# Patient Record
Sex: Male | Born: 1955
Health system: Southern US, Community
[De-identification: ages and names within clinical notes are randomized; demographics above are authoritative.]

## PROBLEM LIST (undated history)

## (undated) DIAGNOSIS — M51369 Other intervertebral disc degeneration, lumbar region without mention of lumbar back pain or lower extremity pain: Secondary | ICD-10-CM

## (undated) DIAGNOSIS — M5136 Other intervertebral disc degeneration, lumbar region: Secondary | ICD-10-CM

## (undated) DIAGNOSIS — G4733 Obstructive sleep apnea (adult) (pediatric): Secondary | ICD-10-CM

## (undated) DIAGNOSIS — I499 Cardiac arrhythmia, unspecified: Secondary | ICD-10-CM

## (undated) DIAGNOSIS — I8290 Acute embolism and thrombosis of unspecified vein: Secondary | ICD-10-CM

## (undated) DIAGNOSIS — I4891 Unspecified atrial fibrillation: Secondary | ICD-10-CM

## (undated) DIAGNOSIS — T8859XA Other complications of anesthesia, initial encounter: Secondary | ICD-10-CM

## (undated) DIAGNOSIS — G709 Myoneural disorder, unspecified: Secondary | ICD-10-CM

## (undated) DIAGNOSIS — G4730 Sleep apnea, unspecified: Secondary | ICD-10-CM

## (undated) DIAGNOSIS — E0500 Thyrotoxicosis with diffuse goiter without thyrotoxic crisis or storm: Secondary | ICD-10-CM

## (undated) DIAGNOSIS — Z86010 Personal history of colonic polyps: Secondary | ICD-10-CM

## (undated) DIAGNOSIS — M5126 Other intervertebral disc displacement, lumbar region: Secondary | ICD-10-CM

## (undated) DIAGNOSIS — K219 Gastro-esophageal reflux disease without esophagitis: Secondary | ICD-10-CM

## (undated) DIAGNOSIS — L0390 Cellulitis, unspecified: Secondary | ICD-10-CM

## (undated) DIAGNOSIS — E119 Type 2 diabetes mellitus without complications: Secondary | ICD-10-CM

## (undated) DIAGNOSIS — I8393 Asymptomatic varicose veins of bilateral lower extremities: Secondary | ICD-10-CM

## (undated) DIAGNOSIS — E0590 Thyrotoxicosis, unspecified without thyrotoxic crisis or storm: Secondary | ICD-10-CM

## (undated) DIAGNOSIS — E785 Hyperlipidemia, unspecified: Secondary | ICD-10-CM

## (undated) DIAGNOSIS — M1990 Unspecified osteoarthritis, unspecified site: Secondary | ICD-10-CM

## (undated) DIAGNOSIS — I10 Essential (primary) hypertension: Secondary | ICD-10-CM

## (undated) DIAGNOSIS — R7303 Prediabetes: Secondary | ICD-10-CM

## (undated) DIAGNOSIS — H269 Unspecified cataract: Secondary | ICD-10-CM

## (undated) HISTORY — DX: Personal history of colonic polyps: Z86.010

## (undated) HISTORY — PX: LUMBAR EPIDURAL INJECTION: SHX1980

## (undated) HISTORY — DX: Type 2 diabetes mellitus without complications: E11.9

## (undated) HISTORY — DX: Other intervertebral disc degeneration, lumbar region without mention of lumbar back pain or lower extremity pain: M51.369

## (undated) HISTORY — DX: Unspecified atrial fibrillation: I48.91

## (undated) HISTORY — DX: Thyrotoxicosis, unspecified without thyrotoxic crisis or storm: E05.90

## (undated) HISTORY — DX: Unspecified cataract: H26.9

## (undated) HISTORY — DX: Essential (primary) hypertension: I10

## (undated) HISTORY — DX: Acute embolism and thrombosis of unspecified vein: I82.90

## (undated) HISTORY — DX: Thyrotoxicosis with diffuse goiter without thyrotoxic crisis or storm: E05.00

## (undated) HISTORY — DX: Hyperlipidemia, unspecified: E78.5

## (undated) HISTORY — DX: Obstructive sleep apnea (adult) (pediatric): G47.33

## (undated) HISTORY — DX: Cellulitis, unspecified: L03.90

## (undated) HISTORY — PX: SPINE SURGERY: SHX786

## (undated) HISTORY — DX: Unspecified osteoarthritis, unspecified site: M19.90

## (undated) HISTORY — DX: Other intervertebral disc degeneration, lumbar region: M51.36

## (undated) HISTORY — DX: Sleep apnea, unspecified: G47.30

## (undated) HISTORY — DX: Other intervertebral disc displacement, lumbar region: M51.26

---

## 1993-11-08 DIAGNOSIS — I8290 Acute embolism and thrombosis of unspecified vein: Secondary | ICD-10-CM

## 1993-11-08 HISTORY — DX: Acute embolism and thrombosis of unspecified vein: I82.90

## 1998-08-21 ENCOUNTER — Encounter: Payer: Self-pay | Admitting: Neurosurgery

## 1998-08-21 ENCOUNTER — Ambulatory Visit (HOSPITAL_COMMUNITY): Admission: RE | Admit: 1998-08-21 | Discharge: 1998-08-21 | Payer: Self-pay | Admitting: Neurosurgery

## 2001-01-29 ENCOUNTER — Ambulatory Visit (HOSPITAL_COMMUNITY): Admission: RE | Admit: 2001-01-29 | Discharge: 2001-01-29 | Payer: Self-pay | Admitting: Neurosurgery

## 2001-01-29 ENCOUNTER — Encounter: Payer: Self-pay | Admitting: Neurosurgery

## 2002-01-15 ENCOUNTER — Encounter: Admission: RE | Admit: 2002-01-15 | Discharge: 2002-01-15 | Payer: Self-pay | Admitting: Orthopaedic Surgery

## 2002-01-15 ENCOUNTER — Encounter: Payer: Self-pay | Admitting: Orthopaedic Surgery

## 2002-02-15 ENCOUNTER — Inpatient Hospital Stay (HOSPITAL_COMMUNITY): Admission: RE | Admit: 2002-02-15 | Discharge: 2002-02-22 | Payer: Self-pay | Admitting: Orthopaedic Surgery

## 2002-02-22 ENCOUNTER — Encounter: Payer: Self-pay | Admitting: *Deleted

## 2002-06-08 ENCOUNTER — Ambulatory Visit (HOSPITAL_COMMUNITY): Admission: RE | Admit: 2002-06-08 | Discharge: 2002-06-08 | Payer: Self-pay | Admitting: *Deleted

## 2002-06-08 ENCOUNTER — Emergency Department (HOSPITAL_COMMUNITY): Admission: EM | Admit: 2002-06-08 | Discharge: 2002-06-08 | Payer: Self-pay | Admitting: *Deleted

## 2002-06-08 ENCOUNTER — Encounter: Payer: Self-pay | Admitting: *Deleted

## 2002-09-28 ENCOUNTER — Encounter: Admission: RE | Admit: 2002-09-28 | Discharge: 2002-09-28 | Payer: Self-pay | Admitting: Family Medicine

## 2002-09-28 ENCOUNTER — Encounter: Payer: Self-pay | Admitting: Family Medicine

## 2003-12-26 ENCOUNTER — Encounter: Admission: RE | Admit: 2003-12-26 | Discharge: 2003-12-26 | Payer: Self-pay | Admitting: Family Medicine

## 2010-11-08 DIAGNOSIS — L0390 Cellulitis, unspecified: Secondary | ICD-10-CM

## 2010-11-08 HISTORY — DX: Cellulitis, unspecified: L03.90

## 2013-08-17 ENCOUNTER — Other Ambulatory Visit (HOSPITAL_BASED_OUTPATIENT_CLINIC_OR_DEPARTMENT_OTHER): Payer: Self-pay | Admitting: Internal Medicine

## 2013-08-17 ENCOUNTER — Ambulatory Visit (HOSPITAL_BASED_OUTPATIENT_CLINIC_OR_DEPARTMENT_OTHER)
Admission: RE | Admit: 2013-08-17 | Discharge: 2013-08-17 | Disposition: A | Discharge status: Home / Self Care | Payer: 59 | Source: Ambulatory Visit | Attending: Internal Medicine | Admitting: Internal Medicine

## 2013-08-17 DIAGNOSIS — Z87891 Personal history of nicotine dependence: Secondary | ICD-10-CM | POA: Insufficient documentation

## 2013-08-17 DIAGNOSIS — R0602 Shortness of breath: Secondary | ICD-10-CM | POA: Insufficient documentation

## 2016-10-04 ENCOUNTER — Encounter: Payer: Self-pay | Admitting: Internal Medicine

## 2016-10-04 ENCOUNTER — Ambulatory Visit (INDEPENDENT_AMBULATORY_CARE_PROVIDER_SITE_OTHER): Payer: No Typology Code available for payment source | Admitting: Internal Medicine

## 2016-10-04 DIAGNOSIS — E0500 Thyrotoxicosis with diffuse goiter without thyrotoxic crisis or storm: Secondary | ICD-10-CM | POA: Insufficient documentation

## 2016-10-04 DIAGNOSIS — E0590 Thyrotoxicosis, unspecified without thyrotoxic crisis or storm: Secondary | ICD-10-CM | POA: Diagnosis not present

## 2016-10-04 LAB — T3, FREE: T3 FREE: 5.9 pg/mL — AB (ref 2.3–4.2)

## 2016-10-04 LAB — T4, FREE: FREE T4: 1.89 ng/dL — AB (ref 0.60–1.60)

## 2016-10-04 LAB — TSH: TSH: 0.01 u[IU]/mL — AB (ref 0.35–4.50)

## 2016-10-04 MED ORDER — METHIMAZOLE 10 MG PO TABS: 10.0000 mg | ORAL_TABLET | Freq: Two times a day (BID) | ORAL | 2 refills | Status: DC

## 2016-10-04 NOTE — Patient Instructions (Addendum)
Please stop at the lab.  Please continue Methimazole 10 mg 2x a day.  Please stop the Methimazole (Tapazole) and call us or your primary care doctor if you develop: - sore throat - fever - yellow skin - dark urine - light colored stools As we will then need to check your blood counts and liver tests.  Please try to join MyChart for easier communication.  Please return in 3 months with your sugar log.    Hyperthyroidism Hyperthyroidism is when the thyroid is too active (overactive). Your thyroid is a large gland that is located in your neck. The thyroid helps to control how your body uses food (metabolism). When your thyroid is overactive, it produces too much of a hormone called thyroxine. What are the causes? Causes of hyperthyroidism may include:  Graves disease. This is when your immune system attacks the thyroid gland. This is the most common cause.  Inflammation of the thyroid gland.  Tumor in the thyroid gland or somewhere else.  Excessive use of thyroid medicines, including:  Prescription thyroid supplement.  Herbal supplements that mimic thyroid hormones.  Solid or fluid-filled lumps within your thyroid gland (thyroid nodules).  Excessive ingestion of iodine. What increases the risk?  Being male.  Having a family history of thyroid conditions. What are the signs or symptoms? Signs and symptoms of hyperthyroidism may include:  Nervousness.  Inability to tolerate heat.  Unexplained weight loss.  Diarrhea.  Change in the texture of hair or skin.  Heart skipping beats or making extra beats.  Rapid heart rate.  Loss of menstruation.  Shaky hands.  Fatigue.  Restlessness.  Increased appetite.  Sleep problems.  Enlarged thyroid gland or nodules. How is this diagnosed? Diagnosis of hyperthyroidism may include:  Medical history and physical exam.  Blood tests.  Ultrasound tests. How is this treated? Treatment may include:  Medicines  to control your thyroid.  Surgery to remove your thyroid.  Radiation therapy. Follow these instructions at home:  Take medicines only as directed by your health care provider.  Do not use any tobacco products, including cigarettes, chewing tobacco, or electronic cigarettes. If you need help quitting, ask your health care provider.  Do not exercise or do physical activity until your health care provider approves.  Keep all follow-up appointments as directed by your health care provider. This is important. Contact a health care provider if:  Your symptoms do not get better with treatment.  You have fever.  You are taking thyroid replacement medicine and you:  Have depression.  Feel mentally and physically slow.  Have weight gain. Get help right away if:  You have decreased alertness or a change in your awareness.  You have abdominal pain.  You feel dizzy.  You have a rapid heartbeat.  You have an irregular heartbeat. This information is not intended to replace advice given to you by your health care provider. Make sure you discuss any questions you have with your health care provider. Document Released: 10/25/2005 Document Revised: 03/25/2016 Document Reviewed: 03/12/2014 Elsevier Interactive Patient Education  2017 [employer].

## 2016-10-04 NOTE — Progress Notes (Signed)
Patient ID: Jeffrey Price, male   DOB: [birth year], 60 y.o.   MRN: [id number]    HPI  Jeffrey Price is a 60 y.o.-year-old male, referred by his PCP, Dr. Antonietta Jewel ([place], [place]), for evaluation for thyrotoxicosis.  Pt describes he started to lose weight ~3 mo ago. He had insomnia, increased sweating, fatigue, joint pain. He went for an APE and a TSH was undetectable. He returned after a week and a TSH was again undetectable while the fT4 was high.   I reviewed pt's thyroid tests: 09/23/2016: TSH <0.01, fT4 3.4 (0.8-1.8) 09/17/2016: TSH <0.01   He was started on MMI 10 mg 2x a day on 09/24/2016. He was on Atenolol before for HTN, now increased to 100 mg daily. He is now starting to feel better.   Pt denies feeling nodules in neck, hoarseness, dysphagia/odynophagia, SOB with lying down; he c/o: - + fatigue - + excessive sweating/heat intolerance - + aches and pains - + leg swelling - + tremors - + anxiety - + palpitations - no hyperdefecation - + weight loss - 20-25 lbs in 3 mo  Pt does have a FH of thyroid ds.: mother - Graves ds (had RAI tx). No FH of thyroid cancer. No h/o radiation tx to head or neck.  No seaweed or kelp, no recent contrast studies. No steroid use. No herbal supplements. No Biotin use.  Pt. also has a history of HTN.  ROS: Constitutional: + see HPI, + poor sleep Eyes: no blurry vision, no xerophthalmia ENT: no sore throat, no nodules palpated in throat, no dysphagia/odynophagia, + hoarseness Cardiovascular: no CP/SOB/+ palpitations/+ leg swelling Respiratory: no cough/SOB Gastrointestinal: no N/V/D/C Musculoskeletal: + muscle/+ joint aches Skin: no rashes Neurological: + tremors/no numbness/tingling/dizziness Psychiatric: no depression/+anxiety + diff with erections, + low libido  Past Medical History:  Diagnosis Date  . Thyroid disease    HTN  No past surgical history on file.   Social History   Social History  . Marital status: widowed     Spouse name: N/A  . Number of children: 1   Occupational History  . Truck driver   Social History Main Topics  . Smoking status: Former smoker, quit 2013  . Smokeless tobacco: No  . Alcohol use Bourbon, socially, 2-3 drinks  . Drug use: No     Name Dose, Frequency               meloxicam (MOBIC) 7.5 MG tablet 7.5 mg, Daily       Summary: Take 7.5 mg by mouth daily.          Note (10/04/2016): Received from: [place]: Take 7.5 mg by mouth daily.         furosemide (LASIX) 40 MG tablet 40 mg, Daily       Summary: Take 40 mg by mouth daily.          Note (10/04/2016): Received from: [place]: Take 40 mg by mouth daily.         cyclobenzaprine (FLEXERIL) 10 MG tablet 10 mg, Daily       Summary: Take 10 mg by mouth daily.          Note (10/04/2016): Received from: [place]: Take 10 mg by mouth daily.         atenolol (TENORMIN) 25 MG tablet 25 mg, Daily       Summary: Take 25 mg by mouth daily.  Note (10/04/2016): Received from: [place]: Take 25 mg by mouth daily.         aspirin 81 MG chewable tablet 81 mg, Daily       Summary: Chew 81 mg by mouth daily.          Note (10/04/2016): Received from: [place]: Chew 81 mg by mouth daily.         ALPRAZolam (XANAX) 0.5 MG tablet 0.5 mg, Daily       Summary: Take 0.5 mg by mouth daily. Takes it at night for sleep          Note (10/04/2016): Received from: [place]: Take 0.5 mg by mouth at bedtime as needed for Sleep.         +  Methimazole 5 mg bid + Fish Oil + Zinc  No Known Allergies   Family History  Problem Relation Age of Onset  . Parkinson's disease Father   . Thyroid disease Mother    Diabetes - daughter  PE: BP (!) 142/80   Pulse 71   Wt 244 lb (110.7 kg)   SpO2 98%  Wt Readings from Last 3 Encounters:  10/04/16 244 lb (110.7 kg)   Constitutional: overweight, in  NAD Eyes: PERRLA, EOMI, no exophthalmos, no lid lag, no stare ENT: moist mucous membranes, no thyromegaly, no thyroid bruits, no cervical lymphadenopathy Cardiovascular: RRR, No MRG Respiratory: CTA B Gastrointestinal: abdomen soft, NT, ND, BS+ Musculoskeletal: no deformities, strength intact in all 4 Skin: moist, warm, no rashes Neurological: no tremor with outstretched hands, DTR normal in all 4  ASSESSMENT: 1. Thyrotoxicosis  PLAN:  1. Patient with a recently found low TSH + high fT4, with multiple thyrotoxic sxs: weight loss, heat intolerance, fatigue, tremors palpitations, anxiety. Now all have improved after starting methimazole and increasing the atenolol. In fact, on today's exam, he appears completely euthyroid. - he does not appear to have exogenous causes for the low TSH.  - We discussed that possible causes of thyrotoxicosis are:  Graves ds   Thyroiditis toxic multinodular goiter/ toxic adenoma (I cannot feel nodules at palpation of his thyroid). - I suggested that we check the TSH, fT3 and fT4 and also add thyroid stimulating antibodies to screen for Graves' disease.  - If the tests remain abnormal, we may need an uptake and scan to differentiate between the 3 above possible etiologies, although my suspicion is quite high for Graves' disease, especially since he has family history of this condition in his mother. - we discussed about possible modalities of treatment for the above conditions, to include methimazole use, radioactive iodine ablation or (last resort) surgery. - we might need to do thyroid ultrasound depending on the results of the uptake and scan (if a cold nodule is present) - Will continue his beta blocker at the current dose for now - I advised him to join my chart to communicate easier - RTC in 3 months, but likely sooner for repeat labs  Component     Latest Ref Rng & Units 10/04/2016  T4,Free(Direct)     0.60 - 1.60 ng/dL 1.89 (H)   Triiodothyronine,Free,Serum     2.3 - 4.2 pg/mL 5.9 (H)  TSH     0.35 - 4.50 uIU/mL 0.01 (L)  TSI     <140 % baseline 553 (H)   TSI's are high, indicating Graves' disease.  TFTs are still abnormal, therefore, will continue his current dose of methimazole, 10 mg twice  a day until we repeat the labs in 4 weeks. At that point, I suspect that we may be able to reduce the dose to 5 mg twice a day.  Philemon Kingdom, MD PhD [facility] Endocrinology

## 2016-10-06 LAB — THYROID STIMULATING IMMUNOGLOBULIN: TSI: 553 % baseline — ABNORMAL HIGH (ref <140)

## 2016-10-07 ENCOUNTER — Encounter: Payer: Self-pay | Admitting: Internal Medicine

## 2016-10-21 ENCOUNTER — Other Ambulatory Visit: Payer: Self-pay | Admitting: Internal Medicine

## 2016-10-21 ENCOUNTER — Encounter: Payer: Self-pay | Admitting: Internal Medicine

## 2016-10-21 ENCOUNTER — Other Ambulatory Visit (INDEPENDENT_AMBULATORY_CARE_PROVIDER_SITE_OTHER): Payer: No Typology Code available for payment source

## 2016-10-21 DIAGNOSIS — E0590 Thyrotoxicosis, unspecified without thyrotoxic crisis or storm: Secondary | ICD-10-CM

## 2016-10-21 LAB — T4, FREE: Free T4: 1.03 ng/dL (ref 0.60–1.60)

## 2016-10-21 LAB — TSH: TSH: 0.3 u[IU]/mL — ABNORMAL LOW (ref 0.35–4.50)

## 2016-10-21 LAB — T3, FREE: T3, Free: 3.9 pg/mL (ref 2.3–4.2)

## 2016-10-22 MED ORDER — METHIMAZOLE 5 MG PO TABS: 5.0000 mg | ORAL_TABLET | Freq: Two times a day (BID) | ORAL | 2 refills | Status: DC

## 2016-12-17 ENCOUNTER — Encounter: Payer: Self-pay | Admitting: Internal Medicine

## 2016-12-17 NOTE — Progress Notes (Unsigned)
Received labs from PCP from 12/09/2016: TSH 0.02, free T4 1.1 (0.8-1.8), free T3 4.2 (2.3-4.2) The free hormones are much improved, being now in the normal range. The TSH is still low, however, this lags behind the rest of the tests. I would suggest to continue the current dose of methimazole and I will repeat his tests in approximately 1 month.

## 2017-01-03 ENCOUNTER — Encounter: Payer: Self-pay | Admitting: Internal Medicine

## 2017-01-03 ENCOUNTER — Ambulatory Visit (INDEPENDENT_AMBULATORY_CARE_PROVIDER_SITE_OTHER): Payer: No Typology Code available for payment source | Admitting: Internal Medicine

## 2017-01-03 VITALS — BP 134/80 | HR 68 | Wt 257.0 lb

## 2017-01-03 DIAGNOSIS — E0500 Thyrotoxicosis with diffuse goiter without thyrotoxic crisis or storm: Secondary | ICD-10-CM | POA: Diagnosis not present

## 2017-01-03 DIAGNOSIS — E0590 Thyrotoxicosis, unspecified without thyrotoxic crisis or storm: Secondary | ICD-10-CM

## 2017-01-03 LAB — TSH: TSH: 0.18 u[IU]/mL — AB (ref 0.35–4.50)

## 2017-01-03 LAB — T3, FREE: T3 FREE: 3.3 pg/mL (ref 2.3–4.2)

## 2017-01-03 LAB — T4, FREE: FREE T4: 0.75 ng/dL (ref 0.60–1.60)

## 2017-01-03 NOTE — Patient Instructions (Signed)
Please stop at the lab.  Please continue Methimazole 5 mg 2x a day.  Please return in 4 months.  

## 2017-01-03 NOTE — Progress Notes (Signed)
Patient ID: Jeffrey Price, male   DOB: 1956-10-09, 61 y.o.   MRN: [id number]    HPI  Jeffrey Price is a 61 y.o.-year-old male, initially referred by his PCP, Dr. Antonietta Jewel ([place], [place]), now returning for f/u for thyrotoxicosis (most likely Graves ds). Last visit 3 mo ago.  Reviewed and addended hx: Pt describes he started to lose weight ~3 mo ago. He had insomnia, increased sweating, fatigue, joint pain. He went for an APE and a TSH was undetectable. He returned after a week and a TSH was again undetectable while the fT4 was high:  09/23/2016: TSH <0.01, fT4 3.4 (0.8-1.8) 09/17/2016: TSH <0.01   Before our visit in 09/2016, he was started on MMI 10 mg 2x a day on 09/24/2016. He was started on Atenolol before for HTN, but this was increased to 100 mg daily.  He started HCTZ 11/2016.  At last visit: Component     Latest Ref Rng & Units 10/04/2016  T4,Free(Direct)     0.60 - 1.60 ng/dL 1.89 (H)  Triiodothyronine,Free,Serum     2.3 - 4.2 pg/mL 5.9 (H)  TSH     0.35 - 4.50 uIU/mL 0.01 (L)  TSI     <140 % baseline 553 (H)  TSI's are high, indicating Graves' disease. We continued the same dose of MMI, 10 mg bid.  In 10/2016, tests were improving: Component     Latest Ref Rng & Units 10/21/2016  TSH     0.35 - 4.50 uIU/mL 0.30 (L)  T4,Free(Direct)     0.60 - 1.60 ng/dL 1.03  Triiodothyronine,Free,Serum     2.3 - 4.2 pg/mL 3.9  We decreased MMI to 5 mg bid.  Since then: Received labs from PCP from 12/09/2016: TSH 0.02, free T4 1.1 (0.8-1.8), free T3 4.2 (2.3-4.2) The free hormones = much improved, being now in the normal range. The TSH = still low, however, this lags behind the rest of the tests. We continued MMI 5 mg bid.  Pt denies feeling nodules in neck, hoarseness, dysphagia/odynophagia, SOB with lying down; he c/o: - + fatigue - resolved excessive sweating/heat intolerance - + leg swelling - no tremors - no anxiety - resolved palpitations - no hyperdefecation - +  weight gain - initially lost 25-30 lbs in 3 mo >> then gained 13 lbs  He started Amitriptyline for neuropathy.  Pt does have a FH of thyroid ds.: mother - Graves ds (had RAI tx). No FH of thyroid cancer. No h/o radiation tx to head or neck.  No seaweed or kelp, no recent contrast studies. No steroid use. No herbal supplements. No Biotin use.  Pt. also has a history of HTN.  ROS: Constitutional: + see HPI, no poor sleep Eyes: no blurry vision, no xerophthalmia ENT: no sore throat, no nodules palpated in throat, no dysphagia/odynophagia, + hoarseness Cardiovascular: no CP/SOB/palpitations/+ leg swelling Respiratory: no cough/SOB Gastrointestinal: no N/V/D/C Musculoskeletal: no muscle/joint aches Skin: no rashes Neurological: no tremors/+ numbness - feet /no tingling/dizziness + diff with erections, + low libido  I reviewed pt's medications, allergies, PMH, social hx, family hx, and changes were documented in the history of present illness. Otherwise, unchanged from my initial visit note.  Past Medical History:  Diagnosis Date  . Thyroid disease    HTN  No past surgical history on file.   Social History   Social History  . Marital status: widowed    Spouse name: N/A  . Number of children: 1   Occupational History  .  Truck driver   Social History Main Topics  . Smoking status: Former smoker, quit 2013  . Smokeless tobacco: No  . Alcohol use [place]bon, socially, 2-3 drinks  . Drug use: No     Name Dose, Frequency               meloxicam (MOBIC) 7.5 MG tablet 7.5 mg, Daily       Summary: Take 7.5 mg by mouth daily.          Note (10/04/2016): Received from: [place]: Take 7.5 mg by mouth daily.         furosemide (LASIX) 40 MG tablet 40 mg, Daily       Summary: Take 40 mg by mouth daily.          Note (10/04/2016): Received from: [place]: Take 40 mg by mouth daily.         cyclobenzaprine (FLEXERIL) 10 MG tablet 10 mg,  Daily       Summary: Take 10 mg by mouth daily.          Note (10/04/2016): Received from: [place]: Take 10 mg by mouth daily.         atenolol (TENORMIN) 25 MG tablet 25 mg, Daily       Summary: Take 25 mg by mouth daily.          Note (10/04/2016): Received from: [place]: Take 25 mg by mouth daily.         aspirin 81 MG chewable tablet 81 mg, Daily       Summary: Chew 81 mg by mouth daily.          Note (10/04/2016): Received from: [place]: Chew 81 mg by mouth daily.         ALPRAZolam (XANAX) 0.5 MG tablet 0.5 mg, Daily       Summary: Take 0.5 mg by mouth daily. Takes it at night for sleep          Note (10/04/2016): Received from: [place]: Take 0.5 mg by mouth at bedtime as needed for Sleep.         +  Methimazole 5 mg bid + Fish Oil + Zinc  No Known Allergies   Family History  Problem Relation Age of Onset  . Parkinson's disease Father   . Thyroid disease Mother    Diabetes - daughter  PE: BP 134/80 (BP Location: Left Arm, Patient Position: Sitting)   Pulse 68   Wt 257 lb (116.6 kg)   SpO2 95%  Wt Readings from Last 3 Encounters:  01/03/17 257 lb (116.6 kg)  10/04/16 244 lb (110.7 kg)   Constitutional: overweight, in NAD Eyes: PERRLA, EOMI, no exophthalmos, no lid lag, no stare ENT: moist mucous membranes, no thyromegaly, no cervical lymphadenopathy Cardiovascular: RRR, No MRG Respiratory: CTA B Gastrointestinal: abdomen soft, NT, ND, BS+ Musculoskeletal: no deformities, strength intact in all 4 Skin: moist, warm, no rashes Neurological: no tremor with outstretched hands, DTR normal in all 4  ASSESSMENT: 1. Graves ds  PLAN:  1. Patient with a <1 year h/o TSH + high fT4, initially multiple thyrotoxic sxs: weight loss, heat intolerance, fatigue, tremors palpitations, anxiety, now all have improved after starting methimazole and increasing the atenolol. He still has  fatigue, but improved. He also gained weight since last visit, which points towards resolution of his thyrotoxicosis. - at last visit, we checked his TSI ab's >>  elevated >> confirming Graves ds. - we skipped an uptake and scan as the suspicion for Graves' disease was high, especially since he has family history of this condition in his mother and his TSIs were elevated. He also responded well to [place]. He was initially started on 10 mg bid, which we then decreased to 5 mg bid. He continues on this dose. No hyperthyroid sxs. - we discussed about possible modalities of treatment for Graves, to include continuing methimazole use, radioactive iodine ablation or (last resort) surgery. As he is responding well to [place] >> will continue - Will continue his beta blocker at the current dose for now, but his pulse is low >> may decrease the dose if only needed for BP management - check TFTs today (3.5 weeks from prior labs) as he is going on a cruise later in the week - RTC in 4 months, but likely sooner for repeat labs  Office Visit on 01/03/2017  Component Date Value Ref Range Status  . Free T4 01/03/2017 0.75  0.60 - 1.60 ng/dL Final   Comment: Specimens from patients who are undergoing biotin therapy and /or ingesting biotin supplements may contain high levels of biotin.  The higher biotin concentration in these specimens interferes with this Free T4 assay.  Specimens that contain high levels  of biotin may cause false high results for this Free T4 assay.  Please interpret results in light of the total clinical presentation of the patient.    . T3, Free 01/03/2017 3.3  2.3 - 4.2 pg/mL Final  . TSH 01/03/2017 0.18* 0.35 - 4.50 uIU/mL Final   Free hormones are great. TSH still slightly low >> will decrease the dose to 5 mg MMI daily >> recheck labs in 5 weeks.  Philemon Kingdom, MD PhD [facility] Endocrinology

## 2017-01-04 MED ORDER — METHIMAZOLE 5 MG PO TABS: 5.0000 mg | ORAL_TABLET | Freq: Every day | ORAL | 2 refills | Status: DC

## 2017-02-18 ENCOUNTER — Other Ambulatory Visit (INDEPENDENT_AMBULATORY_CARE_PROVIDER_SITE_OTHER): Payer: No Typology Code available for payment source

## 2017-02-18 DIAGNOSIS — E0500 Thyrotoxicosis with diffuse goiter without thyrotoxic crisis or storm: Secondary | ICD-10-CM | POA: Diagnosis not present

## 2017-02-18 LAB — T4, FREE: FREE T4: 1.51 ng/dL (ref 0.60–1.60)

## 2017-02-18 LAB — TSH: TSH: <0.01 u[IU]/mL — ABNORMAL LOW (ref 0.35–4.50)

## 2017-02-18 LAB — T3, FREE: T3, Free: 5.8 pg/mL — ABNORMAL HIGH (ref 2.3–4.2)

## 2017-02-21 MED ORDER — METHIMAZOLE 5 MG PO TABS: 5.0000 mg | ORAL_TABLET | Freq: Two times a day (BID) | ORAL | 2 refills | Status: DC

## 2017-04-14 ENCOUNTER — Other Ambulatory Visit (INDEPENDENT_AMBULATORY_CARE_PROVIDER_SITE_OTHER): Payer: No Typology Code available for payment source

## 2017-04-14 DIAGNOSIS — E0500 Thyrotoxicosis with diffuse goiter without thyrotoxic crisis or storm: Secondary | ICD-10-CM | POA: Diagnosis not present

## 2017-04-14 LAB — T3, FREE: T3, Free: 2.8 pg/mL (ref 2.3–4.2)

## 2017-04-14 LAB — T4, FREE: Free T4: 0.7 ng/dL (ref 0.60–1.60)

## 2017-04-14 LAB — TSH: TSH: 1.23 u[IU]/mL (ref 0.35–4.50)

## 2017-05-20 ENCOUNTER — Encounter: Payer: Self-pay | Admitting: Internal Medicine

## 2017-05-20 ENCOUNTER — Ambulatory Visit (INDEPENDENT_AMBULATORY_CARE_PROVIDER_SITE_OTHER): Payer: No Typology Code available for payment source | Admitting: Internal Medicine

## 2017-05-20 VITALS — BP 128/72 | HR 67 | Wt 268.0 lb

## 2017-05-20 DIAGNOSIS — E0500 Thyrotoxicosis with diffuse goiter without thyrotoxic crisis or storm: Secondary | ICD-10-CM | POA: Diagnosis not present

## 2017-05-20 LAB — T4, FREE: FREE T4: 0.61 ng/dL (ref 0.60–1.60)

## 2017-05-20 LAB — T3, FREE: T3, Free: 3.4 pg/mL (ref 2.3–4.2)

## 2017-05-20 LAB — TSH: TSH: 7.81 u[IU]/mL — AB (ref 0.35–4.50)

## 2017-05-20 MED ORDER — METHIMAZOLE 5 MG PO TABS: 5.0000 mg | ORAL_TABLET | Freq: Two times a day (BID) | ORAL | 2 refills | Status: DC

## 2017-05-20 MED ORDER — METHIMAZOLE 5 MG PO TABS: 5.0000 mg | ORAL_TABLET | Freq: Every day | ORAL | 2 refills | Status: DC

## 2017-05-20 NOTE — Patient Instructions (Signed)
Please stop at the lab.  Please continue Methimazole 5 mg 2x a day.  Please return in 4 months.  

## 2017-05-20 NOTE — Progress Notes (Signed)
Patient ID: Jeffrey Price, male   DOB: 1956-08-02, 61 y.o.   MRN: [id number]    HPI  Jeffrey Price is a 61 y.o.-year-old male, initially referred by his PCP, Dr. Antonietta Jewel ([place], ), returning for f/u for thyrotoxicosis (most likely Graves ds). Last visit 5 mo ago.  Reviewed and addended hx: Pt describes he started to lose weight ~3 mo ago. He had insomnia, increased sweating, fatigue, joint pain. He went for an APE and a TSH was undetectable. He returned after a week and a TSH was again undetectable while the fT4 was high:  09/23/2016: TSH <0.01, fT4 3.4 (0.8-1.8) 09/17/2016: TSH <0.01   Before our visit in 09/2016, he was started on MMI 10 mg 2x a day on 09/24/2016. He was started on Atenolol before for HTN, but this was increased to 100 mg daily.  We changed his MMI dose >> 5 mg daily in 12/2016. Subsequent TFTs were abnormal >> increased back to 5 mg 2x a day. Subsequent labs were normal.  Reviewed labs: Lab Results  Component Value Date   TSH 1.23 04/14/2017   TSH <0.01 (L) 02/18/2017   TSH 0.18 (L) 01/03/2017   TSH 0.30 (L) 10/21/2016   TSH 0.01 (L) 10/04/2016   FREET4 0.70 04/14/2017   FREET4 1.51 02/18/2017   FREET4 0.75 01/03/2017   FREET4 1.03 10/21/2016   FREET4 1.89 (H) 10/04/2016   T3FREE 2.8 04/14/2017   T3FREE 5.8 (H) 02/18/2017   T3FREE 3.3 01/03/2017   T3FREE 3.9 10/21/2016   T3FREE 5.9 (H) 10/04/2016   Lab Results  Component Value Date   TSI 553 (H) 10/04/2016   Also: 12/09/2016: TSH 0.02, free T4 1.1 (0.8-1.8), free T3 4.2 (2.3-4.2)  Pt c/o - + weight gain - initially lost 25-30 lbs in 3 mo with Graves ds. - + fatigue (improving) - heat intolerance - tremors - palpitations - anxiety - hyperdefecation - hair loss  Pt denies: - feeling nodules in neck - hoarseness - dysphagia - choking - SOB with lying down  Pt does have a FH of thyroid ds.: mother - Graves ds (had RAI tx). No FH of thyroid cancer. No h/o radiation tx to head or  neck.  No seaweed or kelp. No recent contrast studies. No herbal supplements. No Biotin use. No recent steroids use.   Pt. also has a history of HTN.  ROS: Constitutional: + see HPI Eyes: no blurry vision, no xerophthalmia ENT: no sore throat, + see HPI Cardiovascular: no CP/no SOB/no palpitations/+ leg swelling Respiratory: no cough/no SOB/no wheezing Gastrointestinal: no N/no V/no D/no C/no acid reflux Musculoskeletal: + muscle aches/+ joint aches Skin: no rashes, no hair loss Neurological: no tremors/no numbness/no tingling/no dizziness + low libido, pbs with erections  I reviewed pt's medications, allergies, PMH, social hx, family hx, and changes were documented in the history of present illness. Otherwise, unchanged from my initial visit note. Started Hydralazine and Doxycycline.   Past Medical History:  Diagnosis Date  . Thyroid disease    HTN  No past surgical history on file.   Social History   Social History  . Marital status: widowed    Spouse name: N/A  . Number of children: 1   Occupational History  . Truck driver   Social History Main Topics  . Smoking status: Former smoker, quit 2013  . Smokeless tobacco: No  . Alcohol use [place], socially, 2-3 drinks  . Drug use: No    Current Outpatient Prescriptions  Medication Sig  Dispense Refill  . ALPRAZolam (XANAX) 0.5 MG tablet Take 0.5 mg by mouth daily. Takes it at night for sleep    . aspirin 81 MG chewable tablet Chew 81 mg by mouth daily.    Marland Kitchen atenolol (TENORMIN) 25 MG tablet Take 100 mg by mouth daily.    . cyclobenzaprine (FLEXERIL) 10 MG tablet Take 10 mg by mouth daily.    Marland Kitchen doxycycline (ADOXA) 100 MG tablet Take 100 mg by mouth 2 (two) times daily.    . furosemide (LASIX) 40 MG tablet Take 40 mg by mouth daily.    . hydrALAZINE (APRESOLINE) 50 MG tablet Take 50 mg by mouth 3 (three) times daily.    . meloxicam (MOBIC) 7.5 MG tablet Take 7.5 mg by mouth daily.    . methimazole (TAPAZOLE) 5 MG  tablet Take 1 tablet (5 mg total) by mouth 2 (two) times daily. 60 tablet 2   No current facility-administered medications for this visit.    No Known Allergies   Family History  Problem Relation Age of Onset  . Parkinson's disease Father   . Thyroid disease Mother    Diabetes - daughter  PE: BP 128/72 (BP Location: Left Arm, Patient Position: Sitting)   Pulse 67   Wt 268 lb (121.6 kg)   SpO2 95%  Wt Readings from Last 3 Encounters:  05/20/17 268 lb (121.6 kg)  01/03/17 257 lb (116.6 kg)  10/04/16 244 lb (110.7 kg)   Constitutional: overweight, in NAD Eyes: PERRLA, EOMI, no exophthalmos ENT: moist mucous membranes, no thyromegaly, no cervical lymphadenopathy Cardiovascular: RRR, No MRG Respiratory: CTA B Gastrointestinal: abdomen soft, NT, ND, BS+ Musculoskeletal: no deformities, strength intact in all 4 Skin: moist, warm, no rashes Neurological: no tremor with outstretched hands, DTR normal in all 4  ASSESSMENT: 1. Graves ds  PLAN:  1. Patient with a h/o TSH + high fT4 + high TSIs, initially with weight loss, tremors, palpitations, anxiety, all resolved after starting MMI. We tried to decrease the dose to once a day in 12/2016, but his TSH decreased >> increased back to 5 mg 2x a day, which he continues today. Last TFT set normal 04/2017.  - we skipped an uptake and scan as the suspicion for Graves' disease was high, especially since he has family history of this condition in his mother and his TSIs were elevated.  - will check TFTs today and see if we can decrease the MMI, but we can stay on this dose a little - will continue his beta blocker at the current dose for now, but may need to decrease soon - RTC in 4 mo but possibly sooner for labs  Component     Latest Ref Rng & Units 05/20/2017  T4,Free(Direct)     0.60 - 1.60 ng/dL 0.61  Triiodothyronine,Free,Serum     2.3 - 4.2 pg/mL 3.4  TSH     0.35 - 4.50 uIU/mL 7.81 (H)   TSH has increased. Will decrease the  methimazole to 5 mg daily and recheck his labs in 1 month.  Philemon Kingdom, MD PhD [facility] Endocrinology

## 2017-06-22 ENCOUNTER — Other Ambulatory Visit (INDEPENDENT_AMBULATORY_CARE_PROVIDER_SITE_OTHER): Payer: No Typology Code available for payment source

## 2017-06-22 DIAGNOSIS — E0500 Thyrotoxicosis with diffuse goiter without thyrotoxic crisis or storm: Secondary | ICD-10-CM | POA: Diagnosis not present

## 2017-06-22 LAB — T4, FREE: FREE T4: 1.06 ng/dL (ref 0.60–1.60)

## 2017-06-22 LAB — TSH: TSH: 0.17 u[IU]/mL — ABNORMAL LOW (ref 0.35–4.50)

## 2017-06-22 LAB — T3, FREE: T3 FREE: 3.8 pg/mL (ref 2.3–4.2)

## 2017-06-23 MED ORDER — METHIMAZOLE 5 MG PO TABS: ORAL_TABLET | ORAL | 2 refills | Status: DC

## 2017-09-08 ENCOUNTER — Encounter: Payer: Self-pay | Admitting: Internal Medicine

## 2017-09-19 ENCOUNTER — Encounter: Payer: Self-pay | Admitting: Internal Medicine

## 2017-09-19 ENCOUNTER — Ambulatory Visit (INDEPENDENT_AMBULATORY_CARE_PROVIDER_SITE_OTHER): Payer: No Typology Code available for payment source | Admitting: Internal Medicine

## 2017-09-19 VITALS — BP 122/84 | HR 81 | Temp 98.3°F | Resp 16 | Ht 74.0 in | Wt 273.2 lb

## 2017-09-19 DIAGNOSIS — E0500 Thyrotoxicosis with diffuse goiter without thyrotoxic crisis or storm: Secondary | ICD-10-CM

## 2017-09-19 DIAGNOSIS — R635 Abnormal weight gain: Secondary | ICD-10-CM | POA: Diagnosis not present

## 2017-09-19 LAB — TSH: TSH: 3.9 u[IU]/mL (ref 0.35–4.50)

## 2017-09-19 LAB — T4, FREE: Free T4: 0.76 ng/dL (ref 0.60–1.60)

## 2017-09-19 LAB — T3, FREE: T3 FREE: 3.1 pg/mL (ref 2.3–4.2)

## 2017-09-19 MED ORDER — ATENOLOL 100 MG PO TABS: 100.0000 mg | ORAL_TABLET | Freq: Every day | ORAL | 3 refills | Status: DC

## 2017-09-19 NOTE — Patient Instructions (Signed)
Please stop at the lab.  Continue Methimazole 5 mg in am and 2.5 mg in pm.  Please come back for a follow-up appointment in 6 months.

## 2017-09-19 NOTE — Progress Notes (Signed)
Patient ID: Jeffrey Price, male   DOB: 1956-10-26, 61 y.o.   MRN: [id number]    HPI  Jeffrey Price is a 61 y.o.-year-old male, initially referred by his PCP, Dr. Antonietta Price ([place], [place]), returning for f/u for thyrotoxicosis (most likely Graves ds). Last visit 4 mo ago.  Jeffrey Price has a LUQ AP x 6 mo >> has appt with GI in 10/2017.  Reviewed hx: Pt describes he started to lose weight ~3 mo ago. He had insomnia, increased sweating, fatigue, joint pain. He went for an APE and a TSH was undetectable. He returned after a week and a TSH was again undetectable while the fT4 was high:  09/23/2016: TSH <0.01, fT4 3.4 (0.8-1.8) 09/17/2016: TSH <0.01   Before our visit in 09/2016, he was started on MMI 10 mg 2x a day on 09/24/2016. He was started on Atenolol before for HTN, but this was increased to 100 mg daily.  We changed his MMI dose >> 5 mg daily in 12/2016 >> ... >> last change in dose: increased dose to 5 mg in am and 2.5 mg in pm (06/22/2017).  Reviewed labs: Lab Results  Component Value Date   TSH 0.17 (L) 06/22/2017   TSH 7.81 (H) 05/20/2017   TSH 1.23 04/14/2017   TSH <0.01 (L) 02/18/2017   TSH 0.18 (L) 01/03/2017   TSH 0.30 (L) 10/21/2016   TSH 0.01 (L) 10/04/2016   FREET4 1.06 06/22/2017   FREET4 0.61 05/20/2017   FREET4 0.70 04/14/2017   FREET4 1.51 02/18/2017   FREET4 0.75 01/03/2017   FREET4 1.03 10/21/2016   FREET4 1.89 (H) 10/04/2016   T3FREE 3.8 06/22/2017   T3FREE 3.4 05/20/2017   T3FREE 2.8 04/14/2017   T3FREE 5.8 (H) 02/18/2017   T3FREE 3.3 01/03/2017   T3FREE 3.9 10/21/2016   T3FREE 5.9 (H) 10/04/2016   Lab Results  Component Value Date   TSI 553 (H) 10/04/2016   Also: 12/09/2016: TSH 0.02, free T4 1.1 (0.8-1.8), free T3 4.2 (2.3-4.2)  Pt c/o - + wt gain - initially lost 25-30 lbs in 3 mo with Graves ds., but started to gain him back on methimazole  Pt denies: - feeling nodules in neck - hoarseness - dysphagia  - choking - SOB with lying down  Pt  does have a FH of thyroid ds.: mother - Graves ds (had RAI tx). No FH of thyroid cancer. No h/o radiation tx to head or neck.  No seaweed or kelp. No recent contrast studies. No herbal supplements. No Biotin use. No recent steroids use.   Pt. also has a history of HTN.  ROS: Constitutional: + weight gain/no weight loss, + fatigue, no subjective hyperthermia, no subjective hypothermia Eyes: no blurry vision, no xerophthalmia ENT: no sore throat, + see HPI Cardiovascular: no CP/no SOB/no palpitations/+ leg swelling Respiratory: no cough/no SOB/no wheezing Gastrointestinal: no N/no V/no D/no C/no acid reflux, + AP Musculoskeletal: + muscle aches/no joint aches Skin: no rashes, no hair loss Neurological: no tremors/no numbness/no tingling/no dizziness  I reviewed pt's medications, allergies, PMH, social hx, family hx, and changes were documented in the history of present illness. Otherwise, unchanged from my initial visit note.   Past Medical History:  Diagnosis Date  . Thyroid disease    HTN  No past surgical history on file.   Social History   Social History  . Marital status: widowed    Spouse name: N/A  . Number of children: 1   Occupational History  . Truck  driver   Social History Main Topics  . Smoking status: Former smoker, quit 2013  . Smokeless tobacco: No  . Alcohol use Bourbon, socially, 2-3 drinks  . Drug use: No    Current Outpatient Medications  Medication Sig Dispense Refill  . ALPRAZolam (XANAX) 0.5 MG tablet Take 0.5 mg by mouth daily. Takes it at night for sleep    . aspirin 81 MG chewable tablet Chew 81 mg by mouth daily.    Marland Kitchen atenolol (TENORMIN) 25 MG tablet Take 100 mg by mouth daily.    . cyclobenzaprine (FLEXERIL) 10 MG tablet Take 10 mg by mouth daily.    Marland Kitchen doxycycline (ADOXA) 100 MG tablet Take 100 mg by mouth 2 (two) times daily.    . furosemide (LASIX) 40 MG tablet Take 40 mg by mouth daily.    . hydrALAZINE (APRESOLINE) 50 MG tablet Take 50  mg by mouth 3 (three) times daily.    . meloxicam (MOBIC) 7.5 MG tablet Take 7.5 mg by mouth daily.    . methimazole (TAPAZOLE) 5 MG tablet Take 5 mg in a.m. and 2.5 mg in p.m. 90 tablet 2   No current facility-administered medications for this visit.    No Known Allergies   Family History  Problem Relation Age of Onset  . Parkinson's disease Father   . Thyroid disease Mother    Diabetes - daughter  PE: BP 122/84 (BP Location: Left Arm, Patient Position: Sitting, Cuff Size: Normal)   Pulse 81   Temp 98.3 F (36.8 C) (Oral)   Resp 16   Ht 6\' 2"  (1.88 m)   Wt 273 lb 4 oz (123.9 kg)   SpO2 92%   BMI 35.08 kg/m  Wt Readings from Last 3 Encounters:  09/19/17 273 lb 4 oz (123.9 kg)  05/20/17 268 lb (121.6 kg)  01/03/17 257 lb (116.6 kg)   Constitutional: overweight, in NAD Eyes: PERRLA, EOMI, no exophthalmos ENT: moist mucous membranes, no thyromegaly, no cervical lymphadenopathy Cardiovascular: RRR, No MRG Respiratory: CTA B Gastrointestinal: abdomen soft, NT, ND, BS+ Musculoskeletal: no deformities, strength intact in all 4 Skin: moist, warm, no rashes Neurological: no tremor with outstretched hands, DTR normal in all 4  ASSESSMENT: 1. Graves ds  2.  Weight gain  PLAN:  1. Patient with a h/o thyrotoxicosis, on MMI.  Since last visit, we tried to decrease the dose to once a day but we had to increase it to 5 mg in a.m. and 2.5 mg in p.m. as TSH became suppressed on only 5 mg a day.  He is asymptomatic and only complains about abdominal pain (but this has been present for the last 6 months), and weight gain (~5 pounds since last visit). - His methimazole dose was increased to 06/2017 and he is due for labs today.  We will check a TSH, free T4, free T3 and will also add TSI antibodies to check the activity of his Graves' disease. - we skipped an uptake and scan as the suspicion for Graves' disease was high, especially since he has family history of this condition in his  mother and his PSIs were elevated. - will continue his beta blocker at the current dose for now: 100 mg daily - RTC in 6 months, but likely sooner for labs  2. Weight gain - Worsening - He lost a significant amount of weight Graves' disease and now started to gain weight back (gained approximately 15 pounds in the last 8 months) - We discussed that  this is a good sign that his Graves' disease is improving - reassured the patient   - I am wondering whether the metoprolol is not contributing to his weight gain >> we may need to try to decrease this at next visit  Office Visit on 09/19/2017  Component Date Value Ref Range Status  . TSH 09/19/2017 3.90  0.35 - 4.50 uIU/mL Final  . Free T4 09/19/2017 0.76  0.60 - 1.60 ng/dL Final   Comment: Specimens from patients who are undergoing biotin therapy and /or ingesting biotin supplements may contain high levels of biotin.  The higher biotin concentration in these specimens interferes with this Free T4 assay.  Specimens that contain high levels  of biotin may cause false high results for this Free T4 assay.  Please interpret results in light of the total clinical presentation of the patient.    . T3, Free 09/19/2017 3.1  2.3 - 4.2 pg/mL Final  . TSI 09/19/2017 311* <140 % baseline Final   Comment: . Thyroid stimulating immunoglobulins (TSI) can engage the TSH receptors resulting in hyperthyroidism in Graves' disease patients. TSI levels can be useful in monitoring the clinical outcome of Graves' disease as well as assessing the potential for hyperthyroidism from maternal-fetal transfer. TSI results greater than or equal to (>=) 140% of the Reference Control are considered positive. Marland Kitchen NOTE: A serum TSH level greater than 350 micro-International Units/mL can interfere with the TSI bioassay and potentially give false positive results. . Patients who are pregnant and are suspected of having hyperthyroidism should have both TSI and  human Chorionic Gonadotropin(hCG) tests measured. A serum hCG level greater than 40,625 mIU/mL can interfere with the TSI bioassay and may give false negative results. In these patients it is recommended that a second TSI be obtained when the hCG concentration falls below 40,625 mIU/mL (usually after approximately 20-weeks gestation)                          . . The analytical performance characteristics of this assay have been determined by [facility], [place], [place]. The modifications have not been cleared or approved by the FDA. This assay has been validated pursuant to the CLIA regulations and is used for clinical purposes. Marland Kitchen    TFTs are normal in TSI's have improved. We will decrease the methimazole dose to 5 mg daily and repeat his testing 1.5 months.  Philemon Kingdom, MD PhD [facility]

## 2017-09-21 ENCOUNTER — Encounter: Payer: Self-pay | Admitting: Internal Medicine

## 2017-09-22 LAB — THYROID STIMULATING IMMUNOGLOBULIN: TSI: 311 % baseline — ABNORMAL HIGH (ref <140)

## 2017-09-22 MED ORDER — METHIMAZOLE 5 MG PO TABS: ORAL_TABLET | ORAL | 5 refills | Status: DC

## 2017-11-02 ENCOUNTER — Encounter: Payer: Self-pay | Admitting: Internal Medicine

## 2017-11-02 ENCOUNTER — Ambulatory Visit: Payer: No Typology Code available for payment source | Admitting: Internal Medicine

## 2017-11-02 ENCOUNTER — Ambulatory Visit (INDEPENDENT_AMBULATORY_CARE_PROVIDER_SITE_OTHER)
Admission: RE | Admit: 2017-11-02 | Discharge: 2017-11-02 | Disposition: A | Discharge status: Home / Self Care | Payer: No Typology Code available for payment source | Source: Ambulatory Visit | Attending: Internal Medicine | Admitting: Internal Medicine

## 2017-11-02 ENCOUNTER — Other Ambulatory Visit (INDEPENDENT_AMBULATORY_CARE_PROVIDER_SITE_OTHER): Payer: No Typology Code available for payment source

## 2017-11-02 VITALS — BP 138/72 | HR 76 | Ht 74.0 in | Wt 279.8 lb

## 2017-11-02 DIAGNOSIS — R1012 Left upper quadrant pain: Secondary | ICD-10-CM

## 2017-11-02 DIAGNOSIS — R140 Abdominal distension (gaseous): Secondary | ICD-10-CM | POA: Diagnosis not present

## 2017-11-02 DIAGNOSIS — R198 Other specified symptoms and signs involving the digestive system and abdomen: Secondary | ICD-10-CM

## 2017-11-02 LAB — CBC WITH DIFFERENTIAL/PLATELET
BASOS ABS: 0 10*3/uL (ref 0.0–0.1)
Basophils Relative: 0.6 % (ref 0.0–3.0)
EOS ABS: 0.2 10*3/uL (ref 0.0–0.7)
Eosinophils Relative: 2.7 % (ref 0.0–5.0)
HCT: 40.8 % (ref 39.0–52.0)
Hemoglobin: 13.4 g/dL (ref 13.0–17.0)
LYMPHS ABS: 2.2 10*3/uL (ref 0.7–4.0)
Lymphocytes Relative: 36.5 % (ref 12.0–46.0)
MCHC: 32.9 g/dL (ref 30.0–36.0)
MCV: 86.4 fl (ref 78.0–100.0)
MONO ABS: 0.5 10*3/uL (ref 0.1–1.0)
Monocytes Relative: 8.8 % (ref 3.0–12.0)
NEUTROS ABS: 3.1 10*3/uL (ref 1.4–7.7)
NEUTROS PCT: 51.4 % (ref 43.0–77.0)
PLATELETS: 234 10*3/uL (ref 150.0–400.0)
RBC: 4.73 Mil/uL (ref 4.22–5.81)
RDW: 13.7 % (ref 11.5–15.5)
WBC: 6 10*3/uL (ref 4.0–10.5)

## 2017-11-02 LAB — COMPREHENSIVE METABOLIC PANEL
ALK PHOS: 128 U/L — AB (ref 39–117)
ALT: 25 U/L (ref 0–53)
AST: 19 U/L (ref 0–37)
Albumin: 4 g/dL (ref 3.5–5.2)
BILIRUBIN TOTAL: 0.4 mg/dL (ref 0.2–1.2)
BUN: 12 mg/dL (ref 6–23)
CALCIUM: 8.9 mg/dL (ref 8.4–10.5)
CO2: 27 meq/L (ref 19–32)
CREATININE: 0.82 mg/dL (ref 0.40–1.50)
Chloride: 100 mEq/L (ref 96–112)
GFR: 101.54 mL/min (ref >60.00)
GLUCOSE: 108 mg/dL — AB (ref 70–99)
Potassium: 4 mEq/L (ref 3.5–5.1)
Sodium: 134 mEq/L — ABNORMAL LOW (ref 135–145)
TOTAL PROTEIN: 7.2 g/dL (ref 6.0–8.3)

## 2017-11-02 LAB — AMYLASE: AMYLASE: 28 U/L (ref 27–131)

## 2017-11-02 LAB — LIPASE: LIPASE: 19 U/L (ref 11.0–59.0)

## 2017-11-02 MED ORDER — IOPAMIDOL (ISOVUE-300) INJECTION 61%
100.0000 mL | Freq: Once | INTRAVENOUS | Status: AC | PRN
  Administered 2017-11-02: 100 mL via INTRAVENOUS

## 2017-11-02 NOTE — Progress Notes (Signed)
Jeffrey Price 61 y.o. 1955-12-01 [id number] Referred by: Antonietta Jewel, MD  Assessment & Plan:   Encounter Diagnoses  Name Primary?  . LUQ pain Yes  . Increased abdominal girth    Because of him symptoms and signs are not entirely clear, he has gained weight back after being treated for thyroid toxicosis.  I cannot tell if it is increasing abdominal obesity or some other type of process causing his increased abdominal girth like ascites etc.  It is probably the former but he is significantly bothered by this and the pain so we will proceed with a CT of the abdomen and pelvis.  Lab testing today to include chemistries and a CBC and an amylase and a lipase as well.  Further plans pending these results.  Depending upon what is shown, an EGD could be indicated but it does not sound like a gastric pain or process by the history, it sounds like he is due for at least a screening colonoscopy.  I appreciate the opportunity to care for this patient. CC: Antonietta Jewel, MD     Subjective:   Chief Complaint: Left upper quadrant pain  HPI The patient is a very nice widowed 61 year old white man who complains of left upper quadrant pain for the past 6 months or so.  In late 2017 he was diagnosed with thyrotoxicosis and he has lost weight.  Then he started treatment and his weight came up and he says he has never been this heavy before.  Along the way with the increasing abdominal girth he has developed intermittent left upper quadrant pain, not particularly related to eating, it can come and go with intervals up to a few weeks.  Sometimes it is intense and severe and very knifelike.  He will have a spasm after that.  Where he will have pulsating or throbbing pain.  He denies any nausea or vomiting.  He has increasing abdominal girth.  Weight was 244 pounds in November 2017 and he is 279 pounds today he thinks he is having a bit more lower extremity swelling from time to time.  There is occasional or  rare constipation.  Records review says he had a colonoscopy in 2008, through care everywhere I see that in a no violent family medicine note. he was evaluated with labs in the spring as part of routine physical through his primary care provider and his transaminases and bilirubin and kidney function were normal.  He did have triglycerides of 313.  Otherwise normal cholesterol panel.  Alk phos was just slightly elevated at 134.  He had a slight anemia with hemoglobin 12.8 with a normal MCV otherwise normal hemogram. No Known Allergies Current Meds  Medication Sig  . ALPRAZolam (XANAX) 0.5 MG tablet Take 0.5 mg by mouth daily. Takes it at night for sleep  . aspirin 81 MG chewable tablet Chew 81 mg by mouth daily.  Marland Kitchen atenolol (TENORMIN) 100 MG tablet Take 1 tablet (100 mg total) daily by mouth.  . cyclobenzaprine (FLEXERIL) 10 MG tablet Take 10 mg by mouth daily.  Marland Kitchen doxycycline (ADOXA) 100 MG tablet Take 100 mg by mouth 2 (two) times daily.  . furosemide (LASIX) 40 MG tablet Take 40 mg by mouth daily.  . hydrALAZINE (APRESOLINE) 50 MG tablet Take 50 mg by mouth 3 (three) times daily.  . meloxicam (MOBIC) 7.5 MG tablet Take 7.5 mg by mouth daily.  . methimazole (TAPAZOLE) 5 MG tablet Take 5 mg in a.m.   Past Medical  History:  Diagnosis Date  . Blood clot in vein 1995  . Graves disease   . Hyperlipidemia   . Hypertension   . Thyrotoxicosis without thyroid storm    History reviewed. No pertinent surgical history. Social History   Social History Narrative   Patient is widowed.  He has 1 daughter.  He is a Programmer, systems.  He is vapes.  2-3 drinks of bourbon daily when not driving.  4-5 caffeinated beverages daily.  No drug use.      11/02/2017   family history includes Parkinson's disease in his father; Thyroid disease in his brother.   Review of Systems He has some pins and needles type pain of his feet.  He has had the pedal edema.  Chronic low back pain fatigue some  dyspnea and allergy and sinus problems.  All other review of systems negative.  Objective:   Physical Exam _0  138/72   Pulse 76   Ht 6' 2" (1.88 m)   Wt 279 lb 12.8 oz (126.9 kg)   BMI 35.92 kg/m @  General:  Well-developed, well-nourished and in no acute distress Eyes:  anicteric. ENT:   Mouth and posterior pharynx free of lesions.  Neck:   supple w/o thyromegaly or mass.  Lungs: Clear to auscultation bilaterally. Heart:  S1S2, no rubs, murmurs, gallops. Abdomen:  soft, non-tender, no hepatosplenomegaly, hernia, or mass and BS+.  Rectal: Lymph:  no cervical or supraclavicular adenopathy. Extremities:   no edema, cyanosis or clubbing Skin   no rash. Neuro:  A&O x 3.  Psych:  appropriate mood and  Affect.   Data Reviewed: Labs and primary care note of April, endocrinology notes from 2017 and 2018 and labs in the computer

## 2017-11-02 NOTE — Progress Notes (Signed)
Minor lab abnormalities, my chart message to patient

## 2017-11-02 NOTE — Patient Instructions (Signed)
  Your physician has requested that you go to the basement for the following lab work before leaving today: CBC/diff, CMET, Amylase, Lipase   You have been scheduled for a CT scan of the abdomen and pelvis at [facility] ([address]---this is in the same building as Press [facility]).   You are scheduled on 11/04/17 at 10:00AM. You should arrive 15 minutes prior to your appointment time for registration. Please follow the written instructions below on the day of your exam:  WARNING: IF YOU ARE ALLERGIC TO IODINE/X-RAY DYE, PLEASE NOTIFY RADIOLOGY IMMEDIATELY AT [phone]! YOU WILL BE GIVEN A 13 HOUR PREMEDICATION PREP.  1) Do not eat or drink anything after 6:00AM (4 hours prior to your test) 2) You have been given 2 bottles of oral contrast to drink. The solution may taste               better if refrigerated, but do NOT add ice or any other liquid to this solution. Shake             well before drinking.    Drink 1 bottle of contrast @ 8:00AM (2 hours prior to your exam)  Drink 1 bottle of contrast @ 9:00AM (1 hour prior to your exam)  You may take any medications as prescribed with a small amount of water except for the following: Metformin, Glucophage, Glucovance, Avandamet, Riomet, Fortamet, Actoplus Met, Janumet, Glumetza or Metaglip. The above medications must be held the day of the exam AND 48 hours after the exam.  The purpose of you drinking the oral contrast is to aid in the visualization of your intestinal tract. The contrast solution may cause some diarrhea. Before your exam is started, you will be given a small amount of fluid to drink. Depending on your individual set of symptoms, you may also receive an intravenous injection of x-ray contrast/dye. Plan on being at [facility] for 30 minutes or longer, depending on the type of exam you are having performed.  This test typically takes 30-45 minutes to complete.  If you have any questions regarding  your exam or if you need to reschedule, you may call the CT department at [phone] between the hours of 8:00 am and 5:00 pm, Monday-Friday.  ________________________________________________________________________  I appreciate the opportunity to care for you. Silvano Rusk, MD, [facility]

## 2017-11-03 NOTE — Progress Notes (Signed)
Call patient and explain that the CT scan shows some possible mild thickening of part of the small intestine. It is most likely just that the intestine was contracting. Bottom line is that I do not think there is a clear cause of his symptoms seen here or on the labs.  Given his symptoms and these findings I recommend we check with an EGD and a colonoscopy  dxs 1) LUQ pain for EGD 2) screening colonoscopy

## 2017-11-04 ENCOUNTER — Inpatient Hospital Stay: Admission: RE | Admit: 2017-11-04 | Payer: No Typology Code available for payment source | Source: Ambulatory Visit

## 2017-11-04 ENCOUNTER — Other Ambulatory Visit (INDEPENDENT_AMBULATORY_CARE_PROVIDER_SITE_OTHER): Payer: No Typology Code available for payment source

## 2017-11-04 ENCOUNTER — Other Ambulatory Visit: Payer: Self-pay | Admitting: Internal Medicine

## 2017-11-04 DIAGNOSIS — E0500 Thyrotoxicosis with diffuse goiter without thyrotoxic crisis or storm: Secondary | ICD-10-CM

## 2017-11-04 LAB — T3, FREE: T3 FREE: 3.3 pg/mL (ref 2.3–4.2)

## 2017-11-04 LAB — T4, FREE: FREE T4: 0.88 ng/dL (ref 0.60–1.60)

## 2017-11-04 LAB — TSH: TSH: 1.04 u[IU]/mL (ref 0.35–4.50)

## 2017-11-18 ENCOUNTER — Other Ambulatory Visit: Payer: No Typology Code available for payment source

## 2017-12-02 ENCOUNTER — Ambulatory Visit (AMBULATORY_SURGERY_CENTER): Payer: Self-pay

## 2017-12-02 ENCOUNTER — Encounter: Payer: Self-pay | Admitting: Internal Medicine

## 2017-12-02 VITALS — Ht 74.0 in | Wt 278.6 lb

## 2017-12-02 DIAGNOSIS — Z1211 Encounter for screening for malignant neoplasm of colon: Secondary | ICD-10-CM

## 2017-12-02 DIAGNOSIS — R1012 Left upper quadrant pain: Secondary | ICD-10-CM

## 2017-12-02 NOTE — Progress Notes (Signed)
Per pt, no allergies to soy or egg products.Pt not taking any weight loss meds or using  O2 at home.  Pt refused emmi video. 

## 2017-12-09 ENCOUNTER — Encounter: Payer: Self-pay | Admitting: Internal Medicine

## 2017-12-09 ENCOUNTER — Ambulatory Visit (INDEPENDENT_AMBULATORY_CARE_PROVIDER_SITE_OTHER): Payer: No Typology Code available for payment source | Admitting: Internal Medicine

## 2017-12-09 ENCOUNTER — Other Ambulatory Visit: Payer: Self-pay

## 2017-12-09 ENCOUNTER — Encounter: Payer: No Typology Code available for payment source | Admitting: Internal Medicine

## 2017-12-09 VITALS — BP 110/66 | HR 60 | Temp 98.9°F | Resp 15 | Ht 74.0 in | Wt 279.0 lb

## 2017-12-09 DIAGNOSIS — K2970 Gastritis, unspecified, without bleeding: Secondary | ICD-10-CM

## 2017-12-09 DIAGNOSIS — K635 Polyp of colon: Secondary | ICD-10-CM

## 2017-12-09 DIAGNOSIS — K2990 Gastroduodenitis, unspecified, without bleeding: Secondary | ICD-10-CM

## 2017-12-09 DIAGNOSIS — K2100 Gastro-esophageal reflux disease with esophagitis, without bleeding: Secondary | ICD-10-CM

## 2017-12-09 DIAGNOSIS — Z1212 Encounter for screening for malignant neoplasm of rectum: Secondary | ICD-10-CM

## 2017-12-09 DIAGNOSIS — D128 Benign neoplasm of rectum: Secondary | ICD-10-CM

## 2017-12-09 DIAGNOSIS — D125 Benign neoplasm of sigmoid colon: Secondary | ICD-10-CM

## 2017-12-09 DIAGNOSIS — Z1211 Encounter for screening for malignant neoplasm of colon: Secondary | ICD-10-CM | POA: Diagnosis not present

## 2017-12-09 DIAGNOSIS — D129 Benign neoplasm of anus and anal canal: Secondary | ICD-10-CM

## 2017-12-09 DIAGNOSIS — R1012 Left upper quadrant pain: Secondary | ICD-10-CM | POA: Diagnosis present

## 2017-12-09 MED ORDER — PANTOPRAZOLE SODIUM 40 MG PO TBEC
40.0000 mg | DELAYED_RELEASE_TABLET | Freq: Every day | ORAL | 3 refills | Status: DC
Start: 2017-12-09 — End: 2019-03-08

## 2017-12-09 MED ORDER — SODIUM CHLORIDE 0.9 % IV SOLN: 500.0000 mL | Freq: Once | INTRAVENOUS | Status: DC

## 2017-12-09 NOTE — Op Note (Signed)
[facility] Patient Name: Jeffrey Price Procedure Date: 12/09/2017 2:55 PM MRN: [id number] Endoscopist: Gatha Mayer , MD Age: 62 Referring MD:  Date of Birth: 1956-06-05 Gender: Male Account #: [id number] Procedure:                Colonoscopy Indications:              Screening for colorectal malignant neoplasm, This                            is the patient's first colonoscopy Medicines:                Propofol per Anesthesia, Monitored Anesthesia Care Procedure:                Pre-Anesthesia Assessment:                           - Prior to the procedure, a History and Physical                            was performed, and patient medications and                            allergies were reviewed. The patient's tolerance of                            previous anesthesia was also reviewed. The risks                            and benefits of the procedure and the sedation                            options and risks were discussed with the patient.                            All questions were answered, and informed consent                            was obtained. Prior Anticoagulants: The patient has                            taken no previous anticoagulant or antiplatelet                            agents. ASA Grade Assessment: II - A patient with                            mild systemic disease. After reviewing the risks                            and benefits, the patient was deemed in                            satisfactory condition to undergo the procedure.  After obtaining informed consent, the colonoscope                            was passed under direct vision. Throughout the                            procedure, the patient's blood pressure, pulse, and                            oxygen saturations were monitored continuously. The                            Colonoscope was introduced through the anus and   advanced to the the cecum, identified by                            appendiceal orifice and ileocecal valve. The                            colonoscopy was performed without difficulty. The                            patient tolerated the procedure well. The quality                            of the bowel preparation was good. The ileocecal                            valve, appendiceal orifice, and rectum were                            photographed. The bowel preparation used was                            Miralax. Scope In: 3:03:52 PM Scope Out: 3:19:41 PM Scope Withdrawal Time: 0 hours 14 minutes 4 seconds  Total Procedure Duration: 0 hours 15 minutes 49 seconds  Findings:                 The perianal and digital rectal examinations were                            normal. Pertinent negatives include normal prostate                            (size, shape, and consistency).                           A 10 mm polyp was found in the sigmoid colon. The                            polyp was semi-pedunculated. The polyp was removed                            with a hot snare. Resection and retrieval  were                            complete. Verification of patient identification                            for the specimen was done. Estimated blood loss:                            none.                           A diminutive polyp was found in the rectum. The                            polyp was sessile. The polyp was removed with a                            cold snare. Resection and retrieval were complete.                            Verification of patient identification for the                            specimen was done. Estimated blood loss was minimal.                           The exam was otherwise without abnormality on                            direct and retroflexion views. Complications:            No immediate complications. Estimated Blood Loss:     Estimated blood loss was  minimal. Impression:               - One 10 mm polyp in the sigmoid colon, removed                            with a hot snare. Resected and retrieved.                           - One diminutive polyp in the rectum, removed with                            a cold snare. Resected and retrieved.                           - The examination was otherwise normal on direct                            and retroflexion views. Recommendation:           - Patient has a contact number available for                            emergencies. The  signs and symptoms of potential                            delayed complications were discussed with the                            patient. Return to normal activities tomorrow.                            Written discharge instructions were provided to the                            patient.                           - Resume previous diet.                           - Continue present medications.                           - No aspirin, ibuprofen, naproxen, or other                            non-steroidal anti-inflammatory (MELOXICAM) drugs                            for 2 weeks after polyp removal.                           - Repeat colonoscopy is recommended. The                            colonoscopy date will be determined after pathology                            results from today's exam become available for                            review. Gatha Mayer, MD 12/09/2017 3:37:16 PM This report has been signed electronically.

## 2017-12-09 NOTE — Progress Notes (Signed)
Pt's states no medical or surgical changes since previsit or office visit. 

## 2017-12-09 NOTE — Patient Instructions (Addendum)
I found inflammation in the esophagus (suspect acid reflux) and in the stomach and duodenum. I took a biopsy to check for infection related to this and also think could be related to using meloxicam. Might be cause(s) of your symptoms.  There were 2 colon polyps that I saw and removed. Look benign.  I will let you know pathology results and next steps and when to have another routine colonoscopy.  I have sent a prescription to your pharmacy - medication is pantoprazole. Take that for your stomach inflammation and pain.  I appreciate the opportunity to care for you. Gatha Mayer, MD, FACG   YOU HAD AN ENDOSCOPIC PROCEDURE TODAY AT [facility]:   Refer to the procedure report that was given to you for any specific questions about what was found during the examination.  If the procedure report does not answer your questions, please call your gastroenterologist to clarify.  If you requested that your care partner not be given the details of your procedure findings, then the procedure report has been included in a sealed envelope for you to review at your convenience later.  YOU SHOULD EXPECT: Some feelings of bloating in the abdomen. Passage of more gas than usual.  Walking can help get rid of the air that was put into your GI tract during the procedure and reduce the bloating. If you had a lower endoscopy (such as a colonoscopy or flexible sigmoidoscopy) you may notice spotting of blood in your stool or on the toilet paper. If you underwent a bowel prep for your procedure, you may not have a normal bowel movement for a few days.  Please Note:  You might notice some irritation and congestion in your nose or some drainage.  This is from the oxygen used during your procedure.  There is no need for concern and it should clear up in a day or so.  SYMPTOMS TO REPORT IMMEDIATELY:   Following lower endoscopy (colonoscopy or flexible sigmoidoscopy):  Excessive amounts of  blood in the stool  Significant tenderness or worsening of abdominal pains  Swelling of the abdomen that is new, acute  Fever of 100F or higher   Following upper endoscopy (EGD)  Vomiting of blood or coffee ground material  New chest pain or pain under the shoulder blades  Painful or persistently difficult swallowing  New shortness of breath  Fever of 100F or higher  Black, tarry-looking stools  For urgent or emergent issues, a gastroenterologist can be reached at any hour by calling [phone].   DIET:  We do recommend a small meal at first, but then you may proceed to your regular diet.  Drink plenty of fluids but you should avoid alcoholic beverages for 24 hours.  ACTIVITY:  You should plan to take it easy for the rest of today and you should NOT DRIVE or use heavy machinery until tomorrow (because of the sedation medicines used during the test).    FOLLOW UP: Our staff will call the number listed on your records the next business day following your procedure to check on you and address any questions or concerns that you may have regarding the information given to you following your procedure. If we do not reach you, we will leave a message.  However, if you are feeling well and you are not experiencing any problems, there is no need to return our call.  We will assume that you have returned to your regular daily activities without  incident.  If any biopsies were taken you will be contacted by phone or by letter within the next 1-3 weeks.  Please call us at [phone] if you have not heard about the biopsies in 3 weeks.    SIGNATURES/CONFIDENTIALITY: You and/or your care partner have signed paperwork which will be entered into your electronic medical record.  These signatures attest to the fact that that the information above on your After Visit Summary has been reviewed and is understood.  Full responsibility of the confidentiality of this discharge information lies with you  and/or your care-partner.

## 2017-12-09 NOTE — Progress Notes (Signed)
To recovery, report to RN, VSS. 

## 2017-12-09 NOTE — Op Note (Signed)
[place] Patient Name: Jeffrey Price Procedure Date: 12/09/2017 2:55 PM MRN: [id number] Endoscopist: Gatha Mayer , MD Age: 62 Referring MD:  Date of Birth: [birth year] Gender: Male Account #: [id number] Procedure:                Upper GI endoscopy Indications:              Abdominal pain in the left upper quadrant Medicines:                Propofol per Anesthesia, Monitored Anesthesia Care Procedure:                Pre-Anesthesia Assessment:                           - Prior to the procedure, a History and Physical                            was performed, and patient medications and                            allergies were reviewed. The patient's tolerance of                            previous anesthesia was also reviewed. The risks                            and benefits of the procedure and the sedation                            options and risks were discussed with the patient.                            All questions were answered, and informed consent                            was obtained. Prior Anticoagulants: The patient has                            taken no previous anticoagulant or antiplatelet                            agents. ASA Grade Assessment: II - A patient with                            mild systemic disease. After reviewing the risks                            and benefits, the patient was deemed in                            satisfactory condition to undergo the procedure.                           After obtaining informed consent, the endoscope was  passed under direct vision. Throughout the                            procedure, the patient's blood pressure, pulse, and                            oxygen saturations were monitored continuously. The                            Model GIF-HQ190 [id number]) scope was introduced                            through the mouth, and advanced to the second part           of duodenum. The upper GI endoscopy was                            accomplished without difficulty. The patient                            tolerated the procedure well. Scope In: Scope Out: Findings:                 LA Grade A (one or more mucosal breaks less than 5                            mm, not extending between tops of 2 mucosal folds)                            esophagitis with no bleeding was found at the                            gastroesophageal junction.                           Patchy moderate inflammation characterized by                            erosions, erythema, friability and granularity was                            found in the prepyloric region of the stomach.                            Biopsies were taken with a cold forceps for                            Helicobacter pylori testing using CLOtest.                            Verification of patient identification for the                            specimen was done. Estimated blood loss was minimal.  A few diffuse erosions without bleeding were found                            in the duodenal bulb.                           The exam was otherwise without abnormality.                           The cardia and gastric fundus were normal on                            retroflexion. Complications:            No immediate complications. Estimated Blood Loss:     Estimated blood loss was minimal. Impression:               - LA Grade A reflux esophagitis.                           - Gastritis. Biopsied.                           - Duodenal erosions without bleeding.                           - The examination was otherwise normal. Recommendation:           - Patient has a contact number available for                            emergencies. The signs and symptoms of potential                            delayed complications were discussed with the                            patient.  Return to normal activities tomorrow.                            Written discharge instructions were provided to the                            patient.                           - Resume previous diet.                           - Continue present medications.                           - Use Protonix (pantoprazole) 40 mg PO daily.                           - Await pathology results. Gatha Mayer, MD 12/09/2017 3:33:48 PM This report has been signed electronically.

## 2017-12-09 NOTE — Progress Notes (Signed)
Called to room to assist during endoscopic procedure.  Patient ID and intended procedure confirmed with present staff. Received instructions for my participation in the procedure from the performing physician.  

## 2017-12-12 ENCOUNTER — Other Ambulatory Visit (INDEPENDENT_AMBULATORY_CARE_PROVIDER_SITE_OTHER): Payer: No Typology Code available for payment source

## 2017-12-12 DIAGNOSIS — R1012 Left upper quadrant pain: Secondary | ICD-10-CM

## 2017-12-12 LAB — HELICOBACTER PYLORI SCREEN-BIOPSY: UREASE: NEGATIVE

## 2017-12-14 ENCOUNTER — Encounter: Payer: Self-pay | Admitting: Internal Medicine

## 2017-12-14 LAB — HELICOBACTER PYLORI SCREEN-BIOPSY: UREASE: NEGATIVE

## 2017-12-14 NOTE — Progress Notes (Signed)
My Chart letter to patient No recall Neg H pylori

## 2017-12-15 ENCOUNTER — Encounter: Payer: Self-pay | Admitting: Internal Medicine

## 2017-12-15 DIAGNOSIS — Z86010 Personal history of colonic polyps: Secondary | ICD-10-CM

## 2017-12-15 DIAGNOSIS — Z860101 Personal history of adenomatous and serrated colon polyps: Secondary | ICD-10-CM

## 2017-12-15 HISTORY — DX: Personal history of colonic polyps: Z86.010

## 2017-12-15 HISTORY — DX: Personal history of adenomatous and serrated colon polyps: Z86.0101

## 2017-12-15 NOTE — Progress Notes (Signed)
2 adenomas max 10 mm recall 2022 My Chart letter

## 2018-03-10 ENCOUNTER — Ambulatory Visit: Payer: No Typology Code available for payment source | Admitting: Physician Assistant

## 2018-03-10 ENCOUNTER — Ambulatory Visit: Payer: Self-pay | Admitting: Emergency Medicine

## 2018-03-10 ENCOUNTER — Encounter: Payer: Self-pay | Admitting: Physician Assistant

## 2018-03-10 VITALS — BP 130/70 | HR 70 | Temp 97.5°F | Ht 74.0 in | Wt 279.6 lb

## 2018-03-10 DIAGNOSIS — E0500 Thyrotoxicosis with diffuse goiter without thyrotoxic crisis or storm: Secondary | ICD-10-CM | POA: Diagnosis not present

## 2018-03-10 DIAGNOSIS — G6289 Other specified polyneuropathies: Secondary | ICD-10-CM | POA: Diagnosis not present

## 2018-03-10 DIAGNOSIS — I10 Essential (primary) hypertension: Secondary | ICD-10-CM | POA: Diagnosis not present

## 2018-03-10 DIAGNOSIS — K2100 Gastro-esophageal reflux disease with esophagitis, without bleeding: Secondary | ICD-10-CM

## 2018-03-10 LAB — POCT GLYCOSYLATED HEMOGLOBIN (HGB A1C): HEMOGLOBIN A1C: 6.4

## 2018-03-10 MED ORDER — CHLORTHALIDONE 25 MG PO TABS: 12.5000 mg | ORAL_TABLET | Freq: Every day | ORAL | 0 refills | Status: DC

## 2018-03-10 MED ORDER — PANTOPRAZOLE SODIUM 40 MG PO TBEC: 40.0000 mg | DELAYED_RELEASE_TABLET | Freq: Every day | ORAL | 3 refills | Status: DC

## 2018-03-10 MED ORDER — GABAPENTIN 100 MG PO CAPS: ORAL_CAPSULE | ORAL | 0 refills | Status: DC

## 2018-03-10 MED ORDER — CYCLOBENZAPRINE HCL 10 MG PO TABS: 10.0000 mg | ORAL_TABLET | Freq: Every day | ORAL | 3 refills | Status: DC

## 2018-03-10 MED FILL — PANTOPRAZOLE SOD DR 40 MG T: 40 | 90 days supply | Qty: 90 | Fill #0

## 2018-03-10 MED FILL — GABAPENTIN 100 MG CAPSULE: 100 | 17 days supply | Qty: 90 | Fill #0

## 2018-03-10 MED FILL — CHLORTHALIDONE 25 MG TAB: 25 | 90 days supply | Qty: 45 | Fill #0

## 2018-03-10 MED FILL — CYCLOBENZAPRINE 10 MG TAB: 10 | 90 days supply | Qty: 90 | Fill #0

## 2018-03-10 NOTE — Progress Notes (Signed)
03/10/2018 5:20 PM   DOB: 09/22/1956 / MRN: [id number]  SUBJECTIVE:  Jeffrey Price is a 62 y.o. male presenting to establish care.  He has multiple complaints today.  Tells me that his feet and lower legs have been getting worse with respect to neuropathy.  His provider has been giving him Xanax, TCA, Vicodin.  Patient no longer wants to be on Vicodin or Xanax for his complaint.  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  He is taking Lasix 40 mg daily.  He sees Dr. Cruzita Lederer in endocrinology for history of Graves' disease.  For that he is taking atenolol and Tapazole.  He is taking Protonix which was for chronic GERD.  He does drink heavily though not every night.  He likes to vape and does not smoke cigarettes.  Is taking doxycycline for cystic acne on his face.  Is taking hydralazine by daily for blood pressure control.   Current Outpatient Medications:  .  aspirin 81 MG chewable tablet, Chew 81 mg by mouth daily., Disp: , Rfl:  .  atenolol (TENORMIN) 100 MG tablet, Take 1 tablet (100 mg total) daily by mouth., Disp: 90 tablet, Rfl: 3 .  cyclobenzaprine (FLEXERIL) 10 MG tablet, Take 1 tablet (10 mg total) by mouth at bedtime. For nerve pain., Disp: 90 tablet, Rfl: 3 .  doxycycline (ADOXA) 100 MG tablet, Take 100 mg by mouth daily. , Disp: , Rfl:  .  hydrALAZINE (APRESOLINE) 50 MG tablet, Take 50 mg by mouth 2 (two) times daily. , Disp: , Rfl:  .  methimazole (TAPAZOLE) 5 MG tablet, Take 5 mg in a.m., Disp: 30 tablet, Rfl: 5 .  pantoprazole (PROTONIX) 40 MG tablet, Take 1 tablet (40 mg total) by mouth daily before breakfast., Disp: 90 tablet, Rfl: 3 .  chlorthalidone (HYGROTON) 25 MG tablet, Take 0.5 tablets (12.5 mg total) by mouth daily. For swelling, Disp: 90 tablet, Rfl: 0 .  gabapentin (NEURONTIN) 100 MG capsule, Take one tab three times a day for one week then increase to two tabs three time a day for a week, then increase to 3 tabs  three times a day., Disp: 90 capsule, Rfl: 0 .  pantoprazole (PROTONIX) 40 MG tablet, Take 1 tablet (40 mg total) by mouth daily., Disp: 90 tablet, Rfl: 3   He has No Known Allergies.   He  has a past medical history of Arthritis, Blood clot in vein (1995), Bulging lumbar disc, Cataract, Cellulitis (2012), DDD (degenerative disc disease), lumbar, Graves disease, adenomatous colonic polyps (12/15/2017), Hyperlipidemia, Hypertension, and Thyrotoxicosis without thyroid storm.    He  reports that he quit smoking about 6 years ago. He has never used smokeless tobacco. He reports that he drinks about 4.8 - 6.0 oz of alcohol per week. He reports that he does not use drugs. He  has no sexual activity history on file. The patient  has a past surgical history that includes Lumbar epidural injection.  His family history includes Parkinson's disease in his father; Thyroid disease in his mother.  Review of Systems  Constitutional: Negative for chills, diaphoresis and fever.  Respiratory: Negative for cough, hemoptysis, sputum production, shortness of breath and wheezing.   Cardiovascular: Negative for chest pain, orthopnea and leg swelling.  Gastrointestinal: Negative for nausea.  Musculoskeletal: Positive for back pain and myalgias.  Skin: Negative for rash.  Neurological: Positive for tingling. Negative for dizziness, tremors, sensory change, speech  change, focal weakness, seizures, loss of consciousness and weakness.    The problem list and medications were reviewed and updated by myself where necessary and exist elsewhere in the encounter.   OBJECTIVE:  BP 130/70 (BP Location: Left Arm, Patient Position: Sitting, Cuff Size: Large)   Pulse 70   Temp (!) 97.5 F (36.4 C) (Oral)   Ht 6\' 2"  (1.88 m)   Wt 279 lb 9.6 oz (126.8 kg)   SpO2 96%   BMI 35.90 kg/m   Physical Exam  Constitutional: He appears well-nourished. No distress.  Cardiovascular: Normal rate, regular rhythm, normal heart sounds  and intact distal pulses.  No murmur heard. Pulmonary/Chest: Effort normal and breath sounds normal.  Musculoskeletal: He exhibits edema. He exhibits no tenderness or deformity.  Skin: He is not diaphoretic.    Results for orders placed or performed in visit on 03/10/18 (from the past 72 hour(s))  POCT glycosylated hemoglobin (Hb A1C)     Status: None   Collection Time: 03/10/18  4:40 PM  Result Value Ref Range   Hemoglobin A1C 6.4     No results found.  ASSESSMENT AND PLAN:  Diagnoses and all orders for this visit:  Other polyneuropathy Comments: I am stopping his Xanax and Vicodin. Starting gabapentin and tapering this up over 3 weeks I will continue his Flexeril at night. Ultimately he needs neurosurg Orders: -     Vitamin B12 -     gabapentin (NEURONTIN) 100 MG capsule; Take one tab three times a day for one week then increase to two tabs three time a day for a week, then increase to 3 tabs three times a day. -     Renal Function Panel -     cyclobenzaprine (FLEXERIL) 10 MG tablet; Take 1 tablet (10 mg total) by mouth at bedtime. For nerve pain. -     POCT glycosylated hemoglobin (Hb A1C) -     Lipid Panel  Graves disease Comments: Managed by endocrinology. Orders: -     Thyroid Panel With TSH  GERD with esophagitis -     pantoprazole (PROTONIX) 40 MG tablet; Take 1 tablet (40 mg total) by mouth daily.  Essential hypertension: We will likely stop his hydralazine either in the next appointment in favor of an ACE or an ARB.  Most recent chest x-ray was negative for cardiomegaly.  He needs an EKG.  We will focus on this problem more the next time he comes in.  I am updating his lipid panel and his A1c today. Comments: On hydralazine and Lasix.  I am moving him to chlorthalidone 12.5 and will likely increase this to 25 pending his weight and blood pressure in 3 weeks. Orders: -     chlorthalidone (HYGROTON) 25 MG tablet; Take 0.5 tablets (12.5 mg total) by mouth daily. For  swelling    The patient is advised to call or return to clinic if he does not see an improvement in symptoms, or to seek the care of the closest emergency department if he worsens with the above plan.   Philis Fendt, MHS, PA-C [facility] at [facility] 03/10/2018 5:20 PM

## 2018-03-10 NOTE — Patient Instructions (Addendum)
  Make an appointment to increase the gabapentin in about three weeks. WE can focus in on your leg swelling at that time as well.    IF you received an x-ray today, you will receive an invoice from [facility]. Please contact [facility] at [phone] with questions or concerns regarding your invoice.   IF you received labwork today, you will receive an invoice from [facility]. Please contact [facility] at [phone] with questions or concerns regarding your invoice.   Our billing staff will not be able to assist you with questions regarding bills from these companies.  You will be contacted with the lab results as soon as they are available. The fastest way to get your results is to activate your My Chart account. Instructions are located on the last page of this paperwork. If you have not heard from [place] regarding the results in 2 weeks, please contact this office.

## 2018-03-10 NOTE — Progress Notes (Signed)
Lab Results  Component Value Date   WBC 6.0 11/02/2017   HGB 13.4 11/02/2017   HCT 40.8 11/02/2017   MCV 86.4 11/02/2017   PLT 234.0 11/02/2017    Lab Results  Component Value Date   CREATININE 0.82 11/02/2017   BUN 12 11/02/2017   NA 134 (L) 11/02/2017   K 4.0 11/02/2017   CL 100 11/02/2017   CO2 27 11/02/2017    Lab Results  Component Value Date   ALT 25 11/02/2017   AST 19 11/02/2017   ALKPHOS 128 (H) 11/02/2017   BILITOT 0.4 11/02/2017    Lab Results  Component Value Date   TSH 1.04 11/04/2017    No results found for: HGBA1C  No results found for: CHOL, HDL, LDLCALC, LDLDIRECT, TRIG, CHOLHDL

## 2018-03-11 LAB — VITAMIN B12: VITAMIN B 12: 393 pg/mL (ref 232–1245)

## 2018-03-11 LAB — RENAL FUNCTION PANEL
Albumin: 4.3 g/dL (ref 3.6–4.8)
BUN / CREAT RATIO: 11 (ref 10–24)
BUN: 10 mg/dL (ref 8–27)
CALCIUM: 9.1 mg/dL (ref 8.6–10.2)
CHLORIDE: 102 mmol/L (ref 96–106)
CO2: 20 mmol/L (ref 20–29)
Creatinine, Ser: 0.92 mg/dL (ref 0.76–1.27)
GFR calc non Af Amer: 89 mL/min/{1.73_m2} (ref >59)
GFR, EST AFRICAN AMERICAN: 103 mL/min/{1.73_m2} (ref >59)
GLUCOSE: 85 mg/dL (ref 65–99)
Phosphorus: 4.9 mg/dL — ABNORMAL HIGH (ref 2.5–4.5)
Potassium: 4.2 mmol/L (ref 3.5–5.2)
Sodium: 140 mmol/L (ref 134–144)

## 2018-03-11 LAB — THYROID PANEL WITH TSH
FREE THYROXINE INDEX: 1.7 (ref 1.2–4.9)
T3 Uptake Ratio: 24 % (ref 24–39)
T4 TOTAL: 6.9 ug/dL (ref 4.5–12.0)
TSH: 1.33 u[IU]/mL (ref 0.450–4.500)

## 2018-03-11 LAB — LIPID PANEL
Chol/HDL Ratio: 5.3 ratio — ABNORMAL HIGH (ref 0.0–5.0)
Cholesterol, Total: 171 mg/dL (ref 100–199)
HDL: 32 mg/dL — AB (ref >39)
LDL Calculated: 81 mg/dL (ref 0–99)
Triglycerides: 289 mg/dL — ABNORMAL HIGH (ref 0–149)
VLDL CHOLESTEROL CAL: 58 mg/dL — AB (ref 5–40)

## 2018-03-13 ENCOUNTER — Other Ambulatory Visit: Payer: Self-pay | Admitting: Physician Assistant

## 2018-03-13 MED ORDER — ATORVASTATIN CALCIUM 20 MG PO TABS: 20.0000 mg | ORAL_TABLET | Freq: Every day | ORAL | 3 refills | Status: DC

## 2018-03-13 MED FILL — TRIAMCINOLONE 0.1% CREAM: 0.1 | 30 days supply | Qty: 120 | Fill #0

## 2018-03-13 MED FILL — ATORVASTATIN 20 MG TABLET: 20 | 90 days supply | Qty: 90 | Fill #0

## 2018-03-16 ENCOUNTER — Encounter: Payer: Self-pay | Admitting: Physician Assistant

## 2018-03-20 ENCOUNTER — Ambulatory Visit: Payer: No Typology Code available for payment source | Admitting: Internal Medicine

## 2018-03-22 ENCOUNTER — Encounter: Payer: Self-pay | Admitting: Physician Assistant

## 2018-03-23 ENCOUNTER — Other Ambulatory Visit: Payer: Self-pay | Admitting: Physician Assistant

## 2018-03-23 ENCOUNTER — Other Ambulatory Visit: Payer: Self-pay

## 2018-03-23 DIAGNOSIS — G6289 Other specified polyneuropathies: Secondary | ICD-10-CM

## 2018-03-23 MED ORDER — GABAPENTIN 100 MG PO CAPS: ORAL_CAPSULE | ORAL | 0 refills | Status: DC

## 2018-03-23 MED FILL — GABAPENTIN 100 MG CAPSULE: 100 | 9 days supply | Qty: 81 | Fill #0

## 2018-03-23 NOTE — Progress Notes (Signed)
Thank you Rose

## 2018-03-23 NOTE — Progress Notes (Unsigned)
Pt has appt 5/24 to reevaluate gabapentin Sent in 81 tabs to cover pt till OV.  [facility] RN previously denied request.

## 2018-03-31 ENCOUNTER — Ambulatory Visit (INDEPENDENT_AMBULATORY_CARE_PROVIDER_SITE_OTHER): Payer: No Typology Code available for payment source | Admitting: Physician Assistant

## 2018-03-31 ENCOUNTER — Encounter: Payer: Self-pay | Admitting: Physician Assistant

## 2018-03-31 VITALS — BP 100/60 | HR 70 | Temp 98.2°F | Ht 74.0 in | Wt 276.2 lb

## 2018-03-31 DIAGNOSIS — G6289 Other specified polyneuropathies: Secondary | ICD-10-CM | POA: Diagnosis not present

## 2018-03-31 DIAGNOSIS — Z114 Encounter for screening for human immunodeficiency virus [HIV]: Secondary | ICD-10-CM | POA: Diagnosis not present

## 2018-03-31 DIAGNOSIS — R7989 Other specified abnormal findings of blood chemistry: Secondary | ICD-10-CM

## 2018-03-31 DIAGNOSIS — G629 Polyneuropathy, unspecified: Secondary | ICD-10-CM | POA: Insufficient documentation

## 2018-03-31 DIAGNOSIS — Z1159 Encounter for screening for other viral diseases: Secondary | ICD-10-CM | POA: Diagnosis not present

## 2018-03-31 DIAGNOSIS — Z23 Encounter for immunization: Secondary | ICD-10-CM

## 2018-03-31 DIAGNOSIS — I10 Essential (primary) hypertension: Secondary | ICD-10-CM | POA: Diagnosis not present

## 2018-03-31 MED ORDER — CHLORTHALIDONE 25 MG PO TABS: 25.0000 mg | ORAL_TABLET | Freq: Every day | ORAL | 0 refills | Status: DC

## 2018-03-31 MED ORDER — HYDRALAZINE HCL 50 MG PO TABS: 25.0000 mg | ORAL_TABLET | Freq: Two times a day (BID) | ORAL | 0 refills | Status: DC

## 2018-03-31 MED ORDER — GABAPENTIN 400 MG PO CAPS: 400.0000 mg | ORAL_CAPSULE | Freq: Three times a day (TID) | ORAL | 2 refills | Status: DC

## 2018-03-31 MED FILL — GABAPENTIN 400 MG CAPS: 400 | 90 days supply | Qty: 270 | Fill #0

## 2018-03-31 NOTE — Patient Instructions (Addendum)
Take an extra strength tylenol (650 mg) with each dose of gabapentin.   Increase the chlorthalidone to 25 mg and take 1/2 hydralazine daily.  We will follow up on this in 30 days.   I'm checking your testosterone.  We could try a androgel safely if you insurance will pay.   IF you received an x-ray today, you will receive an invoice from [facility]. Please contact [facility] at [phone] with questions or concerns regarding your invoice.   IF you received labwork today, you will receive an invoice from [facility]. Please contact [facility] at [phone] with questions or concerns regarding your invoice.   Our billing staff will not be able to assist you with questions regarding bills from these companies.  You will be contacted with the lab results as soon as they are available. The fastest way to get your results is to activate your My Chart account. Instructions are located on the last page of this paperwork. If you have not heard from [place] regarding the results in 2 weeks, please contact this office.

## 2018-03-31 NOTE — Progress Notes (Signed)
03/31/2018 1:36 PM   DOB: [birth year] / MRN: [id number]  SUBJECTIVE:  Jeffrey Price is a 62 y.o. male presenting for recheck of chronic neuropathic pain, hypertension.  Since starting the gabapentin has not really noticed any significant improvement.  Does think that the Flexeril is helping him sleep.  Since stopping the narcotic and the Xanax he has noticed that he is having some radicular pain about the upper extremities.  Denies chest pain shortness of breath.  He would like to try increasing the gabapentin and denies any side effects with starting this medication.  With regard to blood pressure he is feeling well and denies any presyncope.  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  He tells me that he is not  urinating quite as often and his weights show that he is down a few pounds.  Continues to take atenolol and should remain on this until otherwise advised by endocrinology given history of Graves' disease.  Complains of a history of low testosterone.  Was placed on injections by his former provider however he stopped this after reading about the cardiovascular implications of testosterone injections.  He has No Known Allergies.   He  has a past medical history of Arthritis, Blood clot in vein (1995), Bulging lumbar disc, Cataract, Cellulitis (2012), DDD (degenerative disc disease), lumbar, Graves disease, adenomatous colonic polyps (12/15/2017), Hyperlipidemia, Hypertension, and Thyrotoxicosis without thyroid storm.    He  reports that he quit smoking about 6 years ago. He has never used smokeless tobacco. He reports that he drinks about 4.8 - 6.0 oz of alcohol per week. He reports that he does not use drugs. He  has no sexual activity history on file. The patient  has a past surgical history that includes Lumbar epidural injection.  His family history includes Parkinson's disease in his  father; Thyroid disease in his mother.  Review of Systems  Constitutional: Negative for chills, diaphoresis and fever.  Eyes: Negative.   Respiratory: Negative for cough, hemoptysis, sputum production, shortness of breath and wheezing.   Cardiovascular: Negative for chest pain, orthopnea and leg swelling.  Gastrointestinal: Negative for abdominal pain, blood in stool, constipation, diarrhea, heartburn, melena, nausea and vomiting.  Genitourinary: Negative for dysuria, flank pain, frequency, hematuria and urgency.  Skin: Negative for rash.  Neurological: Negative for dizziness, sensory change, speech change, focal weakness and headaches.    The problem list and medications were reviewed and updated by myself where necessary and exist elsewhere in the encounter.   OBJECTIVE:  BP 100/60 (BP Location: Left Arm, Patient Position: Sitting, Cuff Size: Large)   Pulse 70   Temp 98.2 F (36.8 C) (Oral)   Ht 6\' 2"  (1.88 m)   Wt 276 lb 3.2 oz (125.3 kg)   SpO2 95%   BMI 35.46 kg/m   BP Readings from Last 3 Encounters:  03/31/18 100/60  03/10/18 130/70  12/09/17 110/66   Wt Readings from Last 3 Encounters:  03/31/18 276 lb 3.2 oz (125.3 kg)  03/10/18 279 lb 9.6 oz (126.8 kg)  12/09/17 279 lb (126.6 kg)   Lab Results  Component Value Date   K 4.2 03/10/2018   Lab Results  Component Value Date   WBC 6.0 11/02/2017   HGB 13.4 11/02/2017   HCT 40.8 11/02/2017   MCV 86.4 11/02/2017   PLT 234.0 11/02/2017    Lab Results  Component Value Date   CREATININE 0.92 03/10/2018   BUN 10 03/10/2018   NA 140 03/10/2018   K 4.2 03/10/2018   CL 102 03/10/2018   CO2 20 03/10/2018    Lab Results  Component Value Date   ALT 25 11/02/2017   AST 19 11/02/2017   ALKPHOS 128 (H) 11/02/2017   BILITOT 0.4 11/02/2017    Lab Results  Component Value Date   TSH 1.330 03/10/2018    Lab Results  Component Value Date   HGBA1C 6.4 03/10/2018    Lab Results  Component Value Date    CHOL 171 03/10/2018   HDL 32 (L) 03/10/2018   LDLCALC 81 03/10/2018   TRIG 289 (H) 03/10/2018   CHOLHDL 5.3 (H) 03/10/2018    Physical Exam  Constitutional: He is oriented to person, place, and time. He appears well-developed. He does not appear ill.  Eyes: Pupils are equal, round, and reactive to light. Conjunctivae and EOM are normal.  Cardiovascular: Normal rate, regular rhythm, S1 normal, S2 normal, normal heart sounds, intact distal pulses and normal pulses. Exam reveals no gallop and no friction rub.  No murmur heard. Pulmonary/Chest: Effort normal. No stridor. No respiratory distress. He has no wheezes. He has no rales.  Abdominal: He exhibits no distension.  Musculoskeletal: Normal range of motion. He exhibits no edema, tenderness or deformity.  Neurological: He is alert and oriented to person, place, and time. No cranial nerve deficit. Coordination normal.  Skin: Skin is warm and dry. He is not diaphoretic.  Psychiatric: He has a normal mood and affect.  Nursing note and vitals reviewed.   No results found for this or any previous visit (from the past 72 hour(s)).  No results found.  ASSESSMENT AND PLAN:  Jeffrey Price was seen today for follow-up and medication management.  Diagnoses and all orders for this visit:  Essential hypertension Comments: Doing well on chlorthalidone 12.5. Increasing to 25 mg and dropping hydralazine to 25 mg BID with the hopes of stopping.  Orders: -     chlorthalidone (HYGROTON) 25 MG tablet; Take 1 tablet (25 mg total) by mouth daily. For swelling -     hydrALAZINE (APRESOLINE) 50 MG tablet; Take 0.5 tablets (25 mg total) by mouth 2 (two) times daily. -     Renal Function Panel  Other polyneuropathy Comments: No better or worse since stopping narcotic and xanax. Increasing gabapentin 400 tid.  Orders: -     gabapentin (NEURONTIN) 400 MG capsule; Take 1 capsule (400 mg total) by mouth 3 (three) times daily. Take three tabs three times a  day  Low testosterone in male -     Testosterone  Need for diphtheria-tetanus-pertussis (Tdap) vaccine -     Tdap vaccine greater than or equal to 7yo IM  Need for hepatitis C screening test -     Hepatitis C antibody  Screening for HIV (human immunodeficiency virus) -     HIV antibody    The patient is advised to call or return to clinic if he does not see an improvement in symptoms, or to seek the care of the closest emergency department if he worsens with the above plan.   Philis Fendt, MHS, PA-C [facility] 03/31/2018 1:36 PM

## 2018-04-01 LAB — RENAL FUNCTION PANEL
ALBUMIN: 4.3 g/dL (ref 3.6–4.8)
BUN / CREAT RATIO: 15 (ref 10–24)
BUN: 13 mg/dL (ref 8–27)
CHLORIDE: 100 mmol/L (ref 96–106)
CO2: 20 mmol/L (ref 20–29)
Calcium: 9.5 mg/dL (ref 8.6–10.2)
Creatinine, Ser: 0.85 mg/dL (ref 0.76–1.27)
GFR, EST AFRICAN AMERICAN: 109 mL/min/{1.73_m2} (ref >59)
GFR, EST NON AFRICAN AMERICAN: 94 mL/min/{1.73_m2} (ref >59)
Glucose: 112 mg/dL — ABNORMAL HIGH (ref 65–99)
Phosphorus: 2.9 mg/dL (ref 2.5–4.5)
Potassium: 4.4 mmol/L (ref 3.5–5.2)
SODIUM: 137 mmol/L (ref 134–144)

## 2018-04-01 LAB — HIV ANTIBODY (ROUTINE TESTING W REFLEX): HIV Screen 4th Generation wRfx: NONREACTIVE

## 2018-04-01 LAB — TESTOSTERONE: Testosterone: 231 ng/dL — ABNORMAL LOW (ref 264–916)

## 2018-04-01 LAB — HEPATITIS C ANTIBODY: Hep C Virus Ab: <0.1 s/co ratio (ref 0.0–0.9)

## 2018-04-17 ENCOUNTER — Other Ambulatory Visit: Payer: Self-pay | Admitting: Physician Assistant

## 2018-04-17 DIAGNOSIS — I10 Essential (primary) hypertension: Secondary | ICD-10-CM

## 2018-04-18 MED FILL — ATENOLOL 100 MG TABLET: 100 | 90 days supply | Qty: 90 | Fill #0

## 2018-04-18 MED FILL — CHLORTHALIDONE 25 MG TAB: 25 | 90 days supply | Qty: 90 | Fill #0

## 2018-04-18 MED FILL — DOXYCYCLINE HYCLATE 100 MG: 100 | 60 days supply | Qty: 60 | Fill #0

## 2018-05-05 ENCOUNTER — Encounter: Payer: Self-pay | Admitting: Physician Assistant

## 2018-05-05 ENCOUNTER — Telehealth: Payer: Self-pay | Admitting: Physician Assistant

## 2018-05-05 ENCOUNTER — Ambulatory Visit (INDEPENDENT_AMBULATORY_CARE_PROVIDER_SITE_OTHER): Payer: No Typology Code available for payment source | Admitting: Physician Assistant

## 2018-05-05 VITALS — BP 114/60 | HR 63 | Temp 97.8°F | Ht 74.0 in | Wt 274.0 lb

## 2018-05-05 DIAGNOSIS — I10 Essential (primary) hypertension: Secondary | ICD-10-CM | POA: Diagnosis not present

## 2018-05-05 DIAGNOSIS — G6289 Other specified polyneuropathies: Secondary | ICD-10-CM | POA: Diagnosis not present

## 2018-05-05 MED ORDER — GABAPENTIN 400 MG PO CAPS: 800.0000 mg | ORAL_CAPSULE | Freq: Two times a day (BID) | ORAL | 2 refills | Status: DC

## 2018-05-05 MED ORDER — AMLODIPINE BESYLATE 5 MG PO TABS: 5.0000 mg | ORAL_TABLET | Freq: Every day | ORAL | 3 refills | Status: DC

## 2018-05-05 NOTE — Progress Notes (Signed)
05/05/2018 1:37 PM   DOB: 1956/10/01 / MRN: [id number]  SUBJECTIVE:  Jeffrey Price is a 62 y.o. male presenting for recheck of hypertension. Symptoms present for years.  The problem is improving with chlorthalidone and atenolol.  We have been titrating up his chlorthalidone and titrating down his hydralazine in favor of the longer half-life of chlorthalidone.  Patient is done well on this and is now down to 12.5 hydralazine daily without adverse effects.  He is willing to stop this today.  He denies any improvement with the gabapentin with regard to his chronic peripheral neuropathy.  This is thought to be secondary to his back.  B12 and folate levels  within normal limits recently.  Patient wonders if we can push the dose up today.  He has No Known Allergies.   He  has a past medical history of Arthritis, Blood clot in vein (1995), Bulging lumbar disc, Cataract, Cellulitis (2012), DDD (degenerative disc disease), lumbar, Graves disease, adenomatous colonic polyps (12/15/2017), Hyperlipidemia, Hypertension, and Thyrotoxicosis without thyroid storm.    He  reports that he quit smoking about 6 years ago. He has never used smokeless tobacco. He reports that he drinks about 4.8 - 6.0 oz of alcohol per week. He reports that he does not use drugs. He  has no sexual activity history on file. The patient  has a past surgical history that includes Lumbar epidural injection.  His family history includes Parkinson's disease in his father; Thyroid disease in his mother.  Review of Systems  Constitutional: Negative for diaphoresis.  Eyes: Negative.   Respiratory: Negative for cough, hemoptysis, sputum production, shortness of breath and wheezing.   Cardiovascular: Negative for chest pain, orthopnea and leg swelling.  Gastrointestinal: Negative for abdominal pain, blood in stool, constipation, diarrhea, heartburn, melena, nausea and vomiting.  Genitourinary: Negative for dysuria, flank pain, frequency,  hematuria and urgency.  Neurological: Negative for dizziness, sensory change, speech change, focal weakness and headaches.    The problem list and medications were reviewed and updated by myself where necessary and exist elsewhere in the encounter.   OBJECTIVE:  BP 114/60 (BP Location: Right Arm, Patient Position: Sitting, Cuff Size: Large)   Pulse 63   Temp 97.8 F (36.6 C) (Oral)   Ht 6\' 2"  (1.88 m)   Wt 274 lb (124.3 kg)   SpO2 94%   BMI 35.18 kg/m   Wt Readings from Last 3 Encounters:  05/05/18 274 lb (124.3 kg)  03/31/18 276 lb 3.2 oz (125.3 kg)  03/10/18 279 lb 9.6 oz (126.8 kg)   Temp Readings from Last 3 Encounters:  05/05/18 97.8 F (36.6 C) (Oral)  03/31/18 98.2 F (36.8 C) (Oral)  03/10/18 (!) 97.5 F (36.4 C) (Oral)   BP Readings from Last 3 Encounters:  05/05/18 114/60  03/31/18 100/60  03/10/18 130/70   Pulse Readings from Last 3 Encounters:  05/05/18 63  03/31/18 70  03/10/18 70    Physical Exam  Constitutional: He is oriented to person, place, and time. He appears well-developed. He is active.  Non-toxic appearance. He does not appear ill.  Eyes: Pupils are equal, round, and reactive to light. Conjunctivae and EOM are normal.  Cardiovascular: Normal rate.  Pulmonary/Chest: Effort normal.  Abdominal: He exhibits no distension.  Musculoskeletal: Normal range of motion.  Neurological: He is alert and oriented to person, place, and time. No cranial nerve deficit. Coordination normal.  Skin: Skin is warm and dry. He is not diaphoretic. No pallor.  Psychiatric: He has a normal mood and affect.  Nursing note and vitals reviewed.   Lab Results  Component Value Date   HGBA1C 6.4 03/10/2018    Lab Results  Component Value Date   WBC 6.0 11/02/2017   HGB 13.4 11/02/2017   HCT 40.8 11/02/2017   MCV 86.4 11/02/2017   PLT 234.0 11/02/2017    Lab Results  Component Value Date   CREATININE 0.85 03/31/2018   BUN 13 03/31/2018   NA 137 03/31/2018    K 4.4 03/31/2018   CL 100 03/31/2018   CO2 20 03/31/2018    Lab Results  Component Value Date   ALT 25 11/02/2017   AST 19 11/02/2017   ALKPHOS 128 (H) 11/02/2017   BILITOT 0.4 11/02/2017    Lab Results  Component Value Date   TSH 1.330 03/10/2018    Lab Results  Component Value Date   CHOL 171 03/10/2018   HDL 32 (L) 03/10/2018   LDLCALC 81 03/10/2018   TRIG 289 (H) 03/10/2018   CHOLHDL 5.3 (H) 03/10/2018     ASSESSMENT AND PLAN:  Jeffrey Price was seen today for chonic condition and medication check.  Diagnoses and all orders for this visit:  Well-controlled hypertension: Doing well.  Stopping hydralazine.  He will my chart me if he begins having pressures greater than 140/90 with hydralazine cessation.  Given some Norvasc in the event that his blood pressure does increase however this does not seem likely given the low dose.  Hopefully he will do well on the chlorthalidone alone. -     amLODipine (NORVASC) 5 MG tablet; Take 1 tablet (5 mg total) by mouth daily. Begin this only if blood pressure greater 140/90 on a consistent basis.  Other polyneuropathy Comments: No better or worse since stopping narcotic and xanax. Increasing gabapentin 800 twice daily Orders: -     Discontinue: gabapentin (NEURONTIN) 400 MG capsule; Take 2 capsules (800 mg total) by mouth 2 (two) times daily. Take three tabs three times a day    The patient is advised to call or return to clinic if he does not see an improvement in symptoms, or to seek the care of the closest emergency department if he worsens with the above plan.   Philis Fendt, MHS, PA-C [facility] 05/05/2018 1:37 PM

## 2018-05-05 NOTE — Telephone Encounter (Signed)
Copied from [place] [phone]. Topic: General - Other >> May 05, 2018 12:17 PM Judyann Munson wrote: Reason for CRM: Aaron Edelman from [facility]  calling in regards to the medication gabapentin (NEURONTIN) 400 MG capsule  stated the pharmacy need clarification on medication instructions . Please advise  Med resent with correct instructions.

## 2018-05-05 NOTE — Telephone Encounter (Signed)
Copied from [place] [phone]. Topic: General - Other >> May 05, 2018 12:17 PM Judyann Munson wrote: Reason for CRM: Aaron Edelman from [facility]  calling in regards to the medication gabapentin (NEURONTIN) 400 MG capsule  stated the pharmacy need clarification on medication instructions . Please advise

## 2018-05-05 NOTE — Patient Instructions (Addendum)
  Stop the hydralazine.  Monitor your pressure closely and send me any numbers greater than 140/90.    IF you received an x-ray today, you will receive an invoice from [facility]. Please contact [facility] at [phone] with questions or concerns regarding your invoice.   IF you received labwork today, you will receive an invoice from [facility]. Please contact [facility] at [phone] with questions or concerns regarding your invoice.   Our billing staff will not be able to assist you with questions regarding bills from these companies.  You will be contacted with the lab results as soon as they are available. The fastest way to get your results is to activate your My Chart account. Instructions are located on the last page of this paperwork. If you have not heard from [place] regarding the results in 2 weeks, please contact this office.

## 2018-05-18 ENCOUNTER — Encounter: Payer: Self-pay | Admitting: Physician Assistant

## 2018-05-19 ENCOUNTER — Telehealth: Payer: Self-pay

## 2018-05-19 ENCOUNTER — Other Ambulatory Visit: Payer: Self-pay | Admitting: Physician Assistant

## 2018-05-19 ENCOUNTER — Other Ambulatory Visit: Payer: Self-pay

## 2018-05-19 DIAGNOSIS — I10 Essential (primary) hypertension: Secondary | ICD-10-CM

## 2018-05-19 MED ORDER — PREGABALIN 100 MG PO CAPS: 100.0000 mg | ORAL_CAPSULE | Freq: Two times a day (BID) | ORAL | 3 refills | Status: DC

## 2018-05-19 MED ORDER — AMLODIPINE BESYLATE 5 MG PO TABS: 5.0000 mg | ORAL_TABLET | Freq: Every day | ORAL | 3 refills | Status: DC

## 2018-05-19 MED FILL — AMLODIPINE BESYLATE 5 MG TA: 5 | 30 days supply | Qty: 30 | Fill #0

## 2018-05-19 MED FILL — LYRICA 100 MG CAPSULE: 100 | 30 days supply | Qty: 60 | Fill #0

## 2018-05-19 NOTE — Telephone Encounter (Signed)
[facility] calling and states they did not receive the electronic order for amlodipine.

## 2018-05-19 NOTE — Telephone Encounter (Signed)
Resent - first one printed!!!

## 2018-05-19 NOTE — Telephone Encounter (Signed)
[facility] phone calls.  Pt wanted Amlodipine called in.  Per chart, pt got printed rx and was to fill only if BP consistently over 140...  Pt called [facility] and states he did not get printed rx and his BP is over 140.  Electronically sent in Amlodipine.  New message added - pt wants to try Lyrica - Gabapentin is not working See phone message

## 2018-05-20 NOTE — Telephone Encounter (Signed)
Great. Lyrica sent in as well.

## 2018-06-06 MED FILL — PANTOPRAZOLE SOD DR 40 MG T: 40 | 90 days supply | Qty: 90 | Fill #1

## 2018-06-06 MED FILL — ATORVASTATIN CALCIUM 20 MG: 20 | 90 days supply | Qty: 90 | Fill #1

## 2018-06-09 MED FILL — CYCLOBENZAPRINE HCL 10 MG T: 10 | 90 days supply | Qty: 90 | Fill #1

## 2018-06-16 MED FILL — LYRICA 100 MG CAPSULE: 100 | 30 days supply | Qty: 60 | Fill #1

## 2018-06-21 MED FILL — DOXYCYCLINE HYCLATE 100 MG: 100 | 60 days supply | Qty: 60 | Fill #1

## 2018-06-27 ENCOUNTER — Encounter: Payer: Self-pay | Admitting: Internal Medicine

## 2018-06-27 ENCOUNTER — Encounter: Payer: Self-pay | Admitting: Physician Assistant

## 2018-07-21 MED FILL — ATENOLOL 100 MG TABLET: 100 | 90 days supply | Qty: 90 | Fill #1

## 2018-07-21 MED FILL — AMLODIPINE BESYLATE 5 MG TA: 5 | 30 days supply | Qty: 30 | Fill #1

## 2018-07-21 MED FILL — PREGABALIN 100 MG CAPS: 100 | 30 days supply | Qty: 60 | Fill #2

## 2018-07-21 MED FILL — CHLORTHALIDONE 25 MG TABS: 25 | 90 days supply | Qty: 45 | Fill #1

## 2018-08-21 MED FILL — PREGABALIN 100 MG CAPS: 100 | 30 days supply | Qty: 60 | Fill #3

## 2018-08-21 MED FILL — DOXYCYCLINE HYCLATE 100 MG: 100 | 60 days supply | Qty: 60 | Fill #2

## 2018-08-21 MED FILL — AMLODIPINE BESYLATE 5 MG TA: 5 | 30 days supply | Qty: 30 | Fill #2

## 2018-09-08 ENCOUNTER — Other Ambulatory Visit: Payer: Self-pay | Admitting: Physician Assistant

## 2018-09-08 ENCOUNTER — Telehealth: Payer: Self-pay | Admitting: General Practice

## 2018-09-08 DIAGNOSIS — I10 Essential (primary) hypertension: Secondary | ICD-10-CM

## 2018-09-08 MED FILL — PANTOPRAZOLE SOD DR 40 MG T: 40 | 90 days supply | Qty: 90 | Fill #2

## 2018-09-08 MED FILL — ATORVASTATIN CALCIUM 20 MG: 20 | 90 days supply | Qty: 90 | Fill #2

## 2018-09-08 NOTE — Telephone Encounter (Signed)
Copied from [place] [phone]. Topic: Quick Communication - See Telephone Encounter >> Sep 08, 2018  2:44 PM Antonieta Iba C wrote: CRM for notification. See Telephone encounter for: 09/08/18.  Previous pt of Carlis Abbott says before provider left he suggested Dr. Carlota Raspberry to be pt's new provider. Showing on One note that provider isn't accepting pts. Pt would like to know since he was suggested by previous provider if Dr. Carlota Raspberry would take him on?     If so, pt says that he also need a refill on medication chlorthalidone (HYGROTON) 25 MG tablet. Please advise/assist pt further.   Pharmacy: [place], [address] [place]   [phone]

## 2018-09-11 ENCOUNTER — Telehealth: Payer: Self-pay | Admitting: Family Medicine

## 2018-09-11 MED ORDER — CHLORTHALIDONE 25 MG PO TABS: 25.0000 mg | ORAL_TABLET | Freq: Every day | ORAL | 1 refills | Status: DC

## 2018-09-11 NOTE — Telephone Encounter (Signed)
Copied from [place] [id number]. Topic: Quick Communication - Rx Refill/Question >> Sep 11, 2018  2:15 PM Ler[place], [place] D wrote: Medication: chlorthalidone (HYGROTON) 25 MG tablet / pharmacy stated that 2 different prescriptions were sent in this morning for this medication, each with different instructions. One prescription reads "Take 1 daily (30 count)" The second prescription reads "Take 1/2 daily (90 count)" Please advise pharmacy which prescription is correct.   Has the patient contacted their pharmacy? Yes.   (Agent: If no, request that the patient contact the pharmacy for the refill.) (Agent: If yes, when and what did the pharmacy advise?)  Preferred Pharmacy (with phone number or street name): [place], [place] - [place] [phone] (Phone) [phone] (Fax)   Agent: Please be advised that RX refills may take up to 3 business days. We ask that you follow-up with your pharmacy.

## 2018-09-12 NOTE — Telephone Encounter (Signed)
Spoke with Ute at [facility] Rx for Chlorthalidone 25 mg is the correct dosage per M.Clark[facility], PA note from 03/31/18.

## 2018-09-13 ENCOUNTER — Encounter: Payer: Self-pay | Admitting: Internal Medicine

## 2018-09-13 MED FILL — CHLORTHALIDONE 25 MG TABS: 25 | 30 days supply | Qty: 30 | Fill #0

## 2018-09-15 ENCOUNTER — Other Ambulatory Visit: Payer: Self-pay

## 2018-09-15 ENCOUNTER — Encounter: Payer: Self-pay | Admitting: Internal Medicine

## 2018-09-15 DIAGNOSIS — E0500 Thyrotoxicosis with diffuse goiter without thyrotoxic crisis or storm: Secondary | ICD-10-CM

## 2018-09-15 MED ORDER — METHIMAZOLE 5 MG PO TABS: ORAL_TABLET | ORAL | 5 refills | Status: DC

## 2018-09-18 ENCOUNTER — Other Ambulatory Visit: Payer: Self-pay

## 2018-09-18 DIAGNOSIS — E0500 Thyrotoxicosis with diffuse goiter without thyrotoxic crisis or storm: Secondary | ICD-10-CM

## 2018-09-18 MED ORDER — METHIMAZOLE 5 MG PO TABS: ORAL_TABLET | ORAL | 5 refills | Status: DC

## 2018-09-21 ENCOUNTER — Telehealth: Payer: Self-pay | Admitting: Internal Medicine

## 2018-09-21 DIAGNOSIS — E0500 Thyrotoxicosis with diffuse goiter without thyrotoxic crisis or storm: Secondary | ICD-10-CM

## 2018-09-21 MED ORDER — METHIMAZOLE 5 MG PO TABS: ORAL_TABLET | ORAL | 5 refills | Status: DC

## 2018-09-21 MED FILL — AMLODIPINE BESYLATE 5 MG TA: 5 | 30 days supply | Qty: 30 | Fill #3

## 2018-09-21 MED FILL — methIMAzole 5 MG TABS: 5 | 90 days supply | Qty: 90 | Fill #0

## 2018-09-21 NOTE — Telephone Encounter (Signed)
Please note patient was responded to via MyChart, patient did READ the message this medication was filled. Not sure why pharmacy is telling him it was not done.  Will send over again.

## 2018-09-21 NOTE — Telephone Encounter (Signed)
Patient is out of Tapazole. Please send RX for Tapazole 5 mg sent to [facility] (pharmacy told patient he needed to call our office for new RX). All RX's should be sent [facility] (Patient's preferred pharmacy). Please send patient message on MyChart or call patient when this has been done ph# [phone]. Patient sent MyChart messages with no response.

## 2018-09-25 ENCOUNTER — Other Ambulatory Visit: Payer: Self-pay | Admitting: Physician Assistant

## 2018-09-26 ENCOUNTER — Telehealth: Payer: Self-pay | Admitting: General Practice

## 2018-09-26 NOTE — Telephone Encounter (Signed)
Unfortunately Dr Carlota Raspberry is not taking any new patients right now, Mr Desroches will have to est with someone different.

## 2018-09-26 NOTE — Telephone Encounter (Signed)
Copied from [place] [phone]. Topic: Quick Communication - See Telephone Encounter >> Sep 26, 2018  2:18 PM Antonieta Iba C wrote: CRM for notification. See Telephone encounter for: 09/26/18.  Previous pt of Carlis Abbott is calling in to schedule an ov to establish care /medication follow up. Explained to pt's friend Pleas Koch who called on his behalf that Carlota Raspberry is not taking NP. She says that Carlis Abbott suggested Carlota Raspberry as provider. They would like to know if Dr. Carlota Raspberry would take pt on as NP?   Please advise

## 2018-09-29 ENCOUNTER — Ambulatory Visit (INDEPENDENT_AMBULATORY_CARE_PROVIDER_SITE_OTHER): Payer: No Typology Code available for payment source | Admitting: Family Medicine

## 2018-09-29 ENCOUNTER — Encounter: Payer: Self-pay | Admitting: Family Medicine

## 2018-09-29 ENCOUNTER — Other Ambulatory Visit: Payer: Self-pay

## 2018-09-29 VITALS — BP 116/72 | HR 56 | Temp 98.1°F | Resp 16 | Ht 74.0 in | Wt 280.8 lb

## 2018-09-29 DIAGNOSIS — E0590 Thyrotoxicosis, unspecified without thyrotoxic crisis or storm: Secondary | ICD-10-CM | POA: Diagnosis not present

## 2018-09-29 DIAGNOSIS — Z23 Encounter for immunization: Secondary | ICD-10-CM | POA: Diagnosis not present

## 2018-09-29 DIAGNOSIS — G629 Polyneuropathy, unspecified: Secondary | ICD-10-CM | POA: Diagnosis not present

## 2018-09-29 DIAGNOSIS — I10 Essential (primary) hypertension: Secondary | ICD-10-CM | POA: Diagnosis not present

## 2018-09-29 DIAGNOSIS — L309 Dermatitis, unspecified: Secondary | ICD-10-CM

## 2018-09-29 MED ORDER — TRIAMCINOLONE ACETONIDE 0.1 % EX CREA: 1.0000 "application " | TOPICAL_CREAM | Freq: Two times a day (BID) | CUTANEOUS | 2 refills | Status: DC

## 2018-09-29 MED ORDER — PREGABALIN 200 MG PO CAPS: 100.0000 mg | ORAL_CAPSULE | Freq: Two times a day (BID) | ORAL | 1 refills | Status: DC

## 2018-09-29 MED FILL — TRIAMCINOLONE 0.1% CREAM: 0.1 | 20 days supply | Qty: 60 | Fill #0

## 2018-09-29 MED FILL — PREGABALIN 200 MG CAPS: 200 | 90 days supply | Qty: 180 | Fill #0

## 2018-09-29 NOTE — Telephone Encounter (Signed)
Pt's friend called back to make a same day appt with Dr Nyoka Cowden to establish.  She was told to call this morning. I advised her of the message and she was very upset. She states he has no more medicine and pt needs to be seen today.  Scheduled pt with Dr Linna Darner. Advised her for pt to decide on another dr after today.

## 2018-09-29 NOTE — Progress Notes (Signed)
Patient ID: Jeffrey Price, male    DOB: 1956-01-24  Age: 62 y.o. MRN: [id number]  Chief Complaint  Patient presents with  . Medication Refill    Lyrica 100mg , "pt would like medication dosage increase, it doesn't help that well with foot pain."  Triamcinolone cream 0.1 %  . Thyroid    bloodwork, pt would like a (copy sent to Dr. Philemon Kingdom, Internal Medicine at [facility].)    Subjective:  62 year old man who comes in today with a peripheral neuropathy problem.  He has been on the Lyrica and it helps some but he knows he needs more than that.  He is only taking 100 mg twice a day. He also has a chronic recurrent dermatitis on his ankles that he uses some triamcinolone cream on and would like a refill on that.  He would like to go and get his lab work done for his hyperthyroidism to be able to send it on to his other doctor and avoid having to run in for separate lab testing.  Current allergies, medications, problem list, past/family and social histories reviewed.  Objective:  BP 116/72 (BP Location: Left Arm, Patient Position: Sitting, Cuff Size: Large)   Pulse (!) 56   Temp 98.1 F (36.7 C) (Oral)   Resp 16   Ht 6\' 2"  (1.88 m)   Wt 280 lb 12.8 oz (127.4 kg)   SpO2 95%   BMI 36.05 kg/m   No major acute complaints.  Blood pressure is good today.  He takes his medicines, but he never did have to start on the amlodipine because blood pressures been doing well.  He not really examined again for the neuropathy but he describes the pain.  Dermatitis was noted.  Assessment & Plan:   Assessment: 1. Flu vaccine need   2. Peripheral polyneuropathy   3. Hyperthyroidism   4. Essential hypertension   5. Dermatitis       Plan: See instructions.  Pharmacy will need to call in when he uses up his next round of prescriptions to get that refilled.  Recommend that he comes back in for recheck in about 6 months.  Orders Placed This Encounter  Procedures  . Flu Vaccine  QUAD 6+ mos PF IM (Fluarix Quad PF)  . Thyroid Panel With TSH  . Basic metabolic panel    Meds ordered this encounter  Medications  . pregabalin (LYRICA) 200 MG capsule    Sig: Take 1 capsule (200 mg total) by mouth 2 (two) times daily. Do not take gabapentin while taking this medication.    Dispense:  180 capsule    Refill:  1    Please call patient and advise this is in.  Norvasc also sent for him as well.  . triamcinolone cream (KENALOG) 0.1 %    Sig: Apply 1 application topically 2 (two) times daily.    Dispense:  60 g    Refill:  2         Patient Instructions   Increase the Lyrica to 200 mg twice a day.  Uses triamcinolone cream as needed on the rash.  If it continues to be a problem you might need to see a dermatologist  Next visit ask if someone will remove the little skin tag by your nipple.  Continue your current blood pressure medications  Labs for the thyroid have been ordered.  We will try to send a copy to your doctor, but they can also pull them up on the  computer.  Plan to return in about 6 months.    If you have lab work done today you will be contacted with your lab results within the next 2 weeks.  If you have not heard from [place] then please contact us. The fastest way to get your results is to register for My Chart.   IF you received an x-ray today, you will receive an invoice from [facility]. Please contact [facility] at [phone] with questions or concerns regarding your invoice.   IF you received labwork today, you will receive an invoice from [place] [facility]. Please contact [facility] at [phone] with questions or concerns regarding your invoice.   Our billing staff will not be able to assist you with questions regarding bills from these companies.  You will be contacted with the lab results as soon as they are available. The fastest way to get your results is to activate your My Chart account. Instructions are located on the  last page of this paperwork. If you have not heard from [place] regarding the results in 2 weeks, please contact this office.    Influenza (Flu) Vaccine (Inactivated or Recombinant): What You Need to Know 1. Why get vaccinated? Influenza ("flu") is a contagious disease that spreads around the [place] every year, usually between October and May. Flu is caused by influenza viruses, and is spread mainly by coughing, sneezing, and close contact. Anyone can get flu. Flu strikes suddenly and can last several days. Symptoms vary by age, but can include:  fever/chills  sore throat  muscle aches  fatigue  cough  headache  runny or stuffy nose  Flu can also lead to pneumonia and blood infections, and cause diarrhea and seizures in children. If you have a medical condition, such as heart or lung disease, flu can make it worse. Flu is more dangerous for some people. Infants and young children, people 48 years of age and older, pregnant women, and people with certain health conditions or a weakened immune system are at greatest risk. Each year thousands of people in the [place] die from flu, and many more are hospitalized. Flu vaccine can:  keep you from getting flu,  make flu less severe if you do get it, and  keep you from spreading flu to your family and other people. 2. Inactivated and recombinant flu vaccines A dose of flu vaccine is recommended every flu season. Children 6 months through 33 years of age may need two doses during the same flu season. Everyone else needs only one dose each flu season. Some inactivated flu vaccines contain a very small amount of a mercury-based preservative called thimerosal. Studies have not shown thimerosal in vaccines to be harmful, but flu vaccines that do not contain thimerosal are available. There is no live flu virus in flu shots. They cannot cause the flu. There are many flu viruses, and they are always changing. Each year a new flu vaccine  is made to protect against three or four viruses that are likely to cause disease in the upcoming flu season. But even when the vaccine doesn't exactly match these viruses, it may still provide some protection. Flu vaccine cannot prevent:  flu that is caused by a virus not covered by the vaccine, or  illnesses that look like flu but are not.  It takes about 2 weeks for protection to develop after vaccination, and protection lasts through the flu season. 3. Some people should not get this vaccine Tell the person who is  giving you the vaccine:  If you have any severe, life-threatening allergies. If you ever had a life-threatening allergic reaction after a dose of flu vaccine, or have a severe allergy to any part of this vaccine, you may be advised not to get vaccinated. Most, but not all, types of flu vaccine contain a small amount of egg protein.  If you ever had Guillain-Barr Syndrome (also called GBS). Some people with a history of GBS should not get this vaccine. This should be discussed with your doctor.  If you are not feeling well. It is usually okay to get flu vaccine when you have a mild illness, but you might be asked to come back when you feel better.  4. Risks of a vaccine reaction With any medicine, including vaccines, there is a chance of reactions. These are usually mild and go away on their own, but serious reactions are also possible. Most people who get a flu shot do not have any problems with it. Minor problems following a flu shot include:  soreness, redness, or swelling where the shot was given  hoarseness  sore, red or itchy eyes  cough  fever  aches  headache  itching  fatigue  If these problems occur, they usually begin soon after the shot and last 1 or 2 days. More serious problems following a flu shot can include the following:  There may be a small increased risk of Guillain-Barre Syndrome (GBS) after inactivated flu vaccine. This risk has been  estimated at 1 or 2 additional cases per million people vaccinated. This is much lower than the risk of severe complications from flu, which can be prevented by flu vaccine.  Young children who get the flu shot along with pneumococcal vaccine (PCV13) and/or DTaP vaccine at the same time might be slightly more likely to have a seizure caused by fever. Ask your doctor for more information. Tell your doctor if a child who is getting flu vaccine has ever had a seizure.  Problems that could happen after any injected vaccine:  People sometimes faint after a medical procedure, including vaccination. Sitting or lying down for about 15 minutes can help prevent fainting, and injuries caused by a fall. Tell your doctor if you feel dizzy, or have vision changes or ringing in the ears.  Some people get severe pain in the shoulder and have difficulty moving the arm where a shot was given. This happens very rarely.  Any medication can cause a severe allergic reaction. Such reactions from a vaccine are very rare, estimated at about 1 in a million doses, and would happen within a few minutes to a few hours after the vaccination. As with any medicine, there is a very remote chance of a vaccine causing a serious injury or death. The safety of vaccines is always being monitored. For more information, visit: http://www.aguilar.org/ 5. What if there is a serious reaction? What should I look for? Look for anything that concerns you, such as signs of a severe allergic reaction, very high fever, or unusual behavior. Signs of a severe allergic reaction can include hives, swelling of the face and throat, difficulty breathing, a fast heartbeat, dizziness, and weakness. These would start a few minutes to a few hours after the vaccination. What should I do?  If you think it is a severe allergic reaction or other emergency that can't wait, call 9-1-1 and get the person to the nearest hospital. Otherwise, call your  doctor.  Reactions should be reported to the  Vaccine Adverse Event Reporting System (VAERS). Your doctor should file this report, or you can do it yourself through the VAERS web site at www.vaers.SamedayNews.es, or by calling 408-172-0093. ? VAERS does not give medical advice. 6. The National Vaccine Injury Compensation Program The [employer] Vaccine Injury Compensation Program (VICP) is a federal program that was created to compensate people who may have been injured by certain vaccines. Persons who believe they may have been injured by a vaccine can learn about the program and about filing a claim by calling 289-826-3152 or visiting the [place] website at GoldCloset.com.ee. There is a time limit to file a claim for compensation. 7. How can I learn more?  Ask your healthcare provider. He or she can give you the vaccine package insert or suggest other sources of information.  Call your local or state health department.  Contact the Centers for Disease Control and Prevention (CDC): ? Call 347-501-1537 (1-800-CDC-INFO) or ? Visit CDC's website at https://gibson.com/ Vaccine Information Statement, Inactivated Influenza Vaccine (06/14/2014) This information is not intended to replace advice given to you by your health care provider. Make sure you discuss any questions you have with your health care provider. Document Released: 08/19/2006 Document Revised: 07/15/2016 Document Reviewed: 07/15/2016 Elsevier Interactive Patient Education  2017 [employer].     Return in about 6 months (around 03/30/2019).   Ruben Reason, MD 09/29/2018

## 2018-09-29 NOTE — Patient Instructions (Addendum)
Increase the Lyrica to 200 mg twice a day.  Uses triamcinolone cream as needed on the rash.  If it continues to be a problem you might need to see a dermatologist  Next visit ask if someone will remove the little skin tag by your nipple.  Continue your current blood pressure medications  Labs for the thyroid have been ordered.  We will try to send a copy to your doctor, but they can also pull them up on the computer.  Plan to return in about 6 months.    If you have lab work done today you will be contacted with your lab results within the next 2 weeks.  If you have not heard from [place] then please contact us. The fastest way to get your results is to register for My Chart.   IF you received an x-ray today, you will receive an invoice from [facility]. Please contact [facility] at [phone] with questions or concerns regarding your invoice.   IF you received labwork today, you will receive an invoice from [place]. Please contact [facility] at [phone] with questions or concerns regarding your invoice.   Our billing staff will not be able to assist you with questions regarding bills from these companies.  You will be contacted with the lab results as soon as they are available. The fastest way to get your results is to activate your My Chart account. Instructions are located on the last page of this paperwork. If you have not heard from [place] regarding the results in 2 weeks, please contact this office.    Influenza (Flu) Vaccine (Inactivated or Recombinant): What You Need to Know 1. Why get vaccinated? Influenza ("flu") is a contagious disease that spreads around the [place] every year, usually between October and May. Flu is caused by influenza viruses, and is spread mainly by coughing, sneezing, and close contact. Anyone can get flu. Flu strikes suddenly and can last several days. Symptoms vary by age, but can include:  fever/chills  sore throat  muscle  aches  fatigue  cough  headache  runny or stuffy nose  Flu can also lead to pneumonia and blood infections, and cause diarrhea and seizures in children. If you have a medical condition, such as heart or lung disease, flu can make it worse. Flu is more dangerous for some people. Infants and young children, people 61 years of age and older, pregnant women, and people with certain health conditions or a weakened immune system are at greatest risk. Each year thousands of people in the [place] die from flu, and many more are hospitalized. Flu vaccine can:  keep you from getting flu,  make flu less severe if you do get it, and  keep you from spreading flu to your family and other people. 2. Inactivated and recombinant flu vaccines A dose of flu vaccine is recommended every flu season. Children 6 months through 71 years of age may need two doses during the same flu season. Everyone else needs only one dose each flu season. Some inactivated flu vaccines contain a very small amount of a mercury-based preservative called thimerosal. Studies have not shown thimerosal in vaccines to be harmful, but flu vaccines that do not contain thimerosal are available. There is no live flu virus in flu shots. They cannot cause the flu. There are many flu viruses, and they are always changing. Each year a new flu vaccine is made to protect against three or four viruses that are likely to  cause disease in the upcoming flu season. But even when the vaccine doesn't exactly match these viruses, it may still provide some protection. Flu vaccine cannot prevent:  flu that is caused by a virus not covered by the vaccine, or  illnesses that look like flu but are not.  It takes about 2 weeks for protection to develop after vaccination, and protection lasts through the flu season. 3. Some people should not get this vaccine Tell the person who is giving you the vaccine:  If you have any severe, life-threatening  allergies. If you ever had a life-threatening allergic reaction after a dose of flu vaccine, or have a severe allergy to any part of this vaccine, you may be advised not to get vaccinated. Most, but not all, types of flu vaccine contain a small amount of egg protein.  If you ever had Guillain-Barr Syndrome (also called GBS). Some people with a history of GBS should not get this vaccine. This should be discussed with your doctor.  If you are not feeling well. It is usually okay to get flu vaccine when you have a mild illness, but you might be asked to come back when you feel better.  4. Risks of a vaccine reaction With any medicine, including vaccines, there is a chance of reactions. These are usually mild and go away on their own, but serious reactions are also possible. Most people who get a flu shot do not have any problems with it. Minor problems following a flu shot include:  soreness, redness, or swelling where the shot was given  hoarseness  sore, red or itchy eyes  cough  fever  aches  headache  itching  fatigue  If these problems occur, they usually begin soon after the shot and last 1 or 2 days. More serious problems following a flu shot can include the following:  There may be a small increased risk of Guillain-Barre Syndrome (GBS) after inactivated flu vaccine. This risk has been estimated at 1 or 2 additional cases per million people vaccinated. This is much lower than the risk of severe complications from flu, which can be prevented by flu vaccine.  Young children who get the flu shot along with pneumococcal vaccine (PCV13) and/or DTaP vaccine at the same time might be slightly more likely to have a seizure caused by fever. Ask your doctor for more information. Tell your doctor if a child who is getting flu vaccine has ever had a seizure.  Problems that could happen after any injected vaccine:  People sometimes faint after a medical procedure, including  vaccination. Sitting or lying down for about 15 minutes can help prevent fainting, and injuries caused by a fall. Tell your doctor if you feel dizzy, or have vision changes or ringing in the ears.  Some people get severe pain in the shoulder and have difficulty moving the arm where a shot was given. This happens very rarely.  Any medication can cause a severe allergic reaction. Such reactions from a vaccine are very rare, estimated at about 1 in a million doses, and would happen within a few minutes to a few hours after the vaccination. As with any medicine, there is a very remote chance of a vaccine causing a serious injury or death. The safety of vaccines is always being monitored. For more information, visit: http://www.aguilar.org/ 5. What if there is a serious reaction? What should I look for? Look for anything that concerns you, such as signs of a severe allergic reaction, very  high fever, or unusual behavior. Signs of a severe allergic reaction can include hives, swelling of the face and throat, difficulty breathing, a fast heartbeat, dizziness, and weakness. These would start a few minutes to a few hours after the vaccination. What should I do?  If you think it is a severe allergic reaction or other emergency that can't wait, call 9-1-1 and get the person to the nearest hospital. Otherwise, call your doctor.  Reactions should be reported to the Vaccine Adverse Event Reporting System (VAERS). Your doctor should file this report, or you can do it yourself through the VAERS web site at www.vaers.SamedayNews.es, or by calling 214-712-0877. ? VAERS does not give medical advice. 6. The National Vaccine Injury Compensation Program The [employer] Vaccine Injury Compensation Program (VICP) is a federal program that was created to compensate people who may have been injured by certain vaccines. Persons who believe they may have been injured by a vaccine can learn about the program and about filing a  claim by calling (620)805-2551 or visiting the [place] website at GoldCloset.com.ee. There is a time limit to file a claim for compensation. 7. How can I learn more?  Ask your healthcare provider. He or she can give you the vaccine package insert or suggest other sources of information.  Call your local or state health department.  Contact the Centers for Disease Control and Prevention (CDC): ? Call 312-483-2846 (1-800-CDC-INFO) or ? Visit CDC's website at https://gibson.com/ Vaccine Information Statement, Inactivated Influenza Vaccine (06/14/2014) This information is not intended to replace advice given to you by your health care provider. Make sure you discuss any questions you have with your health care provider. Document Released: 08/19/2006 Document Revised: 07/15/2016 Document Reviewed: 07/15/2016 Elsevier Interactive Patient Education  2017 [employer].

## 2018-09-29 NOTE — Telephone Encounter (Signed)
This is a duplicate message.

## 2018-09-30 LAB — BASIC METABOLIC PANEL
BUN / CREAT RATIO: 13 (ref 10–24)
BUN: 10 mg/dL (ref 8–27)
CALCIUM: 9.3 mg/dL (ref 8.6–10.2)
CHLORIDE: 92 mmol/L — AB (ref 96–106)
CO2: 23 mmol/L (ref 20–29)
Creatinine, Ser: 0.8 mg/dL (ref 0.76–1.27)
GFR, EST AFRICAN AMERICAN: 111 mL/min/{1.73_m2} (ref >59)
GFR, EST NON AFRICAN AMERICAN: 96 mL/min/{1.73_m2} (ref >59)
Glucose: 97 mg/dL (ref 65–99)
Potassium: 3.8 mmol/L (ref 3.5–5.2)
Sodium: 131 mmol/L — ABNORMAL LOW (ref 134–144)

## 2018-09-30 LAB — THYROID PANEL WITH TSH
FREE THYROXINE INDEX: 1.7 (ref 1.2–4.9)
T3 Uptake Ratio: 24 % (ref 24–39)
T4, Total: 7 ug/dL (ref 4.5–12.0)
TSH: 2.58 u[IU]/mL (ref 0.450–4.500)

## 2018-10-02 ENCOUNTER — Encounter: Payer: Self-pay | Admitting: Family Medicine

## 2018-10-10 MED FILL — CHLORTHALIDONE 25 MG TABS: 25 | 30 days supply | Qty: 30 | Fill #1

## 2018-10-18 MED FILL — CYCLOBENZAPRINE HCL 10 MG T: 10 | 90 days supply | Qty: 90 | Fill #2

## 2018-10-18 MED FILL — DOXYCYCLINE HYCLATE 100 MG: 100 | 60 days supply | Qty: 60 | Fill #3

## 2018-11-03 ENCOUNTER — Ambulatory Visit: Payer: No Typology Code available for payment source | Admitting: Physician Assistant

## 2018-11-04 ENCOUNTER — Telehealth: Payer: Self-pay | Admitting: Family Medicine

## 2018-11-04 NOTE — Telephone Encounter (Signed)
Pt. Requests that the medication prescribed by Dr. Linna Darner on 09/29/18 (Lyrica) was to be reperscribed at a higher dosage. Pt. Asserts that this is according to Dr. Darrol Angel instructions. Pt. Also asserts that the chlorthalidone prescribed has caused swelling and has not worked. Pt. Would like to swap back to the medication Surosenide 40mg  (lasix).   This message is being routed directly to Dr. Nolon Rod per her request.

## 2018-11-06 MED FILL — ATENOLOL 100 MG TABLET: 100 | 90 days supply | Qty: 90 | Fill #2

## 2018-11-09 NOTE — Telephone Encounter (Signed)
Jeffrey Price also noted pt is leaving town Monday morning and that [facility] is only open Monday - Friday.

## 2018-11-09 NOTE — Telephone Encounter (Signed)
Curtis Sites, Girlfriend, calling to f/u on two prescriptions. They have checked with [facility] and nothing has been received. Vito Backers is requesting call for f/u.

## 2018-11-10 NOTE — Telephone Encounter (Signed)
Dr. Carlota Raspberry pt is calling and girlfriend is complaining about message has not be handle. Pt was given Lyrica 200 mg twice a day. He stated that Dr. Linna Darner had told him if this is not working that the medication can be increased to 600 mg twice a day. Pt is requesting an increase. He also, stated that Lasix 40 mg works better for him, Chlorthalidone caused swelling. His girlfriend wanted you to know that she had given him the Lasix 40 mg and he lost 11 pounds of fluid in 2 days, the Chlorthalidone caused fluid retention. Please send medication into [facility]. Pt has appointment with you on 11/29/18. He needs more of the Lyrica to take 200 mg three times a day.

## 2018-11-10 NOTE — Telephone Encounter (Signed)
Message noted.  I am listed as his primary care provider, but have not seen him yet (prior patient of Philis Fendt). Has appt with me Jan 22nd.   Dr. Clayborn Heron note reviewed on November 22.  Lyrica was increased from 100 mg twice daily to 200 mg twice daily, however unfortunately I do not see where he had talked about increasing it further from that dose. Max dose is 600mg  per day total, but I am concerned about the reports of swelling as peripheral edema is a possible side effect of the Lyrica. I would not feel comfortable about increasing it until that has been further evaluated. #180 of lyrica prescribed on 11/22. That should last 90 days if using twice per day.  If running out, can refill temporarily at 200mg  BID dosing until scheduled office visit.   In regards to chlorthalidone, that is a diuretic so should not cause swelling.  He does appear to be on amlodipine which can cause swelling as well.  I would not feel comfortable stopping that without assessment of blood pressure further.  Additionally if he has been using Lasix and that much fluid loss he could have some electrolyte abnormalities as sodium was low when checked in November.  I do not see a diagnosis of CHF and last EF of 57% noted from 2003 - does not appear to have recent testing.   Given above issues, it would be best to see him in the office. I will send him a mychart message, but please call tomorrow and verify he received info.  Also schedule for appt as soon as possible to specifically discuss swelling and Lyrica.  If any acute worsening swelling would recommend urgent care or ER eval if unable to be seen here.  Thanks.

## 2018-11-15 ENCOUNTER — Encounter: Payer: Self-pay | Admitting: Family Medicine

## 2018-11-15 NOTE — Telephone Encounter (Signed)
Patient is scheduled   

## 2018-11-17 ENCOUNTER — Encounter: Payer: Self-pay | Admitting: Internal Medicine

## 2018-11-17 ENCOUNTER — Other Ambulatory Visit: Payer: Self-pay | Admitting: Family Medicine

## 2018-11-17 ENCOUNTER — Telehealth: Payer: Self-pay | Admitting: Internal Medicine

## 2018-11-17 DIAGNOSIS — I10 Essential (primary) hypertension: Secondary | ICD-10-CM

## 2018-11-17 MED ORDER — CHLORTHALIDONE 25 MG PO TABS: 25.0000 mg | ORAL_TABLET | Freq: Every day | ORAL | 0 refills | Status: DC

## 2018-11-17 MED FILL — CHLORTHALIDONE 25 MG TABS: 25 | 30 days supply | Qty: 30 | Fill #0

## 2018-11-17 NOTE — Telephone Encounter (Signed)
Requested Prescriptions  Pending Prescriptions Disp Refills  . chlorthalidone (HYGROTON) 25 MG tablet 30 tablet 0    Sig: Take 1 tablet (25 mg total) by mouth daily. For swelling     Cardiovascular: Diuretics - Thiazide Failed - 11/17/2018 10:18 AM      Failed - Na in normal range and within 360 days    Sodium  Date Value Ref Range Status  09/29/2018 131 (L) 134 - 144 mmol/L Final         Failed - Valid encounter within last 6 months    Recent Outpatient Visits          1 month ago Flu vaccine need   [facility] at [facility], Fenton Malling, MD   6 months ago Well-controlled hypertension   [facility] at [place], PA-C   7 months ago Essential hypertension   [facility] at [facility], Audrie Lia, PA-C   8 months ago Other polyneuropathy   [facility] at [facility], Audrie Lia, PA-C      Future Appointments            In 1 week Wendie Agreste, MD [facility] at [place], [facility]   In 3 months Wendie Agreste, MD [facility] at [place], [place] in normal range and within 360 days    Calcium  Date Value Ref Range Status  09/29/2018 9.3 8.6 - 10.2 mg/dL Final         Passed - Cr in normal range and within 360 days    Creatinine, Ser  Date Value Ref Range Status  09/29/2018 0.80 0.76 - 1.27 mg/dL Final         Passed - K in normal range and within 360 days    Potassium  Date Value Ref Range Status  09/29/2018 3.8 3.5 - 5.2 mmol/L Final         Passed - Last BP in normal range    BP Readings from Last 1 Encounters:  09/29/18 116/72

## 2018-11-17 NOTE — Telephone Encounter (Signed)
TFT's normal. Will let him know.

## 2018-11-17 NOTE — Telephone Encounter (Signed)
Copied from [place] [id number]. Topic: Quick Communication - Rx Refill/Question >> Nov 17, 2018 10:08 AM Antonieta Iba C wrote: Medication: chlorthalidone (HYGROTON) 25 MG tablet   Has the patient contacted their pharmacy? Yes  (Agent: If no, request that the patient contact the pharmacy for the refill.) (Agent: If yes, when and what did the pharmacy advise?)  Preferred Pharmacy (with phone number or street name): [place], [place]  Agent: Please be advised that RX refills may take up to 3 business days. We ask that you follow-up with your pharmacy.   Pt says that he has been without medication for 2 days. Pt says that he requested medication days ago through his pharmacy. Pt is scheduled to see Carlota Raspberry on 11/29/18.    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   Pt would like refill as soon as possible.

## 2018-11-17 NOTE — Telephone Encounter (Signed)
Patient wants to let Dr. Cruzita Lederer know that labs including thyroid panel were done at [facility] and are available on MyChart. Please call patient at ph# [phone] to advise after reviewing labs.

## 2018-11-18 ENCOUNTER — Other Ambulatory Visit: Payer: Self-pay | Admitting: Family Medicine

## 2018-11-18 NOTE — Progress Notes (Signed)
See mychart message - refill of chlorthalidone done yesterday.

## 2018-11-29 ENCOUNTER — Ambulatory Visit: Payer: BLUE CROSS/BLUE SHIELD | Admitting: Family Medicine

## 2018-11-29 ENCOUNTER — Encounter: Payer: Self-pay | Admitting: Family Medicine

## 2018-11-29 ENCOUNTER — Other Ambulatory Visit: Payer: Self-pay

## 2018-11-29 VITALS — BP 122/73 | HR 61 | Temp 97.8°F | Resp 16 | Ht 74.0 in | Wt 281.0 lb

## 2018-11-29 DIAGNOSIS — I10 Essential (primary) hypertension: Secondary | ICD-10-CM | POA: Diagnosis not present

## 2018-11-29 DIAGNOSIS — G6289 Other specified polyneuropathies: Secondary | ICD-10-CM | POA: Diagnosis not present

## 2018-11-29 DIAGNOSIS — E785 Hyperlipidemia, unspecified: Secondary | ICD-10-CM

## 2018-11-29 DIAGNOSIS — R609 Edema, unspecified: Secondary | ICD-10-CM

## 2018-11-29 DIAGNOSIS — E871 Hypo-osmolality and hyponatremia: Secondary | ICD-10-CM | POA: Diagnosis not present

## 2018-11-29 DIAGNOSIS — M549 Dorsalgia, unspecified: Secondary | ICD-10-CM

## 2018-11-29 DIAGNOSIS — G8929 Other chronic pain: Secondary | ICD-10-CM

## 2018-11-29 NOTE — Patient Instructions (Addendum)
For chronic back pain and peripheral neuropathy I would like you to meet with a back specialist first to see if there are other options on treating back pain which may be related to the pain in your feet or peripheral neuropathy.  Okay to continue Lyrica same dose for now as I am concerned a higher dose could cause more side effects including worsening swelling.  Okay to take the Flexeril at night as long as it is not causing any daytime sedation.  Do not take Flexeril or other sedating medications when driving.   I would recommend wearing compression stocking when driving. I will check sodium level again today, but may need to decrease salt intake to help with swelling. See info on peripheral edema below. I will also refer you to cardiology to decide on updated echo of heart if needed.   Depending on blood work, we can discuss changing back to lasix from chlorthalidone.   Please follow up in next few weeks to discuss labs and swelling plan further. Thanks for coming in today.   If you have lab work done today you will be contacted with your lab results within the next 2 weeks.  If you have not heard from [place] then please contact us. The fastest way to get your results is to register for My Chart.   IF you received an x-ray today, you will receive an invoice from [facility]. Please contact [facility] at [phone] with questions or concerns regarding your invoice.   IF you received labwork today, you will receive an invoice from [facility]. Please contact [facility] at [phone] with questions or concerns regarding your invoice.   Our billing staff will not be able to assist you with questions regarding bills from these companies.  You will be contacted with the lab results as soon as they are available. The fastest way to get your results is to activate your My Chart account. Instructions are located on the last page of this paperwork. If you have not heard from [place] regarding the  results in 2 weeks, please contact this office.

## 2018-11-29 NOTE — Progress Notes (Signed)
Subjective:    Patient ID: Jeffrey Price, male    DOB: 09-28-1956, 63 y.o.   MRN: [id number]  Chief Complaint  Patient presents with  . Establish Care    need form filled out stating flexeril and lyrica do not effect driving (only take at night) form is in the door     HPI Jeffrey Price is a 63 y.o. male who is [facility] at [facility] to establish care. He has a history of HTN, peripheral neuropathy, colon polyps, and graves disease on tapazol.    HTN Currently on atenolol 100 mg chlorthalidone 25 mg. He is compliant with his medications and has no side effects. Instead of chlorthalidone, he took lasix 40 mg daily for 5 days previously and dropped 10 Ibs.   He denies leg pain or shortness of breath, but doesn't have feet pain with pain when he touches his toes. The swelling in his lower limbs has been there for 5-6 years. He has not noticed worsening of the LL swelling after starting Lyrica. Longstanding edema, no known hx of CHF.  He denies seeing a cardiologist.  BP Readings from Last 3 Encounters:  11/29/18 122/73  09/29/18 116/72  05/05/18 114/60   Lab Results  Component Value Date   CREATININE 0.80 09/29/2018    HLD Currently on Lipidor 20 mg QD, no new myalgias/side effects.   Lab Results  Component Value Date   CHOL 171 03/10/2018   HDL 32 (L) 03/10/2018   LDLCALC 81 03/10/2018   TRIG 289 (H) 03/10/2018   CHOLHDL 5.3 (H) 03/10/2018   Lab Results  Component Value Date   ALT 25 11/02/2017   AST 19 11/02/2017   ALKPHOS 128 (H) 11/02/2017   BILITOT 0.4 11/02/2017   Hyponatremia Sodium 131 on 09/2018. Previously normal earlier in the year.  He wears compression stocking as needed, but not daily. He sometimes wears these stockings while driving.  He noted that the swelling moves up when he wears them, but doesn't completely resolve.  History of Graves disease He follows up with Dr. Cruzita Lederer. Last TSH in 09/2018 was normal at 2.58  Peripheral Neuropathy   Discussed with Dr. Linna Darner in 09/2018. Was on Lyrica 100 mg BID. B12 was normal in May 2019. Previous provider notes indicated possible relation to his back. Previously tried Gabapentin and requested increase lyrica dose over email, but also reported previous peripheral edema, which can be related to lyrica. He denies improvement with Lyrica 200 mg BID. He still has pain on bilateral soles of feet and describes it as "walking on blisters" and burning.   Peripheral Edema.  He wears compression stockings intermittently when he drives, but with minimal relief. The stockings only move the swelling upwards. He denies chest pain and shortness of breath while wearing them.  We discussed neurology or pain medicine referral.  He previously has myocardial effusion testing with EF 57% in 2003. No recent testing noted.   Sciatic back pain He sometimes experiences back pain that radiated to his left lower limb  The pain is shooting in nature and is worse by walking. He denies leg weakness. He can feel the pedals well when he drives. He takes Flexeril once a day at bedtime and not before driving, He denies feeling tired or sleepy when he wakes up after taking Flexeril the night before. He previously tried injections. He never had surgeries.  He was seen on narcotic pain medicine, not currently.  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  As above he denies any sedation with use of Lyrica during the day, or any sedation in the morning after using Flexeril the night before.  Does not take Flexeril when driving.   Patient Active Problem List   Diagnosis Date Noted  . Essential hypertension 03/31/2018  . Peripheral neuropathy 03/31/2018  . Hx of adenomatous colonic polyps 12/15/2017  . Graves disease 10/04/2016   Past Medical History:  Diagnosis Date  . Arthritis    in back  . Blood  clot in vein 1995   bil legs  . Bulging lumbar disc   . Cataract    mild  . Cellulitis 2012   forehead  . DDD (degenerative disc disease), lumbar   . Graves disease   . Hx of adenomatous colonic polyps 12/15/2017  . Hyperlipidemia   . Hypertension   . Thyrotoxicosis without thyroid storm    Past Surgical History:  Procedure Laterality Date  . LUMBAR EPIDURAL INJECTION     No Known Allergies Prior to Admission medications   Medication Sig Start Date End Date Taking? Authorizing Provider  acetaminophen (TYLENOL) 325 MG tablet Take 650 mg by mouth every 6 (six) hours as needed.   Yes [provider]  aspirin 81 MG chewable tablet Chew 81 mg by mouth daily.   Yes [provider]  atenolol (TENORMIN) 100 MG tablet Take 1 tablet (100 mg total) daily by mouth. 09/19/17  Yes Philemon Kingdom, MD  atorvastatin (LIPITOR) 20 MG tablet Take 1 tablet (20 mg total) by mouth daily. 03/13/18  Yes Tereasa Coop, PA-C  chlorthalidone (HYGROTON) 25 MG tablet Take 1 tablet (25 mg total) by mouth daily. For swelling 11/17/18  Yes Wendie Agreste, MD  cyclobenzaprine (FLEXERIL) 10 MG tablet Take 1 tablet (10 mg total) by mouth at bedtime. For nerve pain. 03/10/18  Yes Tereasa Coop, PA-C  doxycycline (ADOXA) 100 MG tablet Take 100 mg by mouth daily.    Yes [provider]  methimazole (TAPAZOLE) 5 MG tablet Take 5 mg in a.m. 09/21/18  Yes Philemon Kingdom, MD  pantoprazole (PROTONIX) 40 MG tablet Take 1 tablet (40 mg total) by mouth daily before breakfast. 12/09/17  Yes Gatha Mayer, MD  pregabalin (LYRICA) 200 MG capsule Take 1 capsule (200 mg total) by mouth 2 (two) times daily. Do not take gabapentin while taking this medication. 09/29/18  Yes Posey Boyer, MD  triamcinolone cream (KENALOG) 0.1 % Apply 1 application topically 2 (two) times daily. 09/29/18  Yes Posey Boyer, MD   Social History   Socioeconomic History  . Marital status: Widowed    Spouse name:  Not on file  . Number of children: Not on file  . Years of education: Not on file  . Highest education level: Not on file  Occupational History  . Not on file  Social Needs  . Financial resource strain: Not on file  . Food insecurity:    Worry: Not on file    Inability: Not on file  . Transportation needs:    Medical: Not on file    Non-medical: Not on file  Tobacco Use  . Smoking status: Former Smoker    Last attempt to quit: 12/03/2011    Years since quitting: 6.9  . Smokeless tobacco: Never Used  . Tobacco comment: occasional vaping  Substance  and Sexual Activity  . Alcohol use: Yes    Alcohol/week: 8.0 - 10.0 standard drinks    Types: 8 - 10 Standard drinks or equivalent per week    Comment: , whiskey, Burbon, 30 drinks in a months time  . Drug use: No  . Sexual activity: Not on file  Lifestyle  . Physical activity:    Days per week: Not on file    Minutes per session: Not on file  . Stress: Not on file  Relationships  . Social connections:    Talks on phone: Not on file    Gets together: Not on file    Attends religious service: Not on file    Active member of club or organization: Not on file    Attends meetings of clubs or organizations: Not on file    Relationship status: Not on file  . Intimate partner violence:    Fear of current or ex partner: Not on file    Emotionally abused: Not on file    Physically abused: Not on file    Forced sexual activity: Not on file  Other Topics Concern  . Not on file  Social History Narrative   Patient is widowed.  He has 1 daughter.  He is a Programmer, systems.  He is vapes.  2-3 drinks of bourbon daily when not driving.  4-5 caffeinated beverages daily.  No drug use.      11/02/2017    Review of Systems As HPI    Objective:    Physical Exam  Constitutional: He is oriented to person, place, and time. He appears well-developed and well-nourished.  HENT:  Head: Normocephalic and atraumatic.  Eyes: Pupils are  equal, round, and reactive to light. EOM are normal.  Neck: No JVD present. Carotid bruit is not present.  Cardiovascular: Normal rate, regular rhythm and normal heart sounds.  No murmur heard. Pulmonary/Chest: Effort normal and breath sounds normal. He has no rales.  Musculoskeletal:        General: Edema (min/trace at ankles, nonpitting. ) present.  Neurological: He is alert and oriented to person, place, and time.  Skin: Skin is warm and dry.  Psychiatric: He has a normal mood and affect.  Vitals reviewed.  Vitals:   11/29/18 1529  BP: 122/73  Pulse: 61  Resp: 16  Temp: 97.8 F (36.6 C)  TempSrc: Oral  SpO2: 98%  Weight: 281 lb (127.5 kg)  Height: 6' 2"  (1.88 m)   Lower extremities: 1+ non-pitting edema in his lower limbs. Mild tenderness in toes Neuro: can heel and toe walk independently     Assessment & Plan:  KAO CONRY is a 63 y.o. male Essential hypertension - Plan: Comprehensive metabolic panel, Ambulatory referral to Cardiology  -Stable readings on current regimen, no changes.  Labs pending.  Will refer to cardiology to discuss peripheral edema further as below.  Hyperlipidemia, unspecified hyperlipidemia type - Plan: Comprehensive metabolic panel, Lipid panel  -Check labs, tolerating current regimen  Hyponatremia  -Repeat testing.  Discussed potential risk for electrolyte imbalance with furosemide, so would want to monitor closely if he does need that in the future.  Chronic back pain, unspecified back location, unspecified back pain laterality - Plan: Ambulatory referral to Spine Surgery Other polyneuropathy  -Suspect neuropathy due to chronic back pain, disc disease as reported.  Has not met with back specialist recently to explore options, potentially consider epidural steroid injection versus updated imaging versus trial of PT to see if  that would lessen the need for medication.    - denied any sedation with current medication regimen, sensate to feet,  strength intact with heel and toe walk strength sufficient for operation of vehicle based on my exam.  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  -Continue Lyrica same dose for now, Flexeril at bedtime if needed.  Could also consider meeting with pain management specialist to look at other treatment options if needed.  Peripheral edema - Plan: Ambulatory referral to Cardiology  -Refer to cardiology for further discussion on work-up of peripheral edema.  Did not change Lyrica dose at this time with minimal change in symptoms and potential risk for further edema.  Compressive stockings recommended.    No orders of the defined types were placed in this encounter.  Patient Instructions   For chronic back pain and peripheral neuropathy I would like you to meet with a back specialist first to see if there are other options on treating back pain which may be related to the pain in your feet or peripheral neuropathy.  Okay to continue Lyrica same dose for now as I am concerned a higher dose could cause more side effects including worsening swelling.  Okay to take the Flexeril at night as long as it is not causing any daytime sedation.  Do not take Flexeril or other sedating medications when driving.   I would recommend wearing compression stocking when driving. I will check sodium level again today, but may need to decrease salt intake to help with swelling. See info on peripheral edema below. I will also refer you to cardiology to decide on updated echo of heart if needed.   Depending on blood work, we can discuss changing back to lasix from chlorthalidone.   Please follow up in next few weeks to discuss labs and swelling plan further. Thanks for coming in today.   If you have lab work done today you will be contacted with your lab results within the next 2 weeks.  If you have not heard from [place] then please contact us. The fastest way to get your results is to register for  My Chart.   IF you received an x-Price today, you will receive an invoice from [facility]. Please contact [facility] at [phone] with questions or concerns regarding your invoice.   IF you received labwork today, you will receive an invoice from [facility]. Please contact [facility] at [phone] with questions or concerns regarding your invoice.   Our billing staff will not be able to assist you with questions regarding bills from these companies.  You will be contacted with the lab results as soon as they are available. The fastest way to get your results is to activate your My Chart account. Instructions are located on the last page of this paperwork. If you have not heard from [place] regarding the results in 2 weeks, please contact this office.          Dierdre Searles Dweik am acting as scribe for Jeffrey Price. I personally performed the services described in this documentation, which was scribed in my presence. The recorded information has been reviewed and considered for accuracy and completeness, addended by me as needed, and agree with information above.  Signed,   Merri Ray, MD [facility].  12/04/18 12:00 PM

## 2018-11-30 LAB — COMPREHENSIVE METABOLIC PANEL
ALT: 33 IU/L (ref 0–44)
AST: 23 IU/L (ref 0–40)
Albumin/Globulin Ratio: 1.5 (ref 1.2–2.2)
Albumin: 4.4 g/dL (ref 3.8–4.8)
Alkaline Phosphatase: 127 IU/L — ABNORMAL HIGH (ref 39–117)
BUN/Creatinine Ratio: 15 (ref 10–24)
BUN: 14 mg/dL (ref 8–27)
Bilirubin Total: 0.4 mg/dL (ref 0.0–1.2)
CO2: 25 mmol/L (ref 20–29)
Calcium: 9.8 mg/dL (ref 8.6–10.2)
Chloride: 92 mmol/L — ABNORMAL LOW (ref 96–106)
Creatinine, Ser: 0.95 mg/dL (ref 0.76–1.27)
GFR calc Af Amer: 99 mL/min/{1.73_m2} (ref >59)
GFR, EST NON AFRICAN AMERICAN: 85 mL/min/{1.73_m2} (ref >59)
GLUCOSE: 97 mg/dL (ref 65–99)
Globulin, Total: 3 g/dL (ref 1.5–4.5)
Potassium: 3.6 mmol/L (ref 3.5–5.2)
Sodium: 138 mmol/L (ref 134–144)
TOTAL PROTEIN: 7.4 g/dL (ref 6.0–8.5)

## 2018-11-30 LAB — LIPID PANEL
CHOL/HDL RATIO: 4.6 ratio (ref 0.0–5.0)
Cholesterol, Total: 151 mg/dL (ref 100–199)
HDL: 33 mg/dL — ABNORMAL LOW (ref >39)
LDL CALC: 49 mg/dL (ref 0–99)
Triglycerides: 347 mg/dL — ABNORMAL HIGH (ref 0–149)
VLDL Cholesterol Cal: 69 mg/dL — ABNORMAL HIGH (ref 5–40)

## 2018-12-01 NOTE — Progress Notes (Signed)
Cardiology Office Note   Date:  12/04/2018   ID:  Jeffrey Price, DOB 29-Jan-1956, MRN [id number]  PCP:  Wendie Agreste, MD  Cardiologist:   Jenkins Rouge, MD   No chief complaint on file.     History of Present Illness: Jeffrey Price is a 63 y.o. male who presents for consultation regarding edema and HTN.  Referred by Dr Carlota Raspberry.  Long standing HTN. Been on diuretic and beta blockers. He has thyroid disease and HLD as well Activity Limited by back pain. And arthritis  Uses lyrica for neuropathy   Labs fine Cr .90  LDL 49 TSH 2.5   Drives a truck.  Has bad neuropathy and back pain that limits activity Has gained 50 lbs since being Rx for hyperthyroidism Previous history of DVT in both LE;s   Widowed 3 years ago wife had lung cancer. Has long time girlfriend One daughter and some step children His step son is Carlean Purl who played ball with my son Olevia Perches     Past Medical History:  Diagnosis Date  . Arthritis    in back  . Blood clot in vein 1995   bil legs  . Bulging lumbar disc   . Cataract    mild  . Cellulitis 2012   forehead  . DDD (degenerative disc disease), lumbar   . Graves disease   . Hx of adenomatous colonic polyps 12/15/2017  . Hyperlipidemia   . Hypertension   . Thyrotoxicosis without thyroid storm     Past Surgical History:  Procedure Laterality Date  . LUMBAR EPIDURAL INJECTION       Current Outpatient Medications  Medication Sig Dispense Refill  . acetaminophen (TYLENOL) 325 MG tablet Take 650 mg by mouth every 6 (six) hours as needed.    Marland Kitchen aspirin 81 MG chewable tablet Chew 81 mg by mouth daily.    Marland Kitchen atenolol (TENORMIN) 100 MG tablet Take 1 tablet (100 mg total) daily by mouth. 90 tablet 3  . atorvastatin (LIPITOR) 20 MG tablet Take 1 tablet (20 mg total) by mouth daily. 90 tablet 3  . cyclobenzaprine (FLEXERIL) 10 MG tablet Take 1 tablet (10 mg total) by mouth at bedtime. For nerve pain. 90 tablet 3  . doxycycline (ADOXA) 100 MG tablet  Take 100 mg by mouth daily.     . methimazole (TAPAZOLE) 5 MG tablet Take 5 mg in a.m. 90 tablet 5  . pantoprazole (PROTONIX) 40 MG tablet Take 1 tablet (40 mg total) by mouth daily before breakfast. 90 tablet 3  . pregabalin (LYRICA) 200 MG capsule Take 1 capsule (200 mg total) by mouth 2 (two) times daily. Do not take gabapentin while taking this medication. 180 capsule 1  . triamcinolone cream (KENALOG) 0.1 % Apply 1 application topically 2 (two) times daily. 60 g 2   No current facility-administered medications for this visit.     Allergies:   Patient has no known allergies.    Social History:  The patient  reports that he quit smoking about 7 years ago. He has never used smokeless tobacco. He reports current alcohol use of about 8.0 - 10.0 standard drinks of alcohol per week. He reports that he does not use drugs.   Family History:  The patient's family history includes Parkinson's disease in his father; Thyroid disease in his mother.    ROS:  Please see the history of present illness.   Otherwise, review of systems are positive for none.  All other systems are reviewed and negative.    PHYSICAL EXAM: VS:  BP 124/78   Pulse (!) 55   Ht 6\' 2"  (1.88 m)   Wt 280 lb (127 kg)   BMI 35.95 kg/m  , BMI Body mass index is 35.95 kg/m. Affect appropriate Healthy:  appears stated age 83: normal Neck supple with no adenopathy JVP normal no bruits no thyromegaly Lungs clear with no wheezing and good diaphragmatic motion Heart:  S1/S2 no murmur, no rub, gallop or click PMI normal Abdomen: benighn, BS positve, no tenderness, no AAA no bruit.  No HSM or HJR Distal pulses intact with no bruits No edema Neuro non-focal Skin warm and dry No muscular weakness    EKG:  NSR rate 55 non specific ST T wave changes    Recent Labs: 09/29/2018: TSH 2.580 11/29/2018: ALT 33; BUN 14; Creatinine, Ser 0.95; Potassium 3.6; Sodium 138    Lipid Panel    Component Value Date/Time   CHOL  151 11/29/2018 1715   TRIG 347 (H) 11/29/2018 1715   HDL 33 (L) 11/29/2018 1715   CHOLHDL 4.6 11/29/2018 1715   LDLCALC 49 11/29/2018 1715      Wt Readings from Last 3 Encounters:  12/04/18 280 lb (127 kg)  11/29/18 281 lb (127.5 kg)  09/29/18 280 lb 12.8 oz (127.4 kg)      Other studies Reviewed: Additional studies/ records that were reviewed today include: notes from primary labs .    ASSESSMENT AND PLAN:  1.  HTN:  Well controlled.  Continue current medications and low sodium Dash type diet.   2. Edema:  This is from obesity and varicosities with history of LE DVT' s Hygroton not working as well as lasix had will change back to 40 mg daily F/U BMET and BNP Will check TTE to make sure RV/LV function  Is normal and LE venous duplex to assess reflux  3. HLD:  Continue statin labs with primary 4. Thyroid:  Normal TSH continue Tapazole f/u endocrine  5 Neuropathy:  Continue lyrica needs f/u neuro as it is not very effective   Current medicines are reviewed at length with the patient today.  The patient does not have concerns regarding medicines.  The following changes have been made:  Lasix 40 mg daily   Labs/ tests ordered today include: TTE, LE venous duplex BMET BNP 4 weeks   Orders Placed This Encounter  Procedures  . EKG 12-Lead     Disposition:   FU with cardiology 3 months     Signed, Jenkins Rouge, MD  12/04/2018 9:26 AM    [address], [place], [place]  [address] Phone: [phone]; Fax: [phone]

## 2018-12-04 ENCOUNTER — Ambulatory Visit: Payer: BLUE CROSS/BLUE SHIELD | Admitting: Cardiovascular Disease

## 2018-12-04 ENCOUNTER — Encounter: Payer: Self-pay | Admitting: Family Medicine

## 2018-12-04 VITALS — BP 124/78 | HR 55 | Ht 74.0 in | Wt 280.0 lb

## 2018-12-04 DIAGNOSIS — E785 Hyperlipidemia, unspecified: Secondary | ICD-10-CM

## 2018-12-04 DIAGNOSIS — R0600 Dyspnea, unspecified: Secondary | ICD-10-CM

## 2018-12-04 DIAGNOSIS — R609 Edema, unspecified: Secondary | ICD-10-CM

## 2018-12-04 DIAGNOSIS — I10 Essential (primary) hypertension: Secondary | ICD-10-CM

## 2018-12-04 DIAGNOSIS — Z86718 Personal history of other venous thrombosis and embolism: Secondary | ICD-10-CM

## 2018-12-04 MED ORDER — FUROSEMIDE 40 MG PO TABS: 40.0000 mg | ORAL_TABLET | Freq: Every day | ORAL | 3 refills | Status: DC

## 2018-12-04 MED FILL — FUROSEMIDE 40 MG TAB: 40 | 90 days supply | Qty: 90 | Fill #0

## 2018-12-04 NOTE — Patient Instructions (Addendum)
Medication Instructions:  Your physician has recommended you make the following change in your medication:  1-STOP Chlorthalidone 2-START Lasix 40 mg by mouth daily  If you need a refill on your cardiac medications before your next appointment, please call your pharmacy.   Lab work: Your physician recommends that you return for lab work in: 4 weeks for BMET and BNP.  If you have labs (blood work) drawn today and your tests are completely normal, you will receive your results only by: Marland Kitchen MyChart Message (if you have MyChart) OR . A paper copy in the mail If you have any lab test that is abnormal or we need to change your treatment, we will call you to review the results.  Testing/Procedures: Your physician has requested that you have an echocardiogram. Echocardiography is a painless test that uses sound waves to create images of your heart. It provides your doctor with information about the size and shape of your heart and how well your heart's chambers and valves are working. This procedure takes approximately one hour. There are no restrictions for this procedure.  Your physician has requested that you have a lower extremity venous duplex. This test is an ultrasound of the veins in the legs . It looks at venous blood flow that carries blood from the heart to the legs. Allow one hour for a Lower Venous exam. There are no restrictions or special instructions.  Follow-Up: At [facility], you and your health needs are our priority.  As part of our continuing mission to provide you with exceptional heart care, we have created designated Provider Care Teams.  These Care Teams include your primary Cardiologist (physician) and Advanced Practice Providers (APPs -  Physician Assistants and Nurse Practitioners) who all work together to provide you with the care you need, when you need it. You will need a follow up appointment in 3 months.  You may see Dr. Johnsie Cancel or one of the following Advanced  Practice Providers on your designated Care Team:   Truitt Merle, NP Cecilie Kicks, NP . Kathyrn Drown, NP

## 2018-12-06 ENCOUNTER — Other Ambulatory Visit: Payer: Self-pay | Admitting: Cardiovascular Disease

## 2018-12-06 DIAGNOSIS — Z86718 Personal history of other venous thrombosis and embolism: Secondary | ICD-10-CM

## 2018-12-06 DIAGNOSIS — R609 Edema, unspecified: Secondary | ICD-10-CM

## 2018-12-06 MED FILL — ATORVASTATIN CALCIUM 20 MG: 20 | 90 days supply | Qty: 90 | Fill #3

## 2018-12-06 MED FILL — PANTOPRAZOLE SOD DR 40 MG T: 40 | 90 days supply | Qty: 90 | Fill #3

## 2018-12-11 ENCOUNTER — Ambulatory Visit (HOSPITAL_COMMUNITY)
Admission: RE | Admit: 2018-12-11 | Discharge: 2018-12-11 | Disposition: A | Discharge status: Home / Self Care | Payer: BLUE CROSS/BLUE SHIELD | Source: Ambulatory Visit | Attending: Cardiovascular Disease | Admitting: Cardiovascular Disease

## 2018-12-11 ENCOUNTER — Ambulatory Visit (HOSPITAL_BASED_OUTPATIENT_CLINIC_OR_DEPARTMENT_OTHER): Payer: BLUE CROSS/BLUE SHIELD

## 2018-12-11 ENCOUNTER — Other Ambulatory Visit (HOSPITAL_COMMUNITY): Payer: BLUE CROSS/BLUE SHIELD

## 2018-12-11 ENCOUNTER — Encounter (HOSPITAL_COMMUNITY): Payer: BLUE CROSS/BLUE SHIELD

## 2018-12-11 DIAGNOSIS — I10 Essential (primary) hypertension: Secondary | ICD-10-CM | POA: Insufficient documentation

## 2018-12-11 DIAGNOSIS — R609 Edema, unspecified: Secondary | ICD-10-CM

## 2018-12-11 DIAGNOSIS — R0600 Dyspnea, unspecified: Secondary | ICD-10-CM

## 2018-12-11 DIAGNOSIS — Z86718 Personal history of other venous thrombosis and embolism: Secondary | ICD-10-CM

## 2018-12-11 DIAGNOSIS — E785 Hyperlipidemia, unspecified: Secondary | ICD-10-CM

## 2018-12-15 ENCOUNTER — Encounter (HOSPITAL_COMMUNITY): Payer: BLUE CROSS/BLUE SHIELD

## 2018-12-15 ENCOUNTER — Other Ambulatory Visit (HOSPITAL_COMMUNITY): Payer: BLUE CROSS/BLUE SHIELD

## 2018-12-18 MED FILL — methIMAzole 5 MG TABS: 5 | 90 days supply | Qty: 90 | Fill #1

## 2018-12-21 ENCOUNTER — Telehealth: Payer: Self-pay | Admitting: *Deleted

## 2018-12-21 ENCOUNTER — Other Ambulatory Visit: Payer: Self-pay | Admitting: *Deleted

## 2018-12-21 DIAGNOSIS — G629 Polyneuropathy, unspecified: Secondary | ICD-10-CM

## 2018-12-21 MED ORDER — PREGABALIN 200 MG PO CAPS: 200.0000 mg | ORAL_CAPSULE | Freq: Two times a day (BID) | ORAL | 0 refills | Status: DC

## 2018-12-21 MED FILL — PREGABALIN 200 MG CAPS: 200 | 90 days supply | Qty: 180 | Fill #0

## 2018-12-21 NOTE — Telephone Encounter (Signed)
Spoke with patient advised dr. Carlota Raspberry is calling in prescription he will pick up.   Patient voiced understanding.

## 2018-12-21 NOTE — Telephone Encounter (Signed)
Dr. Carlota Raspberry,  Jeffrey Price from [facility] called this patient is 7 days short on his Lyrica.   I spoke with patient he states that when Dr. Linna Darner saw him on 11/22 he verbally told him he could try 300 mg.  You did see patient on 11/29/18 and advised to stay at 200 mg which patient has since been doing but is now 7 days short .I have pended Lyrica prescription he has an appointment scheduled with you for 12/29/2018 for a follow up.   Can  You sign if it is ok.

## 2018-12-21 NOTE — Addendum Note (Signed)
Addended by: Merri Ray R on: 12/21/2018 03:01 PM   Modules accepted: Orders

## 2018-12-29 ENCOUNTER — Other Ambulatory Visit: Payer: Self-pay

## 2018-12-29 ENCOUNTER — Ambulatory Visit: Payer: BLUE CROSS/BLUE SHIELD | Admitting: Family Medicine

## 2018-12-29 ENCOUNTER — Encounter: Payer: Self-pay | Admitting: Family Medicine

## 2018-12-29 VITALS — BP 116/73 | HR 71 | Temp 98.0°F | Ht 74.0 in | Wt 285.2 lb

## 2018-12-29 DIAGNOSIS — M549 Dorsalgia, unspecified: Secondary | ICD-10-CM | POA: Diagnosis not present

## 2018-12-29 DIAGNOSIS — E119 Type 2 diabetes mellitus without complications: Secondary | ICD-10-CM

## 2018-12-29 DIAGNOSIS — G6289 Other specified polyneuropathies: Secondary | ICD-10-CM

## 2018-12-29 DIAGNOSIS — E785 Hyperlipidemia, unspecified: Secondary | ICD-10-CM | POA: Diagnosis not present

## 2018-12-29 DIAGNOSIS — R609 Edema, unspecified: Secondary | ICD-10-CM

## 2018-12-29 DIAGNOSIS — G8929 Other chronic pain: Secondary | ICD-10-CM

## 2018-12-29 MED ORDER — CYCLOBENZAPRINE HCL 10 MG PO TABS: 10.0000 mg | ORAL_TABLET | Freq: Every day | ORAL | 1 refills | Status: DC

## 2018-12-29 MED ORDER — ATORVASTATIN CALCIUM 20 MG PO TABS: 20.0000 mg | ORAL_TABLET | Freq: Every day | ORAL | 1 refills | Status: DC

## 2018-12-29 NOTE — Patient Instructions (Addendum)
I am sorry the previous referral did not go as planned but I placed a new referral to a back specialist, Dr. Lynann Bologna.  Please let me know if there are any questions regarding that referral or you run into any difficulties.  They should be calling you within the next 2 weeks but let me know if you have not heard from them during that time.  Okay to continue Flexeril at bedtime for now.  If back specialist does not think your feet symptoms are due to the back, we could also have neurology see you to evaluate for other causes of peripheral neuropathy.  Okay to continue Lyrica for now.  Continue compressive stockings for swelling, and fluid pill as recommended by cardiology.  I will check the electrolytes as discussed with cardiology.  Additionally I have referred you to a vascular specialist given the symptoms in your lower legs to see if there are other recommendations from their standpoint.  Would recommend discussing foot symptoms with them as well to see if that might be related to circulation.  Overall cholesterol looks good on recent check.  Triglycerides were elevated but the LDL looks good at 49.  Continue Lipitor same dose for now.  I have you following up in about 3 months but I am happy to see you sooner if needed.  Please let me know if any medications are running out during that time.  Thank you for coming in today.   Return to the clinic or go to the nearest emergency room if any of your symptoms worsen or new symptoms occur.   If you have lab work done today you will be contacted with your lab results within the next 2 weeks.  If you have not heard from [place] then please contact us. The fastest way to get your results is to register for My Chart.   IF you received an x-ray today, you will receive an invoice from [facility]. Please contact [facility] at [phone] with questions or concerns regarding your invoice.   IF you received labwork today, you will receive an  invoice from [facility]. Please contact [facility] at [phone] with questions or concerns regarding your invoice.   Our billing staff will not be able to assist you with questions regarding bills from these companies.  You will be contacted with the lab results as soon as they are available. The fastest way to get your results is to activate your My Chart account. Instructions are located on the last page of this paperwork. If you have not heard from [place] regarding the results in 2 weeks, please contact this office.

## 2018-12-29 NOTE — Progress Notes (Signed)
Subjective:    Patient ID: Jeffrey Price, male    DOB: 08-26-1956, 63 y.o.   MRN: [id number]  HPI Jeffrey Price is a 63 y.o. male Presents today for: Chief Complaint  Patient presents with  . Leg Swelling    f/u   . Medication Refill    flexeril, lipitor &   (lyrica just recieved a 90 da. no refills on file)   Peripheral edema:  -See last office visit in January.  Refer to cardiology to discuss further work-up of peripheral edema.  We decided to remain on same dose of Lyrica as he had minimal change in symptoms but did discuss risk of peripheral edema with that medication. Saw Dr. Johnsie Cancel 1/27. Stopped chlorthalidone, started lasix 67m qd.  Echo 2/3: EF normal, AV sclerosis but not stenosis.  LE U/s 2/3: no DVT, abnormal reflux in veins. Recommended BMP and BNP.  No new dyspnea - only with exertion. Not at rest, no nocturnal dyspnea.   Chronic back pain with peripheral edema.   -With peripheral neuropathy likely due to degenerative disc disease.  He had not met with back specialist recently to discuss potential options including possible injections versus PT and so was referred last visit to neurosurgery.  Continue Lyrica same dose, as well as Flexeril at bedtime if needed.  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 - not taking flexeril within 10 hours of driving.  Still hurts to put pressure on feet. Base of feet. b12 and tsh nl prior in 2019.  Wearing compression stockings.  Hair in lower legs decreased past 5 years.  Feet look more purple at times- he just noticed in office.   Hyperlipidemia:  Lab Results  Component Value Date   CHOL 151 11/29/2018   HDL 33 (L) 11/29/2018   LDLCALC 49 11/29/2018   TRIG 347 (H) 11/29/2018   CHOLHDL 4.6 11/29/2018   Lab Results  Component Value Date   ALT 33 11/29/2018   AST 23 11/29/2018   ALKPHOS 127 (H) 11/29/2018   BILITOT 0.4 11/29/2018      Patient Active Problem List   Diagnosis Date Noted  . Essential hypertension 03/31/2018  . Peripheral neuropathy 03/31/2018  . Hx of adenomatous colonic polyps 12/15/2017  . Graves disease 10/04/2016   Past Medical History:  Diagnosis Date  . Arthritis    in back  . Blood clot in vein 1995   bil legs  . Bulging lumbar disc   . Cataract    mild  . Cellulitis 2012   forehead  . DDD (degenerative disc disease), lumbar   . Graves disease   . Hx of adenomatous colonic polyps 12/15/2017  . Hyperlipidemia   . Hypertension   . Thyrotoxicosis without thyroid storm    Past Surgical History:  Procedure Laterality Date  . LUMBAR EPIDURAL INJECTION     No Known Allergies Prior to Admission medications   Medication Sig Start Date End Date Taking? Authorizing Provider  acetaminophen (TYLENOL) 325 MG tablet Take 650 mg by mouth every 6 (six) hours as needed.   Yes [provider]  aspirin 81 MG chewable tablet Chew 81 mg by mouth daily.   Yes [provider]  atenolol (TENORMIN) 100 MG tablet Take 1 tablet (100 mg total) daily by mouth. 09/19/17  Yes GPhilemon Kingdom MD  atorvastatin (LIPITOR) 20 MG tablet Take 1 tablet (20 mg total) by mouth daily.  03/13/18  Yes Tereasa Coop, PA-C  cyclobenzaprine (FLEXERIL) 10 MG tablet Take 1 tablet (10 mg total) by mouth at bedtime. For nerve pain. 03/10/18  Yes Tereasa Coop, PA-C  doxycycline (ADOXA) 100 MG tablet Take 100 mg by mouth daily.    Yes [provider]  furosemide (LASIX) 40 MG tablet Take 1 tablet (40 mg total) by mouth daily. 12/04/18  Yes Josue Hector, MD  methimazole (TAPAZOLE) 5 MG tablet Take 5 mg in a.m. 09/21/18  Yes Philemon Kingdom, MD  pantoprazole (PROTONIX) 40 MG tablet Take 1 tablet (40 mg total) by mouth daily before breakfast. 12/09/17  Yes Gatha Mayer, MD  pregabalin (LYRICA) 200 MG capsule Take 1 capsule (200 mg total) by mouth 2 (two) times daily. Do not take gabapentin while  taking this medication. 12/21/18  Yes Wendie Agreste, MD  triamcinolone cream (KENALOG) 0.1 % Apply 1 application topically 2 (two) times daily. 09/29/18  Yes Posey Boyer, MD   Social History   Socioeconomic History  . Marital status: Widowed    Spouse name: Not on file  . Number of children: Not on file  . Years of education: Not on file  . Highest education level: Not on file  Occupational History  . Not on file  Social Needs  . Financial resource strain: Not on file  . Food insecurity:    Worry: Not on file    Inability: Not on file  . Transportation needs:    Medical: Not on file    Non-medical: Not on file  Tobacco Use  . Smoking status: Former Smoker    Last attempt to quit: 12/03/2011    Years since quitting: 7.0  . Smokeless tobacco: Never Used  . Tobacco comment: occasional vaping  Substance and Sexual Activity  . Alcohol use: Yes    Alcohol/week: 8.0 - 10.0 standard drinks    Types: 8 - 10 Standard drinks or equivalent per week    Comment: , whiskey, Burbon, 30 drinks in a months time  . Drug use: No  . Sexual activity: Not on file  Lifestyle  . Physical activity:    Days per week: Not on file    Minutes per session: Not on file  . Stress: Not on file  Relationships  . Social connections:    Talks on phone: Not on file    Gets together: Not on file    Attends religious service: Not on file    Active member of club or organization: Not on file    Attends meetings of clubs or organizations: Not on file    Relationship status: Not on file  . Intimate partner violence:    Fear of current or ex partner: Not on file    Emotionally abused: Not on file    Physically abused: Not on file    Forced sexual activity: Not on file  Other Topics Concern  . Not on file  Social History Narrative   Patient is widowed.  He has 1 daughter.  He is a Programmer, systems.  He is vapes.  2-3 drinks of bourbon daily when not driving.  4-5 caffeinated beverages  daily.  No drug use.      11/02/2017     Review of Systems Per HPI.     Objective:   Physical Exam Vitals signs reviewed.  Constitutional:      Appearance: He is well-developed.  HENT:     Head: Normocephalic and atraumatic.  Eyes:     Pupils: Pupils are equal, round, and reactive to light.  Neck:     Vascular: No carotid bruit or JVD.  Cardiovascular:     Rate and Rhythm: Normal rate and regular rhythm.     Heart sounds: Normal heart sounds. No murmur.  Pulmonary:     Effort: Pulmonary effort is normal.     Breath sounds: Normal breath sounds. No rales.  Musculoskeletal:     Right lower leg: Edema (Bilateral 1+ edema to mid tibia.  Cap refill approximately 2 seconds at toes without apparent cyanosis.) present.     Left lower leg: Edema present.  Skin:    General: Skin is warm and dry.  Neurological:     Mental Status: He is alert and oriented to person, place, and time.    Vitals:   12/29/18 1532  BP: 116/73  Pulse: 71  Temp: 98 F (36.7 C)  TempSrc: Oral  SpO2: 93%  Weight: 285 lb 3.2 oz (129.4 kg)  Height: 6' 2"  (1.88 m)   Results for orders placed or performed in visit on 46/80/32  Basic metabolic panel  Result Value Ref Range   Glucose 133 (H) 65 - 99 mg/dL   BUN 14 8 - 27 mg/dL   Creatinine, Ser 1.05 0.76 - 1.27 mg/dL   GFR calc non Af Amer 76 >59 mL/min/1.73   GFR calc Af Amer 88 >59 mL/min/1.73   BUN/Creatinine Ratio 13 10 - 24   Sodium 138 134 - 144 mmol/L   Potassium 4.0 3.5 - 5.2 mmol/L   Chloride 101 96 - 106 mmol/L   CO2 22 20 - 29 mmol/L   Calcium 9.1 8.6 - 10.2 mg/dL  Pro b natriuretic peptide  Result Value Ref Range   NT-Pro BNP 76 0 - 210 pg/mL       Assessment & Plan:   SHANNA STRENGTH is a 63 y.o. male Peripheral edema - Plan: Basic metabolic panel, Pro b natriuretic peptide, Ambulatory referral to Vascular Surgery  -Suspect component of peripheral vascular disease given recent echo and venous studies with reflux.  -Persistent  distal neuropathic symptoms, could be related in part due to edema versus lumbar spine disease.  -Continue compressive stockings, Lasix at same dose, and refer to vascular for further recommendations  Chronic back pain, unspecified back location, unspecified back pain laterality - Plan: Ambulatory referral to Spine Surgery Other polyneuropathy - Plan: Ambulatory referral to Spine Surgery, cyclobenzaprine (FLEXERIL) 10 MG tablet  -Continue Flexeril.  Will refer to orthopedic back specialist to decide on next step in treatment  -Recommend he discuss foot symptoms with spine specialist.  If not thought to be due to back, can refer to neurology for further evaluation and treatment options.  Continue Lyrica same dose for now  Hyperlipidemia, unspecified hyperlipidemia type - Plan: atorvastatin (LIPITOR) 20 MG tablet  -Tolerating Lipitor, continue same dose.  Hyperglycemia: Previous A1c approximate 9 months ago at prediabetes range.  We will have lab add A1c to previous blood work if possible.  Meds ordered this encounter  Medications  . atorvastatin (LIPITOR) 20 MG tablet    Sig: Take 1 tablet (20 mg total) by mouth daily.    Dispense:  90 tablet    Refill:  1  . cyclobenzaprine (FLEXERIL) 10 MG tablet    Sig: Take 1 tablet (10 mg total) by mouth at bedtime. For nerve pain.    Dispense:  90 tablet    Refill:  1  Patient Instructions   I am sorry the previous referral did not go as planned but I placed a new referral to a back specialist, Dr. Lynann Bologna.  Please let me know if there are any questions regarding that referral or you run into any difficulties.  They should be calling you within the next 2 weeks but let me know if you have not heard from them during that time.  Okay to continue Flexeril at bedtime for now.  If back specialist does not think your feet symptoms are due to the back, we could also have neurology see you to evaluate for other causes of peripheral neuropathy.  Okay to  continue Lyrica for now.  Continue compressive stockings for swelling, and fluid pill as recommended by cardiology.  I will check the electrolytes as discussed with cardiology.  Additionally I have referred you to a vascular specialist given the symptoms in your lower legs to see if there are other recommendations from their standpoint.  Would recommend discussing foot symptoms with them as well to see if that might be related to circulation.  Overall cholesterol looks good on recent check.  Triglycerides were elevated but the LDL looks good at 49.  Continue Lipitor same dose for now.  I have you following up in about 3 months but I am happy to see you sooner if needed.  Please let me know if any medications are running out during that time.  Thank you for coming in today.   Return to the clinic or go to the nearest emergency room if any of your symptoms worsen or new symptoms occur.   If you have lab work done today you will be contacted with your lab results within the next 2 weeks.  If you have not heard from [place] then please contact us. The fastest way to get your results is to register for My Chart.   IF you received an x-ray today, you will receive an invoice from [facility]. Please contact [facility] at [phone] with questions or concerns regarding your invoice.   IF you received labwork today, you will receive an invoice from [place]. Please contact [facility] at [phone] with questions or concerns regarding your invoice.   Our billing staff will not be able to assist you with questions regarding bills from these companies.  You will be contacted with the lab results as soon as they are available. The fastest way to get your results is to activate your My Chart account. Instructions are located on the last page of this paperwork. If you have not heard from [place] regarding the results in 2 weeks, please contact this office.       Signed,   Merri Ray,  MD [facility] [place].  01/02/19 4:16 PM

## 2018-12-30 LAB — BASIC METABOLIC PANEL
BUN/Creatinine Ratio: 13 (ref 10–24)
BUN: 14 mg/dL (ref 8–27)
CO2: 22 mmol/L (ref 20–29)
Calcium: 9.1 mg/dL (ref 8.6–10.2)
Chloride: 101 mmol/L (ref 96–106)
Creatinine, Ser: 1.05 mg/dL (ref 0.76–1.27)
GFR calc Af Amer: 88 mL/min/{1.73_m2} (ref >59)
GFR calc non Af Amer: 76 mL/min/{1.73_m2} (ref >59)
Glucose: 133 mg/dL — ABNORMAL HIGH (ref 65–99)
Potassium: 4 mmol/L (ref 3.5–5.2)
SODIUM: 138 mmol/L (ref 134–144)

## 2018-12-30 LAB — PRO B NATRIURETIC PEPTIDE: NT-Pro BNP: 76 pg/mL (ref 0–210)

## 2019-01-02 ENCOUNTER — Encounter: Payer: Self-pay | Admitting: Family Medicine

## 2019-01-05 LAB — POCT GLYCOSYLATED HEMOGLOBIN (HGB A1C): Hemoglobin A1C: 6.6 % — AB (ref 4.0–5.6)

## 2019-01-05 MED FILL — CYCLOBENZAPRINE HCL 10 MG T: 10 | 90 days supply | Qty: 90 | Fill #0

## 2019-01-05 NOTE — Addendum Note (Signed)
Addended by: Burnis Kingfisher on: 01/05/2019 03:38 PM   Modules accepted: Orders

## 2019-01-12 DIAGNOSIS — M545 Low back pain: Secondary | ICD-10-CM | POA: Diagnosis not present

## 2019-01-12 MED FILL — METHYLPREDNISOLONE 4 MG TBP: 4 | 6 days supply | Qty: 21 | Fill #0

## 2019-01-12 MED FILL — METHOCARBAMOL 500 MG TABLET: 500 | 15 days supply | Qty: 60 | Fill #0

## 2019-01-17 ENCOUNTER — Other Ambulatory Visit: Payer: Self-pay | Admitting: Orthopedic Surgery

## 2019-01-17 ENCOUNTER — Other Ambulatory Visit (HOSPITAL_COMMUNITY): Payer: Self-pay | Admitting: Orthopedic Surgery

## 2019-01-17 DIAGNOSIS — M545 Low back pain, unspecified: Secondary | ICD-10-CM

## 2019-01-18 ENCOUNTER — Telehealth: Payer: Self-pay | Admitting: Family Medicine

## 2019-01-18 NOTE — Telephone Encounter (Signed)
Copied from [place] [id number]. Topic: General - Other >> Jan 18, 2019 10:12 AM Lennox Solders wrote: Reason for CRM: [facility] is calling and the pt needs a refill on doxcycline 100 mg for his acne. Pt last seen dr Carlota Raspberry on 12-29-2018. This med was last refill by dr Renne Crigler

## 2019-01-23 NOTE — Telephone Encounter (Signed)
Pt needs to schedule an apt with Dr. Carlota Raspberry due to them never discussing this medication and he has never prescribe this or can see if Dr. Renne Crigler can send it in for him.

## 2019-01-24 MED FILL — METHOCARBAMOL 500 MG TABLET: 500 | 15 days supply | Qty: 60 | Fill #1

## 2019-01-25 MED FILL — METHYLPREDNISOLONE 4 MG TBP: 4 | 6 days supply | Qty: 21 | Fill #0

## 2019-01-26 ENCOUNTER — Ambulatory Visit (HOSPITAL_COMMUNITY)
Admission: RE | Admit: 2019-01-26 | Discharge: 2019-01-26 | Disposition: A | Discharge status: Home / Self Care | Payer: BLUE CROSS/BLUE SHIELD | Source: Ambulatory Visit | Attending: Orthopedic Surgery | Admitting: Orthopedic Surgery

## 2019-01-26 ENCOUNTER — Other Ambulatory Visit: Payer: Self-pay

## 2019-01-26 ENCOUNTER — Ambulatory Visit (HOSPITAL_COMMUNITY): Admission: RE | Admit: 2019-01-26 | Payer: BLUE CROSS/BLUE SHIELD | Source: Ambulatory Visit

## 2019-01-26 DIAGNOSIS — M545 Low back pain, unspecified: Secondary | ICD-10-CM

## 2019-02-02 ENCOUNTER — Encounter: Payer: BLUE CROSS/BLUE SHIELD | Admitting: Vascular Surgery

## 2019-02-02 ENCOUNTER — Telehealth: Payer: Self-pay | Admitting: Family Medicine

## 2019-02-02 DIAGNOSIS — M48062 Spinal stenosis, lumbar region with neurogenic claudication: Secondary | ICD-10-CM | POA: Diagnosis not present

## 2019-02-02 NOTE — Telephone Encounter (Signed)
Requested medication (s) are due for refill today: Yes  Requested medication (s) are on the active medication list: Yes  Last refill:  See medication list  Future visit scheduled: Yes  Notes to clinic:  One medication is historical provider and one ordered by Dr. Renne Crigler    Requested Prescriptions  Pending Prescriptions Disp Refills   atenolol (TENORMIN) 100 MG tablet 90 tablet 3    Sig: Take 1 tablet (100 mg total) by mouth daily.     Cardiovascular:  Beta Blockers Passed - 02/02/2019  9:11 AM      Passed - Last BP in normal range    BP Readings from Last 1 Encounters:  12/29/18 116/73         Passed - Last Heart Rate in normal range    Pulse Readings from Last 1 Encounters:  12/29/18 71         Passed - Valid encounter within last 6 months    Recent Outpatient Visits          1 month ago Peripheral edema   [facility] [place], Ranell Patrick, MD   2 months ago Essential hypertension   [facility] [place] [facility], Ranell Patrick, MD   4 months ago Flu vaccine need   [facility] at [facility], Fenton Malling, MD   9 months ago Well-controlled hypertension   [facility] at [facility], Audrie Lia, PA-C   10 months ago Essential hypertension   [facility], Audrie Lia, PA-C      Future Appointments            In 1 month Wendie Agreste, MD [facility] at [facility], [facility]          doxycycline (ADOXA) 100 MG tablet      Sig: Take 1 tablet (100 mg total) by mouth daily.     Off-Protocol Failed - 02/02/2019  9:11 AM      Failed - Medication not assigned to a protocol, review manually.      Passed - Valid encounter within last 12 months    Recent Outpatient Visits          1 month ago Peripheral edema   [facility], Ranell Patrick, MD   2 months ago Essential hypertension   [facility], Ranell Patrick, MD   4 months ago Flu vaccine need   Primary Care at [facility], Fenton Malling, MD   9 months ago Well-controlled  hypertension   [facility] at [place], PA-C   10 months ago Essential hypertension   [facility], Audrie Lia, PA-C      Future Appointments            In 1 month Carlota Raspberry Ranell Patrick, MD [facility] at [place], [facility]

## 2019-02-06 ENCOUNTER — Other Ambulatory Visit: Payer: Self-pay | Admitting: Family Medicine

## 2019-02-06 ENCOUNTER — Encounter: Payer: Self-pay | Admitting: Family Medicine

## 2019-02-06 MED ORDER — ATENOLOL 100 MG PO TABS: 100.0000 mg | ORAL_TABLET | Freq: Every day | ORAL | 0 refills | Status: DC

## 2019-02-06 NOTE — Telephone Encounter (Signed)
Rx sent to pharmacy   

## 2019-02-06 NOTE — Telephone Encounter (Signed)
Pt called and left a voice mail to check status. Pharmacy does not have medication. Please advise

## 2019-02-07 ENCOUNTER — Other Ambulatory Visit: Payer: Self-pay | Admitting: *Deleted

## 2019-02-07 MED ORDER — ATENOLOL 100 MG PO TABS: 100.0000 mg | ORAL_TABLET | Freq: Every day | ORAL | 0 refills | Status: DC

## 2019-02-07 MED FILL — ATENOLOL 100 MG TABLET: 100 | 90 days supply | Qty: 90 | Fill #0

## 2019-02-19 DIAGNOSIS — M545 Low back pain: Secondary | ICD-10-CM | POA: Diagnosis not present

## 2019-03-02 ENCOUNTER — Ambulatory Visit (HOSPITAL_COMMUNITY): Payer: BLUE CROSS/BLUE SHIELD

## 2019-03-07 ENCOUNTER — Other Ambulatory Visit: Payer: Self-pay | Admitting: Physician Assistant

## 2019-03-07 ENCOUNTER — Other Ambulatory Visit: Payer: Self-pay | Admitting: Family Medicine

## 2019-03-07 DIAGNOSIS — G629 Polyneuropathy, unspecified: Secondary | ICD-10-CM

## 2019-03-07 MED FILL — ATORVASTATIN 20 MG TABLET: 20 | 90 days supply | Qty: 90 | Fill #0

## 2019-03-07 MED FILL — FUROSEMIDE 40 MG TAB: 40 | 90 days supply | Qty: 90 | Fill #1

## 2019-03-07 NOTE — Telephone Encounter (Signed)
Requested medication (s) are due for refill today: yes  Requested medication (s) are on the active medication list: yes  Last refill:  12/21/18  Future visit scheduled: yes  Notes to clinic:  Refill not delegated to NT to refill   Requested Prescriptions  Pending Prescriptions Disp Refills   pregabalin (LYRICA) 200 MG capsule [Pharmacy Med Name: PREGABALIN 200 MG CAPS 200 CAP] 180 capsule 0    Sig: Take 1 capsule (200 mg total) by mouth 2 (two) times daily. Do not take gabapentin while taking this medication.     Not Delegated - Neurology:  Anticonvulsants - Controlled Failed - 03/07/2019 10:39 AM      Failed - This refill cannot be delegated      Passed - Valid encounter within last 12 months    Recent Outpatient Visits          2 months ago Peripheral edema   [facility] at [place], MD   3 months ago Essential hypertension   [facility] at [place], MD   5 months ago Flu vaccine need   [facility] Care at [facility], Fenton Malling, MD   10 months ago Well-controlled hypertension   [facility] at [place], PA-C   11 months ago Essential hypertension   [facility], Audrie Lia, PA-C      Future Appointments            In 3 weeks Carlota Raspberry Ranell Patrick, MD [facility] at [facility], [facility]

## 2019-03-08 MED FILL — PANTOPRAZOLE SOD DR 40 MG T: 40 | 30 days supply | Qty: 30 | Fill #0

## 2019-03-10 ENCOUNTER — Other Ambulatory Visit: Payer: Self-pay | Admitting: Family Medicine

## 2019-03-10 ENCOUNTER — Ambulatory Visit: Payer: No Typology Code available for payment source | Admitting: Family Medicine

## 2019-03-12 MED FILL — methIMAzole 5 MG TABS: 5 | 90 days supply | Qty: 90 | Fill #2

## 2019-03-22 MED FILL — methIMAzole 5 MG TABS: 5 | 90 days supply | Qty: 90 | Fill #2

## 2019-03-23 ENCOUNTER — Other Ambulatory Visit: Payer: Self-pay | Admitting: Family Medicine

## 2019-03-23 DIAGNOSIS — G629 Polyneuropathy, unspecified: Secondary | ICD-10-CM

## 2019-03-23 MED FILL — PREGABALIN 200 MG CAPS: 200 | 90 days supply | Qty: 180 | Fill #0

## 2019-03-23 NOTE — Telephone Encounter (Signed)
Patient calling to check the status of this refill request. States that the pharmacy requested this last week. Advised this was the first request we have received. 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 If possible, would like to get this filled today. Please advise.

## 2019-03-30 ENCOUNTER — Ambulatory Visit: Payer: BLUE CROSS/BLUE SHIELD | Admitting: Family Medicine

## 2019-04-04 ENCOUNTER — Other Ambulatory Visit: Payer: Self-pay | Admitting: Family Medicine

## 2019-04-04 MED FILL — CYCLOBENZAPRINE HCL 10 MG T: 10 | 90 days supply | Qty: 90 | Fill #1

## 2019-04-04 MED FILL — PANTOPRAZOLE SOD DR 40 MG T: 40 | 90 days supply | Qty: 90 | Fill #0

## 2019-04-16 MED FILL — ATENOLOL 100 MG TABLET: 100 | 90 days supply | Qty: 90 | Fill #0

## 2019-04-24 DIAGNOSIS — M48062 Spinal stenosis, lumbar region with neurogenic claudication: Secondary | ICD-10-CM | POA: Diagnosis not present

## 2019-05-18 ENCOUNTER — Encounter: Payer: BLUE CROSS/BLUE SHIELD | Admitting: Vascular Surgery

## 2019-05-25 DIAGNOSIS — M48062 Spinal stenosis, lumbar region with neurogenic claudication: Secondary | ICD-10-CM | POA: Diagnosis not present

## 2019-05-30 ENCOUNTER — Telehealth: Payer: Self-pay

## 2019-05-30 NOTE — Telephone Encounter (Signed)
Spoke with patient who states he is wearing compression stockings during the day and is feeling much better since he last saw Dr. Johnsie Cancel. He denies SOB or other cardiac concerns. He is scheduled for follow-up with PCP and vascular surgeon and would like to push this appointment out a few months. I scheduled him for a virtual appointment with Dr. Johnsie Cancel on Friday Oct. 2 and advised him to call back prior to that appointment with questions or concerns. He thanked me for the call.

## 2019-05-30 NOTE — Telephone Encounter (Signed)
I called pt to scheduled his 3 mth f/u with an APP.Marland Kitchenhe was seen in Jan by Dr Johnsie Cancel.. he started him on Lasix and ordered labs. Pt had labs done.  Pt is now going to a Vascular Dr and wants to know if he really needs this f/u in our office. He has a $50 copay and has to take a day off of work and wants to make sure it is necessary.  If not, he would like for [place] to call him back and cancel, he is scheduled to see Cecilie Kicks this Friday 7/24 @ 2:45.  Pt stated that he was feeling fine and thinks he can wait a little longer before coming in.

## 2019-06-01 ENCOUNTER — Ambulatory Visit: Payer: BLUE CROSS/BLUE SHIELD | Admitting: Cardiology

## 2019-06-04 MED FILL — FUROSEMIDE 40 MG TAB: 40 | 90 days supply | Qty: 90 | Fill #2

## 2019-06-04 MED FILL — methIMAzole 5 MG TABS: 5 | 90 days supply | Qty: 90 | Fill #3

## 2019-06-04 MED FILL — ATORVASTATIN 20 MG TABLET: 20 | 90 days supply | Qty: 90 | Fill #1

## 2019-06-07 ENCOUNTER — Telehealth (HOSPITAL_COMMUNITY): Payer: Self-pay | Admitting: Rehabilitation

## 2019-06-07 NOTE — Telephone Encounter (Signed)

## 2019-06-08 ENCOUNTER — Ambulatory Visit: Payer: BLUE CROSS/BLUE SHIELD | Admitting: Vascular Surgery

## 2019-06-08 ENCOUNTER — Encounter: Payer: Self-pay | Admitting: Vascular Surgery

## 2019-06-08 ENCOUNTER — Other Ambulatory Visit: Payer: Self-pay

## 2019-06-08 VITALS — BP 103/69 | HR 74 | Temp 97.5°F | Resp 20 | Ht 74.0 in | Wt 276.0 lb

## 2019-06-08 DIAGNOSIS — I872 Venous insufficiency (chronic) (peripheral): Secondary | ICD-10-CM
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
## 2019-06-15 ENCOUNTER — Other Ambulatory Visit: Payer: Self-pay | Admitting: Family Medicine

## 2019-06-15 DIAGNOSIS — M48062 Spinal stenosis, lumbar region with neurogenic claudication: Secondary | ICD-10-CM | POA: Diagnosis not present

## 2019-06-15 DIAGNOSIS — G629 Polyneuropathy, unspecified: Secondary | ICD-10-CM

## 2019-06-15 MED FILL — MELOXICAM 15 MG TABLET: 15 | 60 days supply | Qty: 60 | Fill #0

## 2019-06-15 NOTE — Telephone Encounter (Signed)
Please advise 

## 2019-06-22 ENCOUNTER — Other Ambulatory Visit: Payer: Self-pay | Admitting: Family Medicine

## 2019-06-22 DIAGNOSIS — G629 Polyneuropathy, unspecified: Secondary | ICD-10-CM

## 2019-06-22 NOTE — Telephone Encounter (Signed)
Pt called in and stated he needs refill today.   Pharmacy - [facility]  Pt stated that he called in med in the 1st of the week

## 2019-06-23 NOTE — Telephone Encounter (Signed)
Patient is requesting a refill of the following medications: Requested Prescriptions   Pending Prescriptions Disp Refills  . pregabalin (LYRICA) 200 MG capsule [Pharmacy Med Name: PREGABALIN 200 MG CAPS 200 CAP] 180 capsule 0    Sig: TAKE 1 CAPSULE BY MOUTH 2 TIMES DAILY. DO NOT TAKE GABAPENTIN WHILE TAKING THIS MEDICATION.    Date of patient request: 06/22/2019 Last office visit: 12/29/2018 Date of last refill:03/23/2019 Last refill amount: 180 Follow up time period per chart: f/u in approx. 3 months  Please advise. Dgaddy, CMA

## 2019-06-24 NOTE — Telephone Encounter (Signed)
Lyrica refilled, but due for follow-up visit.  Please schedule appointment.

## 2019-06-25 ENCOUNTER — Other Ambulatory Visit: Payer: Self-pay | Admitting: Family Medicine

## 2019-06-25 DIAGNOSIS — G629 Polyneuropathy, unspecified: Secondary | ICD-10-CM

## 2019-06-25 MED FILL — PREGABALIN 200 MG CAPS: 200 | 90 days supply | Qty: 180 | Fill #0

## 2019-07-04 ENCOUNTER — Other Ambulatory Visit: Payer: Self-pay | Admitting: Family Medicine

## 2019-07-04 DIAGNOSIS — G6289 Other specified polyneuropathies: Secondary | ICD-10-CM

## 2019-07-04 NOTE — Telephone Encounter (Signed)
Requested medication (s) are due for refill today: yes  Requested medication (s) are on the active medication list yes  Last refill:  12/29/2018  Future visit scheduled: no  Notes to clinic:  This refill cannot be delegated Last office visit was 12/29/2018   Requested Prescriptions  Pending Prescriptions Disp Refills   cyclobenzaprine (FLEXERIL) 10 MG tablet [Pharmacy Med Name: CYCLOBENZAPRINE HCL 10 MG T 10 TAB] 90 tablet 1    Sig: TAKE 1 TABLET BY MOUTH AT BEDTIME FOR NERVE PAIN     Not Delegated - Analgesics:  Muscle Relaxants Failed - 07/04/2019 11:48 AM      Failed - This refill cannot be delegated      Failed - Valid encounter within last 6 months    Recent Outpatient Visits          6 months ago Peripheral edema   [facility], MD   7 months ago Essential hypertension   [facility] [place], MD   9 months ago Flu vaccine need   [facility] at [facility], Fenton Malling, MD   1 year ago Well-controlled hypertension   [facility] at [facility], Audrie Lia, PA-C   1 year ago Essential hypertension   [facility] at [facility], Audrie Lia, PA-C

## 2019-07-05 NOTE — Telephone Encounter (Signed)
Patient checking on the status of cyclobenzaprine (FLEXERIL) 10 MG tablet stating he has 1 pill left, requesting a 90 day supply, informed patient please allow 48 to 72 hour turn around time, please advise  [place], [place] - [place] [phone] (Phone) [phone] (Fax)

## 2019-07-06 MED FILL — CYCLOBENZAPRINE HCL 10 MG T: 10 | 30 days supply | Qty: 30 | Fill #0

## 2019-07-12 NOTE — Telephone Encounter (Signed)
Spoke with patient. He is in the process of finding a new doctor. States he always had trouble with getting his medications on time.

## 2019-07-13 ENCOUNTER — Encounter: Payer: Self-pay | Admitting: Family Medicine

## 2019-07-13 ENCOUNTER — Other Ambulatory Visit: Payer: Self-pay

## 2019-07-13 ENCOUNTER — Telehealth (INDEPENDENT_AMBULATORY_CARE_PROVIDER_SITE_OTHER): Payer: BC Managed Care – PPO | Admitting: Family Medicine

## 2019-07-13 ENCOUNTER — Telehealth: Payer: Self-pay | Admitting: *Deleted

## 2019-07-13 VITALS — Temp 98.3°F | Wt 279.2 lb

## 2019-07-13 DIAGNOSIS — E785 Hyperlipidemia, unspecified: Secondary | ICD-10-CM

## 2019-07-13 DIAGNOSIS — I10 Essential (primary) hypertension: Secondary | ICD-10-CM | POA: Diagnosis not present

## 2019-07-13 DIAGNOSIS — E0500 Thyrotoxicosis with diffuse goiter without thyrotoxic crisis or storm: Secondary | ICD-10-CM | POA: Diagnosis not present

## 2019-07-13 DIAGNOSIS — K219 Gastro-esophageal reflux disease without esophagitis: Secondary | ICD-10-CM

## 2019-07-13 DIAGNOSIS — L709 Acne, unspecified: Secondary | ICD-10-CM

## 2019-07-13 DIAGNOSIS — Z86010 Personal history of colonic polyps: Secondary | ICD-10-CM

## 2019-07-13 DIAGNOSIS — G6289 Other specified polyneuropathies: Secondary | ICD-10-CM | POA: Diagnosis not present

## 2019-07-13 DIAGNOSIS — E1142 Type 2 diabetes mellitus with diabetic polyneuropathy: Secondary | ICD-10-CM

## 2019-07-13 DIAGNOSIS — R7989 Other specified abnormal findings of blood chemistry: Secondary | ICD-10-CM

## 2019-07-13 DIAGNOSIS — E1149 Type 2 diabetes mellitus with other diabetic neurological complication: Secondary | ICD-10-CM | POA: Insufficient documentation

## 2019-07-13 MED ORDER — ATENOLOL 100 MG PO TABS: 100.0000 mg | ORAL_TABLET | Freq: Every day | ORAL | 1 refills | Status: DC

## 2019-07-13 MED ORDER — DOXYCYCLINE HYCLATE 100 MG PO TABS: 100.0000 mg | ORAL_TABLET | Freq: Every day | ORAL | 2 refills | Status: AC

## 2019-07-13 MED ORDER — PANTOPRAZOLE SODIUM 40 MG PO TBEC: 40.0000 mg | DELAYED_RELEASE_TABLET | Freq: Every day | ORAL | 1 refills | Status: DC

## 2019-07-13 MED FILL — ATENOLOL 100 MG TABLET: 100 | 90 days supply | Qty: 90 | Fill #0

## 2019-07-13 MED FILL — PANTOPRAZOLE SOD DR 40 MG T: 40 | 90 days supply | Qty: 90 | Fill #0

## 2019-07-13 MED FILL — DOXYCYCLINE HYCLATE 100 MG: 100 | 30 days supply | Qty: 30 | Fill #0

## 2019-07-13 NOTE — Telephone Encounter (Signed)
Appts scheduled as below.  

## 2019-07-13 NOTE — Progress Notes (Signed)
Virtual Visit via Video Note  I connected with Jeffrey Price   on 07/13/19 at  9:30 AM EDT by a video enabled telemedicine application and verified that I am speaking with the correct person using two identifiers.  Location patient: home Location provider:work office Persons participating in the virtual visit: patient, provider  I discussed the limitations of evaluation and management by telemedicine and the availability of in person appointments. The patient expressed understanding and agreed to proceed.    Jeffrey Price DOB: 18-Sep-1956 Encounter date: 07/13/2019  This is a 63 y.o. male who presents to establish care. Chief Complaint  Patient presents with  . Establish Care    History of present illness: HTN: atenolol; follows with Dr Johnsie Cancel. 126/89 today.   Hx of bilat LE DVT: saw vasc surg 06/08/19: lasix helps with swelling, compression stockings. Follow up in 3 mo to discuss greater saphenous vein ablation.  HL: lipitor 20mg   Graves disease: methimazole 5mg  daily; follows with Dr. Cruzita Lederer (but no visit in last couple years? Patient states she was just getting [place] on primary orders when done).   Peripheral neuropathy: lyrica 200mg  BID.  Pain from back.   Hx of adenomatous colon polyps; due for repeat colonoscopy 12/2020 (Dr. Carlean Purl).  Chronic back pain: recently referred to spine surgery. Flexeril prn. Getting another spinal injection next week. Will then have 3rd. If not better after this will consider surgery. Pain due to old driving issue. Flexeril just at bedtime. Robaxin is limited to just as needed. Meloxicam just as needed.   DMII: A1C 2/28 was 6.6. Not on medication.   GERD: noted on endoscopy; not having reflux symptoms. Takes protonix daily.   Was taking doxy for oily skin; acne. It seemed to help with oil. Has not taken this in 6 months. Mainly around nose. Having to wash face multiple times daily to control oil.   Low testosterone - was on replacement but then  started to worry about side effects with medication so stopped.   Sees Dr. Truman Hayward for eye check annually.   Past Medical History:  Diagnosis Date  . Arthritis    in back  . Blood clot in vein 1995   bil legs  . Bulging lumbar disc   . Cataract    mild  . Cellulitis 2012   forehead  . DDD (degenerative disc disease), lumbar   . Graves disease   . Hx of adenomatous colonic polyps 12/15/2017  . Hyperlipidemia   . Hypertension   . Thyrotoxicosis without thyroid storm    Past Surgical History:  Procedure Laterality Date  . LUMBAR EPIDURAL INJECTION     No Known Allergies Current Meds  Medication Sig  . acetaminophen (TYLENOL) 325 MG tablet Take 650 mg by mouth every 6 (six) hours as needed.  Marland Kitchen aspirin 81 MG chewable tablet Chew 81 mg by mouth daily.  Marland Kitchen atenolol (TENORMIN) 100 MG tablet Take 1 tablet (100 mg total) by mouth daily.  Marland Kitchen atorvastatin (LIPITOR) 20 MG tablet Take 1 tablet (20 mg total) by mouth daily.  . cyclobenzaprine (FLEXERIL) 10 MG tablet TAKE 1 TABLET BY MOUTH AT BEDTIME FOR NERVE PAIN  . furosemide (LASIX) 40 MG tablet Take 1 tablet (40 mg total) by mouth daily.  . meloxicam (MOBIC) 15 MG tablet Take 15 mg by mouth daily.   . methimazole (TAPAZOLE) 5 MG tablet Take 5 mg in a.m.  Marland Kitchen methocarbamol (ROBAXIN) 500 MG tablet Take 500 mg by mouth as needed.   Marland Kitchen  pantoprazole (PROTONIX) 40 MG tablet Take 1 tablet (40 mg total) by mouth daily.  . pregabalin (LYRICA) 200 MG capsule TAKE 1 CAPSULE BY MOUTH 2 TIMES DAILY. DO NOT TAKE GABAPENTIN WHILE TAKING THIS MEDICATION.  Marland Kitchen triamcinolone cream (KENALOG) 0.1 % Apply 1 application topically 2 (two) times daily.  . [DISCONTINUED] atenolol (TENORMIN) 100 MG tablet TAKE 1 TABLET (100 MG TOTAL) BY MOUTH DAILY.  . [DISCONTINUED] pantoprazole (PROTONIX) 40 MG tablet TAKE 1 TABLET BY MOUTH ONCE DAILY   Social History   Tobacco Use  . Smoking status: Former Smoker    Quit date: 12/03/2011    Years since quitting: 7.6  . Smokeless  tobacco: Never Used  . Tobacco comment: occasional vaping  Substance Use Topics  . Alcohol use: Yes    Alcohol/week: 8.0 - 10.0 standard drinks    Types: 8 - 10 Standard drinks or equivalent per week    Comment: , whiskey, Burbon, 30 drinks in a months time   Family History  Problem Relation Age of Onset  . Thyroid disease Mother   . Parkinson's disease Father   . Colon cancer Neg Hx   . Rectal cancer Neg Hx   . Stomach cancer Neg Hx   . Esophageal cancer Neg Hx      Review of Systems  Constitutional: Negative for chills, fatigue and fever.  Respiratory: Negative for cough, chest tightness, shortness of breath and wheezing.   Cardiovascular: Negative for chest pain, palpitations and leg swelling.  Psychiatric/Behavioral: Negative for sleep disturbance. The patient is not nervous/anxious.     Objective:  Temp 98.3 F (36.8 C) Comment: taken by pt-jaf  Wt 279 lb 3.2 oz (126.6 kg)   BMI 35.85 kg/m   Weight: 279 lb 3.2 oz (126.6 kg)   BP Readings from Last 3 Encounters:  06/08/19 103/69  12/29/18 116/73  12/04/18 124/78   Wt Readings from Last 3 Encounters:  07/13/19 279 lb 3.2 oz (126.6 kg)  06/08/19 276 lb (125.2 kg)  12/29/18 285 lb 3.2 oz (129.4 kg)    Physical Exam Constitutional:      Appearance: Normal appearance.  HENT:     Head: Normocephalic and atraumatic.  Pulmonary:     Effort: Pulmonary effort is normal.  Neurological:     Mental Status: He is alert.  Psychiatric:        Mood and Affect: Mood normal.        Behavior: Behavior normal.     Assessment/Plan:  1. Controlled type 2 diabetes mellitus with diabetic polyneuropathy, without long-term current use of insulin (HCC) Diet and exercise controlled.  Last A1c was up and 6.6.  Will recheck blood work and schedule follow-up pending these results. - Hemoglobin A1c; Future - Microalbumin / creatinine urine ratio; Future  2. Graves disease Follows with Dr. Cruzita Lederer.  Has been well controlled.   Continue current medication. - Thyroid Panel With TSH; Future  3. Essential hypertension Has been well controlled.  Continue current medication. - CBC with Differential/Platelet; Future - Comprehensive metabolic panel; Future - atenolol (TENORMIN) 100 MG tablet; Take 1 tablet (100 mg total) by mouth daily.  Dispense: 90 tablet; Refill: 1  4. Other polyneuropathy Get some relief with Lyrica.  5. Hx of adenomatous colonic polyps Needs repeat colonoscopy in 2022.  6. Hyperlipidemia, unspecified hyperlipidemia type Continue with Lipitor 20 mg.  Will recheck blood work. - Lipid panel; Future  7. Low testosterone in male We will recheck testosterone levels.  In the past was  on replacement, but worried about side effect.  Has some interest in restarting pending blood work results. - Testosterone; Future - PSA; Future  8. Acne, unspecified acne type Has done well with doxycycline.  We did discuss considering topical treatment rather than continuing to take oral antibiotics.  However, we will do doxycycline until he can have a follow-up in person visit and I can better assess his skin. - doxycycline (VIBRA-TABS) 100 MG tablet; Take 1 tablet (100 mg total) by mouth daily.  Dispense: 30 tablet; Refill: 2  9. Gastroesophageal reflux disease, esophagitis presence not specified - pantoprazole (PROTONIX) 40 MG tablet; Take 1 tablet (40 mg total) by mouth daily.  Dispense: 90 tablet; Refill: 1    Return for bloodwork then [place] in 1-2 mo.  Micheline Rough, MD   I discussed the assessment and treatment plan with the patient. The patient was provided an opportunity to ask questions and all were answered. The patient agreed with the plan and demonstrated an understanding of the instructions.   The patient was advised to call back or seek an in-person evaluation if the symptoms worsen or if the condition fails to improve as anticipated.  I provided 33 minutes of non-face-to-face time during this  encounter.

## 2019-07-13 NOTE — Telephone Encounter (Signed)
-----   Message from Caren Macadam, MD sent at 07/13/2019  9:47 AM EDT ----- Please schedule bloodwork asap and then visit 1-2 months.

## 2019-07-19 DIAGNOSIS — M48062 Spinal stenosis, lumbar region with neurogenic claudication: Secondary | ICD-10-CM | POA: Diagnosis not present

## 2019-07-20 ENCOUNTER — Other Ambulatory Visit: Payer: Self-pay

## 2019-07-20 ENCOUNTER — Other Ambulatory Visit (INDEPENDENT_AMBULATORY_CARE_PROVIDER_SITE_OTHER): Payer: BC Managed Care – PPO

## 2019-07-20 ENCOUNTER — Telehealth: Payer: Self-pay | Admitting: *Deleted

## 2019-07-20 DIAGNOSIS — R7989 Other specified abnormal findings of blood chemistry: Secondary | ICD-10-CM | POA: Diagnosis not present

## 2019-07-20 DIAGNOSIS — I10 Essential (primary) hypertension: Secondary | ICD-10-CM

## 2019-07-20 DIAGNOSIS — E0500 Thyrotoxicosis with diffuse goiter without thyrotoxic crisis or storm: Secondary | ICD-10-CM

## 2019-07-20 DIAGNOSIS — R946 Abnormal results of thyroid function studies: Secondary | ICD-10-CM | POA: Diagnosis not present

## 2019-07-20 DIAGNOSIS — E1142 Type 2 diabetes mellitus with diabetic polyneuropathy: Secondary | ICD-10-CM

## 2019-07-20 DIAGNOSIS — E785 Hyperlipidemia, unspecified: Secondary | ICD-10-CM | POA: Diagnosis not present

## 2019-07-20 LAB — COMPREHENSIVE METABOLIC PANEL
ALT: 22 U/L (ref 0–53)
AST: 17 U/L (ref 0–37)
Albumin: 4.3 g/dL (ref 3.5–5.2)
Alkaline Phosphatase: 109 U/L (ref 39–117)
BUN: 17 mg/dL (ref 6–23)
CO2: 27 mEq/L (ref 19–32)
Calcium: 9.7 mg/dL (ref 8.4–10.5)
Chloride: 101 mEq/L (ref 96–112)
Creatinine, Ser: 1 mg/dL (ref 0.40–1.50)
GFR: 75.55 mL/min (ref >60.00)
Glucose, Bld: 127 mg/dL — ABNORMAL HIGH (ref 70–99)
Potassium: 4.5 mEq/L (ref 3.5–5.1)
Sodium: 138 mEq/L (ref 135–145)
Total Bilirubin: 0.6 mg/dL (ref 0.2–1.2)
Total Protein: 7.1 g/dL (ref 6.0–8.3)

## 2019-07-20 LAB — CBC WITH DIFFERENTIAL/PLATELET
Basophils Absolute: 0 10*3/uL (ref 0.0–0.1)
Basophils Relative: 0.1 % (ref 0.0–3.0)
Eosinophils Absolute: 0 10*3/uL (ref 0.0–0.7)
Eosinophils Relative: 0 % (ref 0.0–5.0)
HCT: 42.1 % (ref 39.0–52.0)
Hemoglobin: 13.8 g/dL (ref 13.0–17.0)
Lymphocytes Relative: 10.7 % — ABNORMAL LOW (ref 12.0–46.0)
Lymphs Abs: 2 10*3/uL (ref 0.7–4.0)
MCHC: 32.8 g/dL (ref 30.0–36.0)
MCV: 87.3 fl (ref 78.0–100.0)
Monocytes Absolute: 0.7 10*3/uL (ref 0.1–1.0)
Monocytes Relative: 3.7 % (ref 3.0–12.0)
Neutro Abs: 15.9 10*3/uL — ABNORMAL HIGH (ref 1.4–7.7)
Neutrophils Relative %: 85.5 % — ABNORMAL HIGH (ref 43.0–77.0)
Platelets: 255 10*3/uL (ref 150.0–400.0)
RBC: 4.82 Mil/uL (ref 4.22–5.81)
RDW: 14.2 % (ref 11.5–15.5)
WBC: 18.6 10*3/uL (ref 4.0–10.5)

## 2019-07-20 LAB — LIPID PANEL
Cholesterol: 142 mg/dL (ref 0–200)
HDL: 40.8 mg/dL (ref >39.00)
LDL Cholesterol: 77 mg/dL (ref 0–99)
NonHDL: 100.88
Total CHOL/HDL Ratio: 3
Triglycerides: 117 mg/dL (ref 0.0–149.0)
VLDL: 23.4 mg/dL (ref 0.0–40.0)

## 2019-07-20 LAB — PSA: PSA: 0.32 ng/mL (ref 0.10–4.00)

## 2019-07-20 LAB — MICROALBUMIN / CREATININE URINE RATIO
Creatinine,U: 104.5 mg/dL
Microalb Creat Ratio: 2.4 mg/g (ref 0.0–30.0)
Microalb, Ur: 2.5 mg/dL — ABNORMAL HIGH (ref 0.0–1.9)

## 2019-07-20 LAB — HEMOGLOBIN A1C: Hgb A1c MFr Bld: 6.7 % — ABNORMAL HIGH (ref 4.6–6.5)

## 2019-07-20 LAB — TESTOSTERONE: Testosterone: 66.27 ng/dL — ABNORMAL LOW (ref 300.00–890.00)

## 2019-07-20 NOTE — Telephone Encounter (Signed)
Left a message for the pt to return a call to the office.  CRM also created. 

## 2019-07-20 NOTE — Telephone Encounter (Signed)
Dr Ethlyn Gallery reviewed the copy of a note from [place] stating the critical lab results for a white count of 18.6.  Per Dr Ethlyn Gallery I left a message for the pt to return my call as she stated she wanted to know how the patient is feeling and when was the last steroid injection as this could be related.

## 2019-07-21 LAB — THYROID PANEL WITH TSH
Free Thyroxine Index: 1.6 (ref 1.4–3.8)
T3 Uptake: 30 % (ref 22–35)
T4, Total: 5.4 ug/dL (ref 4.9–10.5)
TSH: 0.66 mIU/L (ref 0.40–4.50)

## 2019-07-26 ENCOUNTER — Encounter: Payer: Self-pay | Admitting: Family Medicine

## 2019-07-27 ENCOUNTER — Encounter: Payer: Self-pay | Admitting: Family Medicine

## 2019-07-27 ENCOUNTER — Other Ambulatory Visit: Payer: Self-pay

## 2019-07-27 ENCOUNTER — Other Ambulatory Visit (INDEPENDENT_AMBULATORY_CARE_PROVIDER_SITE_OTHER): Payer: BC Managed Care – PPO

## 2019-07-27 DIAGNOSIS — D72829 Elevated white blood cell count, unspecified: Secondary | ICD-10-CM

## 2019-07-27 DIAGNOSIS — Z23 Encounter for immunization: Secondary | ICD-10-CM | POA: Diagnosis not present

## 2019-07-27 LAB — CBC WITH DIFFERENTIAL/PLATELET
Basophils Absolute: 0.1 10*3/uL (ref 0.0–0.1)
Basophils Relative: 0.5 % (ref 0.0–3.0)
Eosinophils Absolute: 0.1 10*3/uL (ref 0.0–0.7)
Eosinophils Relative: 1.1 % (ref 0.0–5.0)
HCT: 43.2 % (ref 39.0–52.0)
Hemoglobin: 14.3 g/dL (ref 13.0–17.0)
Lymphocytes Relative: 31.3 % (ref 12.0–46.0)
Lymphs Abs: 3.2 10*3/uL (ref 0.7–4.0)
MCHC: 33.1 g/dL (ref 30.0–36.0)
MCV: 87.3 fl (ref 78.0–100.0)
Monocytes Absolute: 1.1 10*3/uL — ABNORMAL HIGH (ref 0.1–1.0)
Monocytes Relative: 10.5 % (ref 3.0–12.0)
Neutro Abs: 5.8 10*3/uL (ref 1.4–7.7)
Neutrophils Relative %: 56.6 % (ref 43.0–77.0)
Platelets: 255 10*3/uL (ref 150.0–400.0)
RBC: 4.95 Mil/uL (ref 4.22–5.81)
RDW: 14.3 % (ref 11.5–15.5)
WBC: 10.2 10*3/uL (ref 4.0–10.5)

## 2019-07-27 NOTE — Telephone Encounter (Signed)
Please call patient:   We need to have a normal repeat CBC before we can start testosterone.   I will order. Please schedule this for next week. If normalizes, I can send in testosterone to start prior to October appointment.

## 2019-07-27 NOTE — Telephone Encounter (Signed)
Called the pt and informed him of the message below.  Dr Ethlyn Gallery approved labs for today and an appt was scheduled for today at 2pm.

## 2019-07-31 ENCOUNTER — Other Ambulatory Visit: Payer: Self-pay | Admitting: Family Medicine

## 2019-07-31 DIAGNOSIS — G6289 Other specified polyneuropathies: Secondary | ICD-10-CM

## 2019-08-01 ENCOUNTER — Other Ambulatory Visit: Payer: Self-pay | Admitting: Family Medicine

## 2019-08-01 ENCOUNTER — Telehealth: Payer: Self-pay | Admitting: *Deleted

## 2019-08-01 DIAGNOSIS — R7989 Other specified abnormal findings of blood chemistry: Secondary | ICD-10-CM

## 2019-08-01 MED ORDER — TESTOSTERONE CYPIONATE 200 MG/ML IM SOLN: 200.0000 mg | INTRAMUSCULAR | 0 refills | Status: DC

## 2019-08-01 NOTE — Telephone Encounter (Signed)
Copied from [place] [id number]. Topic: General - Inquiry >> Aug 01, 2019  2:41 PM Jeffrey Price wrote: Reason for CRM: pt called in and would like to talk to [facility] about the testosterone that he thought was going to be called in ?   [facility] - [facility]   Best number  [phone]

## 2019-08-01 NOTE — Telephone Encounter (Signed)
Message forwarded to Dr Ethlyn Gallery.

## 2019-08-01 NOTE — Telephone Encounter (Signed)
I sent this evening; also sent message back to you regarding a follow up question.

## 2019-08-02 ENCOUNTER — Telehealth: Payer: Self-pay | Admitting: *Deleted

## 2019-08-02 MED ORDER — "BD ECLIPSE NEEDLE 22G X 1-1/2"" MISC": 0 refills | Status: DC

## 2019-08-02 MED ORDER — SYRINGE (DISPOSABLE) 3 ML MISC: 0 refills | Status: DC

## 2019-08-02 MED ORDER — CYCLOBENZAPRINE HCL 10 MG PO TABS: ORAL_TABLET | ORAL | 0 refills | Status: DC

## 2019-08-02 MED FILL — BD NEEDLES 22GX1.5": 22G X 1-1/2 | 28 days supply | Qty: 4 | Fill #0

## 2019-08-02 MED FILL — TESTOSTERONE CYP 200 MG/ML: 200 | 28 days supply | Qty: 2 | Fill #0

## 2019-08-02 MED FILL — CYCLOBENZAPRINE HCL 10 MG T: 10 | 30 days supply | Qty: 30 | Fill #0

## 2019-08-02 MED FILL — BD NEEDLES 22GX1.5: 22G X 1-1/2 | 28 days supply | Qty: 4 | Fill #0

## 2019-08-02 NOTE — Addendum Note (Signed)
Addended by: Agnes Lawrence on: 08/02/2019 08:35 AM   Modules accepted: Orders

## 2019-08-02 NOTE — Telephone Encounter (Signed)
Jeffrey Price pharmacy faxed a refill request for Cyclobenzaprine 10mg -#30-take 1 tablet by mouth at bedtime for nerve pain.  Last rx given by Dr Nyoka Cowden on 8/28.

## 2019-08-02 NOTE — Telephone Encounter (Signed)
Rx done. 

## 2019-08-02 NOTE — Telephone Encounter (Signed)
ok 

## 2019-08-02 NOTE — Telephone Encounter (Signed)
Patient informed-see results note. 

## 2019-08-06 ENCOUNTER — Telehealth: Payer: Self-pay

## 2019-08-06 NOTE — Telephone Encounter (Signed)
Left message for patient to call back. Calling to switch patient from virtual visit to in office visit with Dr. Johnsie Cancel on Friday.

## 2019-08-08 NOTE — Telephone Encounter (Signed)
Spoke with pt who states he wants to stay virtual.      Virtual Visit Pre-Appointment Phone Call  "(Name), I am calling you today to discuss your upcoming appointment. We are currently trying to limit exposure to the virus that causes COVID-19 by seeing patients at home rather than in the office."  1. "What is the BEST phone number to call the day of the visit?" - include this in appointment notes  2. "Do you have or have access to (through a family member/friend) a smartphone with video capability that we can use for your visit?" a. If yes - list this number in appt notes as "cell" (if different from BEST phone #) and list the appointment type as a VIDEO visit in appointment notes b. If no - list the appointment type as a PHONE visit in appointment notes  3. Confirm consent - "In the setting of the current Covid19 crisis, you are scheduled for a (phone or video) visit with your provider on (date) at (time).  Just as we do with many in-office visits, in order for you to participate in this visit, we must obtain consent.  If you'd like, I can send this to your mychart (if signed up) or email for you to review.  Otherwise, I can obtain your verbal consent now.  All virtual visits are billed to your insurance company just like a normal visit would be.  By agreeing to a virtual visit, we'd like you to understand that the technology does not allow for your provider to perform an examination, and thus may limit your provider's ability to fully assess your condition. If your provider identifies any concerns that need to be evaluated in person, we will make arrangements to do so.  Finally, though the technology is pretty good, we cannot assure that it will always work on either your or our end, and in the setting of a video visit, we may have to convert it to a phone-only visit.  In either situation, we cannot ensure that we have a secure connection.  Are you willing to proceed?" STAFF: Did the patient  verbally acknowledge consent to telehealth visit? Document YES/NO here: YES  4. Advise patient to be prepared - "Two hours prior to your appointment, go ahead and check your blood pressure, pulse, oxygen saturation, and your weight (if you have the equipment to check those) and write them all down. When your visit starts, your provider will ask you for this information. If you have an Apple Watch or Kardia device, please plan to have heart rate information ready on the day of your appointment. Please have a pen and paper handy nearby the day of the visit as well."  5. Give patient instructions for MyChart download to smartphone OR Doximity/Doxy.me as below if video visit (depending on what platform provider is using)  6. Inform patient they will receive a phone call 15 minutes prior to their appointment time (may be from unknown caller ID) so they should be prepared to answer    Jeffrey Price has been deemed a candidate for a follow-up tele-health visit to limit community exposure during the Covid-19 pandemic. I spoke with the patient via phone to ensure availability of phone/video source, confirm preferred email & phone number, and discuss instructions and expectations.  I reminded Jeffrey Price to be prepared with any vital sign and/or heart rhythm information that could potentially be obtained via home monitoring, at the time of his  visit. I reminded Jeffrey Price to expect a phone call prior to his visit.  Jacinta Shoe, CMA 08/08/2019 10:26 AM   INSTRUCTIONS FOR DOWNLOADING THE MYCHART APP TO SMARTPHONE  - The patient must first make sure to have activated MyChart and know their login information - If Apple, go to [employer] and type in MyChart in the search bar and download the app. If Android, ask patient to go to [employer] and type in [place] in the search bar and download the app. The app is free but as with any other app downloads, their phone may  require them to verify saved payment information or Apple/Android password.  - The patient will need to then log into the app with their MyChart username and password, and select [facility] as their healthcare provider to link the account. When it is time for your visit, go to the MyChart app, find appointments, and click Begin Video Visit. Be sure to Select Allow for your device to access the Microphone and Camera for your visit. You will then be connected, and your provider will be with you shortly.  **If they have any issues connecting, or need assistance please contact MyChart service desk [phone])**  **If using a computer, in order to ensure the best quality for their visit they will need to use either of the following Internet Browsers: [employer], or Google Chrome**  IF USING DOXIMITY or DOXY.ME - The patient will receive a link just prior to their visit by text.     FULL LENGTH CONSENT FOR TELE-HEALTH VISIT   I hereby voluntarily request, consent and authorize [facility] and its employed or contracted physicians, physician assistants, nurse practitioners or other licensed health care professionals (the Practitioner), to provide me with telemedicine health care services (the "Services") as deemed necessary by the treating Practitioner. I acknowledge and consent to receive the Services by the Practitioner via telemedicine. I understand that the telemedicine visit will involve communicating with the Practitioner through live audiovisual communication technology and the disclosure of certain medical information by electronic transmission. I acknowledge that I have been given the opportunity to request an in-person assessment or other available alternative prior to the telemedicine visit and am voluntarily participating in the telemedicine visit.  I understand that I have the right to withhold or withdraw my consent to the use of telemedicine in the course of my care at  any time, without affecting my right to future care or treatment, and that the Practitioner or I may terminate the telemedicine visit at any time. I understand that I have the right to inspect all information obtained and/or recorded in the course of the telemedicine visit and may receive copies of available information for a reasonable fee.  I understand that some of the potential risks of receiving the Services via telemedicine include:  Marland Kitchen Delay or interruption in medical evaluation due to technological equipment failure or disruption; . Information transmitted may not be sufficient (e.g. poor resolution of images) to allow for appropriate medical decision making by the Practitioner; and/or  . In rare instances, security protocols could fail, causing a breach of personal health information.  Furthermore, I acknowledge that it is my responsibility to provide information about my medical history, conditions and care that is complete and accurate to the best of my ability. I acknowledge that Practitioner's advice, recommendations, and/or decision may be based on factors not within their control, such as incomplete or inaccurate data provided by me  or distortions of diagnostic images or specimens that may result from electronic transmissions. I understand that the practice of medicine is not an exact science and that Practitioner makes no warranties or guarantees regarding treatment outcomes. I acknowledge that I will receive a copy of this consent concurrently upon execution via email to the email address I last provided but may also request a printed copy by calling the office of [facility].    I understand that my insurance will be billed for this visit.   I have read or had this consent read to me. . I understand the contents of this consent, which adequately explains the benefits and risks of the Services being provided via telemedicine.  . I have been provided ample opportunity to ask questions  regarding this consent and the Services and have had my questions answered to my satisfaction. . I give my informed consent for the services to be provided through the use of telemedicine in my medical care  By participating in this telemedicine visit I agree to the above.

## 2019-08-09 NOTE — Progress Notes (Signed)
Cardiology Office Note   Date:  08/10/2019   ID:  Jeffrey Price, DOB 24-Jan-1956, MRN [id number]  PCP:  Caren Macadam, MD  Cardiologist:   Jenkins Rouge, MD   No chief complaint on file.  Virtual Visit via Telephone Note   This visit type was conducted due to national recommendations for restrictions regarding the COVID-19 Pandemic (e.g. social distancing) in an effort to limit this patient's exposure and mitigate transmission in our community.  Due to her co-morbid illnesses, this patient is at least at moderate risk for complications without adequate follow up.  This format is felt to be most appropriate for this patient at this time.  The patient did not have access to video technology/had technical difficulties with video requiring transitioning to audio format only (telephone).  All issues noted in this document were discussed and addressed.  No physical exam could be performed with this format.  Please refer to the patient's chart for her  consent to telehealth for [facility].      History of Present Illness: Jeffrey Price is a 63 y.o. male first seen 12/04/18  regarding edema and HTN.  Referred by Dr Carlota Raspberry.  Long standing HTN. Been on diuretic and beta blockers. He has thyroid disease and HLD as well Activity Limited by back pain. And arthritis  Uses lyrica for neuropathy   Labs fine Cr .90  LDL 49 TSH 2.5   Drives a truck.  Has bad neuropathy and back pain that limits activity Has gained 50 lbs since being Rx for hyperthyroidism Previous history of DVT in both LE;s   Widowed 4 years ago wife had lung cancer. Has long time girlfriend One daughter and some step children His step son is Carlean Purl who played ball with my son Olevia Perches  Echo reviewed from 12/11/18 EF normal no pulmonary HTN AV sclerosis  Duplex with no active DVT and significant venous reflux on both legs   Recent labs with A1c 6.7 Testosterone low and starting shots LDL 77 Normal thyroid and kidneys  with K 4.5   Doing well no active cardiac issues but lot';s of medical issues    Past Medical History:  Diagnosis Date  . Arthritis    in back  . Blood clot in vein 1995   bil legs  . Bulging lumbar disc   . Cataract    mild  . Cellulitis 2012   forehead  . DDD (degenerative disc disease), lumbar   . Graves disease   . Hx of adenomatous colonic polyps 12/15/2017  . Hyperlipidemia   . Hypertension   . Thyrotoxicosis without thyroid storm     Past Surgical History:  Procedure Laterality Date  . LUMBAR EPIDURAL INJECTION       Current Outpatient Medications  Medication Sig Dispense Refill  . acetaminophen (TYLENOL) 325 MG tablet Take 650 mg by mouth every 6 (six) hours as needed.    Marland Kitchen aspirin 81 MG chewable tablet Chew 81 mg by mouth daily.    Marland Kitchen atenolol (TENORMIN) 100 MG tablet Take 1 tablet (100 mg total) by mouth daily. 90 tablet 1  . atorvastatin (LIPITOR) 20 MG tablet Take 1 tablet (20 mg total) by mouth daily. 90 tablet 1  . cyclobenzaprine (FLEXERIL) 10 MG tablet Take 1 tablet by mouth at bedtime for nerve pain 30 tablet 0  . doxycycline (VIBRA-TABS) 100 MG tablet Take 1 tablet (100 mg total) by mouth daily. 30 tablet 2  . furosemide (LASIX) 40  MG tablet Take 1 tablet (40 mg total) by mouth daily. 90 tablet 3  . meloxicam (MOBIC) 15 MG tablet Take 15 mg by mouth daily.     . methimazole (TAPAZOLE) 5 MG tablet Take 5 mg in a.m. 90 tablet 5  . methocarbamol (ROBAXIN) 500 MG tablet Take 500 mg by mouth as needed.     Marland Kitchen NEEDLE, DISP, 22 G (BD ECLIPSE NEEDLE) 22G X 1-1/2" MISC Use as directed 100 each 0  . pantoprazole (PROTONIX) 40 MG tablet Take 1 tablet (40 mg total) by mouth daily. 90 tablet 1  . pregabalin (LYRICA) 200 MG capsule TAKE 1 CAPSULE BY MOUTH 2 TIMES DAILY. DO NOT TAKE GABAPENTIN WHILE TAKING THIS MEDICATION. 180 capsule 0  . Syringe, Disposable, 3 ML MISC Use as directed 100 each 0  . testosterone cypionate (DEPO-TESTOSTERONE) 200 MG/ML injection Inject 1  mL (200 mg total) into the muscle every 14 (fourteen) days. 6 mL 0  . triamcinolone cream (KENALOG) 0.1 % Apply 1 application topically 2 (two) times daily. 60 g 2   No current facility-administered medications for this visit.     Allergies:   Patient has no known allergies.    Social History:  The patient  reports that he quit smoking about 7 years ago. He has never used smokeless tobacco. He reports current alcohol use of about 8.0 - 10.0 standard drinks of alcohol per week. He reports that he does not use drugs.   Family History:  The patient's family history includes Parkinson's disease in his father; Thyroid disease in his mother.    ROS:  Please see the history of present illness.   Otherwise, review of systems are positive for none.   All other systems are reviewed and negative.    PHYSICAL EXAM: VS:  BP 123/85   Pulse 63   Ht 6\' 2"  (1.88 m)   Wt 279 lb 3.2 oz (126.6 kg)   BMI 35.85 kg/m  , BMI Body mass index is 35.85 kg/m. Affect appropriate Healthy:  appears stated age 48: normal Neck supple with no adenopathy JVP normal no bruits no thyromegaly Lungs clear with no wheezing and good diaphragmatic motion Heart:  S1/S2 no murmur, no rub, gallop or click PMI normal Abdomen: benighn, BS positve, no tenderness, no AAA no bruit.  No HSM or HJR Distal pulses intact with no bruits Bilateral varicosities and edema Neuro non-focal Skin warm and dry No muscular weakness    EKG:  NSR rate 55 non specific ST T wave changes    Recent Labs: 12/29/2018: NT-Pro BNP 76 07/20/2019: ALT 22; BUN 17; Creatinine, Ser 1.00; Potassium 4.5; Sodium 138; TSH 0.66 07/27/2019: Hemoglobin 14.3; Platelets 255.0    Lipid Panel    Component Value Date/Time   CHOL 142 07/20/2019 1039   CHOL 151 11/29/2018 1715   TRIG 117.0 07/20/2019 1039   HDL 40.80 07/20/2019 1039   HDL 33 (L) 11/29/2018 1715   CHOLHDL 3 07/20/2019 1039   VLDL 23.4 07/20/2019 1039   LDLCALC 77 07/20/2019 1039    LDLCALC 49 11/29/2018 1715      Wt Readings from Last 3 Encounters:  08/10/19 279 lb 3.2 oz (126.6 kg)  07/13/19 279 lb 3.2 oz (126.6 kg)  06/08/19 276 lb (125.2 kg)      Other studies Reviewed: Additional studies/ records that were reviewed today include: notes from primary labs .    ASSESSMENT AND PLAN:  1.  HTN:  Well controlled.  Continue current medications  and low sodium Dash type diet.   2. Edema:  This is from obesity and varicosities with history of LE DVT' s Placed back on lasix in January Seen by Dr Donzetta Matters In July and discussing saphenous vein ablation hopefully in November  3. HLD:  Continue statin labs with primary 4. Thyroid:  Normal TSH continue Tapazole f/u endocrine  5 Neuropathy:  Continue lyrica needs f/u neuro as it is not very effective 6. Low T:  Started back on shots helped in past   7. Graves:  TSH normal but still on Tapazole continue atenolol f/u Dr Renne Crigler   Current medicines are reviewed at length with the patient today.  The patient does not have concerns regarding medicines.   The following changes have been made: None   Labs/ tests ordered today include: None   No orders of the defined types were placed in this encounter.    Disposition:   FU with cardiology in a year     Signed, Jenkins Rouge, MD  08/10/2019 11:00 AM    [address], [place], [place]  [address] Phone: [phone]; Fax: [phone]

## 2019-08-10 ENCOUNTER — Telehealth (INDEPENDENT_AMBULATORY_CARE_PROVIDER_SITE_OTHER): Payer: BC Managed Care – PPO | Admitting: Cardiovascular Disease

## 2019-08-10 ENCOUNTER — Encounter: Payer: Self-pay | Admitting: Cardiovascular Disease

## 2019-08-10 ENCOUNTER — Other Ambulatory Visit: Payer: Self-pay

## 2019-08-10 VITALS — BP 123/85 | HR 63 | Ht 74.0 in | Wt 279.2 lb

## 2019-08-10 DIAGNOSIS — R609 Edema, unspecified: Secondary | ICD-10-CM | POA: Diagnosis not present

## 2019-08-10 MED FILL — DOXYCYCLINE HYCLATE 100 MG: 100 | 30 days supply | Qty: 30 | Fill #1

## 2019-08-10 NOTE — Patient Instructions (Signed)

## 2019-08-24 ENCOUNTER — Encounter: Payer: Self-pay | Admitting: Family Medicine

## 2019-08-24 ENCOUNTER — Other Ambulatory Visit: Payer: Self-pay | Admitting: Family Medicine

## 2019-08-24 ENCOUNTER — Other Ambulatory Visit: Payer: Self-pay

## 2019-08-24 ENCOUNTER — Ambulatory Visit: Payer: BC Managed Care – PPO | Admitting: Family Medicine

## 2019-08-24 VITALS — BP 110/60 | HR 73 | Temp 97.7°F | Wt 282.0 lb

## 2019-08-24 DIAGNOSIS — R7989 Other specified abnormal findings of blood chemistry: Secondary | ICD-10-CM | POA: Diagnosis not present

## 2019-08-24 DIAGNOSIS — I10 Essential (primary) hypertension: Secondary | ICD-10-CM | POA: Diagnosis not present

## 2019-08-24 DIAGNOSIS — N529 Male erectile dysfunction, unspecified: Secondary | ICD-10-CM

## 2019-08-24 DIAGNOSIS — G6289 Other specified polyneuropathies: Secondary | ICD-10-CM

## 2019-08-24 DIAGNOSIS — E1142 Type 2 diabetes mellitus with diabetic polyneuropathy: Secondary | ICD-10-CM

## 2019-08-24 DIAGNOSIS — E0500 Thyrotoxicosis with diffuse goiter without thyrotoxic crisis or storm: Secondary | ICD-10-CM

## 2019-08-24 DIAGNOSIS — L719 Rosacea, unspecified: Secondary | ICD-10-CM

## 2019-08-24 MED ORDER — SILDENAFIL CITRATE 20 MG PO TABS: ORAL_TABLET | ORAL | 2 refills | Status: DC

## 2019-08-24 MED ORDER — TESTOSTERONE CYPIONATE 200 MG/ML IM SOLN: 200.0000 mg | INTRAMUSCULAR | 5 refills | Status: DC

## 2019-08-24 MED ORDER — METRONIDAZOLE 1 % EX GEL: Freq: Every day | CUTANEOUS | 5 refills | Status: DC

## 2019-08-24 MED FILL — SILDENAFIL CITRATE 20 MG TA: 20 | 6 days supply | Qty: 30 | Fill #0

## 2019-08-24 NOTE — Patient Instructions (Signed)
Consider eucerin for legs when dry in the winter.   Try 2 tablets (40mg ) up to 5 tablets (100mg ) of sildenafil when needed to see if this helps with erection. Let me know if this is not effective.

## 2019-08-24 NOTE — Progress Notes (Signed)
Jeffrey Price DOB: 1956/05/23 Encounter date: 08/24/2019  This is a 63 y.o. male who presents with No chief complaint on file.   History of present illness:  Recently saw cardiology; yearly follow up suggested. Mentioned consideration for saphenous vein ablation in November.  HTN: atenolol; follows with Dr Johnsie Cancel.   Hx of bilat LE DVT: saw vasc surg 06/08/19: lasix helps with swelling, compression stockings - wears these when he is driving.   HL: lipitor 20mg   Graves disease: methimazole 5mg  daily; follows with Dr. Cruzita Lederer.   Peripheral neuropathy: lyrica 200mg  BID.  Pain from back. This helps, but is not taking away discomfort. Didn't tolerate gabapentin in past.   Hx of adenomatous colon polyps; due for repeat colonoscopy 12/2020 (Dr. Carlean Purl).  Chronic back pain: Going next week to get final injection.  Pain is due to-year-old driving issue. Flexeril just at bedtime. Robaxin is limited to just as needed. Meloxicam just as needed.   Does feel like he is getting some improvement with discomfort but still has significant pain.  DMII: A1C 2/28 was 6.6. Not on medication.   GERD: noted on endoscopy; not having reflux symptoms. Takes protonix daily.   Was taking doxy for oily skin; acne. It seemed to help with oil. Has not taken this in 6 months. Mainly around nose. Having to wash face multiple times daily to control oil. Started back on the doxycycline. Still getting oil and still getting the acne. Wondering about something else to add for this.   Low testosterone - was on replacement but then started to worry about side effects with medication so stopped.   Sees Dr. Truman Hayward for eye check annually.   Takes robaxin if pain during day. Takes the flexeril at bedtime if needed. Takes the meloxicam if needed.   No Known Allergies Current Meds  Medication Sig  . acetaminophen (TYLENOL) 325 MG tablet Take 650 mg by mouth every 6 (six) hours as needed.  Marland Kitchen aspirin 81 MG chewable  tablet Chew 81 mg by mouth daily.  Marland Kitchen atenolol (TENORMIN) 100 MG tablet Take 1 tablet (100 mg total) by mouth daily.  Marland Kitchen atorvastatin (LIPITOR) 20 MG tablet Take 1 tablet (20 mg total) by mouth daily.  . cyclobenzaprine (FLEXERIL) 10 MG tablet Take 1 tablet by mouth at bedtime for nerve pain  . furosemide (LASIX) 40 MG tablet Take 1 tablet (40 mg total) by mouth daily.  . meloxicam (MOBIC) 15 MG tablet Take 15 mg by mouth daily.   . methimazole (TAPAZOLE) 5 MG tablet Take 5 mg in a.m.  Marland Kitchen methocarbamol (ROBAXIN) 500 MG tablet Take 500 mg by mouth as needed.   Marland Kitchen NEEDLE, DISP, 22 G (BD ECLIPSE NEEDLE) 22G X 1-1/2" MISC Use as directed  . pantoprazole (PROTONIX) 40 MG tablet Take 1 tablet (40 mg total) by mouth daily.  . pregabalin (LYRICA) 200 MG capsule TAKE 1 CAPSULE BY MOUTH 2 TIMES DAILY. DO NOT TAKE GABAPENTIN WHILE TAKING THIS MEDICATION.  Marland Kitchen Syringe, Disposable, 3 ML MISC Use as directed  . testosterone cypionate (DEPO-TESTOSTERONE) 200 MG/ML injection Inject 1 mL (200 mg total) into the muscle every 14 (fourteen) days.  Marland Kitchen triamcinolone cream (KENALOG) 0.1 % Apply 1 application topically 2 (two) times daily.  . [DISCONTINUED] testosterone cypionate (DEPO-TESTOSTERONE) 200 MG/ML injection Inject 1 mL (200 mg total) into the muscle every 14 (fourteen) days.    Review of Systems  Constitutional: Negative for chills, fatigue and fever.  Respiratory: Negative for cough, chest tightness, shortness of  breath and wheezing.   Cardiovascular: Negative for chest pain, palpitations and leg swelling.  Genitourinary:       Difficulty getting and maintaining erection.  Has not had morning erections years.  Able to get some erection but not firm and does not last.  Has not tried any erectile medications at this point.  Musculoskeletal: Positive for back pain.  Skin: Positive for color change (increased redness face, acne).    Objective:  BP 110/60 (BP Location: Left Arm, Patient Position: Sitting, Cuff  Size: Normal)   Pulse 73   Temp 97.7 F (36.5 C) (Temporal)   Wt 282 lb (127.9 kg)   SpO2 95%   BMI 36.21 kg/m   Weight: 282 lb (127.9 kg)   BP Readings from Last 3 Encounters:  08/24/19 110/60  08/10/19 123/85  06/08/19 103/69   Wt Readings from Last 3 Encounters:  08/24/19 282 lb (127.9 kg)  08/10/19 279 lb 3.2 oz (126.6 kg)  07/13/19 279 lb 3.2 oz (126.6 kg)    Physical Exam Constitutional:      General: He is not in acute distress.    Appearance: He is well-developed.  Cardiovascular:     Rate and Rhythm: Normal rate and regular rhythm.     Heart sounds: Normal heart sounds. No murmur. No friction rub.  Pulmonary:     Effort: Pulmonary effort is normal. No respiratory distress.     Breath sounds: Normal breath sounds. No wheezing or rales.  Musculoskeletal:     Right lower leg: No edema.     Left lower leg: No edema.  Skin:    Comments: Erythema nose, cheeks. Skin is quite oily on nose, forehead. Couple of pustules noted on nose.  Neurological:     Mental Status: He is alert and oriented to person, place, and time.  Psychiatric:        Behavior: Behavior normal.     Assessment/Plan  1. Erectile dysfunction, unspecified erectile dysfunction type Hopefully will get some improvement with testosterone replacement.  Additionally we will try sildenafil.  Advised taking 2 to 5 tablets prior to intercourse.  Let me know if this is not effective. - sildenafil (REVATIO) 20 MG tablet; Take 2-5 tablets prior to intercourse.  Dispense: 30 tablet; Refill: 2  2. Rosacea Continue with doxycycline she seems to get some benefit from, but also will add MetroGel topically. - metroNIDAZOLE (METROGEL) 1 % gel; Apply topically daily.  Dispense: 45 g; Refill: 5  3. Low testosterone Plan to recheck testosterone level in 3 months time. - testosterone cypionate (DEPO-TESTOSTERONE) 200 MG/ML injection; Inject 1 mL (200 mg total) into the muscle every 14 (fourteen) days.  Dispense: 6 mL;  Refill: 5 - Testosterone; Future  4. Essential hypertension Stable.  Continue current medication.  5. Controlled type 2 diabetes mellitus with diabetic polyneuropathy, without long-term current use of insulin (HCC) Currently diet controlled.  Will recheck A1c in 3 months time with testosterone levels.  6. Other polyneuropathy Taking Lyrica with some relief.  Also working with spinal specialist for back injections.  7. Graves disease Following with endocrinology.  On methimazole.   Return in about 3 months (around 11/24/2019) for lab visit.    Micheline Rough, MD

## 2019-08-29 ENCOUNTER — Telehealth: Payer: Self-pay | Admitting: Family Medicine

## 2019-08-29 MED FILL — metroNIDAZOLE 1 % GEL: 1 | 30 days supply | Qty: 60 | Fill #0

## 2019-08-29 MED FILL — BD NEEDLES 22GX1.5": 22G X 1-1/2 | 28 days supply | Qty: 4 | Fill #1

## 2019-08-29 MED FILL — BD NEEDLES 22GX1.5: 22G X 1-1/2 | 28 days supply | Qty: 4 | Fill #1

## 2019-08-29 MED FILL — BD 3 ML SYRINGE 18GX1-1/2": 18G X 1-1/2 | 28 days supply | Qty: 2 | Fill #0

## 2019-08-29 MED FILL — BD 3 ML SYRINGE 18GX1-1/2: 18G X 1-1/2 | 28 days supply | Qty: 2 | Fill #0

## 2019-08-29 MED FILL — TESTOSTERONE CYP 200 MG/ML: 200 | 28 days supply | Qty: 2 | Fill #1

## 2019-08-29 NOTE — Telephone Encounter (Signed)
I called [facility], spoke with Junie Panning and informed her of the message below.

## 2019-08-29 NOTE — Telephone Encounter (Signed)
I don't have an end date for this. He will be using for rosacea which may require long term treatment.

## 2019-08-29 NOTE — Telephone Encounter (Signed)
metroNIDAZOLE (METROGEL) 1 % gel [facility] from Pharmacy called requesting end date for this Rx, needed for insurance purposes. Please advise

## 2019-08-29 NOTE — Telephone Encounter (Signed)
Pharmacy requesting call back from CMA to discuss this medication.

## 2019-08-30 DIAGNOSIS — M48062 Spinal stenosis, lumbar region with neurogenic claudication: Secondary | ICD-10-CM | POA: Diagnosis not present

## 2019-09-03 ENCOUNTER — Other Ambulatory Visit: Payer: Self-pay | Admitting: Family Medicine

## 2019-09-03 MED FILL — FUROSEMIDE 40 MG TAB: 40 | 90 days supply | Qty: 90 | Fill #3

## 2019-09-03 MED FILL — DOXYCYCLINE HYCLATE 100 MG: 100 | 30 days supply | Qty: 30 | Fill #2

## 2019-09-03 MED FILL — MELOXICAM 15 MG TABLET: 15 | 60 days supply | Qty: 60 | Fill #1

## 2019-09-05 MED FILL — CYCLOBENZAPRINE HCL 10 MG T: 10 | 30 days supply | Qty: 30 | Fill #0

## 2019-09-06 ENCOUNTER — Telehealth: Payer: Self-pay | Admitting: *Deleted

## 2019-09-06 DIAGNOSIS — E785 Hyperlipidemia, unspecified: Secondary | ICD-10-CM

## 2019-09-06 NOTE — Telephone Encounter (Signed)
[facility] faxed a refill request for Atorvastatin 20mg -#90.  Message sent to Dr Ethlyn Gallery as last rx was given by Dr Carlota Raspberry.

## 2019-09-07 MED ORDER — ATORVASTATIN CALCIUM 20 MG PO TABS: 20.0000 mg | ORAL_TABLET | Freq: Every day | ORAL | 1 refills | Status: DC

## 2019-09-07 MED FILL — ATORVASTATIN 20 MG TABLET: 20 | 90 days supply | Qty: 90 | Fill #0

## 2019-09-13 ENCOUNTER — Other Ambulatory Visit: Payer: Self-pay

## 2019-09-13 ENCOUNTER — Ambulatory Visit: Payer: BC Managed Care – PPO | Admitting: Vascular Surgery

## 2019-09-13 ENCOUNTER — Encounter: Payer: Self-pay | Admitting: Vascular Surgery

## 2019-09-13 VITALS — BP 116/63 | HR 72 | Temp 98.6°F | Resp 18 | Ht 74.0 in | Wt 284.0 lb

## 2019-09-13 DIAGNOSIS — I872 Venous insufficiency (chronic) (peripheral): Secondary | ICD-10-CM | POA: Diagnosis not present
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
## 2019-09-20 MED FILL — SILDENAFIL CITRATE 20 MG TA: 20 | 6 days supply | Qty: 30 | Fill #1

## 2019-09-20 MED FILL — methIMAzole 5 MG TABS: 5 | 90 days supply | Qty: 90 | Fill #4

## 2019-09-21 MED FILL — BD NEEDLES 22GX1.5: 22G X 1-1/2 | 28 days supply | Qty: 4 | Fill #2

## 2019-09-21 MED FILL — BD 3 ML SYRINGE 18GX1-1/2: 18G X 1-1/2 | 28 days supply | Qty: 2 | Fill #1

## 2019-09-21 MED FILL — BD 3 ML SYRINGE 18GX1-1/2": 18G X 1-1/2 | 28 days supply | Qty: 2 | Fill #1

## 2019-09-21 MED FILL — TESTOSTERONE CYP 200 MG/ML: 200 | 28 days supply | Qty: 2 | Fill #2

## 2019-09-21 MED FILL — BD NEEDLES 22GX1.5": 22G X 1-1/2 | 28 days supply | Qty: 4 | Fill #2

## 2019-09-25 ENCOUNTER — Other Ambulatory Visit: Payer: Self-pay | Admitting: *Deleted

## 2019-09-25 DIAGNOSIS — I83813 Varicose veins of bilateral lower extremities with pain: Secondary | ICD-10-CM

## 2019-09-26 ENCOUNTER — Telehealth: Payer: Self-pay | Admitting: Family Medicine

## 2019-09-26 ENCOUNTER — Other Ambulatory Visit: Payer: Self-pay | Admitting: Family Medicine

## 2019-09-26 DIAGNOSIS — G629 Polyneuropathy, unspecified: Secondary | ICD-10-CM

## 2019-09-26 NOTE — Telephone Encounter (Signed)
Patient requesting pregabalin (LYRICA) 200 MG capsule refill stating he requested this medication a few days ago and will run out tomorrow, informed patient please allow 48 to 72 hour turn around   [place], [place]

## 2019-09-26 NOTE — Telephone Encounter (Signed)
Requested medication (s) are due for refill today: yes  Requested medication (s) are on the active medication list: yes  Last refill:  06/24/2019 by Dr Carlota Raspberry  Future visit scheduled: no  Notes to clinic:  Not delegated    Requested Prescriptions  Pending Prescriptions Disp Refills   pregabalin (LYRICA) 200 MG capsule [Pharmacy Med Name: PREGABALIN 200 MG CAPS 200 Capsule] 180 capsule 0    Sig: TAKE 1 CAPSULE BY MOUTH 2 TIMES DAILY. DO NOT TAKE GABAPENTIN WHILE TAKING THIS MEDICATION.     Not Delegated - Neurology:  Anticonvulsants - Controlled Failed - 09/26/2019  3:47 PM      Failed - This refill cannot be delegated      Passed - Valid encounter within last 12 months    Recent Outpatient Visits          1 month ago Erectile dysfunction, unspecified erectile dysfunction type   [facility] at [employer], Steele Berg, MD   2 months ago Graves disease   [facility]rapist, [facility] at [employer], Steele Berg, MD   9 months ago Peripheral edema   [facility] [place], MD   10 months ago Essential hypertension   [facility] at [facility], Ranell Patrick, MD   12 months ago Flu vaccine need   [facility] at [facility], Fenton Malling, MD

## 2019-09-27 ENCOUNTER — Other Ambulatory Visit: Payer: Self-pay | Admitting: Family Medicine

## 2019-09-27 DIAGNOSIS — G629 Polyneuropathy, unspecified: Secondary | ICD-10-CM

## 2019-09-27 MED ORDER — PREGABALIN 200 MG PO CAPS: ORAL_CAPSULE | ORAL | 1 refills | Status: DC

## 2019-09-27 MED FILL — PREGABALIN 200 MG CAPS: 200 | 90 days supply | Qty: 180 | Fill #0

## 2019-09-27 NOTE — Telephone Encounter (Signed)
Done

## 2019-09-27 NOTE — Telephone Encounter (Signed)
Noted  

## 2019-10-01 ENCOUNTER — Other Ambulatory Visit: Payer: Self-pay | Admitting: Family Medicine

## 2019-10-01 MED FILL — DOXYCYCLINE HYCLATE 100 MG: 100 | 90 days supply | Qty: 90 | Fill #0

## 2019-10-01 MED FILL — PANTOPRAZOLE SOD DR 40 MG T: 40 | 90 days supply | Qty: 90 | Fill #1

## 2019-10-15 MED FILL — CYCLOBENZAPRINE HCL 10 MG T: 10 | 30 days supply | Qty: 30 | Fill #1

## 2019-10-16 ENCOUNTER — Encounter: Payer: Self-pay | Admitting: Vascular Surgery

## 2019-10-17 ENCOUNTER — Other Ambulatory Visit: Payer: Self-pay | Admitting: Family Medicine

## 2019-10-17 DIAGNOSIS — R7989 Other specified abnormal findings of blood chemistry: Secondary | ICD-10-CM

## 2019-10-17 MED FILL — BD 3 ML SYRINGE 18GX1-1/2: 18G X 1-1/2 | 28 days supply | Qty: 2 | Fill #2

## 2019-10-17 MED FILL — BD NEEDLES 22GX1.5": 22G X 1-1/2 | 28 days supply | Qty: 4 | Fill #3

## 2019-10-17 MED FILL — TESTOSTERONE CYP 200 MG/ML: 200 | 28 days supply | Qty: 2 | Fill #0

## 2019-10-17 MED FILL — BD 3 ML SYRINGE 18GX1-1/2": 18G X 1-1/2 | 28 days supply | Qty: 2 | Fill #2

## 2019-10-17 MED FILL — BD NEEDLES 22GX1.5: 22G X 1-1/2 | 28 days supply | Qty: 4 | Fill #3

## 2019-10-18 ENCOUNTER — Other Ambulatory Visit: Payer: Self-pay

## 2019-10-18 ENCOUNTER — Ambulatory Visit (INDEPENDENT_AMBULATORY_CARE_PROVIDER_SITE_OTHER): Payer: No Typology Code available for payment source | Admitting: Vascular Surgery

## 2019-10-18 ENCOUNTER — Encounter: Payer: Self-pay | Admitting: Vascular Surgery

## 2019-10-18 VITALS — BP 151/89 | HR 87 | Temp 98.2°F | Resp 16 | Ht 74.0 in | Wt 284.0 lb

## 2019-10-18 DIAGNOSIS — I83812 Varicose veins of left lower extremities with pain: Secondary | ICD-10-CM

## 2019-10-18 DIAGNOSIS — I872 Venous insufficiency (chronic) (peripheral): Secondary | ICD-10-CM

## 2019-10-18 HISTORY — PX: ENDOVENOUS ABLATION SAPHENOUS VEIN W/ LASER: SUR449

## 2019-10-18 NOTE — Progress Notes (Signed)
     Laser Ablation Procedure    Date: 10/18/2019   Jeffrey Price DOB:[birth year]  Consent signed: Yes    Surgeon: Gae Gallop MD   Procedure: Laser Ablation: left Greater Saphenous Vein  BP (!) 151/89 (BP Location: Left Arm, Patient Position: Sitting, Cuff Size: Large)   Pulse 87   Temp 98.2 F (36.8 C) (Temporal)   Resp 16   Ht 6\' 2"  (1.88 m)   Wt 284 lb (128.8 kg)   SpO2 94%   BMI 36.46 kg/m   Tumescent Anesthesia: 700 cc 0.9% NaCl with 50 cc Lidocaine HCL 1%  and 15 cc 8.4% NaHCO3  Local Anesthesia: 12 cc Lidocaine HCL and NaHCO3 (ratio 2:1)  15 watts continuous mode        Total energy: 2654Joules   Total time: 2:56  Laser Fiber Ref. # [id number]      Lot # [id number]   Stab Phlebectomy: >20 Sites: Thigh and Calf  Patient tolerated procedure well  Notes: Patient wore face mask.  All staff members wore facial masks and facial shields/goggles.    Description of Procedure:  After marking the course of the secondary varicosities, the patient was placed on the operating table in the supine position, and the left leg was prepped and draped in sterile fashion.   Local anesthetic was administered and under ultrasound guidance the saphenous vein was accessed with a micro needle and guide wire; then the mirco puncture sheath was placed.  A guide wire was inserted saphenofemoral junction , followed by a 5 french sheath.  The position of the sheath and then the laser fiber below the junction was confirmed using the ultrasound.  Tumescent anesthesia was administered along the course of the saphenous vein using ultrasound guidance. The patient was placed in Trendelenburg position and protective laser glasses were placed on patient and staff, and the laser was fired at 15 watts continuous mode advancing 1-64mm/second for a total of 2654 joules.   For stab phlebectomies, local anesthetic was administered at the previously marked varicosities, and tumescent anesthesia was administered  around the vessels.  Greater than 20 stab wounds were made using the tip of an 11 blade. And using the vein hook, the phlebectomies were performed using a hemostat to avulse the varicosities.  Adequate hemostasis was achieved.     Steri strips were applied to the stab wounds and ABD pads and thigh high compression stockings were applied.  Ace wrap bandages were applied over the phlebectomy sites and at the top of the saphenofemoral junction. Blood loss was less than 15 cc.  Discharge instructions reviewed with patient and hardcopy of discharge instructions given to patient to take home. The patient ambulated out of the operating room having tolerated the procedure well.

## 2019-10-18 NOTE — Progress Notes (Signed)
Patient name: Jeffrey Price MRN: [id number] DOB: September 08, 1956 Sex: male  REASON FOR VISIT: For laser ablation left great saphenous vein with greater than 20 stab phlebectomies.  HPI: Jeffrey Price is a 63 y.o. male who presented with aching pain and heaviness in both lower extremities and was found to have significant reflux in both great saphenous veins with painful varicose veins.  He failed conservative treatment.  He presents for left great saphenous vein ablation in greater than 20 stabs.  Current Outpatient Medications  Medication Sig Dispense Refill  . acetaminophen (TYLENOL) 325 MG tablet Take 650 mg by mouth every 6 (six) hours as needed.    Marland Kitchen aspirin 81 MG chewable tablet Chew 81 mg by mouth daily.    Marland Kitchen atenolol (TENORMIN) 100 MG tablet Take 1 tablet (100 mg total) by mouth daily. 90 tablet 1  . atorvastatin (LIPITOR) 20 MG tablet Take 1 tablet (20 mg total) by mouth daily. 90 tablet 1  . cyclobenzaprine (FLEXERIL) 10 MG tablet TAKE 1 TABLET BY MOUTH AT BEDTIME FOR NERVE PAIN 30 tablet 2  . doxycycline (VIBRA-TABS) 100 MG tablet TAKE 1 TABLET (100 MG TOTAL) BY MOUTH DAILY. 90 tablet 0  . furosemide (LASIX) 40 MG tablet Take 1 tablet (40 mg total) by mouth daily. 90 tablet 3  . meloxicam (MOBIC) 15 MG tablet Take 15 mg by mouth daily.     . methimazole (TAPAZOLE) 5 MG tablet Take 5 mg in a.m. 90 tablet 5  . methocarbamol (ROBAXIN) 500 MG tablet Take 500 mg by mouth as needed.     . metroNIDAZOLE (METROGEL) 1 % gel Apply topically daily. 45 g 5  . NEEDLE, DISP, 22 G (BD ECLIPSE NEEDLE) 22G X 1-1/2" MISC Use as directed 100 each 0  . pantoprazole (PROTONIX) 40 MG tablet Take 1 tablet (40 mg total) by mouth daily. 90 tablet 1  . pregabalin (LYRICA) 200 MG capsule TAKE 1 CAPSULE BY MOUTH 2 TIMES DAILY. DO NOT TAKE GABAPENTIN WHILE TAKING THIS MEDICATION. 180 capsule 1  . sildenafil (REVATIO) 20 MG tablet Take 2-5 tablets prior to intercourse. 30 tablet 2  . Syringe, Disposable, 3 ML  MISC Use as directed 100 each 0  . testosterone cypionate (DEPOTESTOSTERONE CYPIONATE) 200 MG/ML injection INJECT 1 ML INTO THE MUSCLE EVERY 14 DAYS 2 mL 5  . triamcinolone cream (KENALOG) 0.1 % Apply 1 application topically 2 (two) times daily. 60 g 2   No current facility-administered medications for this visit.    PHYSICAL EXAM: Vitals:   10/18/19 1056  BP: (!) 151/89  Pulse: 87  Resp: 16  Temp: 98.2 F (36.8 C)  TempSrc: Temporal  SpO2: 94%  Weight: 284 lb (128.8 kg)  Height: 6\' 2"  (1.88 m)    MEDICAL ISSUES:  LASER ABLATION LEFT GREAT SAPHENOUS VEIN/GREATER THAN 20 STAB PHLEBECTOMIES: Patient was taken to the exam room and the dilated veins were marked with the patient standing.  The patient was then placed supine.  I interrogated the left great saphenous vein with the SonoSite and decided to cannulate this at the level of the knee.  The left leg was prepped and draped in usual sterile fashion.  Under ultrasound guidance, after the skin was anesthetized, the left great saphenous vein was cannulated at the level of the knee with a micropuncture needle and a micropuncture sheath introduced over the wire.  I then advanced the J-wire into the saphenous vein just below the saphenofemoral junction, approximately 3 cm.  The  dilator and wire were removed.  The laser fiber was positioned outside the sheath approximately 3 cm from the saphenofemoral junction.  Tumescent anesthesia was administered the entire length of the vein.  The patient was then placed in Trendelenburg.  Laser ablation was performed of the left great saphenous vein from 3 cm distal to the saphenofemoral junction to the knee.  2654 J of energy were used.  Of note he was 6 feet 2 inches tall.  Next tumescent anesthesia was administered around all the marked varicose veins.  Using small stab incisions with an 11 blade the vein was swept and then retracted above the skin and gently excised using hemostats.  Pressure was held for  hemostasis.  Sterile dressing was applied.  He will return next week for follow-up duplex.  Deitra Mayo [facility] [place] [phone]

## 2019-10-24 ENCOUNTER — Telehealth (HOSPITAL_COMMUNITY): Payer: Self-pay | Admitting: *Deleted

## 2019-10-24 NOTE — Telephone Encounter (Signed)

## 2019-10-25 ENCOUNTER — Ambulatory Visit (INDEPENDENT_AMBULATORY_CARE_PROVIDER_SITE_OTHER): Payer: Self-pay | Admitting: Vascular Surgery

## 2019-10-25 ENCOUNTER — Other Ambulatory Visit: Payer: Self-pay

## 2019-10-25 ENCOUNTER — Ambulatory Visit (HOSPITAL_COMMUNITY)
Admission: RE | Admit: 2019-10-25 | Discharge: 2019-10-25 | Disposition: A | Discharge status: Home / Self Care | Payer: No Typology Code available for payment source | Source: Ambulatory Visit | Attending: Vascular Surgery | Admitting: Vascular Surgery

## 2019-10-25 ENCOUNTER — Encounter: Payer: Self-pay | Admitting: Vascular Surgery

## 2019-10-25 VITALS — BP 125/73 | HR 62 | Temp 98.2°F | Resp 18 | Ht 74.0 in | Wt 284.0 lb

## 2019-10-25 DIAGNOSIS — I83813 Varicose veins of bilateral lower extremities with pain: Secondary | ICD-10-CM

## 2019-10-25 DIAGNOSIS — I872 Venous insufficiency (chronic) (peripheral): Secondary | ICD-10-CM

## 2019-10-25 NOTE — Progress Notes (Signed)
Patient name: Jeffrey Price MRN: [id number] DOB: 1956/10/30 Sex: male  REASON FOR VISIT: Follow-up after laser ablation of the left great saphenous vein with greater than 20 stab phlebectomies  HPI: Jeffrey Price is a 63 y.o. male who had presented with aching pain and heaviness in both lower extremities and was found to have significant reflux in both great saphenous veins with painful varicose veins.  He failed conservative treatment.  On 10/18/2019 he underwent laser ablation of left great saphenous vein from 3 cm distal to the saphenofemoral junction to the level of the knee.  He comes in for a 1 week follow-up visit.  SYMPTOMS: He has some mild aching pain where the vein was ablated but states that the pain in his lower leg has improved.  He continues to have aching pain associated with his varicose veins in the right leg.  CONSERVATIVE TREATMENT: He continues to wear his thigh-high stockings which she will do for another week at least.  He continues to elevate his leg.  VENOUS INTERVENTIONS: He is undergone successful ablation of the left great saphenous vein with greater than 20 stabs on 10/18/2019 he scheduled for laser ablation of the right great saphenous vein on 11/08/2019 with 10-20 stabs  Current Outpatient Medications  Medication Sig Dispense Refill  . acetaminophen (TYLENOL) 325 MG tablet Take 650 mg by mouth every 6 (six) hours as needed.    Marland Kitchen aspirin 81 MG chewable tablet Chew 81 mg by mouth daily.    Marland Kitchen atenolol (TENORMIN) 100 MG tablet Take 1 tablet (100 mg total) by mouth daily. 90 tablet 1  . atorvastatin (LIPITOR) 20 MG tablet Take 1 tablet (20 mg total) by mouth daily. 90 tablet 1  . cyclobenzaprine (FLEXERIL) 10 MG tablet TAKE 1 TABLET BY MOUTH AT BEDTIME FOR NERVE PAIN 30 tablet 2  . doxycycline (VIBRA-TABS) 100 MG tablet TAKE 1 TABLET (100 MG TOTAL) BY MOUTH DAILY. 90 tablet 0  . furosemide (LASIX) 40 MG tablet Take 1 tablet (40 mg total) by mouth daily. 90 tablet 3    . meloxicam (MOBIC) 15 MG tablet Take 15 mg by mouth daily.     . methimazole (TAPAZOLE) 5 MG tablet Take 5 mg in a.m. 90 tablet 5  . methocarbamol (ROBAXIN) 500 MG tablet Take 500 mg by mouth as needed.     . metroNIDAZOLE (METROGEL) 1 % gel Apply topically daily. 45 g 5  . NEEDLE, DISP, 22 G (BD ECLIPSE NEEDLE) 22G X 1-1/2" MISC Use as directed 100 each 0  . pantoprazole (PROTONIX) 40 MG tablet Take 1 tablet (40 mg total) by mouth daily. 90 tablet 1  . pregabalin (LYRICA) 200 MG capsule TAKE 1 CAPSULE BY MOUTH 2 TIMES DAILY. DO NOT TAKE GABAPENTIN WHILE TAKING THIS MEDICATION. 180 capsule 1  . sildenafil (REVATIO) 20 MG tablet Take 2-5 tablets prior to intercourse. 30 tablet 2  . Syringe, Disposable, 3 ML MISC Use as directed 100 each 0  . testosterone cypionate (DEPOTESTOSTERONE CYPIONATE) 200 MG/ML injection INJECT 1 ML INTO THE MUSCLE EVERY 14 DAYS 2 mL 5  . triamcinolone cream (KENALOG) 0.1 % Apply 1 application topically 2 (two) times daily. 60 g 2   No current facility-administered medications for this visit.   REVIEW OF SYSTEMS: Valu.Nieves ] denotes positive finding; [  ] denotes negative finding  CARDIOVASCULAR:  [ ]  chest pain   [ ]  dyspnea on exertion  [ ]  leg swelling  CONSTITUTIONAL:  [ ]  fever   [ ]   chills  PHYSICAL EXAM: Vitals:   10/25/19 1042  BP: 125/73  Pulse: 62  Resp: 18  Temp: 98.2 F (36.8 C)  TempSrc: Temporal  SpO2: 94%  Weight: 284 lb (128.8 kg)  Height: 6\' 2"  (1.88 m)   GENERAL: The patient is a well-nourished male, in no acute distress. The vital signs are documented above. CARDIOVASCULAR: There is a regular rate and rhythm. PULMONARY: There is good air exchange bilaterally without wheezing or rales. He has mild bruising in the proximal left thigh.  His stab incisions are all healing nicely.  DATA:  VENOUS DUPLEX: I have independently interpreted his venous duplex scan today.  There is no evidence of DVT.  The left great saphenous vein is closed from 2.15  cm from the saphenofemoral junction to the level of the knee.  MEDICAL ISSUES:  PAINFUL VARICOSE VEINS BILATERALLY: Patient has undergone laser ablation now with the left great saphenous vein with greater than 20 stabs stockings.  His duplex scan shows no evidence of DVT and successful closure of the vein to the level of the knee.  The patient is doing well and his symptoms in the left leg have improved.  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  He is scheduled for laser ablation of the right great saphenous vein with 10-20 stab phlebectomies on 11/08/2019.  Deitra Mayo [facility] [place] [phone]

## 2019-10-29 MED FILL — ATENOLOL 100 MG TABLET: 100 | 90 days supply | Qty: 90 | Fill #1

## 2019-10-29 MED FILL — SILDENAFIL CITRATE 20 MG TA: 20 | 6 days supply | Qty: 30 | Fill #2

## 2019-11-04 LAB — HM DIABETES EYE EXAM

## 2019-11-08 ENCOUNTER — Encounter: Payer: Self-pay | Admitting: Vascular Surgery

## 2019-11-08 ENCOUNTER — Ambulatory Visit (INDEPENDENT_AMBULATORY_CARE_PROVIDER_SITE_OTHER): Payer: No Typology Code available for payment source | Admitting: Vascular Surgery

## 2019-11-08 ENCOUNTER — Other Ambulatory Visit: Payer: Self-pay

## 2019-11-08 VITALS — BP 120/77 | HR 53 | Temp 97.3°F | Resp 16 | Ht 74.0 in | Wt 284.0 lb

## 2019-11-08 DIAGNOSIS — I83811 Varicose veins of right lower extremities with pain: Secondary | ICD-10-CM | POA: Diagnosis not present

## 2019-11-08 HISTORY — PX: ENDOVENOUS ABLATION SAPHENOUS VEIN W/ LASER: SUR449

## 2019-11-08 NOTE — Progress Notes (Signed)
Patient name: Jeffrey Price MRN: [id number] DOB: 15-Mar-1956 Sex: male  REASON FOR VISIT: For laser ablation of the right great saphenous vein with 10-20 stabs  HPI: Jeffrey Price is a 63 y.o. male who underwent laser ablation of left great saphenous vein with greater than 20 stabs on 10/18/2019.  He has persistent symptomatic varicose veins of the right lower extremity that is failed conservative treatment.  He presents for laser ablation of the right great saphenous vein with 10-20 stabs.  When I previously looked at the vein it looks like I could cannulate the vein in the distal thigh.  Current Outpatient Medications  Medication Sig Dispense Refill  . acetaminophen (TYLENOL) 325 MG tablet Take 650 mg by mouth every 6 (six) hours as needed.    Marland Kitchen aspirin 81 MG chewable tablet Chew 81 mg by mouth daily.    Marland Kitchen atenolol (TENORMIN) 100 MG tablet Take 1 tablet (100 mg total) by mouth daily. 90 tablet 1  . atorvastatin (LIPITOR) 20 MG tablet Take 1 tablet (20 mg total) by mouth daily. 90 tablet 1  . cyclobenzaprine (FLEXERIL) 10 MG tablet TAKE 1 TABLET BY MOUTH AT BEDTIME FOR NERVE PAIN 30 tablet 2  . doxycycline (VIBRA-TABS) 100 MG tablet TAKE 1 TABLET (100 MG TOTAL) BY MOUTH DAILY. 90 tablet 0  . furosemide (LASIX) 40 MG tablet Take 1 tablet (40 mg total) by mouth daily. 90 tablet 3  . meloxicam (MOBIC) 15 MG tablet Take 15 mg by mouth daily.     . methimazole (TAPAZOLE) 5 MG tablet Take 5 mg in a.m. 90 tablet 5  . methocarbamol (ROBAXIN) 500 MG tablet Take 500 mg by mouth as needed.     . metroNIDAZOLE (METROGEL) 1 % gel Apply topically daily. 45 g 5  . NEEDLE, DISP, 22 G (BD ECLIPSE NEEDLE) 22G X 1-1/2" MISC Use as directed 100 each 0  . pantoprazole (PROTONIX) 40 MG tablet Take 1 tablet (40 mg total) by mouth daily. 90 tablet 1  . pregabalin (LYRICA) 200 MG capsule TAKE 1 CAPSULE BY MOUTH 2 TIMES DAILY. DO NOT TAKE GABAPENTIN WHILE TAKING THIS MEDICATION. 180 capsule 1  . sildenafil  (REVATIO) 20 MG tablet Take 2-5 tablets prior to intercourse. 30 tablet 2  . Syringe, Disposable, 3 ML MISC Use as directed 100 each 0  . testosterone cypionate (DEPOTESTOSTERONE CYPIONATE) 200 MG/ML injection INJECT 1 ML INTO THE MUSCLE EVERY 14 DAYS 2 mL 5  . triamcinolone cream (KENALOG) 0.1 % Apply 1 application topically 2 (two) times daily. 60 g 2   No current facility-administered medications for this visit.    PHYSICAL EXAM: Vitals:   11/08/19 1100  BP: 120/77  Pulse: (!) 53  Resp: 16  Temp: (!) 97.3 F (36.3 C)  TempSrc: Temporal  SpO2: 96%  Weight: 284 lb (128.8 kg)  Height: 6\' 2"  (1.88 m)    MEDICAL ISSUES:  LASER ABLATION RIGHT GREAT SAPHENOUS VEIN/10-20 STABS: The patient was taken to the exam room and his dilated veins were marked with the patient standing.  Patient was then placed supine and I looked at the great saphenous vein with the SonoSite.  I felt that I could cannulate the vein at the level of the knee.  The right leg was prepped and draped in usual sterile fashion.  Under ultrasound guidance, after the skin was anesthetized, I cannulated the right great saphenous vein at the knee with a micropuncture needle and a micropuncture sheath was introduced over a  wire.  I then advanced the J-wire to the saphenofemoral junction and the micro dilator sheath was removed.  I then advanced a 45 cm sheath over the wire and positioned at 2-1/2 cm distal to the saphenofemoral junction.  I did measure this.  The wire and dilator were removed.  I then advanced the laser fiber to the end of the sheath and the sheath was retracted.  This was distal to the superficial epigastric vein.  Next, tumescent anesthesia was administered circumferentially around the entire right great saphenous vein to the saphenofemoral junction.  The patient was placed in Trendelenburg.  Laser ablation was then performed to the right great saphenous vein from 2-1/2 cm distal to the saphenofemoral junction to  the knee.  2417 J of energy were used.  Of note he is 6 foot 2 inches tall.  Next small stab incisions were made over the marked areas.  Approximately 12 stab incisions were made.  The veins were hooked and then brought above the skin and gently excised using blunt dissection using a hemostat.  Sterile dressing was applied.  Patient tolerated procedure well.  He will return on January 7 for follow-up duplex.  Deitra Mayo [facility] [phone]

## 2019-11-08 NOTE — Progress Notes (Signed)
     Laser Ablation Procedure    Date: 11/08/2019   Jeffrey Price DOB:11/25/1955  Consent signed: Yes    Surgeon: Gae Gallop MD   Procedure: Laser Ablation: right Greater Saphenous Vein  BP 120/77 (BP Location: Left Arm, Patient Position: Sitting, Cuff Size: Large)   Pulse (!) 53   Temp (!) 97.3 F (36.3 C) (Temporal)   Resp 16   Ht 6\' 2"  (1.88 m)   Wt 284 lb (128.8 kg)   SpO2 96%   BMI 36.46 kg/m   Tumescent Anesthesia: 500 cc 0.9% NaCl with 50 cc Lidocaine HCL 1%  and 15 cc 8.4% NaHCO3  Local Anesthesia: 8 cc Lidocaine HCL and NaHCO3 (ratio 2:1)  15 watts continuous mode        Total energy: 2417 Joules   Total time: 2:40  Laser Fiber Ref. # [id number]      Lot #  [id number]   Stab Phlebectomy: 10-20 Sites: Calf  Patient tolerated procedure well  Notes: Patient wore face mask.  All staff members wore facial masks and facial shields/goggles.    Description of Procedure:  After marking the course of the secondary varicosities, the patient was placed on the operating table in the supine position, and the right leg was prepped and draped in sterile fashion.   Local anesthetic was administered and under ultrasound guidance the saphenous vein was accessed with a micro needle and guide wire; then the mirco puncture sheath was placed.  A guide wire was inserted saphenofemoral junction , followed by a 5 french sheath.  The position of the sheath and then the laser fiber below the junction was confirmed using the ultrasound.  Tumescent anesthesia was administered along the course of the saphenous vein using ultrasound guidance. The patient was placed in Trendelenburg position and protective laser glasses were placed on patient and staff, and the laser was fired at 15 watts continuous mode advancing 1-50mm/second for a total of 2417 joules.   For stab phlebectomies, local anesthetic was administered at the previously marked varicosities, and tumescent anesthesia was administered  around the vessels.  Ten to 20 stab wounds were made using the tip of an 11 blade. And using the vein hook, the phlebectomies were performed using a hemostat to avulse the varicosities.  Adequate hemostasis was achieved.     Steri strips were applied to the stab wounds and ABD pads and thigh high compression stockings were applied.  Ace wrap bandages were applied over the phlebectomy sites and at the top of the saphenofemoral junction. Blood loss was less than 15 cc.  Discharge instructions reviewed with patient and hardcopy of discharge instructions given to patient to take home. The patient ambulated out of the operating room having tolerated the procedure well.

## 2019-11-14 ENCOUNTER — Other Ambulatory Visit: Payer: Self-pay | Admitting: Cardiovascular Disease

## 2019-11-14 MED FILL — BD NEEDLES 22GX1.5: 22G X 1-1/2 | 28 days supply | Qty: 4 | Fill #4

## 2019-11-14 MED FILL — BD NEEDLES 22GX1.5": 22G X 1-1/2 | 28 days supply | Qty: 4 | Fill #4

## 2019-11-14 MED FILL — BD 3 ML SYRINGE 18GX1-1/2": 18G X 1-1/2 | 28 days supply | Qty: 2 | Fill #3

## 2019-11-14 MED FILL — TESTOSTERONE CYP 200 MG/ML: 200 | 28 days supply | Qty: 2 | Fill #1

## 2019-11-14 MED FILL — BD 3 ML SYRINGE 18GX1-1/2: 18G X 1-1/2 | 28 days supply | Qty: 2 | Fill #3

## 2019-11-15 ENCOUNTER — Ambulatory Visit (INDEPENDENT_AMBULATORY_CARE_PROVIDER_SITE_OTHER): Payer: Self-pay | Admitting: Vascular Surgery

## 2019-11-15 ENCOUNTER — Other Ambulatory Visit: Payer: Self-pay

## 2019-11-15 ENCOUNTER — Encounter: Payer: Self-pay | Admitting: Vascular Surgery

## 2019-11-15 ENCOUNTER — Ambulatory Visit (HOSPITAL_COMMUNITY)
Admission: RE | Admit: 2019-11-15 | Discharge: 2019-11-15 | Disposition: A | Discharge status: Home / Self Care | Payer: No Typology Code available for payment source | Source: Ambulatory Visit | Attending: Vascular Surgery | Admitting: Vascular Surgery

## 2019-11-15 VITALS — BP 117/73 | HR 60 | Temp 98.3°F | Resp 16 | Ht 74.0 in | Wt 285.0 lb

## 2019-11-15 DIAGNOSIS — Z48812 Encounter for surgical aftercare following surgery on the circulatory system: Secondary | ICD-10-CM

## 2019-11-15 DIAGNOSIS — I83813 Varicose veins of bilateral lower extremities with pain: Secondary | ICD-10-CM | POA: Diagnosis present

## 2019-11-15 MED FILL — FUROSEMIDE 40 MG TAB: 40 | 90 days supply | Qty: 90 | Fill #0

## 2019-11-15 NOTE — Progress Notes (Signed)
Patient name: Jeffrey Price MRN: [id number] DOB: 1956-02-01 Sex: male  REASON FOR VISIT: Follow-up after endovenous laser ablation and stab phlebectomies  HPI: Jeffrey Price is a 64 y.o. male who underwent laser ablation of the left great saphenous vein with stab phlebectomies on 10/18/2019.  Subsequently, on 11/08/2019 he had laser ablation of the right great saphenous vein with 10-20 stab phlebectomies.  He comes in for a 1 week follow-up visit.    SYMPTOMS: He still has some aching pain in his legs.    CONSERVATIVE TREATMENT: He continues to wear his thigh-high compression stockings and will wear these for another week.  At that point he can switch to knee-high if he prefers.  He also has been elevating his legs.  VENOUS INTERVENTIONS: He has now had laser ablation and stab phlebectomies in both legs.  No further interventions are planned.  Current Outpatient Medications  Medication Sig Dispense Refill  . acetaminophen (TYLENOL) 325 MG tablet Take 650 mg by mouth every 6 (six) hours as needed.    Marland Kitchen aspirin 81 MG chewable tablet Chew 81 mg by mouth daily.    Marland Kitchen atenolol (TENORMIN) 100 MG tablet Take 1 tablet (100 mg total) by mouth daily. 90 tablet 1  . atorvastatin (LIPITOR) 20 MG tablet Take 1 tablet (20 mg total) by mouth daily. 90 tablet 1  . cyclobenzaprine (FLEXERIL) 10 MG tablet TAKE 1 TABLET BY MOUTH AT BEDTIME FOR NERVE PAIN 30 tablet 2  . doxycycline (VIBRA-TABS) 100 MG tablet TAKE 1 TABLET (100 MG TOTAL) BY MOUTH DAILY. 90 tablet 0  . furosemide (LASIX) 40 MG tablet TAKE 1 TABLET BY MOUTH ONCE DAILY 90 tablet 2  . meloxicam (MOBIC) 15 MG tablet Take 15 mg by mouth daily.     . methimazole (TAPAZOLE) 5 MG tablet Take 5 mg in a.m. 90 tablet 5  . methocarbamol (ROBAXIN) 500 MG tablet Take 500 mg by mouth as needed.     . metroNIDAZOLE (METROGEL) 1 % gel Apply topically daily. 45 g 5  . NEEDLE, DISP, 22 G (BD ECLIPSE NEEDLE) 22G X 1-1/2" MISC Use as directed 100 each 0  .  pantoprazole (PROTONIX) 40 MG tablet Take 1 tablet (40 mg total) by mouth daily. 90 tablet 1  . pregabalin (LYRICA) 200 MG capsule TAKE 1 CAPSULE BY MOUTH 2 TIMES DAILY. DO NOT TAKE GABAPENTIN WHILE TAKING THIS MEDICATION. 180 capsule 1  . sildenafil (REVATIO) 20 MG tablet Take 2-5 tablets prior to intercourse. 30 tablet 2  . Syringe, Disposable, 3 ML MISC Use as directed 100 each 0  . testosterone cypionate (DEPOTESTOSTERONE CYPIONATE) 200 MG/ML injection INJECT 1 ML INTO THE MUSCLE EVERY 14 DAYS 2 mL 5  . triamcinolone cream (KENALOG) 0.1 % Apply 1 application topically 2 (two) times daily. 60 g 2   No current facility-administered medications for this visit.   REVIEW OF SYSTEMS: Valu.Nieves ] denotes positive finding; [  ] denotes negative finding  CARDIOVASCULAR:  [ ]  chest pain   [ ]  dyspnea on exertion  [ ]  leg swelling  CONSTITUTIONAL:  [ ]  fever   [ ]  chills  PHYSICAL EXAM: Vitals:   11/15/19 1025  BP: 117/73  Pulse: 60  Resp: 16  Temp: 98.3 F (36.8 C)  TempSrc: Temporal  SpO2: 94%  Weight: 285 lb (129.3 kg)  Height: 6\' 2"  (1.88 m)   GENERAL: The patient is a well-nourished male, in no acute distress. The vital signs are documented above. CARDIOVASCULAR: There  is a regular rate and rhythm. PULMONARY: There is good air exchange bilaterally without wheezing or rales. VASCULAR: He has some mild bruising in the distal right thigh.  His incisions are healing nicely.  DATA:  VENOUS DUPLEX: I have independently interpreted his venous duplex scan today.  The right great saphenous vein is closed from 3 cm distal to the saphenofemoral junction to the knee.  There is no evidence of DVT.  MEDICAL ISSUES:  STATUS POST LASER ABLATION RIGHT GREAT SAPHENOUS VEIN WITH STAB PHLEBECTOMIES: The patient is doing well status post stab phlebectomies and laser ablation of both lower extremities.  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  He will continue to wear his compression stockings.  I encouraged him to avoid prolonged sitting and standing.  I will plan on seeing him back as needed.  Deitra Mayo [facility] [place] [phone]

## 2019-11-17 ENCOUNTER — Encounter: Payer: Self-pay | Admitting: Family Medicine

## 2019-11-17 ENCOUNTER — Inpatient Hospital Stay
Admission: RE | Admit: 2019-11-17 | Discharge: 2019-11-17 | Disposition: A | Discharge status: Home / Self Care | Payer: No Typology Code available for payment source | Source: Ambulatory Visit

## 2019-11-19 MED FILL — CYCLOBENZAPRINE HCL 10 MG T: 10 | 30 days supply | Qty: 30 | Fill #2

## 2019-11-26 MED FILL — ATORVASTATIN 20 MG TABLET: 20 | 90 days supply | Qty: 90 | Fill #1

## 2019-12-14 ENCOUNTER — Other Ambulatory Visit: Payer: Self-pay | Admitting: Family Medicine

## 2019-12-14 ENCOUNTER — Other Ambulatory Visit: Payer: Self-pay | Admitting: Internal Medicine

## 2019-12-14 DIAGNOSIS — E0500 Thyrotoxicosis with diffuse goiter without thyrotoxic crisis or storm: Secondary | ICD-10-CM

## 2019-12-14 DIAGNOSIS — N529 Male erectile dysfunction, unspecified: Secondary | ICD-10-CM

## 2019-12-14 MED FILL — BD 3 ML SYRINGE 18GX1-1/2: 18G X 1-1/2 | 140 days supply | Qty: 10 | Fill #4

## 2019-12-14 MED FILL — BD 3 ML SYRINGE WITH NEEDLE: 23G X 1-1/2 | 10 days supply | Qty: 10 | Fill #0

## 2019-12-14 MED FILL — PREGABALIN 200 MG CAPS: 200 | 90 days supply | Qty: 180 | Fill #1

## 2019-12-14 MED FILL — CYCLOBENZAPRINE HCL 10 MG T: 10 | 30 days supply | Qty: 30 | Fill #0

## 2019-12-14 MED FILL — methIMAzole 5 MG TABS: 5 | 90 days supply | Qty: 90 | Fill #0

## 2019-12-14 MED FILL — TESTOSTERONE CYP 200 MG/ML: 200 | 28 days supply | Qty: 2 | Fill #2

## 2019-12-14 MED FILL — BD 3 ML SYRINGE 18GX1-1/2": 18G X 1-1/2 | 140 days supply | Qty: 10 | Fill #4

## 2019-12-14 MED FILL — SILDENAFIL CITRATE 20 MG TA: 20 | 60 days supply | Qty: 30 | Fill #0

## 2019-12-14 NOTE — Telephone Encounter (Signed)
Refills per PCP. He was lost for f/u with me.

## 2019-12-14 NOTE — Telephone Encounter (Signed)
I refilled, but should get from Dr. Cruzita Lederer in future

## 2019-12-14 NOTE — Telephone Encounter (Signed)
Last office visit 09/19/2017  Cancel/No-show? 2  Future office visit scheduled? no  Please advise on refill.

## 2019-12-25 ENCOUNTER — Other Ambulatory Visit: Payer: Self-pay | Admitting: Family Medicine

## 2019-12-25 DIAGNOSIS — K219 Gastro-esophageal reflux disease without esophagitis: Secondary | ICD-10-CM

## 2019-12-25 MED FILL — DOXYCYCLINE HYCLATE 100 MG: 100 | 90 days supply | Qty: 90 | Fill #0

## 2019-12-25 MED FILL — PANTOPRAZOLE SOD DR 40 MG T: 40 | 90 days supply | Qty: 90 | Fill #0

## 2020-01-11 MED FILL — CYCLOBENZAPRINE HCL 10 MG T: 10 | 30 days supply | Qty: 30 | Fill #1

## 2020-01-23 ENCOUNTER — Other Ambulatory Visit: Payer: Self-pay | Admitting: Family Medicine

## 2020-01-23 DIAGNOSIS — I10 Essential (primary) hypertension: Secondary | ICD-10-CM

## 2020-01-23 MED FILL — ATENOLOL 100 MG TABLET: 100 | 90 days supply | Qty: 90 | Fill #0

## 2020-01-25 MED FILL — MELOXICAM 15 MG TABLET: 15 | 60 days supply | Qty: 60 | Fill #2

## 2020-02-04 MED FILL — TESTOSTERONE CYP 200 MG/ML: 200 | 28 days supply | Qty: 2 | Fill #3

## 2020-02-06 ENCOUNTER — Encounter: Payer: Self-pay | Admitting: Family Medicine

## 2020-02-06 MED FILL — SILDENAFIL CITRATE 20 MG TA: 20 | 60 days supply | Qty: 30 | Fill #1

## 2020-02-07 ENCOUNTER — Telehealth (INDEPENDENT_AMBULATORY_CARE_PROVIDER_SITE_OTHER): Payer: No Typology Code available for payment source | Admitting: Family Medicine

## 2020-02-07 ENCOUNTER — Encounter: Payer: Self-pay | Admitting: Family Medicine

## 2020-02-07 DIAGNOSIS — L03114 Cellulitis of left upper limb: Secondary | ICD-10-CM

## 2020-02-07 DIAGNOSIS — R234 Changes in skin texture: Secondary | ICD-10-CM

## 2020-02-07 DIAGNOSIS — L709 Acne, unspecified: Secondary | ICD-10-CM | POA: Diagnosis not present

## 2020-02-07 DIAGNOSIS — L989 Disorder of the skin and subcutaneous tissue, unspecified: Secondary | ICD-10-CM | POA: Diagnosis not present

## 2020-02-07 MED ORDER — CEPHALEXIN 500 MG PO CAPS: 500.0000 mg | ORAL_CAPSULE | Freq: Four times a day (QID) | ORAL | 0 refills | Status: DC

## 2020-02-07 MED FILL — CEPHALEXIN 500 MG CAPSULE: 500 | 5 days supply | Qty: 10 | Fill #0

## 2020-02-07 NOTE — Telephone Encounter (Signed)
I called the Jeffrey Price, informed him Dr Ethlyn Gallery is out of the office and scheduled a virtual appt for today at 10:20am.  Message sent to Dr Maudie Mercury.

## 2020-02-07 NOTE — Progress Notes (Signed)
Virtual Visit via Video Note  I connected with Greg  on 02/07/20 at 10:20 AM EDT by a video enabled telemedicine application and verified that I am speaking with the correct person using two identifiers.  Location patient: home, [place] Location provider:work or home office Persons participating in the virtual visit: patient, provider   I discussed the limitations of evaluation and management by telemedicine and the availability of in person appointments. The patient expressed understanding and agreed to proceed.   HPI:  Acute visit for wound on the hand: -started about 1 week ago from working in the yard -now with some pain and mild swelling around the wound on the L hand -denies fevers, malaise, purulent discharge -he also wonders about any options for oily acne prone skin, on doxy daily already for this   ROS: See pertinent positives and negatives per HPI.  Past Medical History:  Diagnosis Date  . Arthritis    in back  . Blood clot in vein 1995   bil legs  . Bulging lumbar disc   . Cataract    mild  . Cellulitis 2012   forehead  . DDD (degenerative disc disease), lumbar   . Graves disease   . Hx of adenomatous colonic polyps 12/15/2017  . Hyperlipidemia   . Hypertension   . Thyrotoxicosis without thyroid storm     Past Surgical History:  Procedure Laterality Date  . ENDOVENOUS ABLATION SAPHENOUS VEIN W/ LASER Left 10/18/2019   endovenous laser ablation left greater saphenous vein and stab phlebectomy > 20 incisions left leg by Gae Gallop MD   . ENDOVENOUS ABLATION SAPHENOUS VEIN W/ LASER Right 11/08/2019   endovenous laser ablation right greater saphenous vein and stab phlebectomy 10-20 incisions right leg by Gae Gallop MD   . LUMBAR EPIDURAL INJECTION      Family History  Problem Relation Age of Onset  . Thyroid disease Mother   . Parkinson's disease Father   . Colon cancer Neg Hx   . Rectal cancer Neg Hx   . Stomach cancer Neg Hx   . Esophageal cancer Neg  Hx     SOCIAL HX: see hpi   Current Outpatient Medications:  .  acetaminophen (TYLENOL) 325 MG tablet, Take 650 mg by mouth every 6 (six) hours as needed., Disp: , Rfl:  .  aspirin 81 MG chewable tablet, Chew 81 mg by mouth daily., Disp: , Rfl:  .  atenolol (TENORMIN) 100 MG tablet, TAKE 1 TABLET (100 MG TOTAL) BY MOUTH DAILY., Disp: 90 tablet, Rfl: 1 .  atorvastatin (LIPITOR) 20 MG tablet, Take 1 tablet (20 mg total) by mouth daily., Disp: 90 tablet, Rfl: 1 .  cyclobenzaprine (FLEXERIL) 10 MG tablet, TAKE 1 TABLET BY MOUTH AT BEDTIME FOR NERVE PAIN, Disp: 30 tablet, Rfl: 2 .  doxycycline (VIBRA-TABS) 100 MG tablet, TAKE 1 TABLET (100 MG TOTAL) BY MOUTH DAILY., Disp: 90 tablet, Rfl: 0 .  furosemide (LASIX) 40 MG tablet, TAKE 1 TABLET BY MOUTH ONCE DAILY, Disp: 90 tablet, Rfl: 2 .  meloxicam (MOBIC) 15 MG tablet, Take 15 mg by mouth daily. , Disp: , Rfl:  .  methimazole (TAPAZOLE) 5 MG tablet, TAKE 1 TABLET (5 MG) BY MOUTH DAILY IN THE MORNING, Disp: 90 tablet, Rfl: 0 .  methocarbamol (ROBAXIN) 500 MG tablet, Take 500 mg by mouth as needed. , Disp: , Rfl:  .  metroNIDAZOLE (METROGEL) 1 % gel, Apply topically daily., Disp: 45 g, Rfl: 5 .  NEEDLE, DISP, 22 G (BD  ECLIPSE NEEDLE) 22G X 1-1/2" MISC, Use as directed, Disp: 100 each, Rfl: 0 .  pantoprazole (PROTONIX) 40 MG tablet, TAKE 1 TABLET (40 MG TOTAL) BY MOUTH DAILY., Disp: 90 tablet, Rfl: 0 .  pregabalin (LYRICA) 200 MG capsule, TAKE 1 CAPSULE BY MOUTH 2 TIMES DAILY. DO NOT TAKE GABAPENTIN WHILE TAKING THIS MEDICATION., Disp: 180 capsule, Rfl: 1 .  sildenafil (REVATIO) 20 MG tablet, TAKE 2 TO 5 TABLETS BY MOUTH PRIOR TO INTERCOURSE, Disp: 30 tablet, Rfl: 2 .  Syringe, Disposable, 3 ML MISC, Use as directed, Disp: 100 each, Rfl: 0 .  testosterone cypionate (DEPOTESTOSTERONE CYPIONATE) 200 MG/ML injection, INJECT 1 ML INTO THE MUSCLE EVERY 14 DAYS, Disp: 2 mL, Rfl: 5 .  triamcinolone cream (KENALOG) 0.1 %, Apply 1 application topically 2 (two)  times daily., Disp: 60 g, Rfl: 2 .  cephALEXin (KEFLEX) 500 MG capsule, Take 1 capsule (500 mg total) by mouth 4 (four) times daily., Disp: 10 capsule, Rfl: 0  EXAM:  VITALS per patient if applicable:  GENERAL: alert, oriented, appears well and in no acute distress  HEENT: atraumatic, conjunttiva clear, no obvious abnormalities on inspection of external nose and ears  NECK: normal movements of the head and neck  LUNGS: on inspection no signs of respiratory distress, breathing rate appears normal, no obvious gross SOB, gasping or wheezing  CV: no obvious cyanosis  SKIN: appears to have healing lesion on dorsal aspect of L hand with several other scattered scabs on the hand and lower arm, question mild erythema/edema surrounding hand lesion. Skin on face appears pretty clear today.  MS: moves all visible extremities without noticeable abnormality  PSYCH/NEURO: pleasant and cooperative, no obvious depression or anxiety, speech and thought processing grossly intact  ASSESSMENT AND PLAN:  Discussed the following assessment and plan:  Skin lesion  Cellulitis of left upper extremity  Acne, unspecified acne type  Oily skin  -we discussed possible serious and likely etiologies, options for evaluation and workup, limitations of telemedicine visit vs in person visit, treatment, treatment risks and precautions. Pt prefers to treat via telemedicine empirically rather then risking or undertaking an in person visit at this moment. Opted to do a short course of keflex for strep coverage in case mild developing cellulitis as he feels the redness has just developed the last day. Aquaphor twice daily and keeping wound clean and dry.  Advised close monitoring and he agrees to seek prompt in person care if worsening, new symptoms arise, or if is not improving with treatment. Discussed options for oily skin and ance. He is going to try a topical benzyl peroxide wash.    I discussed the assessment and  treatment plan with the patient. The patient was provided an opportunity to ask questions and all were answered. The patient agreed with the plan and demonstrated an understanding of the instructions.   The patient was advised to call back or seek an in-person evaluation if the symptoms worsen or if the condition fails to improve as anticipated.   Lucretia Kern, DO

## 2020-02-13 MED FILL — CYCLOBENZAPRINE HCL 10 MG T: 10 | 30 days supply | Qty: 30 | Fill #2

## 2020-02-25 ENCOUNTER — Other Ambulatory Visit: Payer: Self-pay | Admitting: Family Medicine

## 2020-02-25 DIAGNOSIS — E785 Hyperlipidemia, unspecified: Secondary | ICD-10-CM

## 2020-02-25 MED FILL — ATORVASTATIN 20 MG TABLET: 20 | 90 days supply | Qty: 90 | Fill #0

## 2020-02-25 MED FILL — FUROSEMIDE 40 MG TAB: 40 | 90 days supply | Qty: 90 | Fill #1

## 2020-03-03 ENCOUNTER — Telehealth: Payer: Self-pay | Admitting: Family Medicine

## 2020-03-03 NOTE — Telephone Encounter (Signed)
Spoke with the pt and he stated he completed the 5-day course of Keflex that was given.  Patient stated his hand is still the same and I offered an appt tomorrow with another provider.  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

## 2020-03-03 NOTE — Telephone Encounter (Signed)
Please make sure that he is taking keflex Dr. Maudie Mercury prescribed? If worsening needs to be seen today.

## 2020-03-03 NOTE — Telephone Encounter (Signed)
Pt had a virtual visit on 02/07/20 with Dr. Maudie Mercury for blisters on his hand. He was told if it not get any better to call back. It has not gotten any better and pt would like to know what he should do.   Pt also feels as if he is due for lab work,if those orders could be called in as well.

## 2020-03-04 MED FILL — TESTOSTERONE CYP 200 MG/ML: 200 | 28 days supply | Qty: 2 | Fill #0

## 2020-03-05 ENCOUNTER — Other Ambulatory Visit: Payer: Self-pay | Admitting: Family Medicine

## 2020-03-05 ENCOUNTER — Encounter: Payer: Self-pay | Admitting: Family Medicine

## 2020-03-05 DIAGNOSIS — E1142 Type 2 diabetes mellitus with diabetic polyneuropathy: Secondary | ICD-10-CM

## 2020-03-05 DIAGNOSIS — E785 Hyperlipidemia, unspecified: Secondary | ICD-10-CM

## 2020-03-05 DIAGNOSIS — L309 Dermatitis, unspecified: Secondary | ICD-10-CM

## 2020-03-05 DIAGNOSIS — E0500 Thyrotoxicosis with diffuse goiter without thyrotoxic crisis or storm: Secondary | ICD-10-CM

## 2020-03-05 DIAGNOSIS — R7989 Other specified abnormal findings of blood chemistry: Secondary | ICD-10-CM

## 2020-03-05 DIAGNOSIS — I10 Essential (primary) hypertension: Secondary | ICD-10-CM

## 2020-03-05 MED ORDER — TRIAMCINOLONE ACETONIDE 0.1 % EX CREA: 1.0000 "application " | TOPICAL_CREAM | Freq: Two times a day (BID) | CUTANEOUS | 2 refills | Status: DC

## 2020-03-05 MED FILL — TRIAMCINOLONE ACETONIDE 0.1: 0.1 | 30 days supply | Qty: 60 | Fill #0

## 2020-03-05 NOTE — Telephone Encounter (Signed)
Labs have been ordered.   If he is able to send a pic of his hand through mychart I would be happy to look and see if there is something I think would be good to start prior to visit on Friday.

## 2020-03-05 NOTE — Telephone Encounter (Signed)
Spoke with the pt and informed him of the message below.  Patient stated he will send a message with pictures in a few hours. Message sent to PCP.

## 2020-03-05 NOTE — Telephone Encounter (Signed)
Noted  

## 2020-03-06 ENCOUNTER — Other Ambulatory Visit: Payer: Self-pay

## 2020-03-07 ENCOUNTER — Other Ambulatory Visit: Payer: Self-pay | Admitting: Family Medicine

## 2020-03-07 ENCOUNTER — Ambulatory Visit (INDEPENDENT_AMBULATORY_CARE_PROVIDER_SITE_OTHER): Payer: No Typology Code available for payment source | Admitting: Family Medicine

## 2020-03-07 ENCOUNTER — Encounter: Payer: Self-pay | Admitting: Family Medicine

## 2020-03-07 VITALS — BP 100/72 | HR 60 | Temp 98.1°F | Ht 74.0 in | Wt 286.0 lb

## 2020-03-07 DIAGNOSIS — L03114 Cellulitis of left upper limb: Secondary | ICD-10-CM

## 2020-03-07 DIAGNOSIS — I10 Essential (primary) hypertension: Secondary | ICD-10-CM | POA: Diagnosis not present

## 2020-03-07 DIAGNOSIS — R234 Changes in skin texture: Secondary | ICD-10-CM

## 2020-03-07 DIAGNOSIS — E1142 Type 2 diabetes mellitus with diabetic polyneuropathy: Secondary | ICD-10-CM | POA: Diagnosis not present

## 2020-03-07 DIAGNOSIS — E785 Hyperlipidemia, unspecified: Secondary | ICD-10-CM | POA: Diagnosis not present

## 2020-03-07 DIAGNOSIS — E0500 Thyrotoxicosis with diffuse goiter without thyrotoxic crisis or storm: Secondary | ICD-10-CM

## 2020-03-07 DIAGNOSIS — R7989 Other specified abnormal findings of blood chemistry: Secondary | ICD-10-CM | POA: Diagnosis not present

## 2020-03-07 DIAGNOSIS — L309 Dermatitis, unspecified: Secondary | ICD-10-CM

## 2020-03-07 LAB — COMPREHENSIVE METABOLIC PANEL
ALT: 24 U/L (ref 0–53)
AST: 24 U/L (ref 0–37)
Albumin: 4.1 g/dL (ref 3.5–5.2)
Alkaline Phosphatase: 100 U/L (ref 39–117)
BUN: 17 mg/dL (ref 6–23)
CO2: 30 mEq/L (ref 19–32)
Calcium: 8.9 mg/dL (ref 8.4–10.5)
Chloride: 98 mEq/L (ref 96–112)
Creatinine, Ser: 1.11 mg/dL (ref 0.40–1.50)
GFR: 66.84 mL/min (ref >60.00)
Glucose, Bld: 92 mg/dL (ref 70–99)
Potassium: 5.1 mEq/L (ref 3.5–5.1)
Sodium: 134 mEq/L — ABNORMAL LOW (ref 135–145)
Total Bilirubin: 0.7 mg/dL (ref 0.2–1.2)
Total Protein: 7.1 g/dL (ref 6.0–8.3)

## 2020-03-07 LAB — CBC WITH DIFFERENTIAL/PLATELET
Basophils Absolute: 0 10*3/uL (ref 0.0–0.1)
Basophils Relative: 0.5 % (ref 0.0–3.0)
Eosinophils Absolute: 0.2 10*3/uL (ref 0.0–0.7)
Eosinophils Relative: 2.4 % (ref 0.0–5.0)
HCT: 51.1 % (ref 39.0–52.0)
Hemoglobin: 16.6 g/dL (ref 13.0–17.0)
Lymphocytes Relative: 33.4 % (ref 12.0–46.0)
Lymphs Abs: 2.8 10*3/uL (ref 0.7–4.0)
MCHC: 32.6 g/dL (ref 30.0–36.0)
MCV: 86.9 fl (ref 78.0–100.0)
Monocytes Absolute: 0.8 10*3/uL (ref 0.1–1.0)
Monocytes Relative: 9.7 % (ref 3.0–12.0)
Neutro Abs: 4.6 10*3/uL (ref 1.4–7.7)
Neutrophils Relative %: 54 % (ref 43.0–77.0)
Platelets: 253 10*3/uL (ref 150.0–400.0)
RBC: 5.87 Mil/uL — ABNORMAL HIGH (ref 4.22–5.81)
RDW: 15.6 % — ABNORMAL HIGH (ref 11.5–15.5)
WBC: 8.5 10*3/uL (ref 4.0–10.5)

## 2020-03-07 LAB — PSA: PSA: 0.36 ng/mL (ref 0.10–4.00)

## 2020-03-07 LAB — LIPID PANEL
Cholesterol: 111 mg/dL (ref 0–200)
HDL: 26.3 mg/dL — ABNORMAL LOW (ref >39.00)
LDL Cholesterol: 53 mg/dL (ref 0–99)
NonHDL: 85.07
Total CHOL/HDL Ratio: 4
Triglycerides: 160 mg/dL — ABNORMAL HIGH (ref 0.0–149.0)
VLDL: 32 mg/dL (ref 0.0–40.0)

## 2020-03-07 LAB — HEMOGLOBIN A1C: Hgb A1c MFr Bld: 6.3 % (ref 4.6–6.5)

## 2020-03-07 LAB — TESTOSTERONE: Testosterone: 849.22 ng/dL (ref 300.00–890.00)

## 2020-03-07 MED ORDER — TESTOSTERONE CYPIONATE 200 MG/ML IM SOLN: INTRAMUSCULAR | 5 refills | Status: DC

## 2020-03-07 MED ORDER — CYCLOBENZAPRINE HCL 10 MG PO TABS: 10.0000 mg | ORAL_TABLET | Freq: Three times a day (TID) | ORAL | 3 refills | Status: DC | PRN

## 2020-03-07 MED FILL — CYCLOBENZAPRINE HCL 10 MG T: 10 | 30 days supply | Qty: 90 | Fill #0

## 2020-03-07 NOTE — Progress Notes (Signed)
Jeffrey Price DOB: 1956-02-05 Encounter date: 03/07/2020  This is a 64 y.o. male who presents with Chief Complaint  Patient presents with  . Hand Pain    patient presents with recurrent left hand swelling and redness, stated he had something similar in his scalp 10 years ago    History of present illness: Has had redness up forearm for 3-4 months. Now noting redness radiating down on hand.   Started with blister over knuckles with driving at least 4-5 weeks ago; (was not doing yard work). Feels hot - more irritated in sunlight with driving. Swollen. Worse at end of day. Still getting new blisters.   Doesn't feel that keflex made skin better (didn't get worse); he took this as prescribed x 5 days along with his daily doxycycline.  Steroid cream makes it feel a little better, but not improving it. Areas of scab are where he has hit hand on something.   Just very oily with skin. Has always had oil but this is worse. Washes and within hours feels that he is completely covered in oil. Has had acne most of life; right now acne is controlled, but oil is problem. It leaks on clothing/sheets.    No Known Allergies Current Meds  Medication Sig  . acetaminophen (TYLENOL) 325 MG tablet Take 650 mg by mouth every 6 (six) hours as needed.  Marland Kitchen aspirin 81 MG chewable tablet Chew 81 mg by mouth daily.  Marland Kitchen atenolol (TENORMIN) 100 MG tablet TAKE 1 TABLET (100 MG TOTAL) BY MOUTH DAILY.  Marland Kitchen atorvastatin (LIPITOR) 20 MG tablet TAKE 1 TABLET BY MOUTH ONCE DAILY  . cyclobenzaprine (FLEXERIL) 10 MG tablet Take 1 tablet (10 mg total) by mouth 3 (three) times daily as needed for muscle spasms.  Marland Kitchen doxycycline (VIBRA-TABS) 100 MG tablet TAKE 1 TABLET (100 MG TOTAL) BY MOUTH DAILY.  . furosemide (LASIX) 40 MG tablet TAKE 1 TABLET BY MOUTH ONCE DAILY  . meloxicam (MOBIC) 15 MG tablet Take 15 mg by mouth daily.   . methimazole (TAPAZOLE) 5 MG tablet TAKE 1 TABLET (5 MG) BY MOUTH DAILY IN THE MORNING  .  methocarbamol (ROBAXIN) 500 MG tablet Take 500 mg by mouth as needed.   . metroNIDAZOLE (METROGEL) 1 % gel Apply topically daily.  Marland Kitchen NEEDLE, DISP, 22 G (BD ECLIPSE NEEDLE) 22G X 1-1/2" MISC Use as directed  . pantoprazole (PROTONIX) 40 MG tablet TAKE 1 TABLET (40 MG TOTAL) BY MOUTH DAILY.  Marland Kitchen pregabalin (LYRICA) 200 MG capsule TAKE 1 CAPSULE BY MOUTH 2 TIMES DAILY. DO NOT TAKE GABAPENTIN WHILE TAKING THIS MEDICATION.  . sildenafil (REVATIO) 20 MG tablet TAKE 2 TO 5 TABLETS BY MOUTH PRIOR TO INTERCOURSE  . Syringe, Disposable, 3 ML MISC Use as directed  . testosterone cypionate (DEPOTESTOSTERONE CYPIONATE) 200 MG/ML injection INJECT 1 ML INTO THE MUSCLE EVERY 14 DAYS  . triamcinolone cream (KENALOG) 0.1 % Apply 1 application topically 2 (two) times daily.  . [DISCONTINUED] cephALEXin (KEFLEX) 500 MG capsule Take 1 capsule (500 mg total) by mouth 4 (four) times daily.  . [DISCONTINUED] cyclobenzaprine (FLEXERIL) 10 MG tablet TAKE 1 TABLET BY MOUTH AT BEDTIME FOR NERVE PAIN  . [DISCONTINUED] testosterone cypionate (DEPOTESTOSTERONE CYPIONATE) 200 MG/ML injection INJECT 1 ML INTO THE MUSCLE EVERY 14 DAYS    Review of Systems  Constitutional: Negative for chills, fatigue and fever.  Respiratory: Negative for cough, chest tightness, shortness of breath and wheezing.   Cardiovascular: Negative for chest pain, palpitations and leg swelling.  Skin: Positive for color change and rash.    Objective:  BP 100/72 (BP Location: Right Arm, Patient Position: Sitting, Cuff Size: Large)   Pulse 60   Temp 98.1 F (36.7 C) (Temporal)   Ht 6\' 2"  (1.88 m)   Wt 286 lb (129.7 kg)   BMI 36.72 kg/m   Weight: 286 lb (129.7 kg)   BP Readings from Last 3 Encounters:  03/07/20 100/72  11/15/19 117/73  11/08/19 120/77   Wt Readings from Last 3 Encounters:  03/07/20 286 lb (129.7 kg)  11/15/19 285 lb (129.3 kg)  11/08/19 284 lb (128.8 kg)    Physical Exam Constitutional:      General: He is not in acute  distress.    Appearance: He is well-developed.  Pulmonary:     Effort: Pulmonary effort is normal. No respiratory distress.     Breath sounds: No rales.  Lymphadenopathy:     Cervical:     Left cervical: No superficial, deep or posterior cervical adenopathy.     Upper Body:     Left upper body: No supraclavicular, axillary or epitrochlear adenopathy.  Skin:    Comments: There is erythema that is not diffuse and extends forearm to mid knuckle on dorsum of left hand. There is no extension to finger webbing or ventral hand. There are two 0.5cm blisters one on thumb, one on knuckle. There are 0.5cm scabs where previous blisters have ruptured. Skin is arm and there is slight edema, but this is not well demarcated.  Neurological:     Mental Status: He is alert and oriented to person, place, and time.  Psychiatric:        Behavior: Behavior normal.     Assessment/Plan  1. Dermatitis Hand was examined along with Dr. Elease Hashimoto in office today. He did not have improvement on doxy/keflex combination and erythema has been present for 5+ weeks at this point. We were in agreement that appearance may be more related to sensitivity reaction and have thus decided to initially treat with continuing steroid topical and keeping skin covered. I am not certain if this arm exposure/issue is related to him driving and sun exposure in addition to doxycycline? He will update me next week/sooner if any worsening.   2. Low testosterone Larger vial sent for him today. - testosterone cypionate (DEPOTESTOSTERONE CYPIONATE) 200 MG/ML injection; INJECT 1 ML INTO THE MUSCLE EVERY 14 DAYS  Dispense: 10 mL; Refill: 5 - PSA - Testosterone  3. Oily skin We discussed that testosterone is likely contributing to this condition but due to severity I think reasonable to see dermatology for further eval/treatment. Uncertain if they would consider but he would respond well to accutane for oil suppression and for healing from  previous acne. - Ambulatory referral to Dermatology  (diagnoses below are associated with bloodwork order which was previously entered but that patient is completing today) 4. Graves disease - Thyroid Panel With TSH  5. Hyperlipidemia, unspecified hyperlipidemia type - Lipid panel  6. Controlled type 2 diabetes mellitus with diabetic polyneuropathy, without long-term current use of insulin (HCC) - Hemoglobin A1c  7. Essential hypertension - Comprehensive metabolic panel - CBC with Differential/Platelet   Return if symptoms worsen or fail to improve, for and pending bloodwork.    Micheline Rough, MD

## 2020-03-08 LAB — THYROID PANEL WITH TSH
Free Thyroxine Index: 1.7 (ref 1.4–3.8)
T3 Uptake: 32 % (ref 22–35)
T4, Total: 5.2 ug/dL (ref 4.9–10.5)
TSH: 4.25 mIU/L (ref 0.40–4.50)

## 2020-03-11 ENCOUNTER — Other Ambulatory Visit: Payer: Self-pay | Admitting: Family Medicine

## 2020-03-11 DIAGNOSIS — G629 Polyneuropathy, unspecified: Secondary | ICD-10-CM

## 2020-03-12 MED FILL — PREGABALIN 200 MG CAPS: 200 | 90 days supply | Qty: 180 | Fill #0

## 2020-03-14 ENCOUNTER — Other Ambulatory Visit: Payer: Self-pay | Admitting: Family Medicine

## 2020-03-14 MED ORDER — SULFAMETHOXAZOLE-TRIMETHOPRIM 800-160 MG PO TABS: 1.0000 | ORAL_TABLET | Freq: Two times a day (BID) | ORAL | 0 refills | Status: DC

## 2020-03-14 MED FILL — SULFAMETHOXAZOLE-TMP DS TAB: 800-160 | 5 days supply | Qty: 10 | Fill #0

## 2020-03-21 ENCOUNTER — Other Ambulatory Visit: Payer: Self-pay | Admitting: Family Medicine

## 2020-03-25 ENCOUNTER — Other Ambulatory Visit: Payer: Self-pay | Admitting: Orthopedic Surgery

## 2020-03-25 ENCOUNTER — Telehealth: Payer: Self-pay | Admitting: *Deleted

## 2020-03-25 NOTE — Telephone Encounter (Signed)
Call placed to pt re: surgical clearance and the need for an appt before surgery can be given. Pt has been scheduled to see Dr. Johnsie Cancel 05/16/2020 11:00 with understanding to arrive 10:45 for registration.  Will route back to the requesting surgeon's office to make them aware.

## 2020-03-25 NOTE — Telephone Encounter (Signed)
    [facility] Pre-operative Risk Assessment    HEARTCARE STAFF: - Please ensure there is not already an duplicate clearance open for this procedure - Under Visit Info/Reason for Call, type in Other and utilize the format Clearance MM/DD/YY or Clearance TBD  Request for surgical clearance:  1. What type of surgery is being performed?  LUMBER 2-5 DECOMPRESSION / LEFT LUMBAR 3-4 TRANSFORMINAL LUMBAR INTERBODY FUSION WITH INSTRUMENTATION AND ALLGRAFT   2. When is this surgery scheduled?  06/11/20   3. What type of clearance is required (medical clearance vs. Pharmacy clearance to hold med vs. Both)?  BOTH   4. Are there any medications that need to be held prior to surgery and how long? ASPIRIN   5. Practice name and name of physician performing surgery?  [facility] / DR. DUMONSKI   6. What is the office phone number?  [phone]   7.   What is the office fax number?  [phone]  8.   Anesthesia type (None, local, MAC, general) ?     Jeanann Lewandowsky 03/25/2020, 9:04 AM  _________________________________________________________________   (provider comments below)

## 2020-03-25 NOTE — Telephone Encounter (Signed)
   Primary Cardiologist:Peter Johnsie Cancel, MD  Chart reviewed as part of pre-operative protocol coverage. Because of Jeffrey Price's past medical history and time since last visit, he/she will require a follow-up visit in order to better assess preoperative cardiovascular risk.  Patient will need appointment end of June or early July for surgical clearance,   Pre-op covering staff: - Please schedule appointment and call patient to inform them. - Please contact requesting surgeon's office via preferred method (i.e, phone, fax) to inform them of need for appointment prior to surgery.  Leanor Kail, PA  03/25/2020, 12:45 PM

## 2020-03-27 ENCOUNTER — Other Ambulatory Visit: Payer: Self-pay | Admitting: Family Medicine

## 2020-03-27 DIAGNOSIS — E0500 Thyrotoxicosis with diffuse goiter without thyrotoxic crisis or storm: Secondary | ICD-10-CM

## 2020-03-27 DIAGNOSIS — K219 Gastro-esophageal reflux disease without esophagitis: Secondary | ICD-10-CM

## 2020-03-27 MED FILL — PANTOPRAZOLE SOD DR 40 MG T: 40 | 90 days supply | Qty: 90 | Fill #0

## 2020-03-27 MED FILL — methIMAzole 5 MG TABS: 5 | 90 days supply | Qty: 90 | Fill #0

## 2020-04-07 ENCOUNTER — Encounter: Payer: Self-pay | Admitting: Family Medicine

## 2020-04-09 ENCOUNTER — Other Ambulatory Visit: Payer: Self-pay | Admitting: Family Medicine

## 2020-04-09 MED FILL — METHOCARBAMOL 500 MG TABS: 500 | 15 days supply | Qty: 60 | Fill #1

## 2020-04-09 MED FILL — SILDENAFIL CITRATE 20 MG TA: 20 | 60 days supply | Qty: 30 | Fill #2

## 2020-04-10 ENCOUNTER — Other Ambulatory Visit: Payer: Self-pay | Admitting: Family Medicine

## 2020-04-10 MED FILL — MELOXICAM 15 MG TABLET: 15 | 60 days supply | Qty: 60 | Fill #0

## 2020-04-11 MED FILL — ATENOLOL 100 MG TABLET: 100 | 90 days supply | Qty: 90 | Fill #1

## 2020-05-03 MED FILL — FUROSEMIDE 40 MG TAB: 40 | 90 days supply | Qty: 90 | Fill #2

## 2020-05-12 MED FILL — TESTOSTERONE CYP 200 MG/ML: 200 | 84 days supply | Qty: 6 | Fill #0

## 2020-05-13 ENCOUNTER — Other Ambulatory Visit: Payer: Self-pay | Admitting: Family Medicine

## 2020-05-13 DIAGNOSIS — N529 Male erectile dysfunction, unspecified: Secondary | ICD-10-CM

## 2020-05-13 MED FILL — CYCLOBENZAPRINE HCL 10 MG T: 10 | 30 days supply | Qty: 90 | Fill #1

## 2020-05-13 NOTE — Progress Notes (Signed)
Cardiology Office Note   Date:  05/16/2020   ID:  Jeffrey Price, DOB 1956/08/25, MRN [id number]  PCP:  Caren Macadam, MD  Cardiologist:   Jenkins Rouge, MD   No chief complaint on file.    History of Present Illness: Jeffrey Price is a 64 y.o. male first seen 12/04/18  regarding edema and HTN.  Referred by Dr Carlota Raspberry.  Long standing HTN. Been on diuretic and beta blockers. He has thyroid disease and HLD as well Activity Limited by back pain. And arthritis  Uses lyrica for neuropathy   Labs fine Cr .90  LDL 49 TSH 2.5   Drives a truck.  Has bad neuropathy and back pain that limits activity Has gained 50 lbs since being Rx for hyperthyroidism Previous history of DVT in both LE;s Had laser RX by [facility] December 2020 both legs   Widowed 5 years ago wife had lung cancer. Has long time girlfriend One daughter and some step children His step son is Carlean Purl who played ball with my son Olevia Perches  Echo reviewed from 12/11/18 EF normal no pulmonary HTN AV sclerosis  Duplex with no active DVT and significant venous reflux on both legs   Recent labs with A1c 6.7 Testosterone low and starting shots LDL 77 Normal thyroid and kidneys with K 4.5   Doing well no active cardiac issues but lot';s of medical issues   Needs lumbar laminectomy with Dr Lynann Bologna scheduled for 06/11/20   Despite back pain he is active and can walk a mile with no cardiac issues   Past Medical History:  Diagnosis Date  . Arthritis    in back  . Blood clot in vein 1995   bil legs  . Bulging lumbar disc   . Cataract    mild  . Cellulitis 2012   forehead  . DDD (degenerative disc disease), lumbar   . Graves disease   . Hx of adenomatous colonic polyps 12/15/2017  . Hyperlipidemia   . Hypertension   . Thyrotoxicosis without thyroid storm     Past Surgical History:  Procedure Laterality Date  . ENDOVENOUS ABLATION SAPHENOUS VEIN W/ LASER Left 10/18/2019   endovenous laser ablation left greater saphenous vein  and stab phlebectomy > 20 incisions left leg by Gae Gallop MD   . ENDOVENOUS ABLATION SAPHENOUS VEIN W/ LASER Right 11/08/2019   endovenous laser ablation right greater saphenous vein and stab phlebectomy 10-20 incisions right leg by Gae Gallop MD   . LUMBAR EPIDURAL INJECTION       Current Outpatient Medications  Medication Sig Dispense Refill  . acetaminophen (TYLENOL) 325 MG tablet Take 650 mg by mouth every 6 (six) hours as needed.    Marland Kitchen aspirin 81 MG chewable tablet Chew 81 mg by mouth daily.    Marland Kitchen atenolol (TENORMIN) 100 MG tablet TAKE 1 TABLET (100 MG TOTAL) BY MOUTH DAILY. 90 tablet 1  . atorvastatin (LIPITOR) 20 MG tablet TAKE 1 TABLET BY MOUTH ONCE DAILY 90 tablet 0  . cyclobenzaprine (FLEXERIL) 10 MG tablet Take 1 tablet (10 mg total) by mouth 3 (three) times daily as needed for muscle spasms. 90 tablet 3  . doxycycline (VIBRA-TABS) 100 MG tablet TAKE 1 TABLET BY MOUTH ONCE DAILY (NEED LAB APPT) 90 tablet 0  . furosemide (LASIX) 40 MG tablet TAKE 1 TABLET BY MOUTH ONCE DAILY 90 tablet 2  . meloxicam (MOBIC) 15 MG tablet TAKE 1 TABLET BY MOUTH ONCE A DAY AS NEEDED INFLAMMATION  60 tablet 2  . methimazole (TAPAZOLE) 5 MG tablet TAKE 1 TABLET (5 MG) BY MOUTH DAILY IN THE MORNING 90 tablet 0  . methocarbamol (ROBAXIN) 500 MG tablet Take 500 mg by mouth as needed.     . metroNIDAZOLE (METROGEL) 1 % gel Apply topically daily. 45 g 5  . NEEDLE, DISP, 22 G (BD ECLIPSE NEEDLE) 22G X 1-1/2" MISC Use as directed 100 each 0  . pantoprazole (PROTONIX) 40 MG tablet TAKE 1 TABLET BY MOUTH ONCE DAILY (NEED LAB APPT) 90 tablet 0  . pregabalin (LYRICA) 200 MG capsule TAKE 1 CAPSULE BY MOUTH TWICE DAILY. DO NOT TAKE GABAPENTIN WHILE TAKING THIS MEDICATION 180 capsule 1  . sildenafil (REVATIO) 20 MG tablet TAKE 2 TO 5 TABLETS BY MOUTH PRIOR TO INTERCOURSE 30 tablet 2  . Syringe, Disposable, 3 ML MISC Use as directed 100 each 0  . testosterone cypionate (DEPOTESTOSTERONE CYPIONATE) 200 MG/ML  injection INJECT 1 ML INTO THE MUSCLE EVERY 14 DAYS 10 mL 5  . triamcinolone cream (KENALOG) 0.1 % Apply 1 application topically 2 (two) times daily. 60 g 2   No current facility-administered medications for this visit.    Allergies:   Patient has no known allergies.    Social History:  The patient  reports that he quit smoking about 8 years ago. He has never used smokeless tobacco. He reports current alcohol use of about 8.0 - 10.0 standard drinks of alcohol per week. He reports that he does not use drugs.   Family History:  The patient's family history includes Parkinson's disease in his father; Thyroid disease in his mother.    ROS:  Please see the history of present illness.   Otherwise, review of systems are positive for none.   All other systems are reviewed and negative.    PHYSICAL EXAM: VS:  BP 116/78   Pulse (!) 54   Ht 6\' 2"  (1.88 m)   Wt 281 lb (127.5 kg)   SpO2 97%   BMI 36.08 kg/m  , BMI Body mass index is 36.08 kg/m. Affect appropriate Healthy:  appears stated age 78: normal Neck supple with no adenopathy JVP normal no bruits no thyromegaly Lungs clear with no wheezing and good diaphragmatic motion Heart:  S1/S2 no murmur, no rub, gallop or click PMI normal Abdomen: benighn, BS positve, no tenderness, no AAA no bruit.  No HSM or HJR Distal pulses intact with no bruits Bilateral varicosities and edema Neuro non-focal Skin warm and dry No muscular weakness   EKG:  NSR rate 55 non specific ST T wave changes 05/16/20 NSR rate 54 normal    Recent Labs: 03/07/2020: ALT 24; BUN 17; Creatinine, Ser 1.11; Hemoglobin 16.6; Platelets 253.0; Potassium 5.1; Sodium 134; TSH 4.25    Lipid Panel    Component Value Date/Time   CHOL 111 03/07/2020 1215   CHOL 151 11/29/2018 1715   TRIG 160.0 (H) 03/07/2020 1215   HDL 26.30 (L) 03/07/2020 1215   HDL 33 (L) 11/29/2018 1715   CHOLHDL 4 03/07/2020 1215   VLDL 32.0 03/07/2020 1215   LDLCALC 53 03/07/2020 1215    LDLCALC 49 11/29/2018 1715      Wt Readings from Last 3 Encounters:  05/16/20 281 lb (127.5 kg)  03/07/20 286 lb (129.7 kg)  11/15/19 285 lb (129.3 kg)      Other studies Reviewed: Additional studies/ records that were reviewed today include: notes from primary labs .    ASSESSMENT AND PLAN:  1.  HTN:  Well controlled.  Continue current medications and low sodium Dash type diet.   2. Edema:  This is from obesity and varicosities with history of LE DVT' s Placed back on lasix in January Seen by Dr Yetta Barre now post endovenous laser ablation and stab phlebectomies 10/18/19 and 11/08/19 3. HLD:  Continue statin labs with primary 4. Thyroid:  Normal TSH continue Tapazole f/u endocrine  5 Neuropathy:  Continue lyrica needs f/u neuro as it is not very effective 6. Low T:  Started back on shots helped in past   7. Graves:  TSH normal but still on Tapazole continue atenolol f/u Dr Renne Crigler  8. Preoperative :  Clear to have lumbar laminectomy no active cardiac issues   Current medicines are reviewed at length with the patient today.  The patient does not have concerns regarding medicines.   The following changes have been made: None   Labs/ tests ordered today include: None   No orders of the defined types were placed in this encounter.    Disposition:   FU with cardiology in a year     Signed, Jenkins Rouge, MD  05/16/2020 11:06 AM    [address] [facility], [place], [place]  [address] Phone: [phone]; Fax: [phone]

## 2020-05-14 ENCOUNTER — Other Ambulatory Visit: Payer: Self-pay | Admitting: Family Medicine

## 2020-05-14 MED FILL — DOXYCYCLINE HYCLATE 100 MG: 100 | 90 days supply | Qty: 90 | Fill #0

## 2020-05-16 ENCOUNTER — Ambulatory Visit: Payer: No Typology Code available for payment source | Admitting: Cardiovascular Disease

## 2020-05-16 ENCOUNTER — Encounter: Payer: Self-pay | Admitting: Cardiovascular Disease

## 2020-05-16 ENCOUNTER — Other Ambulatory Visit: Payer: Self-pay

## 2020-05-16 VITALS — BP 116/78 | HR 54 | Ht 74.0 in | Wt 281.0 lb

## 2020-05-16 DIAGNOSIS — I10 Essential (primary) hypertension: Secondary | ICD-10-CM

## 2020-05-16 DIAGNOSIS — E785 Hyperlipidemia, unspecified: Secondary | ICD-10-CM

## 2020-05-16 NOTE — Patient Instructions (Addendum)

## 2020-05-24 MED FILL — SILDENAFIL CITRATE 20 MG TA: 20 | 60 days supply | Qty: 30 | Fill #0

## 2020-05-28 ENCOUNTER — Other Ambulatory Visit: Payer: Self-pay | Admitting: Family Medicine

## 2020-05-28 DIAGNOSIS — E785 Hyperlipidemia, unspecified: Secondary | ICD-10-CM

## 2020-05-28 MED FILL — ATORVASTATIN 20 MG TABLET: 20 | 90 days supply | Qty: 90 | Fill #0

## 2020-06-05 NOTE — Pre-Procedure Instructions (Addendum)
[place], [place] [facility] [address] [place] [address] Phone: [phone] Fax: [phone]     Your procedure is scheduled on Wednesday, August 4, from 08:30 AM- 12:12 PM.  Report to [facility] Entrance "A" at 05:30 A.M., and check in at the Admitting office.  Call this number if you have problems the morning of surgery:  [phone]  Call [phone] if you have any questions prior to your surgery date Monday-Friday 8am-4pm.    Remember:  Do not eat after midnight the night before your surgery.  You may drink clear liquids until 05:30 AM the morning of your surgery.   Clear liquids allowed are: Water, Non-Citrus Juices (without pulp), Carbonated Beverages, Clear Tea, Black Coffee Only, and Gatorade.  Enhanced Recovery after Surgery for Orthopedics Enhanced Recovery after Surgery is a protocol used to improve the stress on your body and your recovery after surgery.  Patient Instructions  . The night before surgery:  o No food after midnight. ONLY clear liquids after midnight  .  Marland Kitchen The day of surgery (if you do NOT have diabetes):  o Drink ONE (1) Pre-Surgery Clear Ensure by 05:30 AM the morning of surgery.   o This drink was given to you during your hospital  pre-op appointment visit. o Nothing else to drink after completing the  Pre-Surgery Clear Ensure.         If you have questions, please contact your surgeon's office.     Take these medicines the morning of surgery with A SIP OF WATER: atenolol (TENORMIN) atorvastatin (LIPITOR) methimazole (TAPAZOLE) pantoprazole (PROTONIX) pregabalin (LYRICA)  IF NEEDED: acetaminophen (TYLENOL)  cyclobenzaprine (FLEXERIL) methocarbamol (ROBAXIN)  Follow your surgeon's instructions on when to stop Aspirin.  If no instructions were given by your surgeon then you will need to call the office to get those instructions.    As of today, STOP taking any  meloxicam (MOBIC), Aleve, Naproxen, Ibuprofen, Motrin, Advil, Goody's, BC's, all herbal medications, fish oil, and all vitamins.      The Morning of Surgery:                Do not wear jewelry.            Do not wear lotions, powders, colognes, or deodorant.            Men may shave face and neck.            Do not bring valuables to the hospital.            [facility] is not responsible for any belongings or valuables.  Do NOT Smoke (Tobacco/Vaping) or drink Alcohol 24 hours prior to your procedure. If you use a CPAP at night, you may bring all equipment for your overnight stay.   Contacts, glasses, dentures or bridgework may not be worn into surgery.      For patients admitted to the hospital, discharge time will be determined by your treatment team.   Patients discharged the day of surgery will not be allowed to drive home, and someone needs to stay with them for 24 hours.    Special instructions:   [place]- Preparing For Surgery  Before surgery, you can play an important role. Because skin is not sterile, your skin needs to be as free of germs as possible. You can reduce the number of germs on your skin by washing with CHG (chlorahexidine gluconate) Soap before surgery.  CHG  is an antiseptic cleaner which kills germs and bonds with the skin to continue killing germs even after washing.    Oral Hygiene is also important to reduce your risk of infection.  Remember - BRUSH YOUR TEETH THE MORNING OF SURGERY WITH YOUR REGULAR TOOTHPASTE  Please do not use if you have an allergy to CHG or antibacterial soaps. If your skin becomes reddened/irritated stop using the CHG.  Do not shave (including legs and underarms) for at least 48 hours prior to first CHG shower. It is OK to shave your face.  Please follow these instructions carefully.   1. Shower the NIGHT BEFORE SURGERY and the MORNING OF SURGERY with CHG Soap.   2. If you chose to wash your hair, wash your hair first as usual  with your normal shampoo.  3. After you shampoo, rinse your hair and body thoroughly to remove the shampoo.  4. Use CHG as you would any other liquid soap. You can apply CHG directly to the skin and wash gently with a scrungie or a clean washcloth.   5. Apply the CHG Soap to your body ONLY FROM THE NECK DOWN.  Do not use on open wounds or open sores. Avoid contact with your eyes, ears, mouth and genitals (private parts). Wash Face and genitals (private parts)  with your normal soap.   6. Wash thoroughly, paying special attention to the area where your surgery will be performed.  7. Thoroughly rinse your body with warm water from the neck down.  8. DO NOT shower/wash with your normal soap after using and rinsing off the CHG Soap.  9. Pat yourself dry with a CLEAN TOWEL.  10. Wear CLEAN PAJAMAS to bed the night before surgery  11. Place CLEAN SHEETS on your bed the night of your first shower and DO NOT SLEEP WITH PETS.   Day of Surgery: Wear Clean/Comfortable clothing the morning of surgery. Do not apply any deodorants/lotions.   Remember to brush your teeth WITH YOUR REGULAR TOOTHPASTE.   Please read over the following fact sheets that you were given.

## 2020-06-06 ENCOUNTER — Other Ambulatory Visit: Payer: Self-pay

## 2020-06-06 ENCOUNTER — Encounter (HOSPITAL_COMMUNITY): Payer: Self-pay

## 2020-06-06 ENCOUNTER — Encounter (HOSPITAL_COMMUNITY)
Admission: RE | Admit: 2020-06-06 | Discharge: 2020-06-06 | Disposition: A | Discharge status: Home / Self Care | Payer: No Typology Code available for payment source | Source: Ambulatory Visit | Attending: Orthopedic Surgery | Admitting: Orthopedic Surgery

## 2020-06-06 DIAGNOSIS — Z79899 Other long term (current) drug therapy: Secondary | ICD-10-CM | POA: Insufficient documentation

## 2020-06-06 DIAGNOSIS — E039 Hypothyroidism, unspecified: Secondary | ICD-10-CM | POA: Diagnosis not present

## 2020-06-06 DIAGNOSIS — Z86718 Personal history of other venous thrombosis and embolism: Secondary | ICD-10-CM | POA: Insufficient documentation

## 2020-06-06 DIAGNOSIS — I10 Essential (primary) hypertension: Secondary | ICD-10-CM | POA: Diagnosis not present

## 2020-06-06 DIAGNOSIS — E118 Type 2 diabetes mellitus with unspecified complications: Secondary | ICD-10-CM | POA: Diagnosis not present

## 2020-06-06 DIAGNOSIS — Z01812 Encounter for preprocedural laboratory examination: Secondary | ICD-10-CM | POA: Insufficient documentation

## 2020-06-06 HISTORY — DX: Gastro-esophageal reflux disease without esophagitis: K21.9

## 2020-06-06 HISTORY — DX: Asymptomatic varicose veins of bilateral lower extremities: I83.93

## 2020-06-06 HISTORY — DX: Prediabetes: R73.03

## 2020-06-06 HISTORY — DX: Myoneural disorder, unspecified: G70.9

## 2020-06-06 LAB — CBC WITH DIFFERENTIAL/PLATELET
Abs Immature Granulocytes: 0.02 10*3/uL (ref 0.00–0.07)
Basophils Absolute: 0.1 10*3/uL (ref 0.0–0.1)
Basophils Relative: 1 %
Eosinophils Absolute: 0.2 10*3/uL (ref 0.0–0.5)
Eosinophils Relative: 2 %
HCT: 48.7 % (ref 39.0–52.0)
Hemoglobin: 15.7 g/dL (ref 13.0–17.0)
Immature Granulocytes: 0 %
Lymphocytes Relative: 33 %
Lymphs Abs: 2.4 10*3/uL (ref 0.7–4.0)
MCH: 27.8 pg (ref 26.0–34.0)
MCHC: 32.2 g/dL (ref 30.0–36.0)
MCV: 86.3 fL (ref 80.0–100.0)
Monocytes Absolute: 0.6 10*3/uL (ref 0.1–1.0)
Monocytes Relative: 8 %
Neutro Abs: 4 10*3/uL (ref 1.7–7.7)
Neutrophils Relative %: 56 %
Platelets: 220 10*3/uL (ref 150–400)
RBC: 5.64 MIL/uL (ref 4.22–5.81)
RDW: 14.6 % (ref 11.5–15.5)
WBC: 7.3 10*3/uL (ref 4.0–10.5)
nRBC: 0 % (ref 0.0–0.2)

## 2020-06-06 LAB — COMPREHENSIVE METABOLIC PANEL
ALT: 29 U/L (ref 0–44)
AST: 24 U/L (ref 15–41)
Albumin: 3.7 g/dL (ref 3.5–5.0)
Alkaline Phosphatase: 90 U/L (ref 38–126)
Anion gap: 6 (ref 5–15)
BUN: 13 mg/dL (ref 8–23)
CO2: 29 mmol/L (ref 22–32)
Calcium: 8.8 mg/dL — ABNORMAL LOW (ref 8.9–10.3)
Chloride: 101 mmol/L (ref 98–111)
Creatinine, Ser: 0.92 mg/dL (ref 0.61–1.24)
GFR calc Af Amer: >60 mL/min (ref >60)
GFR calc non Af Amer: >60 mL/min (ref >60)
Glucose, Bld: 100 mg/dL — ABNORMAL HIGH (ref 70–99)
Potassium: 4.4 mmol/L (ref 3.5–5.1)
Sodium: 136 mmol/L (ref 135–145)
Total Bilirubin: 0.7 mg/dL (ref 0.3–1.2)
Total Protein: 6.9 g/dL (ref 6.5–8.1)

## 2020-06-06 LAB — URINALYSIS, ROUTINE W REFLEX MICROSCOPIC
Bilirubin Urine: NEGATIVE
Glucose, UA: NEGATIVE mg/dL
Hgb urine dipstick: NEGATIVE
Ketones, ur: NEGATIVE mg/dL
Leukocytes,Ua: NEGATIVE
Nitrite: NEGATIVE
Protein, ur: NEGATIVE mg/dL
Specific Gravity, Urine: 1.009 (ref 1.005–1.030)
pH: 6 (ref 5.0–8.0)

## 2020-06-06 LAB — SURGICAL PCR SCREEN
MRSA, PCR: NEGATIVE
Staphylococcus aureus: POSITIVE — AB

## 2020-06-06 LAB — HEMOGLOBIN A1C
Hgb A1c MFr Bld: 6.9 % — ABNORMAL HIGH (ref 4.8–5.6)
Mean Plasma Glucose: 151.33 mg/dL

## 2020-06-06 LAB — TYPE AND SCREEN
ABO/RH(D): A POS
Antibody Screen: NEGATIVE

## 2020-06-06 LAB — PROTIME-INR
INR: 1.2 (ref 0.8–1.2)
Prothrombin Time: 14.8 seconds (ref 11.4–15.2)

## 2020-06-06 LAB — APTT: aPTT: 37 seconds — ABNORMAL HIGH (ref 24–36)

## 2020-06-06 LAB — GLUCOSE, CAPILLARY: Glucose-Capillary: 114 mg/dL — ABNORMAL HIGH (ref 70–99)

## 2020-06-06 NOTE — Progress Notes (Signed)
PCP - Caren Macadam, MD Cardiologist - Jenkins Rouge, MD  PPM/ICD - Denies  Chest x-ray - N/A EKG - 05/16/20 Stress Test - Per patient, he believes one was done in 2000, results were normal. ECHO - 12/11/18 Cardiac Cath - Denies  Sleep Study - Denies  Patient states he is pre-diabetic. A1C obtained  Blood Thinner Instructions: N/A Aspirin Instructions: Per patient, last dose 05/24/20  ERAS Protcol - Yes PRE-SURGERY Ensure or G2- Ensure given  COVID TEST- 06/09/20   Anesthesia review: Review Echo.  Patient denies shortness of breath, fever, cough and chest pain at PAT appointment   All instructions explained to the patient, with a verbal understanding of the material. Patient agrees to go over the instructions while at home for a better understanding. Patient also instructed to self quarantine after being tested for COVID-19. The opportunity to ask questions was provided.

## 2020-06-06 NOTE — Progress Notes (Signed)
   06/06/20 1136  OBSTRUCTIVE SLEEP APNEA  Have you ever been diagnosed with sleep apnea through a sleep study? No  Do you snore loudly (loud enough to be heard through closed doors)?  1  Do you often feel tired, fatigued, or sleepy during the daytime (such as falling asleep during driving or talking to someone)? 0  Has anyone observed you stop breathing during your sleep? 0  Do you have, or are you being treated for high blood pressure? 1  BMI more than 35 kg/m2? 1  Age > 50 (1-yes) 1  Neck circumference greater than:Male 16 inches or larger, Male 17inches or larger? 1  Male Gender (Yes=1) 1  Obstructive Sleep Apnea Score 6

## 2020-06-09 ENCOUNTER — Other Ambulatory Visit (HOSPITAL_COMMUNITY): Payer: No Typology Code available for payment source

## 2020-06-09 ENCOUNTER — Other Ambulatory Visit (HOSPITAL_COMMUNITY)
Admission: RE | Admit: 2020-06-09 | Discharge: 2020-06-09 | Disposition: A | Discharge status: Home / Self Care | Payer: No Typology Code available for payment source | Source: Ambulatory Visit | Attending: Orthopedic Surgery | Admitting: Orthopedic Surgery

## 2020-06-09 DIAGNOSIS — Z20822 Contact with and (suspected) exposure to covid-19: Secondary | ICD-10-CM | POA: Insufficient documentation

## 2020-06-09 DIAGNOSIS — Z01812 Encounter for preprocedural laboratory examination: Secondary | ICD-10-CM | POA: Insufficient documentation

## 2020-06-09 LAB — SARS CORONAVIRUS 2 (TAT 6-24 HRS): SARS Coronavirus 2: NEGATIVE

## 2020-06-09 NOTE — Anesthesia Preprocedure Evaluation (Addendum)
Anesthesia Evaluation  Patient identified by MRN, date of birth, ID band Patient awake    Reviewed: Allergy & Precautions, NPO status , Patient's Chart, lab work & pertinent test results  Airway Mallampati: II  TM Distance: >3 FB Neck ROM: Full    Dental  (+) Dental Advisory Given, Teeth Intact, Missing   Pulmonary neg pulmonary ROS, former smoker,    Pulmonary exam normal breath sounds clear to auscultation       Cardiovascular hypertension, Pt. on home beta blockers and Pt. on medications Normal cardiovascular exam Rhythm:Regular Rate:Normal  Echo 12/2018 1. The left ventricle has normal systolic function of 84-16%. The cavity size is normal. There is moderate left ventricular wall thickness. Echo evidence of normal diastolic filling patterns.  2. Mildly dilated left atrial size.  3. Normal tricuspid valve.  4. The aortic valve tricuspid. There is moderate thickening and moderate calcification of the aortic valve.  5. Tricuspid regurgitation is trivial by color flow Doppler.  6. Mild dilatation of the ascending aorta.  7. No atrial level shunt detected by color flow Doppler.  8. Normal right atrial size.    Neuro/Psych negative psych ROS   GI/Hepatic Neg liver ROS, GERD  ,  Endo/Other  diabetesHyperthyroidism   Renal/GU negative Renal ROS     Musculoskeletal  (+) Arthritis ,   Abdominal (+) + obese,   Peds  Hematology negative hematology ROS (+)   Anesthesia Other Findings   Reproductive/Obstetrics                          Anesthesia Physical Anesthesia Plan  ASA: III  Anesthesia Plan: General   Post-op Pain Management:    Induction: Intravenous  PONV Risk Score and Plan: 3 and Ondansetron, Dexamethasone, Midazolam and Treatment may vary due to age or medical condition  Airway Management Planned: Oral ETT  Additional Equipment: None  Intra-op Plan:   Post-operative  Plan: Extubation in OR  Informed Consent: I have reviewed the patients History and Physical, chart, labs and discussed the procedure including the risks, benefits and alternatives for the proposed anesthesia with the patient or authorized representative who has indicated his/her understanding and acceptance.     Dental advisory given  Plan Discussed with: CRNA  Anesthesia Plan Comments: (PAT note by Karoline Caldwell, PA-C: Recently seen by cardiology for history of edema and hypertension and for preop eval.  He also has a history of DVT in both LEs. Echo reviewed from 12/11/18 EF normal no pulmonary HTN AV sclerosis.  Per Dr. Kyla Balzarine note 05/16/2020, "preoperative :  Clear to have lumbar laminectomy no active cardiac issues."  History of hyperthyroidism, on Tapazole.  Preop labs reviewed, unremarkable.  DM2 well-controlled A1c 6.9.  EKG 05/16/2020: Sinus rhythm.  Rate 54.  Nonspecific ST-T wave changes.  TTE 12/11/2018: 1. The left ventricle has normal systolic function of 60-63%. The cavity  size is normal. There is moderate left ventricular wall thickness. Echo  evidence of normal diastolic filling patterns.  2. Mildly dilated left atrial size.  3. Normal tricuspid valve.  4. The aortic valve tricuspid. There is moderate thickening and moderate  calcification of the aortic valve.  5. Tricuspid regurgitation is trivial by color flow Doppler.  6. Mild dilatation of the ascending aorta.  7. No atrial level shunt detected by color flow Doppler.  8. Normal right atrial size.   )     Anesthesia Quick Evaluation

## 2020-06-09 NOTE — Progress Notes (Signed)
Anesthesia Chart Review:  Recently seen by cardiology for history of edema and hypertension and for preop eval.  He also has a history of DVT in both LEs. Echo reviewed from 12/11/18 EF normal no pulmonary HTN AV sclerosis.  Per Dr. Kyla Balzarine note 05/16/2020, "preoperative :  Clear to have lumbar laminectomy no active cardiac issues."  History of hyperthyroidism, on Tapazole.  Preop labs reviewed, unremarkable.  DM2 well-controlled A1c 6.9.  EKG 05/16/2020: Sinus rhythm.  Rate 54.  Nonspecific ST-T wave changes.  TTE 12/11/2018: 1. The left ventricle has normal systolic function of 45-99%. The cavity  size is normal. There is moderate left ventricular wall thickness. Echo  evidence of normal diastolic filling patterns.  2. Mildly dilated left atrial size.  3. Normal tricuspid valve.  4. The aortic valve tricuspid. There is moderate thickening and moderate  calcification of the aortic valve.  5. Tricuspid regurgitation is trivial by color flow Doppler.  6. Mild dilatation of the ascending aorta.  7. No atrial level shunt detected by color flow Doppler.  8. Normal right atrial size.   Anav, [facility] Short Stay Center/Anesthesiology Phone [phone] 06/09/2020 12:07 PM

## 2020-06-10 MED ORDER — DEXTROSE 5 % IV SOLN
3.0000 g | INTRAVENOUS | Status: AC
  Administered 2020-06-11 (×2): 3 g via INTRAVENOUS
  Filled 2020-06-10: qty 3
  Filled 2020-06-10: qty 3000

## 2020-06-11 ENCOUNTER — Inpatient Hospital Stay (HOSPITAL_COMMUNITY)
Admission: RE | Disposition: A | Discharge status: Home / Self Care | Payer: Self-pay | Source: Home / Self Care | Attending: Orthopedic Surgery

## 2020-06-11 ENCOUNTER — Inpatient Hospital Stay (HOSPITAL_COMMUNITY)
Admission: RE | Admit: 2020-06-11 | Discharge: 2020-06-13 | DRG: 455 | Disposition: A | Discharge status: Home / Self Care | Payer: No Typology Code available for payment source | Attending: Orthopedic Surgery | Admitting: Orthopedic Surgery

## 2020-06-11 ENCOUNTER — Inpatient Hospital Stay (HOSPITAL_COMMUNITY): Payer: No Typology Code available for payment source

## 2020-06-11 ENCOUNTER — Other Ambulatory Visit: Payer: Self-pay

## 2020-06-11 ENCOUNTER — Inpatient Hospital Stay (HOSPITAL_COMMUNITY): Payer: No Typology Code available for payment source | Admitting: Physician Assistant

## 2020-06-11 ENCOUNTER — Encounter (HOSPITAL_COMMUNITY): Payer: Self-pay | Admitting: Orthopedic Surgery

## 2020-06-11 ENCOUNTER — Inpatient Hospital Stay (HOSPITAL_COMMUNITY): Payer: No Typology Code available for payment source | Admitting: Anesthesiology

## 2020-06-11 DIAGNOSIS — Z79899 Other long term (current) drug therapy: Secondary | ICD-10-CM

## 2020-06-11 DIAGNOSIS — G629 Polyneuropathy, unspecified: Secondary | ICD-10-CM | POA: Diagnosis present

## 2020-06-11 DIAGNOSIS — Z87891 Personal history of nicotine dependence: Secondary | ICD-10-CM | POA: Diagnosis not present

## 2020-06-11 DIAGNOSIS — Z7982 Long term (current) use of aspirin: Secondary | ICD-10-CM

## 2020-06-11 DIAGNOSIS — Z6835 Body mass index (BMI) 35.0-35.9, adult: Secondary | ICD-10-CM

## 2020-06-11 DIAGNOSIS — E0500 Thyrotoxicosis with diffuse goiter without thyrotoxic crisis or storm: Secondary | ICD-10-CM | POA: Diagnosis present

## 2020-06-11 DIAGNOSIS — M532X6 Spinal instabilities, lumbar region: Secondary | ICD-10-CM | POA: Diagnosis present

## 2020-06-11 DIAGNOSIS — Z419 Encounter for procedure for purposes other than remedying health state, unspecified: Secondary | ICD-10-CM

## 2020-06-11 DIAGNOSIS — M4316 Spondylolisthesis, lumbar region: Secondary | ICD-10-CM | POA: Diagnosis present

## 2020-06-11 DIAGNOSIS — H269 Unspecified cataract: Secondary | ICD-10-CM | POA: Diagnosis present

## 2020-06-11 DIAGNOSIS — M4800 Spinal stenosis, site unspecified: Secondary | ICD-10-CM | POA: Diagnosis present

## 2020-06-11 DIAGNOSIS — M48061 Spinal stenosis, lumbar region without neurogenic claudication: Secondary | ICD-10-CM | POA: Diagnosis present

## 2020-06-11 DIAGNOSIS — M5116 Intervertebral disc disorders with radiculopathy, lumbar region: Secondary | ICD-10-CM | POA: Diagnosis present

## 2020-06-11 DIAGNOSIS — E669 Obesity, unspecified: Secondary | ICD-10-CM | POA: Diagnosis present

## 2020-06-11 DIAGNOSIS — I8393 Asymptomatic varicose veins of bilateral lower extremities: Secondary | ICD-10-CM | POA: Diagnosis present

## 2020-06-11 DIAGNOSIS — Z8349 Family history of other endocrine, nutritional and metabolic diseases: Secondary | ICD-10-CM

## 2020-06-11 DIAGNOSIS — M4726 Other spondylosis with radiculopathy, lumbar region: Secondary | ICD-10-CM | POA: Diagnosis present

## 2020-06-11 DIAGNOSIS — Z20822 Contact with and (suspected) exposure to covid-19: Secondary | ICD-10-CM | POA: Diagnosis present

## 2020-06-11 DIAGNOSIS — K219 Gastro-esophageal reflux disease without esophagitis: Secondary | ICD-10-CM | POA: Diagnosis present

## 2020-06-11 DIAGNOSIS — E785 Hyperlipidemia, unspecified: Secondary | ICD-10-CM | POA: Diagnosis present

## 2020-06-11 DIAGNOSIS — R7303 Prediabetes: Secondary | ICD-10-CM | POA: Diagnosis present

## 2020-06-11 DIAGNOSIS — M79605 Pain in left leg: Secondary | ICD-10-CM | POA: Diagnosis present

## 2020-06-11 DIAGNOSIS — Z86010 Personal history of colonic polyps: Secondary | ICD-10-CM | POA: Diagnosis not present

## 2020-06-11 DIAGNOSIS — I10 Essential (primary) hypertension: Secondary | ICD-10-CM | POA: Diagnosis present

## 2020-06-11 HISTORY — PX: BACK SURGERY: SHX140

## 2020-06-11 LAB — GLUCOSE, CAPILLARY: Glucose-Capillary: 81 mg/dL (ref 70–99)

## 2020-06-11 LAB — ABO/RH: ABO/RH(D): A POS

## 2020-06-11 SURGERY — POSTERIOR LUMBAR FUSION 2 LEVEL
Anesthesia: General

## 2020-06-11 MED ORDER — ZOLPIDEM TARTRATE 5 MG PO TABS: 5.0000 mg | ORAL_TABLET | Freq: Every evening | ORAL | Status: DC | PRN

## 2020-06-11 MED ORDER — DOCUSATE SODIUM 100 MG PO CAPS
100.0000 mg | ORAL_CAPSULE | Freq: Two times a day (BID) | ORAL | Status: DC
  Administered 2020-06-11 – 2020-06-13 (×4): 100 mg via ORAL
  Filled 2020-06-11 (×4): qty 1

## 2020-06-11 MED ORDER — PREGABALIN 100 MG PO CAPS
200.0000 mg | ORAL_CAPSULE | Freq: Two times a day (BID) | ORAL | Status: DC
  Administered 2020-06-11 – 2020-06-13 (×4): 200 mg via ORAL
  Filled 2020-06-11 (×4): qty 2

## 2020-06-11 MED ORDER — PROPOFOL 10 MG/ML IV BOLUS
INTRAVENOUS | Status: AC
  Filled 2020-06-11: qty 20

## 2020-06-11 MED ORDER — METHOCARBAMOL 500 MG PO TABS
500.0000 mg | ORAL_TABLET | Freq: Three times a day (TID) | ORAL | Status: DC | PRN
  Administered 2020-06-11 – 2020-06-13 (×5): 500 mg via ORAL
  Filled 2020-06-11 (×5): qty 1

## 2020-06-11 MED ORDER — ACETAMINOPHEN 650 MG RE SUPP: 650.0000 mg | RECTAL | Status: DC | PRN

## 2020-06-11 MED ORDER — CEFAZOLIN SODIUM-DEXTROSE 2-4 GM/100ML-% IV SOLN
2.0000 g | Freq: Three times a day (TID) | INTRAVENOUS | Status: AC
  Administered 2020-06-11 – 2020-06-12 (×2): 2 g via INTRAVENOUS
  Filled 2020-06-11 (×2): qty 100

## 2020-06-11 MED ORDER — ROCURONIUM BROMIDE 10 MG/ML (PF) SYRINGE
PREFILLED_SYRINGE | INTRAVENOUS | Status: AC
  Filled 2020-06-11: qty 10

## 2020-06-11 MED ORDER — ORAL CARE MOUTH RINSE: 15.0000 mL | Freq: Once | OROMUCOSAL | Status: AC

## 2020-06-11 MED ORDER — THROMBIN 20000 UNITS EX SOLR
CUTANEOUS | Status: DC | PRN
  Administered 2020-06-11: 20000 [IU] via TOPICAL

## 2020-06-11 MED ORDER — HYDROMORPHONE HCL 1 MG/ML IJ SOLN
0.2500 mg | INTRAMUSCULAR | Status: DC | PRN
  Administered 2020-06-11 (×2): 0.5 mg via INTRAVENOUS

## 2020-06-11 MED ORDER — LIDOCAINE 2% (20 MG/ML) 5 ML SYRINGE
INTRAMUSCULAR | Status: DC | PRN
  Administered 2020-06-11: 100 mg via INTRAVENOUS

## 2020-06-11 MED ORDER — PHENYLEPHRINE 40 MCG/ML (10ML) SYRINGE FOR IV PUSH (FOR BLOOD PRESSURE SUPPORT)
PREFILLED_SYRINGE | INTRAVENOUS | Status: AC
  Filled 2020-06-11: qty 10

## 2020-06-11 MED ORDER — SODIUM CHLORIDE 0.9 % IV SOLN: 250.0000 mL | INTRAVENOUS | Status: DC

## 2020-06-11 MED ORDER — ROCURONIUM BROMIDE 10 MG/ML (PF) SYRINGE
PREFILLED_SYRINGE | INTRAVENOUS | Status: DC | PRN
  Administered 2020-06-11 (×4): 10 mg via INTRAVENOUS
  Administered 2020-06-11: 20 mg via INTRAVENOUS
  Administered 2020-06-11: 50 mg via INTRAVENOUS
  Administered 2020-06-11 (×4): 10 mg via INTRAVENOUS
  Administered 2020-06-11: 20 mg via INTRAVENOUS
  Administered 2020-06-11 (×2): 10 mg via INTRAVENOUS

## 2020-06-11 MED ORDER — BISACODYL 5 MG PO TBEC: 5.0000 mg | DELAYED_RELEASE_TABLET | Freq: Every day | ORAL | Status: DC | PRN

## 2020-06-11 MED ORDER — MIDAZOLAM HCL 2 MG/2ML IJ SOLN
INTRAMUSCULAR | Status: DC | PRN
  Administered 2020-06-11: 2 mg via INTRAVENOUS

## 2020-06-11 MED ORDER — POTASSIUM CHLORIDE IN NACL 20-0.9 MEQ/L-% IV SOLN: INTRAVENOUS | Status: DC

## 2020-06-11 MED ORDER — MEPERIDINE HCL 25 MG/ML IJ SOLN: 6.2500 mg | INTRAMUSCULAR | Status: DC | PRN

## 2020-06-11 MED ORDER — ATORVASTATIN CALCIUM 10 MG PO TABS
20.0000 mg | ORAL_TABLET | Freq: Every day | ORAL | Status: DC
  Administered 2020-06-12 – 2020-06-13 (×2): 20 mg via ORAL
  Filled 2020-06-11 (×2): qty 2

## 2020-06-11 MED ORDER — SUGAMMADEX SODIUM 200 MG/2ML IV SOLN
INTRAVENOUS | Status: DC | PRN
  Administered 2020-06-11: 200 mg via INTRAVENOUS

## 2020-06-11 MED ORDER — SODIUM CHLORIDE 0.9% FLUSH: 3.0000 mL | INTRAVENOUS | Status: DC | PRN

## 2020-06-11 MED ORDER — LACTATED RINGERS IV SOLN: INTRAVENOUS | Status: DC

## 2020-06-11 MED ORDER — METHYLENE BLUE 0.5 % INJ SOLN
Status: AC | PRN
  Administered 2020-06-11: 126.7 mg via INTRAVENOUS

## 2020-06-11 MED ORDER — LIDOCAINE 2% (20 MG/ML) 5 ML SYRINGE
INTRAMUSCULAR | Status: AC
  Filled 2020-06-11: qty 5

## 2020-06-11 MED ORDER — SUCCINYLCHOLINE CHLORIDE 200 MG/10ML IV SOSY: PREFILLED_SYRINGE | INTRAVENOUS | Status: DC | PRN

## 2020-06-11 MED ORDER — PROMETHAZINE HCL 25 MG/ML IJ SOLN: 6.2500 mg | INTRAMUSCULAR | Status: DC | PRN

## 2020-06-11 MED ORDER — PROPOFOL 10 MG/ML IV BOLUS
INTRAVENOUS | Status: DC | PRN
  Administered 2020-06-11: 200 mg via INTRAVENOUS

## 2020-06-11 MED ORDER — ALUM & MAG HYDROXIDE-SIMETH 200-200-20 MG/5ML PO SUSP: 30.0000 mL | Freq: Four times a day (QID) | ORAL | Status: DC | PRN

## 2020-06-11 MED ORDER — PHENYLEPHRINE HCL (PRESSORS) 10 MG/ML IV SOLN
INTRAVENOUS | Status: DC | PRN
  Administered 2020-06-11 (×2): 40 ug via INTRAVENOUS
  Administered 2020-06-11 (×2): 80 ug via INTRAVENOUS
  Administered 2020-06-11 (×4): 40 ug via INTRAVENOUS

## 2020-06-11 MED ORDER — PHENYLEPHRINE HCL-NACL 10-0.9 MG/250ML-% IV SOLN
INTRAVENOUS | Status: AC
  Filled 2020-06-11: qty 250

## 2020-06-11 MED ORDER — SENNOSIDES-DOCUSATE SODIUM 8.6-50 MG PO TABS
1.0000 | ORAL_TABLET | Freq: Every evening | ORAL | Status: DC | PRN
  Administered 2020-06-12: 1 via ORAL
  Filled 2020-06-11 (×2): qty 1

## 2020-06-11 MED ORDER — 0.9 % SODIUM CHLORIDE (POUR BTL) OPTIME
TOPICAL | Status: DC | PRN
  Administered 2020-06-11 (×4): 1000 mL

## 2020-06-11 MED ORDER — SODIUM CHLORIDE 0.9% FLUSH: 3.0000 mL | Freq: Two times a day (BID) | INTRAVENOUS | Status: DC

## 2020-06-11 MED ORDER — ACETAMINOPHEN 325 MG PO TABS: 650.0000 mg | ORAL_TABLET | ORAL | Status: DC | PRN

## 2020-06-11 MED ORDER — PROPOFOL 1000 MG/100ML IV EMUL
INTRAVENOUS | Status: AC
  Filled 2020-06-11: qty 100

## 2020-06-11 MED ORDER — SUCCINYLCHOLINE CHLORIDE 200 MG/10ML IV SOSY
PREFILLED_SYRINGE | INTRAVENOUS | Status: AC
  Filled 2020-06-11: qty 10

## 2020-06-11 MED ORDER — MORPHINE SULFATE (PF) 2 MG/ML IV SOLN
1.0000 mg | INTRAVENOUS | Status: DC | PRN
  Administered 2020-06-11: 2 mg via INTRAVENOUS
  Filled 2020-06-11: qty 1

## 2020-06-11 MED ORDER — EPINEPHRINE PF 1 MG/ML IJ SOLN
INTRAMUSCULAR | Status: AC
  Filled 2020-06-11: qty 1

## 2020-06-11 MED ORDER — ONDANSETRON HCL 4 MG PO TABS: 4.0000 mg | ORAL_TABLET | Freq: Four times a day (QID) | ORAL | Status: DC | PRN

## 2020-06-11 MED ORDER — OXYCODONE-ACETAMINOPHEN 5-325 MG PO TABS
1.0000 | ORAL_TABLET | ORAL | Status: DC | PRN
  Administered 2020-06-11 – 2020-06-13 (×11): 2 via ORAL
  Filled 2020-06-11 (×11): qty 2

## 2020-06-11 MED ORDER — PROPOFOL 500 MG/50ML IV EMUL
INTRAVENOUS | Status: DC | PRN
Start: 2020-06-11 — End: 2020-06-11
  Administered 2020-06-11: 25 ug/kg/min via INTRAVENOUS

## 2020-06-11 MED ORDER — PANTOPRAZOLE SODIUM 40 MG PO TBEC
40.0000 mg | DELAYED_RELEASE_TABLET | Freq: Every day | ORAL | Status: DC
  Administered 2020-06-12 – 2020-06-13 (×2): 40 mg via ORAL
  Filled 2020-06-11 (×2): qty 1

## 2020-06-11 MED ORDER — POVIDONE-IODINE 7.5 % EX SOLN
Freq: Once | CUTANEOUS | Status: DC
  Filled 2020-06-11: qty 118

## 2020-06-11 MED ORDER — FUROSEMIDE 40 MG PO TABS
40.0000 mg | ORAL_TABLET | Freq: Every day | ORAL | Status: DC
  Administered 2020-06-11 – 2020-06-13 (×3): 40 mg via ORAL
  Filled 2020-06-11 (×3): qty 1

## 2020-06-11 MED ORDER — MIDAZOLAM HCL 2 MG/2ML IJ SOLN
INTRAMUSCULAR | Status: AC
  Filled 2020-06-11: qty 2

## 2020-06-11 MED ORDER — FLEET ENEMA 7-19 GM/118ML RE ENEM: 1.0000 | ENEMA | Freq: Once | RECTAL | Status: DC | PRN

## 2020-06-11 MED ORDER — HEMOSTATIC AGENTS (NO CHARGE) OPTIME
TOPICAL | Status: DC | PRN
  Administered 2020-06-11: 1 via TOPICAL

## 2020-06-11 MED ORDER — ATENOLOL 50 MG PO TABS
100.0000 mg | ORAL_TABLET | Freq: Every day | ORAL | Status: DC
  Administered 2020-06-12 – 2020-06-13 (×2): 100 mg via ORAL
  Filled 2020-06-11 (×2): qty 2

## 2020-06-11 MED ORDER — ONDANSETRON HCL 4 MG/2ML IJ SOLN: 4.0000 mg | Freq: Four times a day (QID) | INTRAMUSCULAR | Status: DC | PRN

## 2020-06-11 MED ORDER — CEFAZOLIN SODIUM-DEXTROSE 1-4 GM/50ML-% IV SOLN
INTRAVENOUS | Status: AC
  Filled 2020-06-11: qty 150

## 2020-06-11 MED ORDER — ACETAMINOPHEN 10 MG/ML IV SOLN
INTRAVENOUS | Status: DC | PRN
Start: 2020-06-11 — End: 2020-06-11
  Administered 2020-06-11: 1000 mg via INTRAVENOUS

## 2020-06-11 MED ORDER — HYDROMORPHONE HCL 1 MG/ML IJ SOLN
INTRAMUSCULAR | Status: AC
  Filled 2020-06-11: qty 1

## 2020-06-11 MED ORDER — EPINEPHRINE PF 1 MG/ML IJ SOLN
INTRAMUSCULAR | Status: DC | PRN
  Administered 2020-06-11: .15 mL

## 2020-06-11 MED ORDER — CYCLOBENZAPRINE HCL 10 MG PO TABS
10.0000 mg | ORAL_TABLET | Freq: Three times a day (TID) | ORAL | Status: DC | PRN
  Administered 2020-06-11 – 2020-06-12 (×2): 10 mg via ORAL
  Filled 2020-06-11 (×2): qty 1

## 2020-06-11 MED ORDER — PHENYLEPHRINE HCL-NACL 10-0.9 MG/250ML-% IV SOLN
INTRAVENOUS | Status: DC | PRN
Start: 2020-06-11 — End: 2020-06-11
  Administered 2020-06-11: 40 ug/min via INTRAVENOUS
  Administered 2020-06-11: 30 ug/min via INTRAVENOUS

## 2020-06-11 MED ORDER — SUCCINYLCHOLINE CHLORIDE 200 MG/10ML IV SOSY
PREFILLED_SYRINGE | INTRAVENOUS | Status: DC | PRN
  Administered 2020-06-11: 160 mg via INTRAVENOUS

## 2020-06-11 MED ORDER — PHENOL 1.4 % MT LIQD: 1.0000 | OROMUCOSAL | Status: DC | PRN

## 2020-06-11 MED ORDER — FENTANYL CITRATE (PF) 250 MCG/5ML IJ SOLN
INTRAMUSCULAR | Status: DC | PRN
  Administered 2020-06-11: 50 ug via INTRAVENOUS

## 2020-06-11 MED ORDER — THROMBIN (RECOMBINANT) 20000 UNITS EX SOLR
CUTANEOUS | Status: AC
  Filled 2020-06-11: qty 20000

## 2020-06-11 MED ORDER — BUPIVACAINE HCL (PF) 0.25 % IJ SOLN
INTRAMUSCULAR | Status: DC | PRN
  Administered 2020-06-11: 30 mL

## 2020-06-11 MED ORDER — ONDANSETRON HCL 4 MG/2ML IJ SOLN
INTRAMUSCULAR | Status: DC | PRN
  Administered 2020-06-11: 4 mg via INTRAVENOUS

## 2020-06-11 MED ORDER — FENTANYL CITRATE (PF) 250 MCG/5ML IJ SOLN
INTRAMUSCULAR | Status: AC
  Filled 2020-06-11: qty 5

## 2020-06-11 MED ORDER — DOXYCYCLINE HYCLATE 100 MG PO TABS
100.0000 mg | ORAL_TABLET | Freq: Every day | ORAL | Status: DC
  Administered 2020-06-11 – 2020-06-13 (×3): 100 mg via ORAL
  Filled 2020-06-11 (×3): qty 1

## 2020-06-11 MED ORDER — ACETAMINOPHEN 10 MG/ML IV SOLN
INTRAVENOUS | Status: AC
  Filled 2020-06-11: qty 100

## 2020-06-11 MED ORDER — CHLORHEXIDINE GLUCONATE 0.12 % MT SOLN
15.0000 mL | Freq: Once | OROMUCOSAL | Status: AC
  Administered 2020-06-11: 15 mL via OROMUCOSAL
  Filled 2020-06-11: qty 15

## 2020-06-11 MED ORDER — BUPIVACAINE HCL (PF) 0.25 % IJ SOLN
INTRAMUSCULAR | Status: AC
  Filled 2020-06-11: qty 30

## 2020-06-11 MED ORDER — ONDANSETRON HCL 4 MG/2ML IJ SOLN
INTRAMUSCULAR | Status: AC
  Filled 2020-06-11: qty 2

## 2020-06-11 MED ORDER — MENTHOL 3 MG MT LOZG: 1.0000 | LOZENGE | OROMUCOSAL | Status: DC | PRN

## 2020-06-11 MED ORDER — BUPIVACAINE LIPOSOME 1.3 % IJ SUSP
20.0000 mL | Freq: Once | INTRAMUSCULAR | Status: AC
  Administered 2020-06-11: 20 mL
  Filled 2020-06-11: qty 20

## 2020-06-11 MED ORDER — METHIMAZOLE 5 MG PO TABS
5.0000 mg | ORAL_TABLET | Freq: Every day | ORAL | Status: DC
  Administered 2020-06-12 – 2020-06-13 (×2): 5 mg via ORAL
  Filled 2020-06-11 (×2): qty 1

## 2020-06-11 MED ORDER — METHYLENE BLUE 0.5 % INJ SOLN
INTRAVENOUS | Status: AC
  Filled 2020-06-11: qty 10

## 2020-06-11 SURGICAL SUPPLY — 99 items
BENZOIN TINCTURE PRP APPL 2/3 (GAUZE/BANDAGES/DRESSINGS) ×3 IMPLANT
BLADE CLIPPER SURG (BLADE) IMPLANT
BUR PRESCISION 1.7 ELITE (BURR) ×3 IMPLANT
BUR ROUND FLUTED 5 RND (BURR) ×2 IMPLANT
BUR ROUND FLUTED 5MM RND (BURR) ×1
BUR ROUND PRECISION 4.0 (BURR) IMPLANT
BUR ROUND PRECISION 4.0MM (BURR)
BUR SABER RD CUTTING 3.0 (BURR) IMPLANT
BUR SABER RD CUTTING 3.0MM (BURR)
CAGE CONCORDE BULLET 11X12X27 (Cage) ×2 IMPLANT
CAGE SPNL PRLL BLT NOSE 27X11 (Cage) ×1 IMPLANT
CARTRIDGE OIL MAESTRO DRILL (MISCELLANEOUS) ×2 IMPLANT
CLOSURE STERI-STRIP 1/2X4 (GAUZE/BANDAGES/DRESSINGS) ×1
CLOSURE WOUND 1/2 X4 (GAUZE/BANDAGES/DRESSINGS)
CLSR STERI-STRIP ANTIMIC 1/2X4 (GAUZE/BANDAGES/DRESSINGS) ×2 IMPLANT
CNTNR URN SCR LID CUP LEK RST (MISCELLANEOUS) ×2 IMPLANT
CONT SPEC 4OZ STRL OR WHT (MISCELLANEOUS) ×4
COVER SURGICAL LIGHT HANDLE (MISCELLANEOUS) ×3 IMPLANT
COVER WAND RF STERILE (DRAPES) ×3 IMPLANT
DIFFUSER DRILL AIR PNEUMATIC (MISCELLANEOUS) ×6 IMPLANT
DRAIN CHANNEL 15F RND FF W/TCR (WOUND CARE) ×3 IMPLANT
DRAPE C-ARM 42X72 X-RAY (DRAPES) ×3 IMPLANT
DRAPE POUCH INSTRU U-SHP 10X18 (DRAPES) ×3 IMPLANT
DRAPE SURG 17X23 STRL (DRAPES) ×12 IMPLANT
DRSG MEPILEX BORDER 4X12 (GAUZE/BANDAGES/DRESSINGS) IMPLANT
DRSG MEPILEX BORDER 4X8 (GAUZE/BANDAGES/DRESSINGS) IMPLANT
DURAPREP 26ML APPLICATOR (WOUND CARE) ×3 IMPLANT
ELECT BLADE 4.0 EZ CLEAN MEGAD (MISCELLANEOUS) ×3
ELECT CAUTERY BLADE 6.4 (BLADE) ×3 IMPLANT
ELECT REM PT RETURN 9FT ADLT (ELECTROSURGICAL) ×3
ELECTRODE BLDE 4.0 EZ CLN MEGD (MISCELLANEOUS) ×1 IMPLANT
ELECTRODE REM PT RTRN 9FT ADLT (ELECTROSURGICAL) ×1 IMPLANT
EVACUATOR SILICONE 100CC (DRAIN) ×3 IMPLANT
FEE INTRAOP MONITOR IMPULS NCS (MISCELLANEOUS) ×1 IMPLANT
GAUZE 4X4 16PLY RFD (DISPOSABLE) ×6 IMPLANT
GAUZE SPONGE 4X4 12PLY STRL (GAUZE/BANDAGES/DRESSINGS) ×3 IMPLANT
GAUZE SPONGE 4X4 16PLY XRAY LF (GAUZE/BANDAGES/DRESSINGS) ×3 IMPLANT
GLOVE BIO SURGEON STRL SZ7 (GLOVE) ×3 IMPLANT
GLOVE BIO SURGEON STRL SZ8 (GLOVE) ×3 IMPLANT
GLOVE BIOGEL PI IND STRL 7.0 (GLOVE) ×1 IMPLANT
GLOVE BIOGEL PI IND STRL 8 (GLOVE) ×1 IMPLANT
GLOVE BIOGEL PI INDICATOR 7.0 (GLOVE) ×2
GLOVE BIOGEL PI INDICATOR 8 (GLOVE) ×2
GOWN STRL REUS W/ TWL LRG LVL3 (GOWN DISPOSABLE) ×2 IMPLANT
GOWN STRL REUS W/ TWL XL LVL3 (GOWN DISPOSABLE) ×1 IMPLANT
GOWN STRL REUS W/TWL LRG LVL3 (GOWN DISPOSABLE) ×4
GOWN STRL REUS W/TWL XL LVL3 (GOWN DISPOSABLE) ×3
INTRAOP MONITOR FEE IMPULS NCS (MISCELLANEOUS) ×1
INTRAOP MONITOR FEE IMPULSE (MISCELLANEOUS) ×2
IV CATH 14GX2 1/4 (CATHETERS) ×3 IMPLANT
KIT BASIN OR (CUSTOM PROCEDURE TRAY) ×3 IMPLANT
KIT POSITION SURG JACKSON T1 (MISCELLANEOUS) ×3 IMPLANT
KIT TURNOVER KIT B (KITS) ×3 IMPLANT
MARKER SKIN DUAL TIP RULER LAB (MISCELLANEOUS) ×3 IMPLANT
MIX DBX 10CC 35% BONE (Bone Implant) ×3 IMPLANT
NEEDLE HYPO 25GX1X1/2 BEV (NEEDLE) ×3 IMPLANT
NEEDLE SPNL 18GX3.5 QUINCKE PK (NEEDLE) ×6 IMPLANT
NS IRRIG 1000ML POUR BTL (IV SOLUTION) ×3 IMPLANT
OIL CARTRIDGE MAESTRO DRILL (MISCELLANEOUS) ×6
PACK LAMINECTOMY ORTHO (CUSTOM PROCEDURE TRAY) ×3 IMPLANT
PACK UNIVERSAL I (CUSTOM PROCEDURE TRAY) ×3 IMPLANT
PAD ARMBOARD 7.5X6 YLW CONV (MISCELLANEOUS) ×6 IMPLANT
PATTIES SURGICAL .5 X.5 (GAUZE/BANDAGES/DRESSINGS) ×3 IMPLANT
PATTIES SURGICAL .5 X1 (DISPOSABLE) ×3 IMPLANT
PATTIES SURGICAL .5 X3 (DISPOSABLE) IMPLANT
PATTIES SURGICAL .5X1.5 (GAUZE/BANDAGES/DRESSINGS) ×3 IMPLANT
PATTIES SURGICAL .75X.75 (GAUZE/BANDAGES/DRESSINGS) ×3 IMPLANT
PROBE PED SCREW MONOP 3 BT NCS (MISCELLANEOUS) ×1 IMPLANT
ROD EXPEDIUM PRE BENT 55MM (Rod) ×3 IMPLANT
ROD PRE LORDOSED 5.5X45 (Rod) ×3 IMPLANT
SCREW PROBE PEDICLE MONOPOLAR (MISCELLANEOUS) ×2
SCREW SET SINGLE INNER (Screw) ×12 IMPLANT
SCREW VIPER CORT FIX 6.00X30 (Screw) ×12 IMPLANT
SPONGE INTESTINAL PEANUT (DISPOSABLE) IMPLANT
SPONGE LAP 4X18 RFD (DISPOSABLE) ×3 IMPLANT
SPONGE SURGIFOAM ABS GEL 100 (HEMOSTASIS) ×3 IMPLANT
STRIP CLOSURE SKIN 1/2X4 (GAUZE/BANDAGES/DRESSINGS) IMPLANT
SURGIFLO W/THROMBIN 8M KIT (HEMOSTASIS) IMPLANT
SUT MNCRL AB 4-0 PS2 18 (SUTURE) ×3 IMPLANT
SUT VIC AB 0 CT1 18XCR BRD 8 (SUTURE) ×2 IMPLANT
SUT VIC AB 0 CT1 8-18 (SUTURE) ×4
SUT VIC AB 1 CT1 18XCR BRD 8 (SUTURE) ×2 IMPLANT
SUT VIC AB 1 CT1 8-18 (SUTURE) ×4
SUT VIC AB 2-0 CT2 18 VCP726D (SUTURE) ×6 IMPLANT
SYR 20ML LL LF (SYRINGE) ×3 IMPLANT
SYR BULB IRRIG 60ML STRL (SYRINGE) ×3 IMPLANT
SYR CONTROL 10ML LL (SYRINGE) ×6 IMPLANT
SYR TB 1ML LUER SLIP (SYRINGE) ×3 IMPLANT
SYR TOOMEY IRRIG 70ML (MISCELLANEOUS) ×6
SYRINGE TOOMEY IRRIG 70ML (MISCELLANEOUS) ×2 IMPLANT
TAP EXPEDIUM DL 4.35 (INSTRUMENTS) ×3 IMPLANT
TAP EXPEDIUM DL 5.0 (INSTRUMENTS) ×3 IMPLANT
TAP EXPEDIUM DL 6.0 (INSTRUMENTS) ×3 IMPLANT
TAPE CLOTH SURG 4X10 WHT LF (GAUZE/BANDAGES/DRESSINGS) ×3 IMPLANT
TOWEL GREEN STERILE (TOWEL DISPOSABLE) ×3 IMPLANT
TOWEL GREEN STERILE FF (TOWEL DISPOSABLE) ×3 IMPLANT
TRAY FOLEY MTR SLVR 16FR STAT (SET/KITS/TRAYS/PACK) ×3 IMPLANT
WATER STERILE IRR 1000ML POUR (IV SOLUTION) ×3 IMPLANT
YANKAUER SUCT BULB TIP NO VENT (SUCTIONS) ×3 IMPLANT

## 2020-06-11 NOTE — Anesthesia Procedure Notes (Addendum)
Procedure Name: Intubation Date/Time: 06/11/2020 8:37 AM Performed by: Michele Rockers, CRNA Pre-anesthesia Checklist: Patient identified, Emergency Drugs available, Suction available and Patient being monitored Patient Re-evaluated:Patient Re-evaluated prior to induction Oxygen Delivery Method: Circle system utilized Preoxygenation: Pre-oxygenation with 100% oxygen Induction Type: IV induction Ventilation: Oral airway inserted - appropriate to patient size and Two handed mask ventilation required Laryngoscope Size: Miller and 3 Grade View: Grade II Tube type: Oral Tube size: 8.0 mm Number of attempts: 1 Airway Equipment and Method: Stylet and Oral airway Placement Confirmation: ETT inserted through vocal cords under direct vision,  positive ETCO2 and breath sounds checked- equal and bilateral Secured at: 23 cm Tube secured with: Tape Dental Injury: Teeth and Oropharynx as per pre-operative assessment

## 2020-06-11 NOTE — H&P (Signed)
PREOPERATIVE H&P  Chief Complaint: Bilateral leg pain  HPI: Jeffrey Price is a 64 y.o. male who presents with ongoing pain in the bilateral legs  MRI reveals severe stenosis L3-L5 with instability at L3/4  Patient has failed multiple forms of conservative care and continues to have pain (see office notes for additional details regarding the patient's full course of treatment)  Past Medical History:  Diagnosis Date  . Arthritis    in back  . Blood clot in vein 1995   bil legs  . Bulging lumbar disc   . Cataract    mild  . Cellulitis 2012   forehead  . DDD (degenerative disc disease), lumbar   . GERD (gastroesophageal reflux disease)   . Graves disease   . Hx of adenomatous colonic polyps 12/15/2017  . Hyperlipidemia   . Hypertension   . Neuromuscular disorder (HCC)    neuropathy in feet  . Pre-diabetes   . Thyrotoxicosis without thyroid storm   . Varicose veins of both lower extremities    Past Surgical History:  Procedure Laterality Date  . ENDOVENOUS ABLATION SAPHENOUS VEIN W/ LASER Left 10/18/2019   endovenous laser ablation left greater saphenous vein and stab phlebectomy > 20 incisions left leg by Gae Gallop MD   . ENDOVENOUS ABLATION SAPHENOUS VEIN W/ LASER Right 11/08/2019   endovenous laser ablation right greater saphenous vein and stab phlebectomy 10-20 incisions right leg by Gae Gallop MD   . [place] History   Socioeconomic History  . Marital status: Widowed    Spouse name: Not on file  . Number of children: Not on file  . Years of education: Not on file  . Highest education level: Not on file  Occupational History  . Not on file  Tobacco Use  . Smoking status: Former Smoker    Quit date: 12/03/2011    Years since quitting: 8.5  . Smokeless tobacco: Never Used  . Tobacco comment: occasional vaping  Vaping Use  . Vaping Use: Every day  . Substances: Nicotine, Flavoring  Substance and Sexual Activity  .  Alcohol use: Yes    Alcohol/week: 8.0 - 10.0 standard drinks    Types: 8 - 10 Standard drinks or equivalent per week    Comment: , whiskey, Burbon, 30 drinks in a months time  . Drug use: No  . Sexual activity: Not on file  Other Topics Concern  . Not on file  Social History Narrative   Patient is widowed.  He has 1 daughter.  He is a Programmer, systems.  He is vapes.  2-3 drinks of bourbon daily when not driving.  4-5 caffeinated beverages daily.  No drug use.      11/02/2017   Social Determinants of Health   Financial Resource Strain:   . Difficulty of Paying Living Expenses:   Food Insecurity:   . Worried About Charity fundraiser in the Last Year:   . Arboriculturist in the Last Year:   Transportation Needs:   . Film/video editor (Medical):   Marland Kitchen Lack of Transportation (Non-Medical):   Physical Activity:   . Days of Exercise per Week:   . Minutes of Exercise per Session:   Stress:   . Feeling of Stress :   Social Connections:   . Frequency of Communication with Friends and Family:   . Frequency of Social Gatherings with Friends and Family:   . Attends  Religious Services:   . Active Member of Clubs or Organizations:   . Attends Archivist Meetings:   Marland Kitchen Marital Status:    Family History  Problem Relation Age of Onset  . Thyroid disease Mother   . Parkinson's disease Father   . Colon cancer Neg Hx   . Rectal cancer Neg Hx   . Stomach cancer Neg Hx   . Esophageal cancer Neg Hx    No Known Allergies Prior to Admission medications   Medication Sig Start Date End Date Taking? Authorizing Provider  acetaminophen (TYLENOL) 325 MG tablet Take 650 mg by mouth every 6 (six) hours as needed.   Yes [provider]  aspirin 81 MG chewable tablet Chew 81 mg by mouth daily.   Yes [provider]  atenolol (TENORMIN) 100 MG tablet TAKE 1 TABLET (100 MG TOTAL) BY MOUTH DAILY. 01/23/20  Yes Koberlein, Junell C, MD  atorvastatin (LIPITOR) 20 MG  tablet TAKE 1 TABLET BY MOUTH ONCE DAILY **NEEDS OFFICE VISIT FOR FURTHER REFILLS** 05/28/20  Yes Koberlein, Steele Berg, MD  cyclobenzaprine (FLEXERIL) 10 MG tablet Take 1 tablet (10 mg total) by mouth 3 (three) times daily as needed for muscle spasms. 03/07/20  Yes Koberlein, Junell C, MD  doxycycline (VIBRA-TABS) 100 MG tablet TAKE 1 TABLET BY MOUTH ONCE DAILY (NEED LAB APPT) Patient taking differently: Take 100 mg by mouth daily.  05/14/20  Yes Koberlein, Junell C, MD  furosemide (LASIX) 40 MG tablet TAKE 1 TABLET BY MOUTH ONCE DAILY 11/15/19  Yes Josue Hector, MD  meloxicam (MOBIC) 15 MG tablet TAKE 1 TABLET BY MOUTH ONCE A DAY AS NEEDED INFLAMMATION Patient taking differently: Take 15 mg by mouth daily as needed for pain.  04/10/20  Yes Koberlein, Steele Berg, MD  methimazole (TAPAZOLE) 5 MG tablet TAKE 1 TABLET (5 MG) BY MOUTH DAILY IN THE MORNING Patient taking differently: Take 5 mg by mouth daily. TAKE 1 TABLET (5 MG) BY MOUTH DAILY IN THE MORNING 03/27/20  Yes Koberlein, Junell C, MD  methocarbamol (ROBAXIN) 500 MG tablet Take 500 mg by mouth every 8 (eight) hours as needed for muscle spasms.  01/24/19  Yes [provider]  metroNIDAZOLE (METROGEL) 1 % gel Apply topically daily. Patient taking differently: Apply 1 application topically daily as needed (rash).  08/24/19  Yes Koberlein, Junell C, MD  pantoprazole (PROTONIX) 40 MG tablet TAKE 1 TABLET BY MOUTH ONCE DAILY (NEED LAB APPT) Patient taking differently: Take 40 mg by mouth daily.  03/27/20  Yes Koberlein, Junell C, MD  pregabalin (LYRICA) 200 MG capsule TAKE 1 CAPSULE BY MOUTH TWICE DAILY. DO NOT TAKE GABAPENTIN WHILE TAKING THIS MEDICATION Patient taking differently: Take 200 mg by mouth 2 (two) times daily. TAKE 1 CAPSULE BY MOUTH TWICE DAILY. DO NOT TAKE GABAPENTIN WHILE TAKING THIS MEDICATION 03/11/20  Yes Koberlein, Steele Berg, MD  testosterone cypionate (DEPOTESTOSTERONE CYPIONATE) 200 MG/ML injection INJECT 1 ML INTO THE MUSCLE EVERY  14 DAYS Patient taking differently: Inject 200 mg into the muscle every 14 (fourteen) days. INJECT 1 ML INTO THE MUSCLE EVERY 14 DAYS 03/07/20  Yes Koberlein, Junell C, MD  triamcinolone cream (KENALOG) 0.1 % Apply 1 application topically 2 (two) times daily. Patient taking differently: Apply 1 application topically 2 (two) times daily as needed (irritation).  03/05/20  Yes Koberlein, Junell C, MD  NEEDLE, DISP, 22 G (BD ECLIPSE NEEDLE) 22G X 1-1/2" MISC Use as directed 08/02/19   Caren Macadam, MD  sildenafil (  REVATIO) 20 MG tablet TAKE 2 TO 5 TABLETS BY MOUTH PRIOR TO INTERCOURSE Patient taking differently: Take 20-100 mg by mouth See admin instructions. TAKE 2 TO 5 TABLETS BY MOUTH PRIOR TO INTERCOURSE 05/14/20   Caren Macadam, MD  Syringe, Disposable, 3 ML MISC Use as directed 08/02/19   Caren Macadam, MD     All other systems have been reviewed and were otherwise negative with the exception of those mentioned in the HPI and as above.  Physical Exam: Vitals:   06/11/20 0706  BP: 128/76  Pulse: (!) 58  Resp: 18  Temp: 98 F (36.7 C)  SpO2: 91%    Body mass index is 35.86 kg/m.  General: Alert, no acute distress Cardiovascular: No pedal edema Respiratory: No cyanosis, no use of accessory musculature Skin: No lesions in the area of chief complaint Neurologic: Sensation intact distally Psychiatric: Patient is competent for consent with normal mood and affect Lymphatic: No axillary or cervical lymphadenopathy  Assessment/Plan: SEVERE SPINAL STENOSIS L3-L5 with INSTABILITY AT L3/4 Plan for Procedure(s): LUMBAR 3 - LUMBAR 5 DECOMPRESSION, LUMBAR 3- LUMBAR 4 TRANSFORAMINAL LUMBAR INTERBODY FUSION WITH INSTRUMENTATION AND ALLOGRAFT   Norva Karvonen, MD 06/11/2020 8:05 AM

## 2020-06-11 NOTE — Transfer of Care (Signed)
Immediate Anesthesia Transfer of Care Note  Patient: Jeffrey Price  Procedure(s) Performed: LUMBAR 3 - LUMBAR 5 DECOMPRESSION, LUMBAR 3- LUMBAR 4 TRANSFORAMINAL LUMBAR INTERBODY FUSION WITH INSTRUMENTATION AND ALLOGRAFT (N/A )  Patient Location: PACU  Anesthesia Type:General  Level of Consciousness: drowsy  Airway & Oxygen Therapy: Patient Spontanous Breathing and Patient connected to face mask oxygen  Post-op Assessment: Report given to RN and Post -op Vital signs reviewed and stable  Post vital signs: Reviewed and stable  Last Vitals:  Vitals Value Taken Time  BP 146/80 06/11/20 1446  Temp    Pulse 81 06/11/20 1449  Resp 24 06/11/20 1449  SpO2 93 % 06/11/20 1449  Vitals shown include unvalidated device data.  Last Pain:  Vitals:   06/11/20 0739  TempSrc:   PainSc: 2          Complications: No complications documented.

## 2020-06-11 NOTE — Op Note (Signed)
PATIENT NAME: Jeffrey Price RECORD NO.:   [id number]   DATE OF BIRTH: [birth year]   DATE OF PROCEDURE: 06/11/2020                               OPERATIVE REPORT     PREOPERATIVE DIAGNOSES: 1. Severe spinal stenosis L3-4, L4-5 2. L3-4 instability, in the form of an L3-4 spondylolisthesis 3. Neurogenic claudication   POSTOPERATIVE DIAGNOSES: 1. Severe spinal stenosis L3-4, L4-5 2. L3-4 instability, in the form of an L3-4 spondylolisthesis 3. Neurogenic claudication   PROCEDURES: 1. L3/4, L4/5 decompression (2 levels) 2. Left-sided L3-4 transforaminal lumbar interbody fusion. 3. Right-sided L3-4 posterolateral fusion. 4. Insertion of interbody device x1 (59mm Concorde intervertebral spacer). 5. Placement of segmental posterior instrumentation L4, L5 bilaterally  6. Use of local autograft. 7. Use of morselized allograft - DBX-mix 8. Intraoperative use of fluoroscopy.   SURGEON:  Phylliss Bob, MD.   ASSISTANTPricilla Holm, PA-C.   ANESTHESIA:  General endotracheal anesthesia.   COMPLICATIONS:  None.   DISPOSITION:  Stable.   ESTIMATED BLOOD LOSS:  420cc, with 210cc retransfused   INDICATIONS FOR SURGERY:  Briefly, Jeffrey Price is a pleasant 64 year old male who did present to me with severe and ongoing pain in the bilateral legs. I did feel that the symptoms were secondary to the findings noted above. The patient failed conservative care and did wish to proceed with the procedure  noted above.   OPERATIVE DETAILS:  On 06/11/2020, the patient was brought to surgery and general endotracheal anesthesia was administered.  The patient was placed prone on a well-padded flat Jackson bed with a spinal frame.  Antibiotics were given and a time-out procedure was performed. The back was prepped and draped in the usual fashion.  A midline incision was made overlying the L3/4 and L4-5 intervertebral spaces.  The fascia was incised at the midline.  The paraspinal musculature  was bluntly swept laterally.  Anatomic landmarks for the pedicles were exposed. Using fluoroscopy, I did cannulate the L3 and L4 pedicles bilaterally, using a medial to lateral cortical trajectory technique.  At this point, 6 x 30 mm screws were placed into the right pedicles, and a 55 mm rod was placed into the tulip heads of the screw, and caps were also placed. Distraction was then applied across the L3-4 intervertebral space, and the caps were then provisionally tightened.  On the left side, bone wax was placed into the cannulated pedicle holes.  I then proceeded with the decompressive aspect of the procedure.  I started the decompression at the L4-5 level.  The L4 spinous process was removed.  As per the MRI, there was noted to be very severe spinal stenosis at this level.  I did very meticulously perform a bilateral partial facetectomy, removing ligamentum flavum hypertrophy, as well as facet hypertrophy, thereby decompressing the central spinal canal and right and left lateral recess.  I was very pleased with the final decompression that I was able to accomplish.  A similar decompression was performed at the L3-4 level.  I was very pleased with the decompression at both the L3-4 and L4-5 levels.  At this point, with an assistant holding medial retraction of the traversing left L4 nerve, I did perform an annulotomy at the posterolateral aspect of the L3-4 intervertebral space.  I then used a series of curettes and pituitary rongeurs to perform a thorough and complete intervertebral  diskectomy.  The intervertebral space was then liberally packed with autograft as well as allograft in the form of DBX-mix, as was the appropriate-sized intervertebral spacer (12 mm, parallel).  The spacer was then tamped into position in the usual fashion.  I was very pleased with the press-fit of the spacer.  I then placed 6 mm screws on the left at L3 and L4. A 45-mm rod was then placed and caps were placed. The  distraction was then released on the contralateral side.  All caps were then locked.  The wound was copiously irrigated with a total of approximately 3 L prior to placing the bone graft.  Additional autograft and allograft was then packed into the posterolateral gutter on the right side to help aid in the success of the fusion.  The wound was  explored for any undue bleeding and there was no substantial bleeding encountered.  Gel-Foam was placed over the laminectomy site.  The wound was then closed in layers using #1 Vicryl followed by 2-0 Vicryl, followed by 4-0 Monocryl.  Benzoin and Steri-Strips were applied followed by sterile dressing.  Of note, there was moderate wheezing noted throughout the surgery.  This was controlled somewhat at the termination of the procedure, using Surgi-Flo and bipolar electrocautery.  I did however elect to place a #15 deep Blake drain, deep to the fascia prior to closure.   Of note, did use triggered EMG to test the screws on the left, and there was  no screw the tested below 12 mA. There was no sustained abnormal EMG activity noted throughout the surgery.   Of note, [name] was my assistant throughout surgery, and did aid in retraction, suctioning, placement of the hardware, and closure.     Phylliss Bob, MD

## 2020-06-11 NOTE — Anesthesia Postprocedure Evaluation (Signed)
Anesthesia Post Note  Patient: Jeffrey Price  Procedure(s) Performed: LUMBAR 3 - LUMBAR 5 DECOMPRESSION, LUMBAR 3- LUMBAR 4 TRANSFORAMINAL LUMBAR INTERBODY FUSION WITH INSTRUMENTATION AND ALLOGRAFT (N/A )     Patient location during evaluation: PACU Anesthesia Type: General Level of consciousness: sedated and patient cooperative Pain management: pain level controlled Vital Signs Assessment: post-procedure vital signs reviewed and stable Respiratory status: spontaneous breathing Cardiovascular status: stable Anesthetic complications: no   No complications documented.  Last Vitals:  Vitals:   06/11/20 1545 06/11/20 1622  BP: 122/66 114/84  Pulse: 67 60  Resp: 14 18  Temp: 36.5 C (!) 36.4 C  SpO2: 91% 94%    Last Pain:  Vitals:   06/11/20 1640  TempSrc:   PainSc: [place]

## 2020-06-12 MED ORDER — OXYCODONE-ACETAMINOPHEN 5-325 MG PO TABS: 1.0000 | ORAL_TABLET | ORAL | 0 refills | Status: DC | PRN

## 2020-06-12 MED FILL — OXYCODONE-APAP 5-325MG: 5-325 | 5 days supply | Qty: 30 | Fill #0

## 2020-06-12 MED FILL — Thrombin (Recombinant) For Soln 20000 Unit: CUTANEOUS | Qty: 1 | Status: AC

## 2020-06-12 MED FILL — Sodium Chloride IV Soln 0.9%: INTRAVENOUS | Qty: 1000 | Status: AC

## 2020-06-12 MED FILL — Heparin Sodium (Porcine) Inj 1000 Unit/ML: INTRAMUSCULAR | Qty: 30 | Status: AC

## 2020-06-12 NOTE — Progress Notes (Signed)
Occupational Therapy Evaluation Patient Details Name: Jeffrey Price MRN: [id number] DOB: August 06, 1956 Today's Date: 06/12/2020    History of Present Illness 64 y.o. male presenting with severe stenosis L3-5 with instability at L3-4 s/p lumbar decompression L3-4 and TLIF with instrumentation and allograft by Dr. Lynann Bologna on 06/11/2020. PMHx significant for bulging lumbar disc, DDD, Graves disease, HLD, HTN, and peripheral neuropathy.    Clinical Impression   PTA, patient was living with his spouse in a private residence and was independent with BADLs/IADLs without AE/DME at baseline. Patient currently presents below baseline level of function requiring assist with LB BADLs for adherence to back precautions. Patient also limited by 9/10 pain in low back. Education provided on back precautions, energy conservation techniques, and compensatory strategies with use of AE/DME to increase independence with self-care tasks. Patient would benefit from continued acute OT services in prep for safe d/c home with wife who is able to provide supervision/assist as needed. No follow-up OT recommendations at this time.     Follow Up Recommendations  No OT follow up;Supervision/Assistance - 24 hour    Equipment Recommendations  None recommended by OT (Patient has necessary DME)    Recommendations for Other Services       Precautions / Restrictions Precautions Precautions: Back Precaution Booklet Issued: Yes (comment) Precaution Comments: reviewed with pt throughout Required Braces or Orthoses: Spinal Brace Spinal Brace: Thoracolumbosacral orthotic Restrictions Weight Bearing Restrictions: No      Mobility Bed Mobility               General bed mobility comments: Patient seated EOB upon entry.   Transfers Overall transfer level: Needs assistance Equipment used: Rolling walker (2 wheeled) Transfers: Sit to/from Stand Sit to Stand: Min guard         General transfer comment: increased time  and effort, cueing to continue to breathe throughout    Balance Overall balance assessment: Needs assistance Sitting-balance support: Feet supported Sitting balance-Leahy Scale: Fair     Standing balance support: No upper extremity supported Standing balance-Leahy Scale: Fair                             ADL either performed or assessed with clinical judgement   ADL Overall ADL's : Needs assistance/impaired     Grooming: Set up;Standing   Upper Body Bathing: Set up;Sitting   Lower Body Bathing: Minimal assistance;Sit to/from stand;Sitting/lateral leans   Upper Body Dressing : Set up;Sitting Upper Body Dressing Details (indicate cue type and reason): To don shirt seated EOB Lower Body Dressing: Minimal assistance;Sitting/lateral leans;Sit to/from stand Lower Body Dressing Details (indicate cue type and reason): To don pants, socks/shoes seated EOB with use of reacher and sock-aid.  Toilet Transfer: Min Conservation officer, nature and Hygiene: Supervision/safety;Sitting/lateral lean;Sit to/from stand       Functional mobility during ADLs: Min guard;Rolling walker       Vision   Vision Assessment?: No apparent visual deficits     Perception     Praxis      Pertinent Vitals/Pain Pain Assessment: 0-10 Pain Score: 9  Faces Pain Scale: Hurts little more Pain Location: back Pain Descriptors / Indicators: Sore;Grimacing;Guarding Pain Intervention(s): Monitored during session     Hand Dominance     Extremity/Trunk Assessment Upper Extremity Assessment Upper Extremity Assessment: Overall WFL for tasks assessed   Lower Extremity Assessment Lower Extremity Assessment: Overall WFL for tasks assessed   Cervical / Trunk Assessment  Cervical / Trunk Assessment: Other exceptions Cervical / Trunk Exceptions: s/p lumbar sx   Communication Communication Communication: No difficulties   Cognition Arousal/Alertness: Awake/alert Behavior  During Therapy: WFL for tasks assessed/performed Overall Cognitive Status: Within Functional Limits for tasks assessed                                     General Comments       Exercises     Shoulder Instructions      Home Living Family/patient expects to be discharged to:: Private residence Living Arrangements: Spouse/significant other Available Help at Discharge: Family;Available 24 hours/day Type of Home: House Home Access: Stairs to enter [employer] of Steps: 2 Entrance Stairs-Rails: None Home Layout: One level     Bathroom Shower/Tub: Teacher, early years/pre: Handicapped height     Home Equipment: Environmental consultant - 2 wheels;Cane - single point;Bedside commode;Wheelchair - manual          Prior Functioning/Environment Level of Independence: Independent        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 Additional ADL Goal #2: Patient will recall and demonstrate 3 energy conservation techniques in prep for BADLs.  OT Frequency: Min 2X/week   Barriers to D/C:            Co-evaluation              AM-PAC OT "6 Clicks" Daily Activity     Outcome Measure Help from another person eating meals?: None Help from another person taking care of personal grooming?: A Little Help from another person toileting, which includes using toliet, bedpan, or urinal?: A  Little Help from another person bathing (including washing, rinsing, drying)?: A Little Help from another person to put on and taking off regular upper body clothing?: A Little Help from another person to put on and taking off regular lower body clothing?: A Little 6 Click Score: 19   End of Session Equipment Utilized During Treatment: Rolling walker  Activity Tolerance: Patient limited by pain Patient left: in bed;with call bell/phone within reach;with family/visitor present  OT Visit Diagnosis: Unsteadiness on feet (R26.81);Pain Pain - part of body:  (Low back and surgical site)                Time: 7948-0165 OT Time Calculation (min): 30 min Charges:  OT General Charges $OT Visit: 1 Visit OT Evaluation $OT Eval Low Complexity: 1 Low OT Treatments $Self Care/Home Management : 8-22 mins  Halen Mossbarger H. OTR/L Supplemental OT, Department of rehab services [phone]  Trude Cansler R H. 06/12/2020, 10:56 AM

## 2020-06-12 NOTE — Progress Notes (Signed)
    Patient doing well Moderate LBP Patient denies leg pain   Physical Exam: Vitals:   06/11/20 2312 06/12/20 0334  BP: 119/66 115/68  Pulse: 69 81  Resp: 18 20  Temp: 99.2 F (37.3 C) 98.9 F (37.2 C)  SpO2: 91% 92%    Dressing in place NVI  Drain output high at 200/10 hours  POD #1 s/p L3-5 decompression and L3/4 fusion, doing well  - up with PT/OT, encourage ambulation - Percocet for pain, Robaxin for muscle spasms - likely d/c home today with f/u in 2 weeks without without drain, depending on drain output

## 2020-06-12 NOTE — Evaluation (Signed)
Physical Therapy Evaluation Patient Details Name: Jeffrey Price MRN: [id number] DOB: 1955-11-17 Today's Date: 06/12/2020   History of Present Illness  Pt is a 64 y/o male s/p L TLIF L3-4 and R L3-4 posterolateral fusion. PMH including but not limited to HTN, neuropathy and Graves disease.    Clinical Impression  Pt presented standing in room exiting bathroom with significant other present and willing to participate in therapy session. Prior to admission, pt reported that he was independent with all functional mobility and ADLs. Pt lives with his significant other in a single level home with a couple of steps to enter. At the time of evaluation, pt overall at a min guard to supervision level with functional mobility with use of RW for hallway ambulation. Pt with very slow, cautious and guarded gait with and without use of RW. PT provided pt education re: back precautions with handout provided and a generalized walking program for pt to initiate upon d/c home. PT will continue to f/u with pt acutely to progress mobility as tolerated per PT POC.    Follow Up Recommendations No PT follow up    Equipment Recommendations  None recommended by PT    Recommendations for Other Services       Precautions / Restrictions Precautions Precautions: Back Precaution Booklet Issued: Yes (comment) Precaution Comments: reviewed with pt throughout Required Braces or Orthoses: Spinal Brace Spinal Brace: Thoracolumbosacral orthotic Restrictions Weight Bearing Restrictions: No      Mobility  Bed Mobility               General bed mobility comments: pt standing in room with significant other present upon arrival; remained sitting EOB with OT present at end of session  Transfers Overall transfer level: Needs assistance Equipment used: Rolling walker (2 wheeled) Transfers: Sit to/from Stand Sit to Stand: Min guard         General transfer comment: increased time and effort, cueing to continue  to breathe throughout  Ambulation/Gait Ambulation/Gait assistance: Min guard;Supervision Gait Distance (Feet): 150 Feet Assistive device: Rolling walker (2 wheeled);None Gait Pattern/deviations: Step-through pattern;Decreased stride length Gait velocity: greatly decreased   General Gait Details: pt with mild instability with use of RW, no overt LOB or need for physical assistance; however, pt with signficantly reduced gait speed. Pt ambulating ~15' without an AD, much more guarded but no LOB  Stairs            Wheelchair Mobility    Modified Rankin (Stroke Patients Only)       Balance Overall balance assessment: Needs assistance Sitting-balance support: Feet supported Sitting balance-Leahy Scale: Fair     Standing balance support: No upper extremity supported Standing balance-Leahy Scale: Fair                               Pertinent Vitals/Pain Pain Assessment: Faces Faces Pain Scale: Hurts little more Pain Location: back Pain Descriptors / Indicators: Sore Pain Intervention(s): Monitored during session;Repositioned    Home Living Family/patient expects to be discharged to:: Private residence Living Arrangements: Spouse/significant other Available Help at Discharge: Family;Available 24 hours/day Type of Home: House Home Access: Stairs to enter Entrance Stairs-Rails: None Entrance Stairs-Number of Steps: 2 Home Layout: One level Home Equipment: Walker - 2 wheels;Cane - single point;Bedside commode;Wheelchair - manual      Prior Function Level of Independence: Independent         Comments: works as a Administrator  Hand Dominance        Extremity/Trunk Assessment   Upper Extremity Assessment Upper Extremity Assessment: Defer to OT evaluation;Overall WFL for tasks assessed    Lower Extremity Assessment Lower Extremity Assessment: Overall WFL for tasks assessed    Cervical / Trunk Assessment Cervical / Trunk Assessment: Other  exceptions Cervical / Trunk Exceptions: s/p lumbar sx  Communication   Communication: No difficulties  Cognition Arousal/Alertness: Awake/alert Behavior During Therapy: WFL for tasks assessed/performed Overall Cognitive Status: Within Functional Limits for tasks assessed                                        General Comments      Exercises     Assessment/Plan    PT Assessment Patient needs continued PT services  PT Problem List Decreased balance;Decreased mobility;Decreased coordination;Decreased knowledge of use of DME;Decreased safety awareness;Decreased knowledge of precautions;Pain       PT Treatment Interventions DME instruction;Gait training;Stair training;Therapeutic activities;Therapeutic exercise;Functional mobility training;Balance training;Neuromuscular re-education;Patient/family education    PT Goals (Current goals can be found in the Care Plan section)  Acute Rehab PT Goals Patient Stated Goal: decrease pain PT Goal Formulation: With patient Time For Goal Achievement: 06/26/20 Potential to Achieve Goals: Good    Frequency Min 5X/week   Barriers to discharge        Co-evaluation               AM-PAC PT "6 Clicks" Mobility  Outcome Measure Help needed turning from your back to your side while in a flat bed without using bedrails?: None Help needed moving from lying on your back to sitting on the side of a flat bed without using bedrails?: None Help needed moving to and from a bed to a chair (including a wheelchair)?: None Help needed standing up from a chair using your arms (e.g., wheelchair or bedside chair)?: None Help needed to walk in hospital room?: None Help needed climbing 3-5 steps with a railing? : A Little 6 Click Score: 23    End of Session Equipment Utilized During Treatment: Gait belt;Back brace Activity Tolerance: Patient tolerated treatment well Patient left: in bed;with call bell/phone within reach;with  family/visitor present;Other (comment) (seated EOB with OT present to begin her session) Nurse Communication: Mobility status PT Visit Diagnosis: Other abnormalities of gait and mobility (R26.89);Pain Pain - part of body:  (back)    Time: 9292-4462 PT Time Calculation (min) (ACUTE ONLY): 28 min   Charges:   PT Evaluation $PT Eval Moderate Complexity: 1 Mod PT Treatments $Gait Training: 8-22 mins        Anastasio Champion, DPT  Acute Rehabilitation Services Pager [phone] Office [place] 06/12/2020, 9:11 AM

## 2020-06-13 LAB — GLUCOSE, CAPILLARY: Glucose-Capillary: 230 mg/dL — ABNORMAL HIGH (ref 70–99)

## 2020-06-13 NOTE — Progress Notes (Signed)
Physical Therapy Treatment Patient Details Name: Jeffrey Price MRN: [id number] DOB: 1956/01/29 Today's Date: 06/13/2020    History of Present Illness 64 y.o. male presenting with severe stenosis L3-5 with instability at L3-4 s/p lumbar decompression L3-4 and TLIF with instrumentation and allograft by Dr. Lynann Bologna on 06/11/2020. PMHx significant for bulging lumbar disc, DDD, Graves disease, HLD, HTN, and peripheral neuropathy.     PT Comments    Pt progressing well with post-op mobility. He was able to demonstrate bed mobility with up to min assist, but min guard assist to supervision for safety for ambulation and RW for support. Pt was educated on precautions, brace application/wearing schedule, appropriate activity progression, and car transfer. Brace adjusted for optimal fit. Will continue to follow.      Follow Up Recommendations  No PT follow up     Equipment Recommendations  Bariatric RW   Recommendations for Other Services       Precautions / Restrictions Precautions Precautions: Back Precaution Booklet Issued: Yes (comment) Precaution Comments: reviewed with pt throughout Required Braces or Orthoses: Spinal Brace Spinal Brace: Thoracolumbosacral orthotic Restrictions Weight Bearing Restrictions: No    Mobility  Bed Mobility Overal bed mobility: Needs Assistance Bed Mobility: Sit to Sidelying         Sit to sidelying: Min assist General bed mobility comments: HOB flat and rails lowered to simulate home environment. Min assist to elevate BLE's up onto bed. Increased time/effort to scoot back in the bed.   Transfers Overall transfer level: Needs assistance Equipment used: Rolling walker (2 wheeled) Transfers: Sit to/from Stand Sit to Stand: Min guard;Min assist         General transfer comment: Initially required min assist. Min guard assist after. VC's for hand placement on seated surface for safety.   Ambulation/Gait Ambulation/Gait assistance: Min  guard;Supervision Gait Distance (Feet): 200 Feet Assistive device: Rolling walker (2 wheeled) Gait Pattern/deviations: Step-through pattern;Decreased stride length;Trunk flexed Gait velocity: greatly decreased Gait velocity interpretation: <1.31 ft/sec, indicative of household ambulator General Gait Details: VC's for improved posture and fluidity of walker movement. Pt trialed the standard RW and the bariatric. Pt more comfortable with the bariatric size.    Stairs Stairs: Yes Stairs assistance: Min guard Stair Management: Two rails;Step to pattern;Forwards Number of Stairs: 1 General stair comments: At home, pt will be able to bring RW all the way up onto the landing before negotiating 1 step. Pt was educated on proper sequencing and safety. Practiced with B railings this session.   Wheelchair Mobility    Modified Rankin (Stroke Patients Only)       Balance Overall balance assessment: Needs assistance Sitting-balance support: Feet supported Sitting balance-Leahy Scale: Fair     Standing balance support: No upper extremity supported Standing balance-Leahy Scale: Fair (statically)                              Cognition Arousal/Alertness: Awake/alert Behavior During Therapy: WFL for tasks assessed/performed Overall Cognitive Status: Within Functional Limits for tasks assessed                                        Exercises      General Comments        Pertinent Vitals/Pain Pain Assessment: Faces Faces Pain Scale: Hurts even more Pain Location: back Pain Descriptors / Indicators: Sore;Grimacing;Guarding Pain Intervention(s): Limited  activity within patient's tolerance;Monitored during session;Repositioned    Home Living                      Prior Function            PT Goals (current goals can now be found in the care plan section) Acute Rehab PT Goals Patient Stated Goal: decrease pain PT Goal Formulation: With  patient Time For Goal Achievement: 06/26/20 Potential to Achieve Goals: Good Progress towards PT goals: Progressing toward goals    Frequency    Min 5X/week      PT Plan Current plan remains appropriate    Co-evaluation              AM-PAC PT "6 Clicks" Mobility   Outcome Measure  Help needed turning from your back to your side while in a flat bed without using bedrails?: None Help needed moving from lying on your back to sitting on the side of a flat bed without using bedrails?: None Help needed moving to and from a bed to a chair (including a wheelchair)?: None Help needed standing up from a chair using your arms (e.g., wheelchair or bedside chair)?: None Help needed to walk in hospital room?: A Little Help needed climbing 3-5 steps with a railing? : A Little 6 Click Score: 22    End of Session Equipment Utilized During Treatment: Gait belt;Back brace Activity Tolerance: Patient tolerated treatment well Patient left: in bed;with call bell/phone within reach;with family/visitor present Nurse Communication: Mobility status PT Visit Diagnosis: Other abnormalities of gait and mobility (R26.89);Pain Pain - part of body:  (back)     Time: 0750-0829 PT Time Calculation (min) (ACUTE ONLY): 39 min  Charges:  $Gait Training: 23-37 mins $Therapeutic Activity: 8-22 mins                     Rolinda Roan, PT, DPT Acute Rehabilitation Services Pager: [phone] Office: [phone]    Thelma Comp 06/13/2020, 8:51 AM

## 2020-06-13 NOTE — Progress Notes (Signed)
Occupational Therapy Treatment Patient Details Name: Jeffrey Price MRN: [id number] DOB: Jan 17, 1956 Today's Date: 06/13/2020    History of present illness 64 y.o. male presenting with severe stenosis L3-5 with instability at L3-4 s/p lumbar decompression L3-4 and TLIF with instrumentation and allograft by Dr. Lynann Bologna on 06/11/2020. PMHx significant for bulging lumbar disc, DDD, Graves disease, HLD, HTN, and peripheral neuropathy.    OT comments  Pt. Seen for skilled OT session. Pts. Girlfriend present and active in session. Both report she will be assisting upon d/c home.  Pt. And girlfriend able to demonstrate proper placement and donning of brace.  Pt. Min guard for ambulation to/from b.room.  Girlfriend able to guide and remind pt. Of safety and back precautions.  Reviewed energy conservation strategies during LB adls.  Both had no further questions and comfortable with d/c home soon.   Follow Up Recommendations  No OT follow up;Supervision/Assistance - 24 hour    Equipment Recommendations  None recommended by OT    Recommendations for Other Services      Precautions / Restrictions Precautions Precautions: Back Precaution Booklet Issued: Yes (comment) Precaution Comments: reviewed with pt throughout Required Braces or Orthoses: Spinal Brace Spinal Brace: Thoracolumbosacral orthotic Restrictions Weight Bearing Restrictions: No       Mobility Bed Mobility Overal bed mobility: Needs Assistance Bed Mobility: Sit to Sidelying         Sit to sidelying: Min assist General bed mobility comments: seated eob upon arrival  Transfers Overall transfer level: Needs assistance Equipment used: Rolling walker (2 wheeled) Transfers: Sit to/from [facility] Sit to Stand: Min guard;Min assist Stand pivot transfers: Min guard       General transfer comment: Initially required min assist. Min guard assist after. VC's for hand placement on seated surface for safety.      Balance Overall balance assessment: Needs assistance Sitting-balance support: Feet supported Sitting balance-Leahy Scale: Fair     Standing balance support: No upper extremity supported Standing balance-Leahy Scale: Fair (statically)                             ADL either performed or assessed with clinical judgement   ADL Overall ADL's : Needs assistance/impaired             Lower Body Bathing: Cueing for safety;Cueing for sequencing;Cueing for compensatory techniques Lower Body Bathing Details (indicate cue type and reason): educated pt. and pts. girlfriend on tech. for LB dressing as they both report she will be assisting with LB adls at home Upper Body Dressing : Minimal assistance;Sitting Upper Body Dressing Details (indicate cue type and reason): girlfriend assisted with set up of brace and gave step by step instructions for pt. on how to fasten and buckle it   Lower Body Dressing Details (indicate cue type and reason): educated pt. and pts. girlfriend on tech. for LB dressing as they both report she will be assisting with LB adls at home Toilet Transfer: Min guard;Grab bars;Ambulation;RW;Comfort height toilet   Toileting- Clothing Manipulation and Hygiene: Supervision/safety;Sitting/lateral lean;Sit to/from Nurse, children's Details (indicate cue type and reason): report they will sponge bathe initially but state they will use 3n1 for him to sit on and have a hand held shower wand he will use to reach LB during bathing Functional mobility during ADLs: Min guard;Rolling walker General ADL Comments: cues for rw management and not to twist during turn in b.room.  reviewed  energy conservation strategies for pt. and girlfriend during LB adls to limit number of sit/stands     Vision       Perception     Praxis      Cognition Arousal/Alertness: Awake/alert Behavior During Therapy: WFL for tasks assessed/performed Overall Cognitive Status: Within  Functional Limits for tasks assessed                                          Exercises     Shoulder Instructions       General Comments      Pertinent Vitals/ Pain       Pain Assessment: 0-10 Pain Score: 9  Faces Pain Scale: Hurts even more Pain Location: back Pain Descriptors / Indicators: Sore;Grimacing;Guarding Pain Intervention(s): Limited activity within patient's tolerance;Monitored during session;Repositioned  Home Living                                          Prior Functioning/Environment              Frequency  Min 2X/week        Progress Toward Goals  OT Goals(current goals can now be found in the care plan section)     Acute Rehab OT Goals Patient Stated Goal: decrease pain  Plan Discharge plan remains appropriate    Co-evaluation                 AM-PAC OT "6 Clicks" Daily Activity     Outcome Measure   Help from another person eating meals?: None Help from another person taking care of personal grooming?: A Little Help from another person toileting, which includes using toliet, bedpan, or urinal?: A Little Help from another person bathing (including washing, rinsing, drying)?: A Little Help from another person to put on and taking off regular upper body clothing?: A Little Help from another person to put on and taking off regular lower body clothing?: A Little 6 Click Score: 19    End of Session Equipment Utilized During Treatment: Rolling walker;Back brace  OT Visit Diagnosis: Unsteadiness on feet (R26.81);Pain   Activity Tolerance Patient limited by pain   Patient Left Other (comment) (left in b.room with girlfriend present, states she is comfortable assisting him out of the b.room. instructed to pull string for assistance if needed after he was finished using the b.room)   Nurse Communication          Time: [id number] OT Time Calculation (min): 13 min  Charges: OT General  Charges $OT Visit: 1 Visit OT Treatments $Self Care/Home Management : 8-22 mins  Sonia Baller, COTA/L Acute Rehabilitation [phone]   Janice Coffin 06/13/2020, 9:45 AM

## 2020-06-13 NOTE — Plan of Care (Signed)
Patient alert and oriented, mae's well, voiding adequate amount of urine, swallowing without difficulty, no c/o pain at time of discharge. Patient discharged home with family. Script and discharged instructions given to patient. Patient and family stated understanding of instructions given. Patient has an appointment with Dr. Dumonski 

## 2020-06-13 NOTE — Progress Notes (Signed)
° ° °  Patient doing well Reports resolution of preoperative leg pain Currently up with PT   Physical Exam: Vitals:   06/13/20 0003 06/13/20 0247  BP: 108/60 110/66  Pulse: 77 75  Resp: 20 20  Temp: 98.5 F (36.9 C) 98 F (36.7 C)  SpO2: 98% 96%    Dressing in place NVI  Drain output continues to slow (50cc/8 hours)  POD #2 s/p lumbar decompression and fusion  - up with PT/OT, encourage ambulation - Percocet for pain, Robaxin for muscle spasms - likely d/c home today with f/u in 2 weeks - will remove drain upon discharge

## 2020-06-17 MED FILL — PREGABALIN 200 MG CAPS: 200 | 90 days supply | Qty: 180 | Fill #1

## 2020-06-18 ENCOUNTER — Encounter (HOSPITAL_COMMUNITY): Payer: Self-pay | Admitting: Orthopedic Surgery

## 2020-06-18 NOTE — Discharge Summary (Signed)
Patient ID: Jeffrey Price MRN: [id number] DOB/AGE: [birth year] 64 y.o.  Admit date: 06/11/2020 Discharge date: 06/13/2020  Admission Diagnoses:  Active Problems:   Spinal stenosis   Discharge Diagnoses:  Same  Past Medical History:  Diagnosis Date   Arthritis    in back   Blood clot in vein 1995   bil legs   Bulging lumbar disc    Cataract    mild   Cellulitis 2012   forehead   DDD (degenerative disc disease), lumbar    GERD (gastroesophageal reflux disease)    Graves disease    Hx of adenomatous colonic polyps 12/15/2017   Hyperlipidemia    Hypertension    Neuromuscular disorder (HCC)    neuropathy in feet   Pre-diabetes    Thyrotoxicosis without thyroid storm    Varicose veins of both lower extremities     Surgeries: Procedure(s): LUMBAR 3 - LUMBAR 5 DECOMPRESSION, LUMBAR 3- LUMBAR 4 TRANSFORAMINAL LUMBAR INTERBODY FUSION WITH INSTRUMENTATION AND ALLOGRAFT on 06/11/2020   Consultants: None  Discharged Condition: Improved  Hospital Course: Jeffrey Price is an 64 y.o. male who was admitted 06/11/2020 for operative treatment of radiculopathy. Patient has severe unremitting pain that affects sleep, daily activities, and work/hobbies. After pre-op clearance the patient was taken to the operating room on 06/11/2020 and underwent  Procedure(s): LUMBAR 3 - LUMBAR 5 DECOMPRESSION, LUMBAR 3- LUMBAR 4 TRANSFORAMINAL LUMBAR INTERBODY FUSION WITH INSTRUMENTATION AND ALLOGRAFT.    Patient was given perioperative antibiotics:  Anti-infectives (From admission, onward)   Start     Dose/Rate Route Frequency Ordered Stop   06/11/20 2030  ceFAZolin (ANCEF) IVPB 2g/100 mL premix        2 g 200 mL/hr over 30 Minutes Intravenous Every 8 hours 06/11/20 1603 06/12/20 0443   06/11/20 1615  doxycycline (VIBRA-TABS) tablet 100 mg  Status:  Discontinued        100 mg Oral Daily 06/11/20 1603 06/13/20 2054   06/11/20 0600  ceFAZolin (ANCEF) 3 g in dextrose 5 % 50 mL IVPB         3 g 100 mL/hr over 30 Minutes Intravenous On call to O.R. 06/10/20 1228 06/11/20 1240       Patient was given sequential compression devices, early ambulation to prevent DVT.  Patient benefited maximally from hospital stay and there were no complications.    Recent vital signs: BP 116/67 (BP Location: Right Arm)    Pulse 80    Temp 99 F (37.2 C) (Oral) Comment: pt.had hot coffee   Resp 18    Ht 6\' 2"  (1.88 m)    Wt 126.7 kg    SpO2 92%    BMI 35.86 kg/m    Discharge Medications:   Allergies as of 06/13/2020   No Known Allergies     Medication List    STOP taking these medications   Tylenol 325 MG tablet Generic drug: acetaminophen     TAKE these medications   atenolol 100 MG tablet Commonly known as: TENORMIN TAKE 1 TABLET (100 MG TOTAL) BY MOUTH DAILY.   atorvastatin 20 MG tablet Commonly known as: LIPITOR TAKE 1 TABLET BY MOUTH ONCE DAILY **NEEDS OFFICE VISIT FOR FURTHER REFILLS**   BD Eclipse Needle 22G X 1-1/2" Misc Generic drug: NEEDLE (DISP) 22 G Use as directed   cyclobenzaprine 10 MG tablet Commonly known as: FLEXERIL Take 1 tablet (10 mg total) by mouth 3 (three) times daily as needed for muscle spasms.   doxycycline 100  MG tablet Commonly known as: VIBRA-TABS TAKE 1 TABLET BY MOUTH ONCE DAILY (NEED LAB APPT) What changed: See the new instructions.   furosemide 40 MG tablet Commonly known as: LASIX TAKE 1 TABLET BY MOUTH ONCE DAILY   methimazole 5 MG tablet Commonly known as: TAPAZOLE TAKE 1 TABLET (5 MG) BY MOUTH DAILY IN THE MORNING What changed: See the new instructions.   methocarbamol 500 MG tablet Commonly known as: ROBAXIN Take 500 mg by mouth every 8 (eight) hours as needed for muscle spasms.   metroNIDAZOLE 1 % gel Commonly known as: Metrogel Apply topically daily. What changed:   how much to take  when to take this  reasons to take this   oxyCODONE-acetaminophen 5-325 MG tablet Commonly known as: PERCOCET/ROXICET Take 1-2  tablets by mouth every 4 (four) hours as needed for moderate pain or severe pain.   pantoprazole 40 MG tablet Commonly known as: PROTONIX TAKE 1 TABLET BY MOUTH ONCE DAILY (NEED LAB APPT) What changed: See the new instructions.   pregabalin 200 MG capsule Commonly known as: LYRICA TAKE 1 CAPSULE BY MOUTH TWICE DAILY. DO NOT TAKE GABAPENTIN WHILE TAKING THIS MEDICATION What changed:   how much to take  how to take this  when to take this   sildenafil 20 MG tablet Commonly known as: REVATIO TAKE 2 TO 5 TABLETS BY MOUTH PRIOR TO INTERCOURSE What changed: See the new instructions.   Syringe (Disposable) 3 ML Misc Use as directed   testosterone cypionate 200 MG/ML injection Commonly known as: DEPOTESTOSTERONE CYPIONATE INJECT 1 ML INTO THE MUSCLE EVERY 14 DAYS What changed:   how much to take  how to take this  when to take this   triamcinolone cream 0.1 % Commonly known as: KENALOG Apply 1 application topically 2 (two) times daily. What changed:   when to take this  reasons to take this       Diagnostic Studies: DG Lumbar Spine 2-3 Views  Result Date: 06/11/2020 CLINICAL DATA:  Lumbar discectomy EXAM: LUMBAR SPINE - 2-3 VIEW; DG C-ARM 1-60 MIN COMPARISON:  Previous intraoperative film FLUOROSCOPY TIME:  Radiation Exposure Index (as provided by the fluoroscopic device): 45 mGy If the device does not provide the exposure index: Fluoroscopy Time:  44 seconds Number of Acquired Images:  2 FINDINGS: Interbody fusion at L3-4 is noted. Pedicle screws and posterior fixation is seen. IMPRESSION: L3-4 lumbar fusion. Electronically Signed   By: Inez Catalina M.D.   On: 06/11/2020 14:12   DG Lumbar Spine 1 View  Result Date: 06/11/2020 CLINICAL DATA:  Intraoperative localization for discectomy EXAM: LUMBAR SPINE - 1 VIEW COMPARISON:  01/26/2019 FINDINGS: Lateral radiograph of the lumbar spine was obtained intraoperatively. Needle is noted in the posterior soft tissues at the L4-5  interspace. A second needle is noted inferiorly posterior to S1 vertebral body IMPRESSION: Intraoperative localization as described. Electronically Signed   By: Inez Catalina M.D.   On: 06/11/2020 14:12   DG C-Arm 1-60 Min  Result Date: 06/11/2020 CLINICAL DATA:  Lumbar discectomy EXAM: LUMBAR SPINE - 2-3 VIEW; DG C-ARM 1-60 MIN COMPARISON:  Previous intraoperative film FLUOROSCOPY TIME:  Radiation Exposure Index (as provided by the fluoroscopic device): 45 mGy If the device does not provide the exposure index: Fluoroscopy Time:  44 seconds Number of Acquired Images:  2 FINDINGS: Interbody fusion at L3-4 is noted. Pedicle screws and posterior fixation is seen. IMPRESSION: L3-4 lumbar fusion. Electronically Signed   By: Linus Mako.D.  On: 06/11/2020 14:12    Disposition: Discharge disposition: 01-Home or Self Care        POD #2 s/p lumbar decompression and fusion  - up with PT/OT, encourage ambulation - Percocet for pain, Robaxin for muscle spasms -Scripts for pain sent to pharmacy electronically  -D/C instructions sheet printed and in chart -D/C today  -F/U in office 2 weeks   Signed: Justice Britain 06/18/2020, 11:38 AM

## 2020-06-19 MED FILL — METHOCARBAMOL 500 MG TABS: 500 | 15 days supply | Qty: 60 | Fill #0

## 2020-06-19 MED FILL — OXYCODONE-APAP 5-325MG: 5-325 | 5 days supply | Qty: 50 | Fill #0

## 2020-06-25 ENCOUNTER — Other Ambulatory Visit: Payer: Self-pay | Admitting: Family Medicine

## 2020-06-25 DIAGNOSIS — K219 Gastro-esophageal reflux disease without esophagitis: Secondary | ICD-10-CM

## 2020-06-25 MED FILL — PANTOPRAZOLE SOD DR 40 MG T: 40 | 90 days supply | Qty: 90 | Fill #0

## 2020-07-09 ENCOUNTER — Other Ambulatory Visit: Payer: Self-pay | Admitting: Family Medicine

## 2020-07-09 DIAGNOSIS — K219 Gastro-esophageal reflux disease without esophagitis: Secondary | ICD-10-CM

## 2020-07-09 MED FILL — OXYCODONE-APAP 5-325MG: 5-325 | 5 days supply | Qty: 50 | Fill #0

## 2020-07-11 MED FILL — METHOCARBAMOL 500 MG TABS: 500 | 15 days supply | Qty: 60 | Fill #1

## 2020-07-17 ENCOUNTER — Encounter: Payer: Self-pay | Admitting: Family Medicine

## 2020-07-17 ENCOUNTER — Telehealth: Payer: Self-pay | Admitting: Family Medicine

## 2020-07-17 DIAGNOSIS — E0500 Thyrotoxicosis with diffuse goiter without thyrotoxic crisis or storm: Secondary | ICD-10-CM

## 2020-07-17 MED ORDER — METHIMAZOLE 5 MG PO TABS: 5.0000 mg | ORAL_TABLET | Freq: Every day | ORAL | 0 refills | Status: DC

## 2020-07-17 MED FILL — methIMAzole 5 MG TABS: 5 | 90 days supply | Qty: 90 | Fill #0

## 2020-07-17 NOTE — Telephone Encounter (Signed)
Pt is calling to have his medication  methimazole (TAPAZOLE) 5 MG tablet   Refilled. I told pt he needed an OV or mychart visit for refills. He said he will try another Dr. To refill it.

## 2020-07-17 NOTE — Telephone Encounter (Signed)
Rx sent with note stating the pt needs an appt.

## 2020-07-24 ENCOUNTER — Other Ambulatory Visit: Payer: Self-pay | Admitting: Family Medicine

## 2020-07-24 DIAGNOSIS — I10 Essential (primary) hypertension: Secondary | ICD-10-CM

## 2020-07-24 MED FILL — ATENOLOL 100 MG TABLET: 100 | 90 days supply | Qty: 90 | Fill #0

## 2020-07-25 ENCOUNTER — Ambulatory Visit (HOSPITAL_COMMUNITY)
Admission: RE | Admit: 2020-07-25 | Discharge: 2020-07-25 | Disposition: A | Discharge status: Home / Self Care | Payer: No Typology Code available for payment source | Source: Ambulatory Visit | Attending: Vascular Surgery | Admitting: Vascular Surgery

## 2020-07-25 ENCOUNTER — Other Ambulatory Visit: Payer: Self-pay

## 2020-07-25 ENCOUNTER — Other Ambulatory Visit (HOSPITAL_COMMUNITY): Payer: Self-pay | Admitting: Orthopedic Surgery

## 2020-07-25 DIAGNOSIS — R52 Pain, unspecified: Secondary | ICD-10-CM | POA: Insufficient documentation

## 2020-07-25 MED FILL — OXYCODONE-APAP 5-325MG: 5-325 | 5 days supply | Qty: 30 | Fill #0

## 2020-08-05 ENCOUNTER — Other Ambulatory Visit: Payer: Self-pay | Admitting: Cardiovascular Disease

## 2020-08-05 MED FILL — FUROSEMIDE 40 MG TAB: 40 | 90 days supply | Qty: 90 | Fill #0

## 2020-08-08 ENCOUNTER — Ambulatory Visit (INDEPENDENT_AMBULATORY_CARE_PROVIDER_SITE_OTHER): Payer: No Typology Code available for payment source

## 2020-08-08 ENCOUNTER — Other Ambulatory Visit: Payer: Self-pay | Admitting: Family Medicine

## 2020-08-08 ENCOUNTER — Encounter: Payer: Self-pay | Admitting: Family Medicine

## 2020-08-08 ENCOUNTER — Ambulatory Visit (INDEPENDENT_AMBULATORY_CARE_PROVIDER_SITE_OTHER): Payer: No Typology Code available for payment source | Admitting: Family Medicine

## 2020-08-08 ENCOUNTER — Other Ambulatory Visit: Payer: Self-pay

## 2020-08-08 VITALS — BP 118/90 | HR 60 | Temp 97.9°F | Ht 74.0 in | Wt 282.3 lb

## 2020-08-08 DIAGNOSIS — R609 Edema, unspecified: Secondary | ICD-10-CM

## 2020-08-08 DIAGNOSIS — R7989 Other specified abnormal findings of blood chemistry: Secondary | ICD-10-CM | POA: Diagnosis not present

## 2020-08-08 DIAGNOSIS — E1142 Type 2 diabetes mellitus with diabetic polyneuropathy: Secondary | ICD-10-CM

## 2020-08-08 DIAGNOSIS — I10 Essential (primary) hypertension: Secondary | ICD-10-CM

## 2020-08-08 DIAGNOSIS — Z23 Encounter for immunization: Secondary | ICD-10-CM | POA: Diagnosis not present

## 2020-08-08 DIAGNOSIS — E0500 Thyrotoxicosis with diffuse goiter without thyrotoxic crisis or storm: Secondary | ICD-10-CM | POA: Diagnosis not present

## 2020-08-08 DIAGNOSIS — R7981 Abnormal blood-gas level: Secondary | ICD-10-CM

## 2020-08-08 DIAGNOSIS — E785 Hyperlipidemia, unspecified: Secondary | ICD-10-CM

## 2020-08-08 MED FILL — ATORVASTATIN CALCIUM 20 MG: 20 | 90 days supply | Qty: 90 | Fill #0

## 2020-08-08 NOTE — Progress Notes (Signed)
Jeffrey Price DOB: 18-Aug-1956 Encounter date: 08/08/2020  This is a 64 y.o. male who presents with Chief Complaint  Patient presents with   Leg Swelling    patient complains of bilateral LE swelling xmonths, since back surgery in August, seen by vein specialists 2 weeks ago, no DVY noted and seen by Dr Johnsie Cancel with negative report   Follow-up    requests lab testing for thyroid and testosterone    History of present illness: On 06/18/2020 he underwent a lumbar decompression and fusion. He states that back is hurting like crazy. Hard to get up/get out. He is out of work now.  He did have some vein stripping in bilat lower legs.  Last visit with me was 02/2020.  At that time was having unusual skin reaction/infection on right hand and arm.  HTN: atenolol; follows with Dr Lyla Glassing cardio eval recently and then also had lower extrem eval with doppler recently.   Hx of bilat LE DVT: saw vasc surg 06/08/19: lasix helps with swelling, compression stockings - wears these when he is driving. Taking lasix 40mg  but still swelling even first thing in morning with getting out of bed. Has not been wearing compression stockings due to difficulty with getting them on. Doesn't feel like he is getting out a lot of fluid with the lasix when he takes it. Used to really affect him. Has even tried taking 2 of tablets. He is good about drinking water. Can't sit with legs elevated due to back. He does have wedge pillow and uses this sometimes.   HL: lipitor 20mg   Graves disease: methimazole 5mg  daily; follows with Dr. Cruzita Lederer.   Peripheral neuropathy: lyrica 200mg  BID. Pain from back. This helps, but is not taking away discomfort. Didn't tolerate gabapentin in past.   Hx of adenomatous colon polyps; due for repeat colonoscopy 12/2020 (Dr. Carlean Purl).  DMII: A1C 2/28 was 6.6, then 6.3 on 03/07/20, then up to 6.9 on 06/06/20.  Not on medication.  GERD: noted on endoscopy; not having reflux symptoms.  Takes protonix daily.   Taking doxycycline, metrogel for rosacea.   Sees Dr. Truman Hayward for eye check annually.   No Known Allergies Current Meds  Medication Sig   atenolol (TENORMIN) 100 MG tablet TAKE 1 TABLET (100 MG TOTAL) BY MOUTH DAILY.   atorvastatin (LIPITOR) 20 MG tablet TAKE 1 TABLET BY MOUTH ONCE DAILY **NEEDS OFFICE VISIT FOR FURTHER REFILLS**   cyclobenzaprine (FLEXERIL) 10 MG tablet Take 1 tablet (10 mg total) by mouth 3 (three) times daily as needed for muscle spasms.   doxycycline (VIBRA-TABS) 100 MG tablet TAKE 1 TABLET BY MOUTH ONCE DAILY (NEED LAB APPT) (Patient taking differently: Take 100 mg by mouth daily. )   furosemide (LASIX) 40 MG tablet TAKE 1 TABLET BY MOUTH ONCE DAILY   methimazole (TAPAZOLE) 5 MG tablet Take 1 tablet (5 mg total) by mouth daily. TAKE 1 TABLET (5 MG) BY MOUTH DAILY IN THE MORNING   methocarbamol (ROBAXIN) 500 MG tablet Take 500 mg by mouth every 8 (eight) hours as needed for muscle spasms.    metroNIDAZOLE (METROGEL) 1 % gel Apply topically daily. (Patient taking differently: Apply 1 application topically daily as needed (rash). )   NEEDLE, DISP, 22 G (BD ECLIPSE NEEDLE) 22G X 1-1/2" MISC Use as directed   oxyCODONE-acetaminophen (PERCOCET/ROXICET) 5-325 MG tablet Take 1-2 tablets by mouth every 4 (four) hours as needed for moderate pain or severe pain.   pantoprazole (PROTONIX) 40 MG tablet TAKE 1 TABLET  BY MOUTH DAILY * PT NEEDS APPT   pregabalin (LYRICA) 200 MG capsule TAKE 1 CAPSULE BY MOUTH TWICE DAILY. DO NOT TAKE GABAPENTIN WHILE TAKING THIS MEDICATION (Patient taking differently: Take 200 mg by mouth 2 (two) times daily. TAKE 1 CAPSULE BY MOUTH TWICE DAILY. DO NOT TAKE GABAPENTIN WHILE TAKING THIS MEDICATION)   sildenafil (REVATIO) 20 MG tablet TAKE 2 TO 5 TABLETS BY MOUTH PRIOR TO INTERCOURSE (Patient taking differently: Take 20-100 mg by mouth See admin instructions. TAKE 2 TO 5 TABLETS BY MOUTH PRIOR TO INTERCOURSE)   Syringe,  Disposable, 3 ML MISC Use as directed   testosterone cypionate (DEPOTESTOSTERONE CYPIONATE) 200 MG/ML injection INJECT 1 ML INTO THE MUSCLE EVERY 14 DAYS (Patient taking differently: Inject 200 mg into the muscle every 14 (fourteen) days. INJECT 1 ML INTO THE MUSCLE EVERY 14 DAYS)   triamcinolone cream (KENALOG) 0.1 % Apply 1 application topically 2 (two) times daily. (Patient taking differently: Apply 1 application topically 2 (two) times daily as needed (irritation). )    Review of Systems  Constitutional: Negative for chills, fatigue and fever.  Respiratory: Negative for cough, chest tightness, shortness of breath and wheezing.   Cardiovascular: Positive for leg swelling. Negative for chest pain and palpitations.  Musculoskeletal: Positive for back pain. Negative for neck pain and neck stiffness.    Objective:  BP 118/90 (BP Location: Left Arm, Patient Position: Sitting, Cuff Size: Large)    Pulse 60    Temp 97.9 F (36.6 C) (Oral)    Ht 6\' 2"  (1.88 m)    Wt 282 lb 4.8 oz (128.1 kg)    SpO2 93%    BMI 36.25 kg/m   Weight: 282 lb 4.8 oz (128.1 kg)   BP Readings from Last 3 Encounters:  08/08/20 118/90  06/13/20 116/67  06/06/20 123/77   Wt Readings from Last 3 Encounters:  08/08/20 282 lb 4.8 oz (128.1 kg)  06/11/20 279 lb 5 oz (126.7 kg)  06/06/20 (!) 279 lb 5 oz (126.7 kg)    Physical Exam Constitutional:      General: He is not in acute distress.    Appearance: He is well-developed.  Cardiovascular:     Rate and Rhythm: Normal rate and regular rhythm.     Heart sounds: Normal heart sounds. No murmur heard.  No friction rub.  Pulmonary:     Effort: Pulmonary effort is normal. No respiratory distress.     Breath sounds: Normal breath sounds. No wheezing or rales. Decreased breath sounds: slightly.  Musculoskeletal:     Right lower leg: 2+ Pitting Edema present.     Left lower leg: 2+ Pitting Edema present.  Neurological:     Mental Status: He is alert and oriented to  person, place, and time.  Psychiatric:        Behavior: Behavior normal.     Assessment/Plan  1. Essential hypertension Pressure slightly elevated today.  Continue current medications.  We are going to increase Lasix dose to hopefully better help with lower extremity edema, so I am not going to make adjustment in other blood pressure medications today.  Follow-up pending lab work and response to increase Lasix dose. - CBC with Differential/Platelet; Future - Comprehensive metabolic panel; Future  2. Controlled type 2 diabetes mellitus with diabetic polyneuropathy, without long-term current use of insulin ([place]) He has been well controlled with blood sugars generally.  We will continue to monitor A1c and I did encourage him to exercise to help with sugar control. -  Microalbumin / creatinine urine ratio; Future - Hemoglobin A1c; Future  3. Graves disease The with endocrinology.  Currently on methimazole 5 mg daily.  4. Low testosterone Getting testosterone replacements. - PSA; Future - Testosterone; Future  5. Edema, unspecified type We are going to have him increase his Lasix to 80 mg and see if this helps with edema.  He currently has a very difficult time putting on his compression stockings secondary to his postoperative lumbar fusion state.  He will try to do this if able as we discussed benefits of compression stockings.  We discussed importance of elevating legs above heart level and he does have tools to do this at home including a wedge pillow.  If he does not get improvement with 80 mg of Lasix, we will use torsemide.  Heart and lung exam is very good, so I suspect this is more an issue of dependent edema and decreased activity status post surgery. - Brain natriuretic peptide; Future  6. Abnormal pulse oximetry Pulse ox on the lower end today recheck is still showing lower end.  Chest exam normal.  Patient is not short of breath.  Will get chest x-ray to make sure nothing else  going on.  Follow-up pending result. - DG Chest 2 View; Future  7. Hyperlipidemia, unspecified hyperlipidemia type Continue Lipitor and fenofibrate. - Lipid panel; Future    Return for Chronic condition visit.    Micheline Rough, MD

## 2020-08-08 NOTE — Patient Instructions (Signed)
Try taking 2 lasix (2 of the 40mg  which will be 80mg ) together in the morning and see if you get fluid out with this. If you don't notice that you get fluid out let me know.

## 2020-08-09 ENCOUNTER — Other Ambulatory Visit: Payer: Self-pay | Admitting: Family Medicine

## 2020-08-09 LAB — CBC WITH DIFFERENTIAL/PLATELET
Absolute Monocytes: 679 cells/uL (ref 200–950)
Basophils Absolute: 31 cells/uL (ref 0–200)
Basophils Relative: 0.4 %
Eosinophils Absolute: 148 cells/uL (ref 15–500)
Eosinophils Relative: 1.9 %
HCT: 44.8 % (ref 38.5–50.0)
Hemoglobin: 14.8 g/dL (ref 13.2–17.1)
Lymphs Abs: 2028 cells/uL (ref 850–3900)
MCH: 29.4 pg (ref 27.0–33.0)
MCHC: 33 g/dL (ref 32.0–36.0)
MCV: 89.1 fL (ref 80.0–100.0)
MPV: 10.1 fL (ref 7.5–12.5)
Monocytes Relative: 8.7 %
Neutro Abs: 4914 cells/uL (ref 1500–7800)
Neutrophils Relative %: 63 %
Platelets: 260 10*3/uL (ref 140–400)
RBC: 5.03 10*6/uL (ref 4.20–5.80)
RDW: 14.3 % (ref 11.0–15.0)
Total Lymphocyte: 26 %
WBC: 7.8 10*3/uL (ref 3.8–10.8)

## 2020-08-09 LAB — LIPID PANEL
Cholesterol: 127 mg/dL (ref <200)
HDL: 31 mg/dL — ABNORMAL LOW (ref >=40)
Non-HDL Cholesterol (Calc): 96 mg/dL (calc) (ref <130)
Total CHOL/HDL Ratio: 4.1 (calc) (ref <5.0)
Triglycerides: 402 mg/dL — ABNORMAL HIGH (ref <150)

## 2020-08-09 LAB — COMPREHENSIVE METABOLIC PANEL
AG Ratio: 1.4 (calc) (ref 1.0–2.5)
ALT: 24 U/L (ref 9–46)
AST: 24 U/L (ref 10–35)
Albumin: 4.2 g/dL (ref 3.6–5.1)
Alkaline phosphatase (APISO): 119 U/L (ref 35–144)
BUN: 13 mg/dL (ref 7–25)
CO2: 27 mmol/L (ref 20–32)
Calcium: 9.2 mg/dL (ref 8.6–10.3)
Chloride: 99 mmol/L (ref 98–110)
Creat: 0.82 mg/dL (ref 0.70–1.25)
Globulin: 3 g/dL (calc) (ref 1.9–3.7)
Glucose, Bld: 113 mg/dL — ABNORMAL HIGH (ref 65–99)
Potassium: 4.5 mmol/L (ref 3.5–5.3)
Sodium: 135 mmol/L (ref 135–146)
Total Bilirubin: 0.5 mg/dL (ref 0.2–1.2)
Total Protein: 7.2 g/dL (ref 6.1–8.1)

## 2020-08-09 LAB — HEMOGLOBIN A1C
Hgb A1c MFr Bld: 5.8 % of total Hgb — ABNORMAL HIGH (ref <5.7)
Mean Plasma Glucose: 120 (calc)
eAG (mmol/L): 6.6 (calc)

## 2020-08-09 LAB — MICROALBUMIN / CREATININE URINE RATIO
Creatinine, Urine: 34 mg/dL (ref 20–320)
Microalb Creat Ratio: 15 mcg/mg creat (ref <30)
Microalb, Ur: 0.5 mg/dL

## 2020-08-09 LAB — TESTOSTERONE: Testosterone: 120 ng/dL — ABNORMAL LOW (ref 250–827)

## 2020-08-09 LAB — PSA: PSA: 0.34 ng/mL (ref <=4.0)

## 2020-08-09 LAB — BRAIN NATRIURETIC PEPTIDE: Brain Natriuretic Peptide: 36 pg/mL (ref <100)

## 2020-08-09 MED ORDER — FENOFIBRATE 48 MG PO TABS
48.0000 mg | ORAL_TABLET | Freq: Every day | ORAL | 1 refills | Status: DC
Start: 2020-08-09 — End: 2020-08-11

## 2020-08-09 MED FILL — DOXYCYCLINE HYCLATE 100 MG: 100 | 90 days supply | Qty: 90 | Fill #0

## 2020-08-11 MED ORDER — FENOFIBRATE 48 MG PO TABS: 48.0000 mg | ORAL_TABLET | Freq: Every day | ORAL | 1 refills | Status: DC

## 2020-08-12 ENCOUNTER — Other Ambulatory Visit (HOSPITAL_COMMUNITY): Payer: Self-pay | Admitting: Orthopedic Surgery

## 2020-08-12 MED FILL — HYDROCODON-APAP 5-325: 5-325 | 12 days supply | Qty: 50 | Fill #0

## 2020-08-18 ENCOUNTER — Other Ambulatory Visit: Payer: Self-pay | Admitting: Family Medicine

## 2020-08-18 MED FILL — MELOXICAM 15 MG TABLET: 15 | 60 days supply | Qty: 60 | Fill #1

## 2020-08-18 MED FILL — TRIAMCINOLONE 0.1% CREAM: 0.1 | 30 days supply | Qty: 60 | Fill #1

## 2020-08-20 ENCOUNTER — Other Ambulatory Visit: Payer: Self-pay | Admitting: Family Medicine

## 2020-08-24 ENCOUNTER — Other Ambulatory Visit: Payer: Self-pay | Admitting: Family Medicine

## 2020-08-24 DIAGNOSIS — R7989 Other specified abnormal findings of blood chemistry: Secondary | ICD-10-CM

## 2020-08-24 DIAGNOSIS — R609 Edema, unspecified: Secondary | ICD-10-CM

## 2020-08-24 MED ORDER — TESTOSTERONE CYPIONATE 200 MG/ML IM SOLN: INTRAMUSCULAR | 5 refills | Status: DC

## 2020-08-24 MED ORDER — FUROSEMIDE 40 MG PO TABS: 40.0000 mg | ORAL_TABLET | Freq: Every day | ORAL | 1 refills | Status: DC

## 2020-08-25 MED FILL — TESTOSTERONE CYP 200 MG/ML: 200 | 84 days supply | Qty: 6 | Fill #0

## 2020-08-29 MED FILL — CYCLOBENZAPRINE HCL 10 MG T: 10 | 30 days supply | Qty: 90 | Fill #2

## 2020-09-05 ENCOUNTER — Other Ambulatory Visit (HOSPITAL_COMMUNITY): Payer: Self-pay | Admitting: Orthopedic Surgery

## 2020-09-05 MED FILL — traMADol HCL 50 MG TABS: 50 | 6 days supply | Qty: 50 | Fill #0

## 2020-09-06 ENCOUNTER — Ambulatory Visit: Payer: No Typology Code available for payment source | Attending: Internal Medicine

## 2020-09-06 DIAGNOSIS — Z23 Encounter for immunization: Secondary | ICD-10-CM

## 2020-09-06 NOTE — Progress Notes (Signed)
   Covid-19 Vaccination [facility]  Name:  Jeffrey Price    MRN: [id number] DOB: [birth year]  09/06/2020  [name] was observed post Covid-19 immunization for 15 minutes without incident. He was provided with Vaccine Information Sheet and instruction to access the V-Safe system.   [name] was instructed to call 911 with any severe reactions post vaccine: Marland Kitchen Difficulty breathing  . Swelling of face and throat  . A fast heartbeat  . A bad rash all over body  . Dizziness and weakness

## 2020-09-23 ENCOUNTER — Other Ambulatory Visit: Payer: Self-pay | Admitting: Family Medicine

## 2020-09-23 DIAGNOSIS — E0500 Thyrotoxicosis with diffuse goiter without thyrotoxic crisis or storm: Secondary | ICD-10-CM

## 2020-09-23 MED FILL — PANTOPRAZOLE SOD DR 40 MG T: 40 | 90 days supply | Qty: 90 | Fill #0

## 2020-09-23 MED FILL — FUROSEMIDE 40 MG TAB: 40 | 90 days supply | Qty: 180 | Fill #0

## 2020-09-29 ENCOUNTER — Other Ambulatory Visit: Payer: Self-pay | Admitting: Family Medicine

## 2020-09-29 DIAGNOSIS — G629 Polyneuropathy, unspecified: Secondary | ICD-10-CM

## 2020-09-30 ENCOUNTER — Other Ambulatory Visit: Payer: Self-pay | Admitting: Family Medicine

## 2020-09-30 MED FILL — PREGABALIN 200 MG CAPS: 200 | 90 days supply | Qty: 180 | Fill #0

## 2020-10-03 ENCOUNTER — Other Ambulatory Visit: Payer: Self-pay | Admitting: Family Medicine

## 2020-10-03 DIAGNOSIS — E0500 Thyrotoxicosis with diffuse goiter without thyrotoxic crisis or storm: Secondary | ICD-10-CM

## 2020-10-06 ENCOUNTER — Other Ambulatory Visit: Payer: Self-pay | Admitting: Family Medicine

## 2020-10-06 MED FILL — methIMAzole 5 MG TABS: 5 | 90 days supply | Qty: 90 | Fill #0

## 2020-10-20 MED FILL — ATENOLOL 100 MG TABLET: 100 | 90 days supply | Qty: 90 | Fill #1

## 2020-11-11 ENCOUNTER — Other Ambulatory Visit: Payer: Self-pay | Admitting: Family Medicine

## 2020-11-11 DIAGNOSIS — E785 Hyperlipidemia, unspecified: Secondary | ICD-10-CM

## 2020-11-11 MED FILL — ATORVASTATIN CALCIUM 20 MG: 20 | 90 days supply | Qty: 90 | Fill #0

## 2020-11-11 MED FILL — CYCLOBENZAPRINE HCL 10 MG T: 10 | 30 days supply | Qty: 90 | Fill #3

## 2020-11-11 MED FILL — MELOXICAM 15 MG TABLET: 15 | 60 days supply | Qty: 60 | Fill #2

## 2020-11-11 MED FILL — DOXYCYCLINE HYCLATE 100 MG: 100 | 90 days supply | Qty: 90 | Fill #1

## 2020-11-21 ENCOUNTER — Other Ambulatory Visit (INDEPENDENT_AMBULATORY_CARE_PROVIDER_SITE_OTHER): Payer: No Typology Code available for payment source

## 2020-11-21 ENCOUNTER — Other Ambulatory Visit: Payer: No Typology Code available for payment source

## 2020-11-21 ENCOUNTER — Other Ambulatory Visit: Payer: Self-pay

## 2020-11-21 DIAGNOSIS — E0500 Thyrotoxicosis with diffuse goiter without thyrotoxic crisis or storm: Secondary | ICD-10-CM

## 2020-11-21 DIAGNOSIS — R7989 Other specified abnormal findings of blood chemistry: Secondary | ICD-10-CM

## 2020-11-21 DIAGNOSIS — R609 Edema, unspecified: Secondary | ICD-10-CM

## 2020-11-21 LAB — TESTOSTERONE: Testosterone: 543.24 ng/dL (ref 300.00–890.00)

## 2020-11-21 LAB — BASIC METABOLIC PANEL
BUN: 13 mg/dL (ref 6–23)
CO2: 32 mEq/L (ref 19–32)
Calcium: 9 mg/dL (ref 8.4–10.5)
Chloride: 97 mEq/L (ref 96–112)
Creatinine, Ser: 0.97 mg/dL (ref 0.40–1.50)
GFR: 82.79 mL/min (ref >60.00)
Glucose, Bld: 124 mg/dL — ABNORMAL HIGH (ref 70–99)
Potassium: 4.7 mEq/L (ref 3.5–5.1)
Sodium: 134 mEq/L — ABNORMAL LOW (ref 135–145)

## 2020-11-21 LAB — TSH: TSH: 3.29 u[IU]/mL (ref 0.35–4.50)

## 2020-11-21 NOTE — Addendum Note (Signed)
Addended by: Jenness Corner L on: 11/21/2020 11:06 AM   Modules accepted: Orders

## 2020-11-26 ENCOUNTER — Other Ambulatory Visit: Payer: Self-pay | Admitting: Family Medicine

## 2020-11-26 DIAGNOSIS — E0500 Thyrotoxicosis with diffuse goiter without thyrotoxic crisis or storm: Secondary | ICD-10-CM

## 2020-11-26 MED ORDER — METHIMAZOLE 5 MG PO TABS: ORAL_TABLET | ORAL | 1 refills | Status: DC

## 2020-12-17 MED FILL — FUROSEMIDE 40 MG TAB: 40 | 90 days supply | Qty: 180 | Fill #1

## 2020-12-31 ENCOUNTER — Other Ambulatory Visit: Payer: Self-pay | Admitting: Family Medicine

## 2020-12-31 ENCOUNTER — Telehealth: Payer: Self-pay | Admitting: Family Medicine

## 2020-12-31 DIAGNOSIS — K219 Gastro-esophageal reflux disease without esophagitis: Secondary | ICD-10-CM

## 2020-12-31 MED ORDER — PANTOPRAZOLE SODIUM 40 MG PO TBEC: DELAYED_RELEASE_TABLET | ORAL | 0 refills | Status: DC

## 2020-12-31 MED FILL — PREGABALIN 200 MG CAPS: 200 | 90 days supply | Qty: 180 | Fill #1

## 2020-12-31 MED FILL — SILDENAFIL CITRATE 20 MG TA: 20 | 60 days supply | Qty: 30 | Fill #1

## 2020-12-31 MED FILL — methIMAzole 5 MG TABS: 5 | 90 days supply | Qty: 90 | Fill #0

## 2020-12-31 MED FILL — TRIAMCINOLONE 0.1% CREAM: 0.1 | 30 days supply | Qty: 60 | Fill #2

## 2020-12-31 MED FILL — PANTOPRAZOLE SOD DR 40 MG T: 40 | 90 days supply | Qty: 90 | Fill #0

## 2020-12-31 NOTE — Telephone Encounter (Signed)
Rx done. 

## 2020-12-31 NOTE — Telephone Encounter (Signed)
Patient is calling and is requesting a refill for pantoprazole (PROTONIX) 40 MG tablet sent to [facility], [place], [place] [address]  Phone:  [phone] Fax:  [phone]  CB is [phone]

## 2021-01-10 IMAGING — RF DG LUMBAR SPINE 2-3V
1 series · 2 of 2 positions shown · non-contrast
Comparison: Previous intraoperative film

CLINICAL DATA: Lumbar discectomy

EXAM:
LUMBAR SPINE - 2-3 VIEW; DG C-ARM 1-60 MIN

[Series 1: run · 2 of 2 slices shown]
[im 1/2]
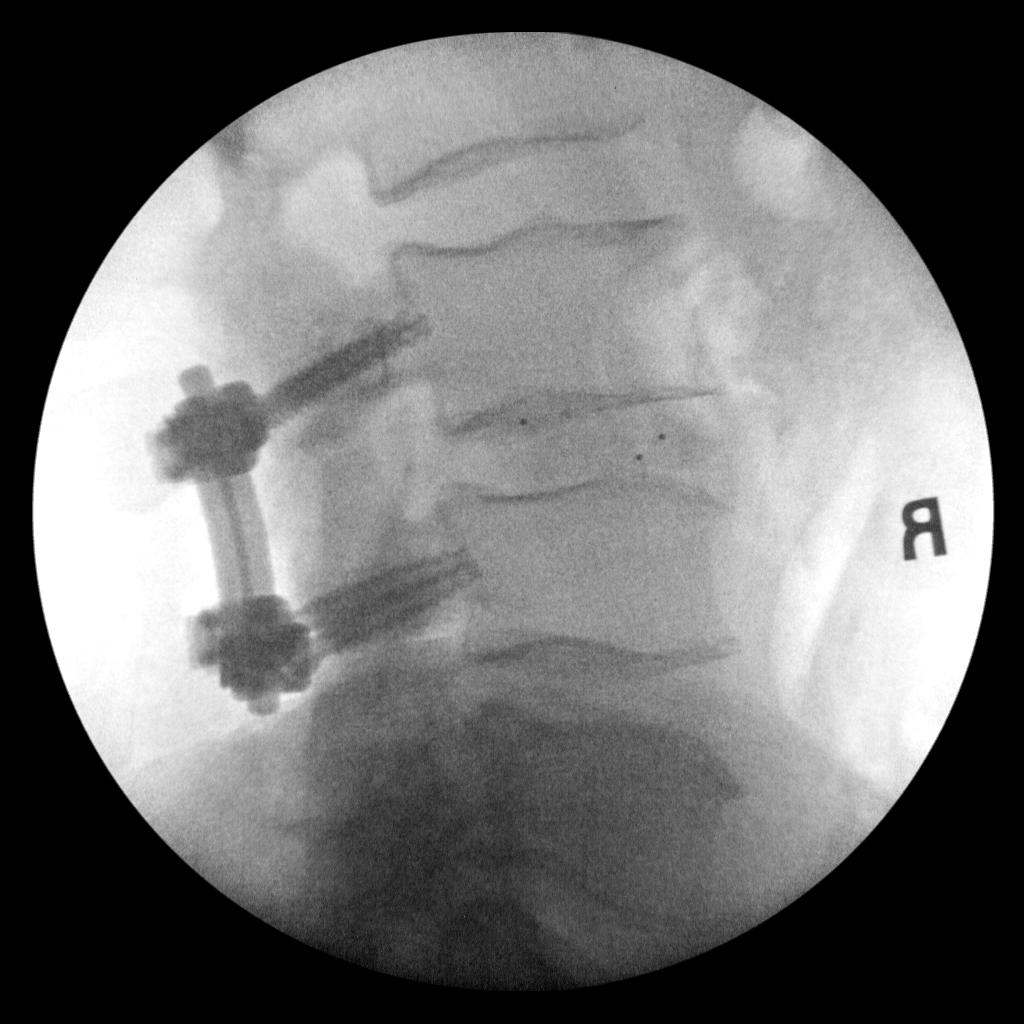
[im 2/2]
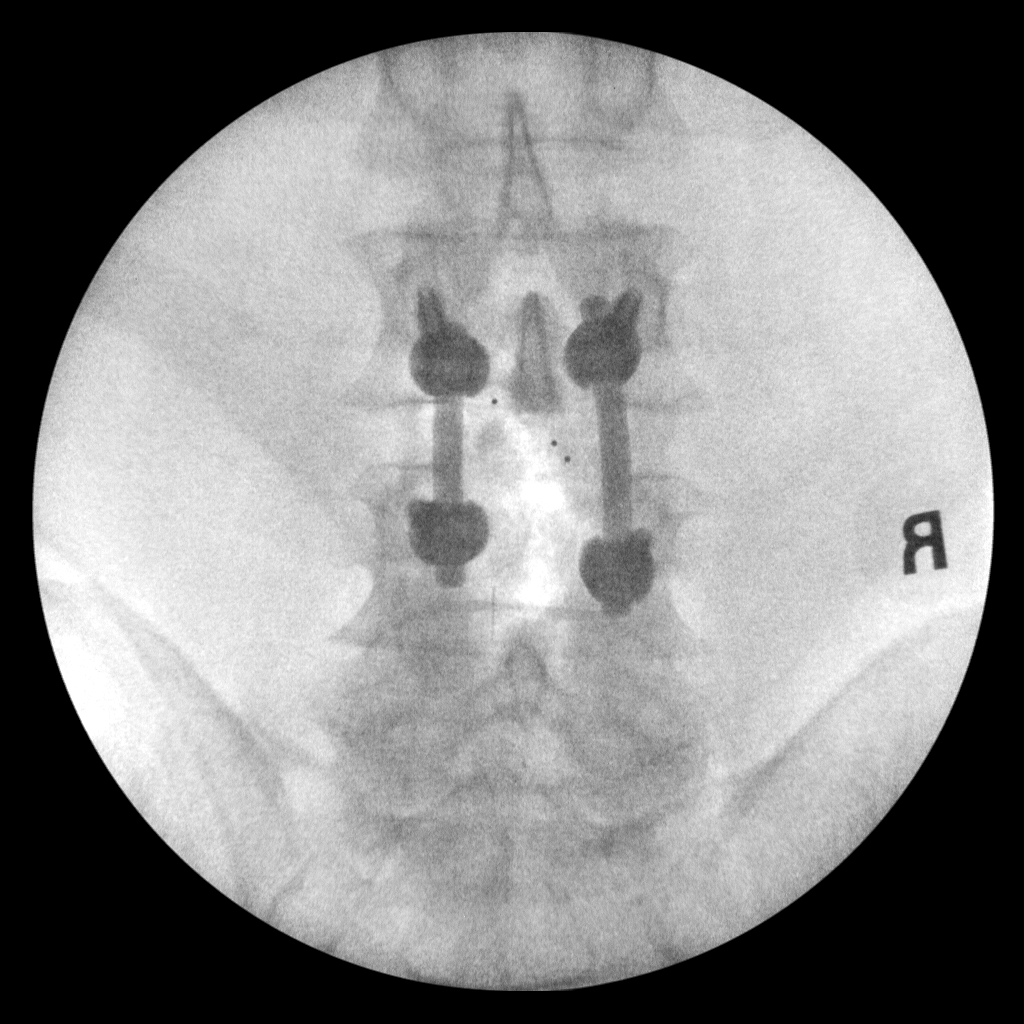

[2 of 2 positions shown; findings below may reference images not displayed]

FLUOROSCOPY TIME:  Radiation Exposure Index (as provided by the
fluoroscopic device): 45 mGy

If the device does not provide the exposure index:

Fluoroscopy Time:  44 seconds

Number of Acquired Images:  2
FINDINGS: Interbody fusion at L3-4 is noted. Pedicle screws and posterior
fixation is seen.
IMPRESSION: L3-4 lumbar fusion.

## 2021-01-23 ENCOUNTER — Other Ambulatory Visit: Payer: Self-pay | Admitting: Family Medicine

## 2021-01-23 DIAGNOSIS — I10 Essential (primary) hypertension: Secondary | ICD-10-CM

## 2021-01-27 ENCOUNTER — Other Ambulatory Visit (HOSPITAL_BASED_OUTPATIENT_CLINIC_OR_DEPARTMENT_OTHER): Payer: Self-pay

## 2021-02-13 ENCOUNTER — Other Ambulatory Visit: Payer: Self-pay | Admitting: Family Medicine

## 2021-02-13 ENCOUNTER — Other Ambulatory Visit (HOSPITAL_COMMUNITY): Payer: Self-pay

## 2021-02-13 DIAGNOSIS — E785 Hyperlipidemia, unspecified: Secondary | ICD-10-CM

## 2021-02-13 MED ORDER — DOXYCYCLINE HYCLATE 100 MG PO TABS
ORAL_TABLET | Freq: Every day | ORAL | 0 refills | Status: DC
Start: 2021-02-13 — End: 2021-05-12
  Filled 2021-02-13: qty 90, 90d supply, fill #0

## 2021-02-13 MED ORDER — ATORVASTATIN CALCIUM 20 MG PO TABS
ORAL_TABLET | Freq: Every day | ORAL | 0 refills | Status: DC
  Filled 2021-02-13: qty 90, 90d supply, fill #0

## 2021-02-13 MED FILL — Testosterone Cypionate IM Inj in Oil 200 MG/ML: INTRAMUSCULAR | 84 days supply | Qty: 6 | Fill #0 | Status: AC

## 2021-02-13 MED FILL — Sildenafil Citrate Tab 20 MG: ORAL | 5 days supply | Qty: 30 | Fill #0 | Status: CN

## 2021-02-16 ENCOUNTER — Other Ambulatory Visit (HOSPITAL_COMMUNITY): Payer: Self-pay

## 2021-02-16 ENCOUNTER — Other Ambulatory Visit: Payer: Self-pay | Admitting: Family Medicine

## 2021-02-16 MED ORDER — CYCLOBENZAPRINE HCL 10 MG PO TABS
ORAL_TABLET | Freq: Three times a day (TID) | ORAL | 3 refills | Status: DC | PRN
  Filled 2021-02-16: qty 90, 30d supply, fill #0
  Filled 2021-04-22: qty 90, 30d supply, fill #1

## 2021-02-17 ENCOUNTER — Other Ambulatory Visit (HOSPITAL_COMMUNITY): Payer: Self-pay

## 2021-02-18 ENCOUNTER — Other Ambulatory Visit (HOSPITAL_COMMUNITY): Payer: Self-pay

## 2021-03-27 ENCOUNTER — Encounter: Payer: Self-pay | Admitting: Family Medicine

## 2021-03-30 ENCOUNTER — Other Ambulatory Visit: Payer: Self-pay | Admitting: Family Medicine

## 2021-03-30 ENCOUNTER — Other Ambulatory Visit (HOSPITAL_COMMUNITY): Payer: Self-pay

## 2021-03-30 DIAGNOSIS — G629 Polyneuropathy, unspecified: Secondary | ICD-10-CM

## 2021-03-30 DIAGNOSIS — K219 Gastro-esophageal reflux disease without esophagitis: Secondary | ICD-10-CM

## 2021-03-30 MED ORDER — PREGABALIN 200 MG PO CAPS
ORAL_CAPSULE | ORAL | 1 refills | Status: DC
  Filled 2021-03-30: qty 180, 90d supply, fill #0
  Filled 2021-06-23: qty 180, 90d supply, fill #1

## 2021-03-30 MED ORDER — PANTOPRAZOLE SODIUM 40 MG PO TBEC
DELAYED_RELEASE_TABLET | ORAL | 0 refills | Status: DC
  Filled 2021-03-30: qty 90, 90d supply, fill #0

## 2021-03-30 MED ORDER — CLOTRIMAZOLE 1 % EX CREA
1.0000 "application " | TOPICAL_CREAM | Freq: Two times a day (BID) | CUTANEOUS | 2 refills | Status: DC
  Filled 2021-03-30 (×2): qty 30, 15d supply, fill #0

## 2021-03-30 MED ORDER — FUROSEMIDE 40 MG PO TABS
ORAL_TABLET | Freq: Every day | ORAL | 0 refills | Status: DC
  Filled 2021-03-30: qty 180, 90d supply, fill #0

## 2021-03-30 MED FILL — Methimazole Tab 5 MG: ORAL | 90 days supply | Qty: 90 | Fill #0 | Status: AC

## 2021-03-31 ENCOUNTER — Other Ambulatory Visit (HOSPITAL_COMMUNITY): Payer: Self-pay

## 2021-04-01 ENCOUNTER — Other Ambulatory Visit (HOSPITAL_COMMUNITY): Payer: Self-pay

## 2021-04-08 ENCOUNTER — Other Ambulatory Visit (HOSPITAL_COMMUNITY): Payer: Self-pay

## 2021-04-08 ENCOUNTER — Emergency Department (HOSPITAL_COMMUNITY): Payer: Worker's Compensation

## 2021-04-08 ENCOUNTER — Emergency Department (HOSPITAL_COMMUNITY)
Admission: EM | Admit: 2021-04-08 | Discharge: 2021-04-08 | Disposition: A | Discharge status: Home / Self Care | Payer: Worker's Compensation | Attending: Emergency Medicine | Admitting: Emergency Medicine

## 2021-04-08 ENCOUNTER — Encounter (HOSPITAL_COMMUNITY): Payer: Self-pay | Admitting: Emergency Medicine

## 2021-04-08 DIAGNOSIS — I10 Essential (primary) hypertension: Secondary | ICD-10-CM | POA: Insufficient documentation

## 2021-04-08 DIAGNOSIS — Y92410 Unspecified street and highway as the place of occurrence of the external cause: Secondary | ICD-10-CM | POA: Diagnosis not present

## 2021-04-08 DIAGNOSIS — Z87891 Personal history of nicotine dependence: Secondary | ICD-10-CM | POA: Diagnosis not present

## 2021-04-08 DIAGNOSIS — S199XXA Unspecified injury of neck, initial encounter: Secondary | ICD-10-CM | POA: Insufficient documentation

## 2021-04-08 DIAGNOSIS — K219 Gastro-esophageal reflux disease without esophagitis: Secondary | ICD-10-CM | POA: Insufficient documentation

## 2021-04-08 DIAGNOSIS — S301XXA Contusion of abdominal wall, initial encounter: Secondary | ICD-10-CM | POA: Diagnosis not present

## 2021-04-08 DIAGNOSIS — S0990XA Unspecified injury of head, initial encounter: Secondary | ICD-10-CM | POA: Diagnosis not present

## 2021-04-08 DIAGNOSIS — E1149 Type 2 diabetes mellitus with other diabetic neurological complication: Secondary | ICD-10-CM | POA: Diagnosis not present

## 2021-04-08 DIAGNOSIS — Z79899 Other long term (current) drug therapy: Secondary | ICD-10-CM | POA: Diagnosis not present

## 2021-04-08 DIAGNOSIS — S299XXA Unspecified injury of thorax, initial encounter: Secondary | ICD-10-CM | POA: Diagnosis present

## 2021-04-08 DIAGNOSIS — S20212A Contusion of left front wall of thorax, initial encounter: Secondary | ICD-10-CM | POA: Insufficient documentation

## 2021-04-08 LAB — CBC WITH DIFFERENTIAL/PLATELET
Abs Immature Granulocytes: 0.03 10*3/uL (ref 0.00–0.07)
Basophils Absolute: 0 10*3/uL (ref 0.0–0.1)
Basophils Relative: 0 %
Eosinophils Absolute: 0.2 10*3/uL (ref 0.0–0.5)
Eosinophils Relative: 2 %
HCT: 52.6 % — ABNORMAL HIGH (ref 39.0–52.0)
Hemoglobin: 17.1 g/dL — ABNORMAL HIGH (ref 13.0–17.0)
Immature Granulocytes: 0 %
Lymphocytes Relative: 28 %
Lymphs Abs: 2.5 10*3/uL (ref 0.7–4.0)
MCH: 28.1 pg (ref 26.0–34.0)
MCHC: 32.5 g/dL (ref 30.0–36.0)
MCV: 86.5 fL (ref 80.0–100.0)
Monocytes Absolute: 0.7 10*3/uL (ref 0.1–1.0)
Monocytes Relative: 8 %
Neutro Abs: 5.4 10*3/uL (ref 1.7–7.7)
Neutrophils Relative %: 62 %
Platelets: 234 10*3/uL (ref 150–400)
RBC: 6.08 MIL/uL — ABNORMAL HIGH (ref 4.22–5.81)
RDW: 15.9 % — ABNORMAL HIGH (ref 11.5–15.5)
WBC: 8.8 10*3/uL (ref 4.0–10.5)
nRBC: 0 % (ref 0.0–0.2)

## 2021-04-08 LAB — BASIC METABOLIC PANEL
Anion gap: 10 (ref 5–15)
BUN: 8 mg/dL (ref 8–23)
CO2: 26 mmol/L (ref 22–32)
Calcium: 8.7 mg/dL — ABNORMAL LOW (ref 8.9–10.3)
Chloride: 97 mmol/L — ABNORMAL LOW (ref 98–111)
Creatinine, Ser: 0.98 mg/dL (ref 0.61–1.24)
GFR, Estimated: >60 mL/min (ref >60)
Glucose, Bld: 139 mg/dL — ABNORMAL HIGH (ref 70–99)
Potassium: 3.8 mmol/L (ref 3.5–5.1)
Sodium: 133 mmol/L — ABNORMAL LOW (ref 135–145)

## 2021-04-08 MED ORDER — HYDROCODONE-ACETAMINOPHEN 5-325 MG PO TABS
1.0000 | ORAL_TABLET | Freq: Once | ORAL | Status: AC
  Administered 2021-04-08: 1 via ORAL
  Filled 2021-04-08: qty 1

## 2021-04-08 MED ORDER — MORPHINE SULFATE (PF) 4 MG/ML IV SOLN
4.0000 mg | Freq: Once | INTRAVENOUS | Status: AC
  Administered 2021-04-08: 4 mg via INTRAVENOUS
  Filled 2021-04-08: qty 1

## 2021-04-08 MED ORDER — HYDROCODONE-ACETAMINOPHEN 5-325 MG PO TABS: 1.0000 | ORAL_TABLET | Freq: Four times a day (QID) | ORAL | 0 refills | Status: DC | PRN

## 2021-04-08 MED ORDER — IOHEXOL 300 MG/ML  SOLN
100.0000 mL | Freq: Once | INTRAMUSCULAR | Status: AC | PRN
  Administered 2021-04-08: 100 mL via INTRAVENOUS

## 2021-04-08 MED ORDER — CYCLOBENZAPRINE HCL 10 MG PO TABS: 10.0000 mg | ORAL_TABLET | Freq: Two times a day (BID) | ORAL | 0 refills | Status: DC | PRN
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
## 2021-04-08 NOTE — Discharge Instructions (Signed)
Return for any problem.  ?
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
## 2021-04-09 ENCOUNTER — Encounter: Payer: Self-pay | Admitting: Internal Medicine

## 2021-04-13 ENCOUNTER — Telehealth: Payer: Self-pay | Admitting: Family Medicine
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
## 2021-04-17 ENCOUNTER — Other Ambulatory Visit (HOSPITAL_COMMUNITY): Payer: Self-pay

## 2021-04-22 ENCOUNTER — Other Ambulatory Visit (HOSPITAL_COMMUNITY): Payer: Self-pay

## 2021-04-22 ENCOUNTER — Other Ambulatory Visit: Payer: Self-pay | Admitting: Family Medicine

## 2021-04-22 DIAGNOSIS — I10 Essential (primary) hypertension: Secondary | ICD-10-CM

## 2021-04-22 MED ORDER — ATENOLOL 100 MG PO TABS
ORAL_TABLET | ORAL | 0 refills | Status: DC
  Filled 2021-04-22: qty 90, 90d supply, fill #0

## 2021-04-22 MED ORDER — MELOXICAM 15 MG PO TABS
ORAL_TABLET | ORAL | 2 refills | Status: DC
  Filled 2021-04-22: qty 60, 60d supply, fill #0

## 2021-04-22 MED FILL — Sildenafil Citrate Tab 20 MG: ORAL | 30 days supply | Qty: 30 | Fill #0 | Status: AC

## 2021-04-23 ENCOUNTER — Other Ambulatory Visit (HOSPITAL_COMMUNITY): Payer: Self-pay

## 2021-04-28 ENCOUNTER — Other Ambulatory Visit: Payer: Self-pay | Admitting: Orthopedic Surgery

## 2021-04-28 DIAGNOSIS — M542 Cervicalgia: Secondary | ICD-10-CM

## 2021-04-28 DIAGNOSIS — M545 Low back pain, unspecified: Secondary | ICD-10-CM

## 2021-05-01 ENCOUNTER — Other Ambulatory Visit (HOSPITAL_COMMUNITY): Payer: Self-pay

## 2021-05-01 MED ORDER — TRAMADOL HCL 50 MG PO TABS
ORAL_TABLET | ORAL | 0 refills | Status: DC
  Filled 2021-05-01: qty 42, 7d supply, fill #0

## 2021-05-12 ENCOUNTER — Other Ambulatory Visit (HOSPITAL_COMMUNITY): Payer: Self-pay

## 2021-05-12 ENCOUNTER — Other Ambulatory Visit: Payer: Self-pay | Admitting: Family Medicine

## 2021-05-12 DIAGNOSIS — E785 Hyperlipidemia, unspecified: Secondary | ICD-10-CM

## 2021-05-12 MED ORDER — ATORVASTATIN CALCIUM 20 MG PO TABS
ORAL_TABLET | Freq: Every day | ORAL | 0 refills | Status: DC
  Filled 2021-05-12: qty 90, 90d supply, fill #0

## 2021-05-12 MED ORDER — DOXYCYCLINE HYCLATE 100 MG PO TABS
ORAL_TABLET | Freq: Every day | ORAL | 0 refills | Status: DC
  Filled 2021-05-12: qty 90, 90d supply, fill #0

## 2021-05-18 ENCOUNTER — Ambulatory Visit
Admission: RE | Admit: 2021-05-18 | Discharge: 2021-05-18 | Disposition: A | Discharge status: Home / Self Care | Payer: Worker's Compensation | Source: Ambulatory Visit | Attending: Orthopedic Surgery | Admitting: Orthopedic Surgery

## 2021-05-18 ENCOUNTER — Other Ambulatory Visit: Payer: Self-pay

## 2021-05-18 ENCOUNTER — Ambulatory Visit
Admission: RE | Admit: 2021-05-18 | Discharge: 2021-05-18 | Disposition: A | Discharge status: Home / Self Care | Payer: Self-pay | Source: Ambulatory Visit | Attending: Orthopedic Surgery | Admitting: Orthopedic Surgery

## 2021-05-18 DIAGNOSIS — M542 Cervicalgia: Secondary | ICD-10-CM

## 2021-05-18 DIAGNOSIS — M545 Low back pain, unspecified: Secondary | ICD-10-CM

## 2021-06-08 ENCOUNTER — Other Ambulatory Visit: Payer: Self-pay | Admitting: Orthopedic Surgery

## 2021-06-12 NOTE — Progress Notes (Signed)
Surgical Instructions    Your procedure is scheduled on Thursday August 11.  Report to [facility] Main Entrance "A" at 8:30 A.M., then check in with the Admitting office.  Call this number if you have problems the morning of surgery:  [phone]   If you have any questions prior to your surgery date call [phone]: Open Monday-Friday 8am-4pm    Remember:  Do not eat after midnight the night before your surgery  You may drink clear liquids until 8:30am the morning of your surgery.   Clear liquids allowed are: Water, Non-Citrus Juices (without pulp), Carbonated Beverages, Clear Tea, Black Coffee Only, and Gatorade Patient Instructions  The night before surgery:  No food after midnight. ONLY clear liquids after midnight   The day of surgery (if you have diabetes): Drink ONE (1) 12 oz G2 given to you in your pre admission testing appointment by 8:30am the morning of surgery. Drink in one sitting. Do not sip.  This drink was given to you during your hospital  pre-op appointment visit.  Nothing else to drink after completing the  12 oz bottle of G2.         If you have questions, please contact your surgeon's office.      Take these medicines the morning of surgery with A SIP OF WATER :              atenolol (TENORMIN)               atorvastatin (LIPITOR)               cyclobenzaprine (FLEXERIL) if needed              doxycycline (VIBRA-TABS)               HYDROcodone-acetaminophen (NORCO/VICODIN) if needed              methimazole (TAPAZOLE)              pantoprazole (PROTONIX)               pregabalin (LYRICA)              traMADol (ULTRAM)                As of today, STOP taking any Aspirin (unless otherwise instructed by your surgeon) Aleve, Naproxen, Ibuprofen, Motrin, Advil, Goody's, BC's, all herbal medications, fish oil, and all vitamins.          Do not wear jewelry or makeup Do not wear lotions, powders, perfumes/colognes, or deodorant. Do not shave 48 hours  prior to surgery.  Men may shave face and neck. Do not bring valuables to the hospital. DO Not wear nail polish, gel polish, artificial nails, or any other type of covering on  natural nails including finger and toenails. If patients have artificial nails, gel coating, etc. that need to be removed by a nail salon please have this removed prior to surgery or surgery may need to be canceled/delayed if the surgeon/ anesthesia feels like the patient is unable to be adequately monitored.              is not responsible for any belongings or valuables.  Do NOT Smoke (Tobacco/Vaping) or drink Alcohol 24 hours prior to your procedure If you use a CPAP at night, you may bring all equipment for your overnight stay.   Contacts, glasses, dentures or bridgework may not be worn into surgery, please bring cases for these belongings  For patients admitted to the hospital, discharge time will be determined by your treatment team.   Patients discharged the day of surgery will not be allowed to drive home, and someone needs to stay with them for 24 hours.  ONLY 1 SUPPORT PERSON MAY BE PRESENT WHILE YOU ARE IN SURGERY. IF YOU ARE TO BE ADMITTED ONCE YOU ARE IN YOUR ROOM YOU WILL BE ALLOWED TWO (2) VISITORS.  Minor children may have two parents present. Special consideration for safety and communication needs will be reviewed on a case by case basis.  Special instructions:    Oral Hygiene is also important to reduce your risk of infection.  Remember - BRUSH YOUR TEETH THE MORNING OF SURGERY WITH YOUR REGULAR TOOTHPASTE   [place]- Preparing For Surgery  Before surgery, you can play an important role. Because skin is not sterile, your skin needs to be as free of germs as possible. You can reduce the number of germs on your skin by washing with CHG (chlorahexidine gluconate) Soap before surgery.  CHG is an antiseptic cleaner which kills germs and bonds with the skin to continue killing germs even  after washing.     Please do not use if you have an allergy to CHG or antibacterial soaps. If your skin becomes reddened/irritated stop using the CHG.  Do not shave (including legs and underarms) for at least 48 hours prior to first CHG shower. It is OK to shave your face.  Please follow these instructions carefully.     Shower the NIGHT BEFORE SURGERY and the MORNING OF SURGERY with CHG Soap.   If you chose to wash your hair, wash your hair first as usual with your normal shampoo. After you shampoo, rinse your hair and body thoroughly to remove the shampoo.  Then [employer] and genitals (private parts) with your normal soap and rinse thoroughly to remove soap.  After that Use CHG Soap as you would any other liquid soap. You can apply CHG directly to the skin and wash gently with a scrungie or a clean washcloth.   Apply the CHG Soap to your body ONLY FROM THE NECK DOWN.  Do not use on open wounds or open sores. Avoid contact with your eyes, ears, mouth and genitals (private parts). Wash Face and genitals (private parts)  with your normal soap.   Wash thoroughly, paying special attention to the area where your surgery will be performed.  Thoroughly rinse your body with warm water from the neck down.  DO NOT shower/wash with your normal soap after using and rinsing off the CHG Soap.  Pat yourself dry with a CLEAN TOWEL.  Wear CLEAN PAJAMAS to bed the night before surgery  Place CLEAN SHEETS on your bed the night before your surgery  DO NOT SLEEP WITH PETS.   Day of Surgery:  Take a shower with CHG soap. Wear Clean/Comfortable clothing the morning of surgery Do not apply any deodorants/lotions.   Remember to brush your teeth WITH YOUR REGULAR TOOTHPASTE.   Please read over the following fact sheets that you were given.

## 2021-06-15 ENCOUNTER — Encounter (HOSPITAL_COMMUNITY)
Admission: RE | Admit: 2021-06-15 | Discharge: 2021-06-15 | Disposition: A | Discharge status: Home / Self Care | Payer: Worker's Compensation | Source: Ambulatory Visit | Attending: Orthopedic Surgery | Admitting: Orthopedic Surgery

## 2021-06-15 ENCOUNTER — Other Ambulatory Visit: Payer: Self-pay

## 2021-06-15 ENCOUNTER — Encounter (HOSPITAL_COMMUNITY): Payer: Self-pay

## 2021-06-15 DIAGNOSIS — Z01818 Encounter for other preprocedural examination: Secondary | ICD-10-CM | POA: Insufficient documentation

## 2021-06-15 HISTORY — DX: Other complications of anesthesia, initial encounter: T88.59XA

## 2021-06-15 LAB — CBC WITH DIFFERENTIAL/PLATELET
Abs Immature Granulocytes: 0.02 10*3/uL (ref 0.00–0.07)
Basophils Absolute: 0 10*3/uL (ref 0.0–0.1)
Basophils Relative: 1 %
Eosinophils Absolute: 0.2 10*3/uL (ref 0.0–0.5)
Eosinophils Relative: 3 %
HCT: 51.9 % (ref 39.0–52.0)
Hemoglobin: 16.7 g/dL (ref 13.0–17.0)
Immature Granulocytes: 0 %
Lymphocytes Relative: 26 %
Lymphs Abs: 2.1 10*3/uL (ref 0.7–4.0)
MCH: 28.5 pg (ref 26.0–34.0)
MCHC: 32.2 g/dL (ref 30.0–36.0)
MCV: 88.7 fL (ref 80.0–100.0)
Monocytes Absolute: 0.8 10*3/uL (ref 0.1–1.0)
Monocytes Relative: 9 %
Neutro Abs: 4.9 10*3/uL (ref 1.7–7.7)
Neutrophils Relative %: 61 %
Platelets: 197 10*3/uL (ref 150–400)
RBC: 5.85 MIL/uL — ABNORMAL HIGH (ref 4.22–5.81)
RDW: 16.8 % — ABNORMAL HIGH (ref 11.5–15.5)
WBC: 8.1 10*3/uL (ref 4.0–10.5)
nRBC: 0 % (ref 0.0–0.2)

## 2021-06-15 LAB — COMPREHENSIVE METABOLIC PANEL
ALT: 21 U/L (ref 0–44)
AST: 31 U/L (ref 15–41)
Albumin: 3.4 g/dL — ABNORMAL LOW (ref 3.5–5.0)
Alkaline Phosphatase: 72 U/L (ref 38–126)
Anion gap: 9 (ref 5–15)
BUN: 15 mg/dL (ref 8–23)
CO2: 25 mmol/L (ref 22–32)
Calcium: 8.6 mg/dL — ABNORMAL LOW (ref 8.9–10.3)
Chloride: 101 mmol/L (ref 98–111)
Creatinine, Ser: 1.27 mg/dL — ABNORMAL HIGH (ref 0.61–1.24)
GFR, Estimated: >60 mL/min (ref >60)
Glucose, Bld: 131 mg/dL — ABNORMAL HIGH (ref 70–99)
Potassium: 4.6 mmol/L (ref 3.5–5.1)
Sodium: 135 mmol/L (ref 135–145)
Total Bilirubin: 0.8 mg/dL (ref 0.3–1.2)
Total Protein: 6.5 g/dL (ref 6.5–8.1)

## 2021-06-15 LAB — TYPE AND SCREEN
ABO/RH(D): A POS
Antibody Screen: NEGATIVE

## 2021-06-15 LAB — URINALYSIS, ROUTINE W REFLEX MICROSCOPIC
Bilirubin Urine: NEGATIVE
Glucose, UA: NEGATIVE mg/dL
Hgb urine dipstick: NEGATIVE
Ketones, ur: NEGATIVE mg/dL
Leukocytes,Ua: NEGATIVE
Nitrite: NEGATIVE
Protein, ur: NEGATIVE mg/dL
Specific Gravity, Urine: 1.009 (ref 1.005–1.030)
pH: 6 (ref 5.0–8.0)

## 2021-06-15 LAB — PROTIME-INR
INR: 1.1 (ref 0.8–1.2)
Prothrombin Time: 14.4 seconds (ref 11.4–15.2)

## 2021-06-15 LAB — SURGICAL PCR SCREEN
MRSA, PCR: NEGATIVE
Staphylococcus aureus: POSITIVE — AB

## 2021-06-15 LAB — APTT: aPTT: 31 seconds (ref 24–36)

## 2021-06-15 LAB — HEMOGLOBIN A1C
Hgb A1c MFr Bld: 6.7 % — ABNORMAL HIGH (ref 4.8–5.6)
Mean Plasma Glucose: 145.59 mg/dL

## 2021-06-15 NOTE — Progress Notes (Addendum)
PCP - Caren Macadam MD  Cardiologist - Jenkins Rouge MD  PPM/ICD - Denies Device Orders -  Rep Notified -   Chest x-ray - n/a EKG - 06/15/21 Stress Test - - Per patient, he believes one was done in 2000, results were normal. No results found in Chart Review or Care Everywhere ECHO - 12/11/18 Cardiac Cath - no   Sleep Study - denies CPAP -   Fasting Blood Sugar - 90-105 Checks Blood Sugar once every 3 months  Blood Thinner Instructions: n/a Aspirin Instructions:n/a  ERAS Protcol -clear liquids until 0830 PRE-SURGERY Ensure or G2- G2  COVID TEST- n/a-ambulatory surgery   Anesthesia review: yes-cardiac history  Patient denies shortness of breath, fever, cough and chest pain at PAT appointment   All instructions explained to the patient, with a verbal understanding of the material. Patient agrees to go over the instructions while at home for a better understanding. Patient also instructed to self quarantine after being tested for COVID-19. The opportunity to ask questions was provided.

## 2021-06-16 NOTE — Anesthesia Preprocedure Evaluation (Addendum)
Anesthesia Evaluation  Patient identified by MRN, date of birth, ID band Patient awake    Reviewed: Allergy & Precautions, NPO status , Patient's Chart, lab work & pertinent test results  History of Anesthesia Complications (+) PROLONGED EMERGENCE and history of anesthetic complications  Airway Mallampati: II  TM Distance: >3 FB Neck ROM: Limited    Dental  (+) Teeth Intact, Dental Advisory Given   Pulmonary sleep apnea , former smoker,    Pulmonary exam normal breath sounds clear to auscultation       Cardiovascular hypertension, Pt. on home beta blockers Normal cardiovascular exam Rhythm:Regular Rate:Normal     Neuro/Psych  Neuromuscular disease negative psych ROS   GI/Hepatic Neg liver ROS, GERD  Medicated,  Endo/Other  diabetes, Type 2Hyperthyroidism Obesity   Renal/GU negative Renal ROS     Musculoskeletal  (+) Arthritis ,   Abdominal   Peds  Hematology negative hematology ROS (+)   Anesthesia Other Findings Day of surgery medications reviewed with the patient.  Reproductive/Obstetrics                           Anesthesia Physical Anesthesia Plan  ASA: 3  Anesthesia Plan: General   Post-op Pain Management:    Induction: Intravenous  PONV Risk Score and Plan: 2 and Midazolam, Dexamethasone and Ondansetron  Airway Management Planned: Oral ETT and Video Laryngoscope Planned  Additional Equipment:   Intra-op Plan:   Post-operative Plan: Extubation in OR  Informed Consent: I have reviewed the patients History and Physical, chart, labs and discussed the procedure including the risks, benefits and alternatives for the proposed anesthesia with the patient or authorized representative who has indicated his/her understanding and acceptance.     Dental advisory given  Plan Discussed with: CRNA  Anesthesia Plan Comments: (PAT note written 06/16/2021 by Myra Gianotti, PA-C. )        Anesthesia Quick Evaluation
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
## 2021-06-18 ENCOUNTER — Encounter (HOSPITAL_COMMUNITY)
Admission: RE | Disposition: A | Discharge status: Home / Self Care | Payer: Self-pay | Source: Home / Self Care | Attending: Orthopedic Surgery

## 2021-06-18 ENCOUNTER — Ambulatory Visit (HOSPITAL_COMMUNITY): Payer: Worker's Compensation | Admitting: Anesthesiology

## 2021-06-18 ENCOUNTER — Encounter (HOSPITAL_COMMUNITY): Payer: Self-pay | Admitting: Orthopedic Surgery

## 2021-06-18 ENCOUNTER — Ambulatory Visit (HOSPITAL_COMMUNITY): Payer: Worker's Compensation | Attending: Orthopedic Surgery

## 2021-06-18 ENCOUNTER — Ambulatory Visit (HOSPITAL_COMMUNITY): Payer: Worker's Compensation | Admitting: Vascular Surgery

## 2021-06-18 ENCOUNTER — Other Ambulatory Visit (HOSPITAL_COMMUNITY): Payer: Self-pay

## 2021-06-18 ENCOUNTER — Ambulatory Visit (HOSPITAL_COMMUNITY)
Admission: RE | Admit: 2021-06-18 | Discharge: 2021-06-18 | Disposition: A | Discharge status: Home / Self Care | Payer: Worker's Compensation | Attending: Orthopedic Surgery | Admitting: Orthopedic Surgery

## 2021-06-18 ENCOUNTER — Other Ambulatory Visit: Payer: Self-pay

## 2021-06-18 DIAGNOSIS — R937 Abnormal findings on diagnostic imaging of other parts of musculoskeletal system: Secondary | ICD-10-CM | POA: Insufficient documentation

## 2021-06-18 DIAGNOSIS — Z79899 Other long term (current) drug therapy: Secondary | ICD-10-CM | POA: Diagnosis not present

## 2021-06-18 DIAGNOSIS — Z981 Arthrodesis status: Secondary | ICD-10-CM | POA: Diagnosis not present

## 2021-06-18 DIAGNOSIS — M50021 Cervical disc disorder at C4-C5 level with myelopathy: Secondary | ICD-10-CM | POA: Insufficient documentation

## 2021-06-18 DIAGNOSIS — Z87891 Personal history of nicotine dependence: Secondary | ICD-10-CM | POA: Insufficient documentation

## 2021-06-18 DIAGNOSIS — Z419 Encounter for procedure for purposes other than remedying health state, unspecified: Secondary | ICD-10-CM | POA: Insufficient documentation

## 2021-06-18 HISTORY — PX: ANTERIOR CERVICAL DECOMP/DISCECTOMY FUSION: SHX1161

## 2021-06-18 LAB — GLUCOSE, CAPILLARY
Glucose-Capillary: 163 mg/dL — ABNORMAL HIGH (ref 70–99)
Glucose-Capillary: 125 mg/dL — ABNORMAL HIGH (ref 70–99)

## 2021-06-18 SURGERY — ANTERIOR CERVICAL DECOMPRESSION/DISCECTOMY FUSION 1 LEVEL
Anesthesia: General

## 2021-06-18 MED ORDER — LACTATED RINGERS IV SOLN: INTRAVENOUS | Status: DC | PRN

## 2021-06-18 MED ORDER — METHOCARBAMOL 500 MG PO TABS
500.0000 mg | ORAL_TABLET | Freq: Four times a day (QID) | ORAL | 0 refills | Status: DC | PRN
Start: 2021-06-18 — End: 2021-10-09
  Filled 2021-06-18: qty 60, 15d supply, fill #0

## 2021-06-18 MED ORDER — PROPOFOL 10 MG/ML IV BOLUS
INTRAVENOUS | Status: DC | PRN
  Administered 2021-06-18: 150 mg via INTRAVENOUS

## 2021-06-18 MED ORDER — FENTANYL CITRATE (PF) 250 MCG/5ML IJ SOLN
INTRAMUSCULAR | Status: AC
  Filled 2021-06-18: qty 5

## 2021-06-18 MED ORDER — BUPIVACAINE-EPINEPHRINE 0.25% -1:200000 IJ SOLN
INTRAMUSCULAR | Status: DC | PRN
  Administered 2021-06-18: 5 mL

## 2021-06-18 MED ORDER — THROMBIN 20000 UNITS EX SOLR
CUTANEOUS | Status: DC | PRN
  Administered 2021-06-18: 20 mL via TOPICAL

## 2021-06-18 MED ORDER — CHLORHEXIDINE GLUCONATE 0.12 % MT SOLN
OROMUCOSAL | Status: AC
  Administered 2021-06-18: 15 mL
  Filled 2021-06-18: qty 15

## 2021-06-18 MED ORDER — ACETAMINOPHEN 500 MG PO TABS
1000.0000 mg | ORAL_TABLET | Freq: Once | ORAL | Status: AC
  Administered 2021-06-18: 1000 mg via ORAL
  Filled 2021-06-18: qty 2

## 2021-06-18 MED ORDER — ROCURONIUM BROMIDE 10 MG/ML (PF) SYRINGE
PREFILLED_SYRINGE | INTRAVENOUS | Status: AC
  Filled 2021-06-18: qty 10

## 2021-06-18 MED ORDER — POVIDONE-IODINE 7.5 % EX SOLN
Freq: Once | CUTANEOUS | Status: DC
  Filled 2021-06-18: qty 118

## 2021-06-18 MED ORDER — LIDOCAINE 2% (20 MG/ML) 5 ML SYRINGE
INTRAMUSCULAR | Status: AC
  Filled 2021-06-18: qty 5

## 2021-06-18 MED ORDER — FENTANYL CITRATE (PF) 100 MCG/2ML IJ SOLN
INTRAMUSCULAR | Status: AC
  Filled 2021-06-18: qty 2

## 2021-06-18 MED ORDER — PHENYLEPHRINE HCL-NACL 20-0.9 MG/250ML-% IV SOLN
INTRAVENOUS | Status: DC | PRN
  Administered 2021-06-18: 25 ug/min via INTRAVENOUS

## 2021-06-18 MED ORDER — HYDROCODONE-ACETAMINOPHEN 5-325 MG PO TABS
1.0000 | ORAL_TABLET | Freq: Four times a day (QID) | ORAL | 0 refills | Status: DC | PRN
  Filled 2021-06-18: qty 20, 5d supply, fill #0

## 2021-06-18 MED ORDER — HEMOSTATIC AGENTS (NO CHARGE) OPTIME
TOPICAL | Status: DC | PRN
  Administered 2021-06-18: 1 via TOPICAL

## 2021-06-18 MED ORDER — FENTANYL CITRATE (PF) 100 MCG/2ML IJ SOLN
25.0000 ug | INTRAMUSCULAR | Status: DC | PRN
  Administered 2021-06-18 (×3): 50 ug via INTRAVENOUS

## 2021-06-18 MED ORDER — DEXAMETHASONE SODIUM PHOSPHATE 10 MG/ML IJ SOLN
INTRAMUSCULAR | Status: AC
  Filled 2021-06-18: qty 1

## 2021-06-18 MED ORDER — LIDOCAINE 2% (20 MG/ML) 5 ML SYRINGE
INTRAMUSCULAR | Status: DC | PRN
  Administered 2021-06-18: 100 mg via INTRAVENOUS

## 2021-06-18 MED ORDER — PHENYLEPHRINE 40 MCG/ML (10ML) SYRINGE FOR IV PUSH (FOR BLOOD PRESSURE SUPPORT)
PREFILLED_SYRINGE | INTRAVENOUS | Status: AC
  Filled 2021-06-18: qty 10

## 2021-06-18 MED ORDER — METHOCARBAMOL 500 MG PO TABS
ORAL_TABLET | ORAL | Status: AC
  Filled 2021-06-18: qty 1

## 2021-06-18 MED ORDER — BUPIVACAINE-EPINEPHRINE (PF) 0.25% -1:200000 IJ SOLN
INTRAMUSCULAR | Status: AC
  Filled 2021-06-18: qty 30

## 2021-06-18 MED ORDER — ROCURONIUM BROMIDE 10 MG/ML (PF) SYRINGE
PREFILLED_SYRINGE | INTRAVENOUS | Status: DC | PRN
  Administered 2021-06-18: 65 mg via INTRAVENOUS
  Administered 2021-06-18: 10 mg via INTRAVENOUS
  Administered 2021-06-18: 15 mg via INTRAVENOUS

## 2021-06-18 MED ORDER — METHOCARBAMOL 500 MG PO TABS
500.0000 mg | ORAL_TABLET | Freq: Once | ORAL | Status: AC
  Administered 2021-06-18: 500 mg via ORAL

## 2021-06-18 MED ORDER — PROPOFOL 10 MG/ML IV BOLUS
INTRAVENOUS | Status: AC
  Filled 2021-06-18: qty 40

## 2021-06-18 MED ORDER — MIDAZOLAM HCL 5 MG/5ML IJ SOLN
INTRAMUSCULAR | Status: DC | PRN
  Administered 2021-06-18: 1 mg via INTRAVENOUS

## 2021-06-18 MED ORDER — PHENYLEPHRINE 40 MCG/ML (10ML) SYRINGE FOR IV PUSH (FOR BLOOD PRESSURE SUPPORT)
PREFILLED_SYRINGE | INTRAVENOUS | Status: DC | PRN
  Administered 2021-06-18 (×2): 80 ug via INTRAVENOUS
  Administered 2021-06-18 (×2): 120 ug via INTRAVENOUS

## 2021-06-18 MED ORDER — ONDANSETRON HCL 4 MG/2ML IJ SOLN
INTRAMUSCULAR | Status: AC
  Filled 2021-06-18: qty 2

## 2021-06-18 MED ORDER — MIDAZOLAM HCL 2 MG/2ML IJ SOLN
INTRAMUSCULAR | Status: AC
  Filled 2021-06-18: qty 2

## 2021-06-18 MED ORDER — CEFAZOLIN IN SODIUM CHLORIDE 3-0.9 GM/100ML-% IV SOLN
3.0000 g | INTRAVENOUS | Status: AC
  Administered 2021-06-18: 3 g via INTRAVENOUS
  Filled 2021-06-18: qty 100

## 2021-06-18 MED ORDER — ONDANSETRON HCL 4 MG/2ML IJ SOLN
INTRAMUSCULAR | Status: DC | PRN
  Administered 2021-06-18: 4 mg via INTRAVENOUS

## 2021-06-18 MED ORDER — THROMBIN 20000 UNITS EX SOLR
CUTANEOUS | Status: AC
  Filled 2021-06-18: qty 20000

## 2021-06-18 MED ORDER — FENTANYL CITRATE (PF) 250 MCG/5ML IJ SOLN
INTRAMUSCULAR | Status: DC | PRN
  Administered 2021-06-18 (×2): 50 ug via INTRAVENOUS

## 2021-06-18 MED ORDER — PROMETHAZINE HCL 25 MG/ML IJ SOLN: 6.2500 mg | INTRAMUSCULAR | Status: DC | PRN

## 2021-06-18 SURGICAL SUPPLY — 68 items
BAG COUNTER SPONGE SURGICOUNT (BAG) ×2 IMPLANT
BAG SURGICOUNT SPONGE COUNTING (BAG) ×1
BENZOIN TINCTURE PRP APPL 2/3 (GAUZE/BANDAGES/DRESSINGS) ×3 IMPLANT
BIT DRILL NEURO 2X3.1 SFT TUCH (MISCELLANEOUS) ×1 IMPLANT
BIT DRILL SKYLINE 16MM (BIT) ×1 IMPLANT
BLADE CLIPPER SURG (BLADE) ×3 IMPLANT
BLADE SURG 15 STRL LF DISP TIS (BLADE) ×1 IMPLANT
BLADE SURG 15 STRL SS (BLADE) ×2
BONE VIVIGEN FORMABLE 1.3CC (Bone Implant) ×3 IMPLANT
CARTRIDGE OIL MAESTRO DRILL (MISCELLANEOUS) ×1 IMPLANT
CLOSURE STERI-STRIP 1/2X4 (GAUZE/BANDAGES/DRESSINGS) ×1
CLOSURE WOUND 1/2 X4 (GAUZE/BANDAGES/DRESSINGS) ×1
CLSR STERI-STRIP ANTIMIC 1/2X4 (GAUZE/BANDAGES/DRESSINGS) ×2 IMPLANT
CORD BIPOLAR FORCEPS 12FT (ELECTRODE) ×3 IMPLANT
COVER SURGICAL LIGHT HANDLE (MISCELLANEOUS) ×3 IMPLANT
DEVICE ENDSKLTN MED 6 7MM (Orthopedic Implant) ×1 IMPLANT
DIFFUSER DRILL AIR PNEUMATIC (MISCELLANEOUS) ×3 IMPLANT
DRAPE C-ARM 42X72 X-RAY (DRAPES) ×3 IMPLANT
DRAPE POUCH INSTRU U-SHP 10X18 (DRAPES) ×3 IMPLANT
DRAPE SURG 17X23 STRL (DRAPES) ×9 IMPLANT
DRILL NEURO 2X3.1 SOFT TOUCH (MISCELLANEOUS) ×3
DRILL SKYLINE 16MM (BIT) ×3
DURAPREP 26ML APPLICATOR (WOUND CARE) ×3 IMPLANT
ELECT COATED BLADE 2.86 ST (ELECTRODE) ×3 IMPLANT
ELECT REM PT RETURN 9FT ADLT (ELECTROSURGICAL) ×3
ELECTRODE REM PT RTRN 9FT ADLT (ELECTROSURGICAL) ×1 IMPLANT
ENDOSKELETON MED 6 7MM (Orthopedic Implant) ×3 IMPLANT
GAUZE 4X4 16PLY ~~LOC~~+RFID DBL (SPONGE) ×3 IMPLANT
GAUZE SPONGE 4X4 12PLY STRL (GAUZE/BANDAGES/DRESSINGS) ×3 IMPLANT
GLOVE SRG 8 PF TXTR STRL LF DI (GLOVE) ×1 IMPLANT
GLOVE SURG ENC MOIS LTX SZ7 (GLOVE) ×3 IMPLANT
GLOVE SURG ENC MOIS LTX SZ8 (GLOVE) ×3 IMPLANT
GLOVE SURG UNDER POLY LF SZ7 (GLOVE) ×6 IMPLANT
GLOVE SURG UNDER POLY LF SZ8 (GLOVE) ×3
GOWN STRL REUS W/ TWL LRG LVL3 (GOWN DISPOSABLE) ×1 IMPLANT
GOWN STRL REUS W/ TWL XL LVL3 (GOWN DISPOSABLE) ×1 IMPLANT
GOWN STRL REUS W/TWL LRG LVL3 (GOWN DISPOSABLE) ×2
GOWN STRL REUS W/TWL XL LVL3 (GOWN DISPOSABLE) ×2
IV CATH 14GX2 1/4 (CATHETERS) ×3 IMPLANT
KIT BASIN OR (CUSTOM PROCEDURE TRAY) ×3 IMPLANT
KIT TURNOVER KIT B (KITS) ×3 IMPLANT
MANIFOLD NEPTUNE II (INSTRUMENTS) ×3 IMPLANT
NEEDLE PRECISIONGLIDE 27X1.5 (NEEDLE) ×3 IMPLANT
NEEDLE SPNL 20GX3.5 QUINCKE YW (NEEDLE) ×3 IMPLANT
NS IRRIG 1000ML POUR BTL (IV SOLUTION) ×3 IMPLANT
OIL CARTRIDGE MAESTRO DRILL (MISCELLANEOUS) ×3
PACK ORTHO CERVICAL (CUSTOM PROCEDURE TRAY) ×3 IMPLANT
PAD ARMBOARD 7.5X6 YLW CONV (MISCELLANEOUS) ×6 IMPLANT
PIN DISTRACTION 14 (PIN) ×6 IMPLANT
PLATE SKYLINE 12MM (Plate) ×3 IMPLANT
POSITIONER HEAD DONUT 9IN (MISCELLANEOUS) ×3 IMPLANT
SCREW RESCUE SKYLINE 16MM (Screw) ×12 IMPLANT
SPONGE INTESTINAL PEANUT (DISPOSABLE) ×3 IMPLANT
SPONGE SURGIFOAM ABS GEL 100 (HEMOSTASIS) ×3 IMPLANT
STRIP CLOSURE SKIN 1/2X4 (GAUZE/BANDAGES/DRESSINGS) ×2 IMPLANT
SURGIFLO W/THROMBIN 8M KIT (HEMOSTASIS) ×3 IMPLANT
SUT MNCRL AB 4-0 PS2 18 (SUTURE) ×3 IMPLANT
SUT VIC AB 2-0 CT2 18 VCP726D (SUTURE) ×3 IMPLANT
SYR BULB IRRIG 60ML STRL (SYRINGE) ×3 IMPLANT
SYR CONTROL 10ML LL (SYRINGE) ×6 IMPLANT
TAPE CLOTH 4X10 WHT NS (GAUZE/BANDAGES/DRESSINGS) ×3 IMPLANT
TAPE CLOTH SURG 4X10 WHT LF (GAUZE/BANDAGES/DRESSINGS) ×3 IMPLANT
TAPE UMBILICAL 1/8X18 (MISCELLANEOUS) ×3 IMPLANT
TAPE UMBILICAL COTTON 1/8X30 (MISCELLANEOUS) ×3 IMPLANT
TOWEL GREEN STERILE (TOWEL DISPOSABLE) ×3 IMPLANT
TOWEL GREEN STERILE FF (TOWEL DISPOSABLE) ×3 IMPLANT
WATER STERILE IRR 1000ML POUR (IV SOLUTION) ×3 IMPLANT
YANKAUER SUCT BULB TIP NO VENT (SUCTIONS) ×3 IMPLANT

## 2021-06-18 NOTE — Anesthesia Procedure Notes (Signed)
Procedure Name: Intubation Date/Time: 06/18/2021 12:19 PM Performed by: Jenne Campus, CRNA Pre-anesthesia Checklist: Patient identified, Emergency Drugs available, Suction available and Patient being monitored Patient Re-evaluated:Patient Re-evaluated prior to induction Oxygen Delivery Method: Circle System Utilized Preoxygenation: Pre-oxygenation with 100% oxygen Induction Type: IV induction Ventilation: Mask ventilation without difficulty and Oral airway inserted - appropriate to patient size Laryngoscope Size: Glidescope and 4 Grade View: Grade I Tube type: Oral Tube size: 8.0 mm Number of attempts: 1 Airway Equipment and Method: Oral airway, Rigid stylet and Video-laryngoscopy Placement Confirmation: ETT inserted through vocal cords under direct vision, positive ETCO2 and breath sounds checked- equal and bilateral Secured at: 22 cm Tube secured with: Tape Dental Injury: Teeth and Oropharynx as per pre-operative assessment  Comments: Elective video-glide d/t pat presenting for ACDF. Head/neck remained neutral during induction and intubation.

## 2021-06-18 NOTE — H&P (Signed)
PREOPERATIVE H&P  Chief Complaint: Bilateral arm and leg weakness  HPI: Jeffrey Price is a 65 y.o. male who presents with ongoing weakness  MRI reveals spinal cord compression at C4/5  Patient has failed multiple forms of conservative care and continues to have pain (see office notes for additional details regarding the patient's full course of treatment)  Past Medical History:  Diagnosis Date   Arthritis    in back   Blood clot in vein 1995   bil legs   Bulging lumbar disc    Cataract    mild   Cellulitis 2012   forehead   Complication of anesthesia    had trouble waking up 06/11/2020   DDD (degenerative disc disease), lumbar    GERD (gastroesophageal reflux disease)    Graves disease    Hx of adenomatous colonic polyps 12/15/2017   Hyperlipidemia    Hypertension    Neuromuscular disorder ([place])    neuropathy in feet   Pre-diabetes    Thyrotoxicosis without thyroid storm    Varicose veins of both lower extremities    Past Surgical History:  Procedure Laterality Date   BACK SURGERY  06/11/2020   ENDOVENOUS ABLATION SAPHENOUS VEIN W/ LASER Left 10/18/2019   endovenous laser ablation left greater saphenous vein and stab phlebectomy > 20 incisions left leg by Gae Gallop MD    ENDOVENOUS ABLATION SAPHENOUS VEIN W/ LASER Right 11/08/2019   endovenous laser ablation right greater saphenous vein and stab phlebectomy 10-20 incisions right leg by Gae Gallop MD    LUMBAR EPIDURAL INJECTION     Social History   Socioeconomic History   Marital status: Widowed    Spouse name: Not on file   Number of children: Not on file   Years of education: Not on file   Highest education level: Not on file  Occupational History   Not on file  Tobacco Use   Smoking status: Former    Types: Cigarettes    Quit date: 12/03/2011    Years since quitting: 9.5   Smokeless tobacco: Never   Tobacco comments:    occasional vaping  Vaping Use   Vaping Use: Every day   Substances:  Nicotine, Flavoring  Substance and Sexual Activity   Alcohol use: Yes    Alcohol/week: 8.0 - 10.0 standard drinks    Types: 8 - 10 Standard drinks or equivalent per week    Comment: , whiskey, [place], 30 drinks in a months time   Drug use: No   Sexual activity: Not on file  Other Topics Concern   Not on file  Social History Narrative   Patient is widowed.  He has 1 daughter.  He is a Programmer, systems.  He is vapes.  2-3 drinks of bourbon daily when not driving.  4-5 caffeinated beverages daily.  No drug use.      11/02/2017   Social Determinants of Health   Financial Resource Strain: Not on file  Food Insecurity: Not on file  Transportation Needs: Not on file  Physical Activity: Not on file  Stress: Not on file  Social Connections: Not on file   Family History  Problem Relation Age of Onset   Thyroid disease Mother    Parkinson's disease Father    Colon cancer Neg Hx    Rectal cancer Neg Hx    Stomach cancer Neg Hx    Esophageal cancer Neg Hx    No Known Allergies Prior to Admission medications  Medication Sig Start Date End Date Taking? Authorizing Provider  atenolol (TENORMIN) 100 MG tablet TAKE 1 TABLET BY MOUTH ONCE A DAY (PT NEEDS APPT) Patient taking differently: Take 100 mg by mouth daily. 04/22/21 04/22/22 Yes Koberlein, Junell C, MD  atorvastatin (LIPITOR) 20 MG tablet TAKE 1 TABLET BY MOUTH ONCE DAILY Patient taking differently: Take 20 mg by mouth daily. 05/12/21 05/12/22 Yes Koberlein, Steele Berg, MD  clotrimazole (CLOTRIMAZOLE AF) 1 % cream Apply 1 application topically 2 (two) times daily. Patient taking differently: Apply 1 application topically 2 (two) times daily as needed (rash). 03/30/21  Yes Koberlein, Junell C, MD  cyclobenzaprine (FLEXERIL) 10 MG tablet TAKE 1 TABLET BY MOUTH 3 TIMES DAILY AS NEEDED FOR MUSCLE SPASMS Patient taking differently: Take 10 mg by mouth at bedtime as needed for muscle spasms. 02/16/21 02/16/22 Yes Koberlein, Junell C, MD   doxycycline (VIBRA-TABS) 100 MG tablet TAKE 1 TABLET BY MOUTH ONCE DAILY 05/12/21 05/12/22 Yes Koberlein, Junell C, MD  furosemide (LASIX) 40 MG tablet TAKE 1 TO 2 TABLETS BY MOUTH DAILY Patient taking differently: Take 80 mg by mouth daily. 03/30/21 03/30/22 Yes Koberlein, Steele Berg, MD  HYDROcodone-acetaminophen (NORCO/VICODIN) 5-325 MG tablet Take 1 tablet by mouth every 6 (six) hours as needed. 04/08/21  Yes Valarie Merino, MD  meloxicam (MOBIC) 15 MG tablet TAKE 1 TABLET BY MOUTH ONCE DAILY AS NEEDED FOR INFLAMMATION Patient taking differently: Take 15 mg by mouth daily as needed for pain. 04/22/21 04/22/22 Yes Koberlein, Steele Berg, MD  methimazole (TAPAZOLE) 5 MG tablet TAKE 1 TABLET BY MOUTH IN THE MORNING Patient taking differently: Take 5 mg by mouth every morning. 11/26/20 11/26/21 Yes Koberlein, Junell C, MD  metroNIDAZOLE (METROGEL) 1 % gel Apply topically daily. Patient taking differently: Apply 1 application topically daily as needed (rash). 08/24/19  Yes Koberlein, Junell C, MD  pantoprazole (PROTONIX) 40 MG tablet TAKE 1 TABLET BY MOUTH DAILY * PT NEEDS APPT Patient taking differently: Take 40 mg by mouth daily. 03/30/21 03/30/22 Yes Koberlein, Junell C, MD  pregabalin (LYRICA) 200 MG capsule TAKE 1 CAPSULE BY MOUTH TWO TIMES DAILY * DO NOT TAKE GABAPENTIN WHILE TAKING THIS MED Patient taking differently: Take 200 mg by mouth 2 (two) times daily. 03/30/21 09/26/21 Yes Koberlein, Steele Berg, MD  testosterone cypionate (DEPOTESTOSTERONE CYPIONATE) 200 MG/ML injection INJECT 1 ML INTO THE MUSCLE EVERY 14 DAYS Patient taking differently: Inject 200 mg into the muscle every 14 (fourteen) days. 08/24/20 06/09/21 Yes Koberlein, Steele Berg, MD  traMADol (ULTRAM) 50 MG tablet Take 1-2 tablet by mouth three times a day as needed for pain Patient taking differently: Take 50-100 mg by mouth 3 (three) times daily as needed for severe pain. 05/01/21  Yes   cyclobenzaprine (FLEXERIL) 10 MG tablet Take 1 tablet (10 mg  total) by mouth 2 (two) times daily as needed for muscle spasms. Patient not taking: No sig reported 04/08/21   Valarie Merino, MD  fenofibrate (TRICOR) 48 MG tablet Take 1 tablet (48 mg total) by mouth daily. Patient not taking: Reported on 06/09/2021 08/11/20   Caren Macadam, MD  NEEDLE, DISP, 22 G (BD ECLIPSE NEEDLE) 22G X 1-1/2" MISC Use as directed 08/02/19   Caren Macadam, MD  oxyCODONE-acetaminophen (PERCOCET/ROXICET) 5-325 MG tablet Take 1-2 tablets by mouth every 4 (four) hours as needed for moderate pain or severe pain. 06/12/20   Phylliss Bob, MD  Syringe, Disposable, 3 ML MISC Use as directed 08/02/19   Koberlein, Steele Berg,  MD     All other systems have been reviewed and were otherwise negative with the exception of those mentioned in the HPI and as above.  Physical Exam: There were no vitals filed for this visit.  There is no height or weight on file to calculate BMI.  General: Alert, no acute distress Cardiovascular: No pedal edema Respiratory: No cyanosis, no use of accessory musculature Skin: No lesions in the area of chief complaint Neurologic: Sensation intact distally Psychiatric: Patient is competent for consent with normal mood and affect Lymphatic: No axillary or cervical lymphadenopathy   Assessment/Plan: CERVICAL MYELOPATHY Plan for Procedure(s): ANTERIOR CERVICAL [place] 5 WITH INSTRUEMENTATION AND ALLOGRAFT   Norva Karvonen, MD 06/18/2021 8:00 AM

## 2021-06-18 NOTE — Op Note (Signed)
PATIENT NAME: Jeffrey Price RECORD NO.:   [id number]    DATE OF BIRTH: [birth year]   DATE OF PROCEDURE: 06/18/2021                               OPERATIVE REPORT     PREOPERATIVE DIAGNOSES: 1.  Cervical myelopathy 2.  C4-5 disc herniation resulting in spinal cord compression   POSTOPERATIVE DIAGNOSES: 1.  Cervical myelopathy 2.  C4-5 disc herniation resulting in spinal cord compression   PROCEDURE: 1. Anterior cervical decompression and fusion C4/5 2. Placement of anterior instrumentation, C4/5 3. Insertion of interbody device x1 (7m Titan intervertebral spacer). 4. Intraoperative use of fluoroscopy. 5. Use of morselized allograft - ViviGen.   SURGEON:  MPhylliss Bob MD   ASSISTANT:  KPricilla Holm PA-C.   ANESTHESIA:  General endotracheal anesthesia.   COMPLICATIONS:  None.   DISPOSITION:  Stable.   ESTIMATED BLOOD LOSS:  Minimal.   INDICATIONS FOR SURGERY:  Briefly, Jeffrey Price is a pleasant 89-year- old male, who did present to me with progressive myelopathic symptoms.   The patient's MRI did reveal the findings noted above.  Given the ongoing and progressive symptoms, in combination with his MRI findings, we did discuss proceeding with the procedure noted above.  The patient was fully aware of the risks and limitations of surgery as outlined in my preoperative note.   OPERATIVE DETAILS:  On 06/18/2021 , the patient was brought to surgery and general endotracheal anesthesia was administered.  The patient was placed supine on the hospital bed. The neck was gently extended.  All bony prominences were meticulously padded.  The neck was prepped and draped in the usual sterile fashion.  At this point, I did make a left-sided transverse incision.  The platysma was incised.  A Smith-Robinson approach was used and the anterior spine was identified. A self-retaining retractor was placed.  I then subperiosteally exposed the vertebral bodies from C4-5.  Caspar  pins were then placed into the C4 and C5 vertebral bodies and distraction was applied.  A thorough and complete C4-5 intervertebral diskectomy was performed.  The posterior longitudinal ligament was identified and entered using a nerve hook.  I then used #1 followed by #2 Kerrison to perform a thorough and complete intervertebral diskectomy.  The spinal canal was thoroughly decompressed, as was the left neuroforamen.  The endplates were then prepared and the appropriate-sized intervertebral spacer was then packed with ViviGen and tamped into position in the usual fashion. The Caspar pins  then were removed and bone wax was placed in their place.  The appropriate-sized anterior cervical plate was placed over the anterior spine.  16 mm variable angle screws were placed, 2 in each vertebral body from C4-5 for a total of 4 vertebral body screws.  The screws were then locked to the plate using the Cam locking mechanism.  I was very pleased with the final fluoroscopic images.  The wound was then irrigated.  The wound was then explored for any undue bleeding and there was no bleeding noted. The wound was then closed in layers using 2-0 Vicryl, followed by 4-0 Monocryl.  Benzoin and Steri-Strips were applied, followed by sterile dressing.  All instrument counts were correct at the termination of the procedure.   Of note, KPricilla Holm PA-C, was my assistant throughout surgery, and did aid in retraction, placement of the hardware, suctioning, and closure from start to  finish.         Phylliss Bob, MD

## 2021-06-18 NOTE — Transfer of Care (Signed)
Immediate Anesthesia Transfer of Care Note  Patient: Jeffrey Price  Procedure(s) Performed: ANTERIOR CERVICAL DECOMPRESSION FUSION CERVICAL4- CERVICAL 5 WITH INSTRUEMENTATION AND ALLOGRAFT  Patient Location: PACU  Anesthesia Type:General  Level of Consciousness: awake, oriented and patient cooperative  Airway & Oxygen Therapy: Patient Spontanous Breathing and Patient connected to face mask oxygen  Post-op Assessment: Report given to RN and Post -op Vital signs reviewed and stable  Post vital signs: Reviewed  Last Vitals:  Vitals Value Taken Time  BP 138/92 06/18/21 1419  Temp    Pulse 68 06/18/21 1428  Resp 19 06/18/21 1428  SpO2 97 % 06/18/21 1428  Vitals shown include unvalidated device data.  Last Pain:  Vitals:   06/18/21 0953  TempSrc:   PainSc: 2       Patients Stated Pain Goal: 2 ([id number])  Complications: No notable events documented.

## 2021-06-18 NOTE — Anesthesia Postprocedure Evaluation (Signed)
Anesthesia Post Note  Patient: Jeffrey Price  Procedure(s) Performed: ANTERIOR CERVICAL DECOMPRESSION FUSION CERVICAL4- CERVICAL 5 WITH INSTRUEMENTATION AND ALLOGRAFT     Patient location during evaluation: PACU Anesthesia Type: General Level of consciousness: awake and alert Pain management: pain level controlled Vital Signs Assessment: post-procedure vital signs reviewed and stable Respiratory status: spontaneous breathing, nonlabored ventilation, respiratory function stable and patient connected to nasal cannula oxygen Cardiovascular status: blood pressure returned to baseline and stable Postop Assessment: no apparent nausea or vomiting Anesthetic complications: no   No notable events documented.  Last Vitals:  Vitals:   06/18/21 1530 06/18/21 1535  BP:  127/75  Pulse: (!) 101 93  Resp: 14 14  Temp:  (!) 36.2 C  SpO2: 98% 96%    Last Pain:  Vitals:   06/18/21 1535  TempSrc:   PainSc: 4                  Catalina Gravel

## 2021-06-19 ENCOUNTER — Encounter (HOSPITAL_COMMUNITY): Payer: Self-pay | Admitting: Orthopedic Surgery

## 2021-06-22 ENCOUNTER — Other Ambulatory Visit: Payer: Self-pay | Admitting: Family Medicine

## 2021-06-22 ENCOUNTER — Other Ambulatory Visit (HOSPITAL_COMMUNITY): Payer: Self-pay

## 2021-06-22 DIAGNOSIS — K219 Gastro-esophageal reflux disease without esophagitis: Secondary | ICD-10-CM

## 2021-06-22 DIAGNOSIS — E0500 Thyrotoxicosis with diffuse goiter without thyrotoxic crisis or storm: Secondary | ICD-10-CM

## 2021-06-22 DIAGNOSIS — R7989 Other specified abnormal findings of blood chemistry: Secondary | ICD-10-CM

## 2021-06-22 MED ORDER — PANTOPRAZOLE SODIUM 40 MG PO TBEC
DELAYED_RELEASE_TABLET | ORAL | 0 refills | Status: DC
  Filled 2021-06-22: qty 90, 90d supply, fill #0

## 2021-06-22 MED ORDER — TESTOSTERONE CYPIONATE 200 MG/ML IM SOLN
INTRAMUSCULAR | 5 refills | Status: DC
  Filled 2021-06-22: qty 6, 84d supply, fill #0
  Filled 2021-10-13: qty 6, 84d supply, fill #1

## 2021-06-22 MED ORDER — METHIMAZOLE 5 MG PO TABS
ORAL_TABLET | Freq: Every morning | ORAL | 0 refills | Status: DC
  Filled 2021-06-22: qty 90, 90d supply, fill #0

## 2021-06-23 ENCOUNTER — Other Ambulatory Visit (HOSPITAL_COMMUNITY): Payer: Self-pay

## 2021-06-23 ENCOUNTER — Other Ambulatory Visit: Payer: Self-pay | Admitting: Family Medicine

## 2021-06-23 MED ORDER — FUROSEMIDE 40 MG PO TABS
ORAL_TABLET | Freq: Every day | ORAL | 0 refills | Status: DC
  Filled 2021-06-23: qty 180, 90d supply, fill #0

## 2021-06-24 ENCOUNTER — Other Ambulatory Visit (HOSPITAL_COMMUNITY): Payer: Self-pay

## 2021-06-24 MED ORDER — "BD LUER-LOK SYRINGE 22G X 1"" 3 ML MISC"
3 refills | Status: DC
  Filled 2021-06-24 (×2): qty 6, 84d supply, fill #0
  Filled 2021-10-26: qty 6, 84d supply, fill #1

## 2021-06-24 MED ORDER — TRIAMCINOLONE ACETONIDE 0.1 % EX CREA
TOPICAL_CREAM | CUTANEOUS | 2 refills | Status: DC
  Filled 2021-06-24: qty 45, 14d supply, fill #0

## 2021-06-24 MED ORDER — "BD HYPODERMIC NEEDLE 18G X 1"" MISC"
3 refills | Status: DC
  Filled 2021-06-24: qty 6, 84d supply, fill #0
  Filled 2021-10-13: qty 6, 84d supply, fill #1
  Filled 2021-10-26: qty 6, 84d supply, fill #2

## 2021-06-30 ENCOUNTER — Other Ambulatory Visit: Payer: Self-pay | Admitting: Family Medicine

## 2021-06-30 ENCOUNTER — Other Ambulatory Visit (HOSPITAL_COMMUNITY): Payer: Self-pay

## 2021-06-30 DIAGNOSIS — I10 Essential (primary) hypertension: Secondary | ICD-10-CM

## 2021-06-30 MED ORDER — ATENOLOL 100 MG PO TABS
ORAL_TABLET | ORAL | 0 refills | Status: DC
  Filled 2021-06-30: qty 90, 90d supply, fill #0

## 2021-07-01 ENCOUNTER — Other Ambulatory Visit (HOSPITAL_COMMUNITY): Payer: Self-pay

## 2021-07-01 MED ORDER — METHOCARBAMOL 500 MG PO TABS
ORAL_TABLET | ORAL | 1 refills | Status: DC
  Filled 2021-07-01: qty 60, 15d supply, fill #0

## 2021-07-01 MED ORDER — ACETAMINOPHEN-CODEINE #3 300-30 MG PO TABS
ORAL_TABLET | ORAL | 0 refills | Status: DC
  Filled 2021-07-01: qty 60, 8d supply, fill #0

## 2021-07-01 MED ORDER — CYCLOBENZAPRINE HCL 5 MG PO TABS
ORAL_TABLET | ORAL | 0 refills | Status: DC
  Filled 2021-07-01: qty 60, 30d supply, fill #0

## 2021-07-30 ENCOUNTER — Other Ambulatory Visit: Payer: Self-pay | Admitting: Family Medicine

## 2021-07-30 ENCOUNTER — Other Ambulatory Visit (HOSPITAL_COMMUNITY): Payer: Self-pay

## 2021-07-30 DIAGNOSIS — E785 Hyperlipidemia, unspecified: Secondary | ICD-10-CM

## 2021-07-30 MED ORDER — ATORVASTATIN CALCIUM 20 MG PO TABS
ORAL_TABLET | Freq: Every day | ORAL | 0 refills | Status: DC
  Filled 2021-07-30: qty 90, 90d supply, fill #0

## 2021-09-15 ENCOUNTER — Other Ambulatory Visit (HOSPITAL_COMMUNITY): Payer: Self-pay

## 2021-09-15 ENCOUNTER — Other Ambulatory Visit: Payer: Self-pay | Admitting: Family Medicine

## 2021-09-15 DIAGNOSIS — K219 Gastro-esophageal reflux disease without esophagitis: Secondary | ICD-10-CM

## 2021-09-15 DIAGNOSIS — E0500 Thyrotoxicosis with diffuse goiter without thyrotoxic crisis or storm: Secondary | ICD-10-CM

## 2021-09-15 MED ORDER — METHIMAZOLE 5 MG PO TABS
ORAL_TABLET | Freq: Every morning | ORAL | 0 refills | Status: DC
Start: 2021-09-15 — End: 2021-10-19
  Filled 2021-09-15: qty 30, 30d supply, fill #0

## 2021-09-15 MED ORDER — PANTOPRAZOLE SODIUM 40 MG PO TBEC
DELAYED_RELEASE_TABLET | ORAL | 0 refills | Status: DC
  Filled 2021-09-15: qty 30, 30d supply, fill #0

## 2021-09-15 NOTE — Telephone Encounter (Signed)
Spoke with the patient and scheduled an appt on 12/2 at 11:30am.

## 2021-09-15 NOTE — Telephone Encounter (Signed)
Patient has not been seen in over a year. I have sent in 30 tablets but needs appointment and has none scheduled.

## 2021-09-17 ENCOUNTER — Other Ambulatory Visit (HOSPITAL_COMMUNITY): Payer: Self-pay

## 2021-09-18 ENCOUNTER — Other Ambulatory Visit (HOSPITAL_COMMUNITY): Payer: Self-pay

## 2021-09-19 ENCOUNTER — Other Ambulatory Visit (HOSPITAL_COMMUNITY): Payer: Self-pay

## 2021-09-21 ENCOUNTER — Other Ambulatory Visit (HOSPITAL_COMMUNITY): Payer: Self-pay

## 2021-09-24 ENCOUNTER — Other Ambulatory Visit (HOSPITAL_COMMUNITY): Payer: Self-pay

## 2021-09-25 ENCOUNTER — Other Ambulatory Visit (HOSPITAL_COMMUNITY): Payer: Self-pay

## 2021-09-29 ENCOUNTER — Other Ambulatory Visit (HOSPITAL_COMMUNITY): Payer: Self-pay

## 2021-09-30 ENCOUNTER — Other Ambulatory Visit (HOSPITAL_COMMUNITY): Payer: Self-pay

## 2021-09-30 ENCOUNTER — Other Ambulatory Visit: Payer: Self-pay | Admitting: Family Medicine

## 2021-09-30 DIAGNOSIS — G629 Polyneuropathy, unspecified: Secondary | ICD-10-CM

## 2021-09-30 DIAGNOSIS — I10 Essential (primary) hypertension: Secondary | ICD-10-CM

## 2021-09-30 MED ORDER — PREGABALIN 200 MG PO CAPS
ORAL_CAPSULE | ORAL | 0 refills | Status: DC
Start: 2021-09-30 — End: 2021-10-19
  Filled 2021-09-30: qty 60, 30d supply, fill #0

## 2021-09-30 MED ORDER — ATENOLOL 100 MG PO TABS
ORAL_TABLET | ORAL | 0 refills | Status: DC
Start: 2021-09-30 — End: 2021-10-19
  Filled 2021-09-30: qty 30, 30d supply, fill #0

## 2021-09-30 MED ORDER — FUROSEMIDE 40 MG PO TABS
ORAL_TABLET | Freq: Every day | ORAL | 0 refills | Status: DC
  Filled 2021-09-30: qty 60, 30d supply, fill #0

## 2021-10-02 ENCOUNTER — Other Ambulatory Visit (HOSPITAL_COMMUNITY): Payer: Self-pay

## 2021-10-05 ENCOUNTER — Other Ambulatory Visit (HOSPITAL_COMMUNITY): Payer: Self-pay

## 2021-10-08 ENCOUNTER — Other Ambulatory Visit: Payer: Self-pay | Admitting: Family Medicine

## 2021-10-08 ENCOUNTER — Other Ambulatory Visit (HOSPITAL_COMMUNITY): Payer: Self-pay

## 2021-10-09 ENCOUNTER — Ambulatory Visit: Payer: No Typology Code available for payment source | Admitting: Family Medicine

## 2021-10-09 ENCOUNTER — Other Ambulatory Visit (HOSPITAL_COMMUNITY): Payer: Self-pay

## 2021-10-09 VITALS — BP 116/80 | HR 79 | Temp 97.6°F | Ht 74.0 in | Wt 292.5 lb

## 2021-10-09 DIAGNOSIS — E785 Hyperlipidemia, unspecified: Secondary | ICD-10-CM | POA: Diagnosis not present

## 2021-10-09 DIAGNOSIS — R7989 Other specified abnormal findings of blood chemistry: Secondary | ICD-10-CM

## 2021-10-09 DIAGNOSIS — G6289 Other specified polyneuropathies: Secondary | ICD-10-CM | POA: Diagnosis not present

## 2021-10-09 DIAGNOSIS — E1169 Type 2 diabetes mellitus with other specified complication: Secondary | ICD-10-CM | POA: Diagnosis not present

## 2021-10-09 DIAGNOSIS — E1140 Type 2 diabetes mellitus with diabetic neuropathy, unspecified: Secondary | ICD-10-CM

## 2021-10-09 DIAGNOSIS — E538 Deficiency of other specified B group vitamins: Secondary | ICD-10-CM | POA: Diagnosis not present

## 2021-10-09 DIAGNOSIS — Z86010 Personal history of colonic polyps: Secondary | ICD-10-CM

## 2021-10-09 DIAGNOSIS — Z23 Encounter for immunization: Secondary | ICD-10-CM

## 2021-10-09 DIAGNOSIS — I10 Essential (primary) hypertension: Secondary | ICD-10-CM | POA: Diagnosis not present

## 2021-10-09 DIAGNOSIS — E1142 Type 2 diabetes mellitus with diabetic polyneuropathy: Secondary | ICD-10-CM

## 2021-10-09 DIAGNOSIS — E0500 Thyrotoxicosis with diffuse goiter without thyrotoxic crisis or storm: Secondary | ICD-10-CM

## 2021-10-09 DIAGNOSIS — R0683 Snoring: Secondary | ICD-10-CM

## 2021-10-09 LAB — CBC WITH DIFFERENTIAL/PLATELET
Basophils Absolute: 0 10*3/uL (ref 0.0–0.1)
Basophils Relative: 0.3 % (ref 0.0–3.0)
Eosinophils Absolute: 0 10*3/uL (ref 0.0–0.7)
Eosinophils Relative: 0.1 % (ref 0.0–5.0)
HCT: 50.3 % (ref 39.0–52.0)
Hemoglobin: 16.1 g/dL (ref 13.0–17.0)
Lymphocytes Relative: 23.5 % (ref 12.0–46.0)
Lymphs Abs: 2.6 10*3/uL (ref 0.7–4.0)
MCHC: 31.9 g/dL (ref 30.0–36.0)
MCV: 89.1 fl (ref 78.0–100.0)
Monocytes Absolute: 0.9 10*3/uL (ref 0.1–1.0)
Monocytes Relative: 8.1 % (ref 3.0–12.0)
Neutro Abs: 7.5 10*3/uL (ref 1.4–7.7)
Neutrophils Relative %: 68 % (ref 43.0–77.0)
Platelets: 216 10*3/uL (ref 150.0–400.0)
RBC: 5.65 Mil/uL (ref 4.22–5.81)
RDW: 15.4 % (ref 11.5–15.5)
WBC: 11 10*3/uL — ABNORMAL HIGH (ref 4.0–10.5)

## 2021-10-09 LAB — COMPREHENSIVE METABOLIC PANEL
ALT: 33 U/L (ref 0–53)
AST: 31 U/L (ref 0–37)
Albumin: 4.2 g/dL (ref 3.5–5.2)
Alkaline Phosphatase: 78 U/L (ref 39–117)
BUN: 18 mg/dL (ref 6–23)
CO2: 28 mEq/L (ref 19–32)
Calcium: 8.9 mg/dL (ref 8.4–10.5)
Chloride: 98 mEq/L (ref 96–112)
Creatinine, Ser: 1.02 mg/dL (ref 0.40–1.50)
GFR: 77.46 mL/min (ref >60.00)
Glucose, Bld: 102 mg/dL — ABNORMAL HIGH (ref 70–99)
Potassium: 4.4 mEq/L (ref 3.5–5.1)
Sodium: 135 mEq/L (ref 135–145)
Total Bilirubin: 0.7 mg/dL (ref 0.2–1.2)
Total Protein: 7.3 g/dL (ref 6.0–8.3)

## 2021-10-09 LAB — T4, FREE: Free T4: 0.48 ng/dL — ABNORMAL LOW (ref 0.60–1.60)

## 2021-10-09 LAB — LIPID PANEL
Cholesterol: 102 mg/dL (ref 0–200)
HDL: 35.6 mg/dL — ABNORMAL LOW (ref >39.00)
LDL Cholesterol: 40 mg/dL (ref 0–99)
NonHDL: 66.75
Total CHOL/HDL Ratio: 3
Triglycerides: 136 mg/dL (ref 0.0–149.0)
VLDL: 27.2 mg/dL (ref 0.0–40.0)

## 2021-10-09 LAB — MICROALBUMIN / CREATININE URINE RATIO
Creatinine,U: 43.3 mg/dL
Microalb Creat Ratio: 1.7 mg/g (ref 0.0–30.0)
Microalb, Ur: 0.7 mg/dL (ref 0.0–1.9)

## 2021-10-09 LAB — PSA: PSA: 0.42 ng/mL (ref 0.10–4.00)

## 2021-10-09 LAB — TSH: TSH: 2.16 u[IU]/mL (ref 0.35–5.50)

## 2021-10-09 LAB — HEMOGLOBIN A1C: Hgb A1c MFr Bld: 6.8 % — ABNORMAL HIGH (ref 4.6–6.5)

## 2021-10-09 LAB — FOLATE: Folate: 8 ng/mL (ref >5.9)

## 2021-10-09 LAB — T3, FREE: T3, Free: 3.3 pg/mL (ref 2.3–4.2)

## 2021-10-09 LAB — VITAMIN B12: Vitamin B-12: 207 pg/mL — ABNORMAL LOW (ref 211–911)

## 2021-10-09 LAB — TESTOSTERONE: Testosterone: 403.29 ng/dL (ref 300.00–890.00)

## 2021-10-09 MED ORDER — CLOTRIMAZOLE 1 % EX CREA
1.0000 "application " | TOPICAL_CREAM | Freq: Two times a day (BID) | CUTANEOUS | 2 refills | Status: DC | PRN
  Filled 2021-10-09: qty 45, 23d supply, fill #0

## 2021-10-09 MED ORDER — MELOXICAM 15 MG PO TABS
ORAL_TABLET | ORAL | 1 refills | Status: DC
  Filled 2021-10-09: qty 90, 90d supply, fill #0

## 2021-10-09 MED ORDER — CYCLOBENZAPRINE HCL 10 MG PO TABS
ORAL_TABLET | Freq: Three times a day (TID) | ORAL | 3 refills | Status: DC | PRN
  Filled 2021-10-09: qty 90, 30d supply, fill #0

## 2021-10-09 MED ORDER — TRIAMCINOLONE ACETONIDE 0.1 % EX CREA
TOPICAL_CREAM | CUTANEOUS | 2 refills | Status: DC
  Filled 2021-10-09: qty 45, 14d supply, fill #0

## 2021-10-09 NOTE — Telephone Encounter (Signed)
Shouldn't be on robaxin and flexeril. Does one work better for him?

## 2021-10-09 NOTE — Telephone Encounter (Signed)
Patient informed of the message below and stated he would prefer Flexeril.  Message sent to PCP.
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
## 2021-10-09 NOTE — Telephone Encounter (Signed)
Shouldn't be on robaxin and flexeril. Is there one that works better for him?

## 2021-10-12 ENCOUNTER — Other Ambulatory Visit (HOSPITAL_COMMUNITY): Payer: Self-pay

## 2021-10-13 ENCOUNTER — Other Ambulatory Visit (HOSPITAL_COMMUNITY): Payer: Self-pay

## 2021-10-14 ENCOUNTER — Other Ambulatory Visit (HOSPITAL_COMMUNITY): Payer: Self-pay

## 2021-10-19 ENCOUNTER — Encounter: Payer: Self-pay | Admitting: Family Medicine

## 2021-10-19 ENCOUNTER — Other Ambulatory Visit (HOSPITAL_COMMUNITY): Payer: Self-pay

## 2021-10-19 ENCOUNTER — Ambulatory Visit: Payer: No Typology Code available for payment source | Admitting: Family Medicine

## 2021-10-19 ENCOUNTER — Ambulatory Visit (INDEPENDENT_AMBULATORY_CARE_PROVIDER_SITE_OTHER): Payer: No Typology Code available for payment source

## 2021-10-19 ENCOUNTER — Other Ambulatory Visit: Payer: Self-pay

## 2021-10-19 VITALS — BP 120/82 | HR 87 | Temp 97.6°F | Ht 74.0 in | Wt 287.5 lb

## 2021-10-19 DIAGNOSIS — E0500 Thyrotoxicosis with diffuse goiter without thyrotoxic crisis or storm: Secondary | ICD-10-CM

## 2021-10-19 DIAGNOSIS — K219 Gastro-esophageal reflux disease without esophagitis: Secondary | ICD-10-CM | POA: Diagnosis not present

## 2021-10-19 DIAGNOSIS — R0602 Shortness of breath: Secondary | ICD-10-CM | POA: Diagnosis not present

## 2021-10-19 DIAGNOSIS — G629 Polyneuropathy, unspecified: Secondary | ICD-10-CM | POA: Diagnosis not present

## 2021-10-19 DIAGNOSIS — E538 Deficiency of other specified B group vitamins: Secondary | ICD-10-CM | POA: Insufficient documentation

## 2021-10-19 DIAGNOSIS — I10 Essential (primary) hypertension: Secondary | ICD-10-CM

## 2021-10-19 DIAGNOSIS — E785 Hyperlipidemia, unspecified: Secondary | ICD-10-CM

## 2021-10-19 DIAGNOSIS — I4891 Unspecified atrial fibrillation: Secondary | ICD-10-CM

## 2021-10-19 DIAGNOSIS — E1142 Type 2 diabetes mellitus with diabetic polyneuropathy: Secondary | ICD-10-CM

## 2021-10-19 MED ORDER — EMPAGLIFLOZIN 10 MG PO TABS
10.0000 mg | ORAL_TABLET | Freq: Every day | ORAL | 1 refills | Status: DC
  Filled 2021-10-19: qty 90, 90d supply, fill #0
  Filled 2021-11-19: qty 90, 90d supply, fill #1

## 2021-10-19 MED ORDER — PREGABALIN 200 MG PO CAPS
ORAL_CAPSULE | ORAL | 1 refills | Status: DC
  Filled 2021-10-19: qty 180, fill #0
  Filled 2021-10-30: qty 180, 90d supply, fill #0

## 2021-10-19 MED ORDER — FUROSEMIDE 40 MG PO TABS
80.0000 mg | ORAL_TABLET | Freq: Every day | ORAL | 1 refills | Status: DC
  Filled 2021-10-19: qty 180, 90d supply, fill #0

## 2021-10-19 MED ORDER — PANTOPRAZOLE SODIUM 40 MG PO TBEC
DELAYED_RELEASE_TABLET | ORAL | 1 refills | Status: DC
  Filled 2021-10-19: qty 90, 90d supply, fill #0

## 2021-10-19 MED ORDER — TRIAMCINOLONE ACETONIDE 0.1 % EX CREA
TOPICAL_CREAM | CUTANEOUS | 2 refills | Status: DC
  Filled 2021-10-19: qty 45, 14d supply, fill #0

## 2021-10-19 MED ORDER — ATENOLOL 100 MG PO TABS
100.0000 mg | ORAL_TABLET | Freq: Every day | ORAL | 1 refills | Status: DC
  Filled 2021-10-19: qty 90, 90d supply, fill #0

## 2021-10-19 MED ORDER — METHIMAZOLE 5 MG PO TABS
5.0000 mg | ORAL_TABLET | Freq: Every morning | ORAL | 1 refills | Status: DC
  Filled 2021-10-19: qty 90, 90d supply, fill #0

## 2021-10-19 MED ORDER — EMPAGLIFLOZIN 10 MG PO TABS: 10.0000 mg | ORAL_TABLET | Freq: Every day | ORAL | 0 refills | Status: DC

## 2021-10-19 MED ORDER — ATORVASTATIN CALCIUM 20 MG PO TABS
ORAL_TABLET | Freq: Every day | ORAL | 1 refills | Status: DC
  Filled 2021-10-19: qty 90, 90d supply, fill #0

## 2021-10-19 MED ORDER — SILDENAFIL CITRATE 100 MG PO TABS
50.0000 mg | ORAL_TABLET | Freq: Every day | ORAL | 11 refills | Status: DC | PRN
  Filled 2021-10-19: qty 18, 69d supply, fill #0
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
## 2021-10-19 NOTE — Patient Instructions (Addendum)
Call Dr. Kyla Balzarine office. Let them know you have a new diagnosis of atrial fibrillation and that you need to have a visit with him.     Ok to restart baby aspirin daily until you see Dr. Johnsie Cancel.

## 2021-10-20 ENCOUNTER — Other Ambulatory Visit (HOSPITAL_COMMUNITY): Payer: Self-pay

## 2021-10-20 ENCOUNTER — Encounter: Payer: Self-pay | Admitting: Family Medicine

## 2021-10-20 DIAGNOSIS — I4891 Unspecified atrial fibrillation: Secondary | ICD-10-CM

## 2021-10-20 NOTE — Telephone Encounter (Signed)
So we only have a few samples left of the 20 mg tablets. I do have a one time use 30 day free trial coupon that he can use at the pharmacy though if you want that. I can leave it on your desk and just let me know tomorrow if you want to keep it or if you want to just use the 3 bottles of samples we have left.

## 2021-10-21 ENCOUNTER — Other Ambulatory Visit (HOSPITAL_COMMUNITY): Payer: Self-pay

## 2021-10-21 ENCOUNTER — Telehealth: Payer: Self-pay | Admitting: Pharmacist

## 2021-10-21 ENCOUNTER — Other Ambulatory Visit: Payer: Self-pay | Admitting: Family Medicine

## 2021-10-21 MED ORDER — RIVAROXABAN 20 MG PO TABS
20.0000 mg | ORAL_TABLET | Freq: Every day | ORAL | 0 refills | Status: DC
  Filled 2021-10-21: qty 30, 30d supply, fill #0

## 2021-10-21 MED ORDER — RIVAROXABAN 20 MG PO TABS: 20.0000 mg | ORAL_TABLET | Freq: Every day | ORAL | 0 refills | Status: DC

## 2021-10-21 NOTE — Telephone Encounter (Signed)
Called patient to make him aware that a prescription was sent in for him to start on the Xarelto. Patient is aware to take this once daily with his evening meal. Patient verbalized his understanding and will pick it up from the pharmacy tomorrow. Patient is aware to stop the aspirin as well.  Called the pharmacy to provide 1 time free trial coupon for first fill of Xarelto. Pharmacy confirmed a $0 copay.

## 2021-10-21 NOTE — Telephone Encounter (Signed)
We can do the 30 day sample! I'll print the rx for him.

## 2021-10-22 ENCOUNTER — Other Ambulatory Visit (HOSPITAL_COMMUNITY): Payer: Self-pay

## 2021-10-23 ENCOUNTER — Encounter: Payer: Self-pay | Admitting: Neurology

## 2021-10-23 ENCOUNTER — Other Ambulatory Visit (HOSPITAL_COMMUNITY): Payer: Self-pay

## 2021-10-23 ENCOUNTER — Ambulatory Visit (INDEPENDENT_AMBULATORY_CARE_PROVIDER_SITE_OTHER): Payer: No Typology Code available for payment source | Admitting: Neurology

## 2021-10-23 VITALS — BP 105/69 | HR 72 | Ht 74.0 in | Wt 288.0 lb

## 2021-10-23 DIAGNOSIS — R0602 Shortness of breath: Secondary | ICD-10-CM

## 2021-10-23 DIAGNOSIS — I480 Paroxysmal atrial fibrillation: Secondary | ICD-10-CM | POA: Diagnosis not present

## 2021-10-23 DIAGNOSIS — M50022 Cervical disc disorder at C5-C6 level with myelopathy: Secondary | ICD-10-CM | POA: Diagnosis not present

## 2021-10-23 DIAGNOSIS — R0681 Apnea, not elsewhere classified: Secondary | ICD-10-CM

## 2021-10-23 DIAGNOSIS — R0683 Snoring: Secondary | ICD-10-CM
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
## 2021-10-26 ENCOUNTER — Other Ambulatory Visit (HOSPITAL_COMMUNITY): Payer: Self-pay

## 2021-10-26 ENCOUNTER — Encounter: Payer: Self-pay | Admitting: Family Medicine

## 2021-10-29 ENCOUNTER — Other Ambulatory Visit (HOSPITAL_COMMUNITY): Payer: Self-pay

## 2021-10-30 ENCOUNTER — Other Ambulatory Visit (HOSPITAL_COMMUNITY): Payer: Self-pay

## 2021-11-03 ENCOUNTER — Other Ambulatory Visit (HOSPITAL_COMMUNITY): Payer: Self-pay

## 2021-11-05 ENCOUNTER — Other Ambulatory Visit (HOSPITAL_COMMUNITY): Payer: Self-pay

## 2021-11-05 ENCOUNTER — Ambulatory Visit (INDEPENDENT_AMBULATORY_CARE_PROVIDER_SITE_OTHER): Payer: No Typology Code available for payment source

## 2021-11-05 ENCOUNTER — Other Ambulatory Visit: Payer: Self-pay

## 2021-11-05 DIAGNOSIS — I4891 Unspecified atrial fibrillation: Secondary | ICD-10-CM

## 2021-11-05 LAB — ECHOCARDIOGRAM COMPLETE
Area-P 1/2: 5.02 cm2
S' Lateral: 2.9 cm

## 2021-11-06 ENCOUNTER — Other Ambulatory Visit (HOSPITAL_COMMUNITY): Payer: Self-pay

## 2021-11-16 ENCOUNTER — Other Ambulatory Visit: Payer: Self-pay | Admitting: Family Medicine

## 2021-11-16 ENCOUNTER — Other Ambulatory Visit (HOSPITAL_COMMUNITY): Payer: Self-pay

## 2021-11-16 MED ORDER — RIVAROXABAN 20 MG PO TABS
20.0000 mg | ORAL_TABLET | Freq: Every day | ORAL | 1 refills | Status: DC
  Filled 2021-11-16: qty 30, 30d supply, fill #0
  Filled 2021-12-29: qty 30, 30d supply, fill #1

## 2021-11-17 ENCOUNTER — Other Ambulatory Visit (HOSPITAL_COMMUNITY): Payer: Self-pay

## 2021-11-18 ENCOUNTER — Ambulatory Visit (INDEPENDENT_AMBULATORY_CARE_PROVIDER_SITE_OTHER): Payer: PPO | Admitting: Neurology

## 2021-11-18 DIAGNOSIS — R0683 Snoring: Secondary | ICD-10-CM

## 2021-11-18 DIAGNOSIS — I480 Paroxysmal atrial fibrillation: Secondary | ICD-10-CM

## 2021-11-18 DIAGNOSIS — M50022 Cervical disc disorder at C5-C6 level with myelopathy: Secondary | ICD-10-CM

## 2021-11-18 DIAGNOSIS — G4734 Idiopathic sleep related nonobstructive alveolar hypoventilation: Secondary | ICD-10-CM

## 2021-11-18 DIAGNOSIS — G4733 Obstructive sleep apnea (adult) (pediatric): Secondary | ICD-10-CM | POA: Diagnosis not present

## 2021-11-18 DIAGNOSIS — R0602 Shortness of breath: Secondary | ICD-10-CM

## 2021-11-18 DIAGNOSIS — G4730 Sleep apnea, unspecified: Secondary | ICD-10-CM

## 2021-11-18 DIAGNOSIS — R0681 Apnea, not elsewhere classified: Secondary | ICD-10-CM

## 2021-11-19 ENCOUNTER — Encounter: Payer: Self-pay | Admitting: Family Medicine

## 2021-11-19 ENCOUNTER — Other Ambulatory Visit (HOSPITAL_COMMUNITY): Payer: Self-pay
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
## 2021-11-20 ENCOUNTER — Other Ambulatory Visit: Payer: Self-pay

## 2021-11-20 ENCOUNTER — Encounter (HOSPITAL_BASED_OUTPATIENT_CLINIC_OR_DEPARTMENT_OTHER): Payer: Self-pay | Admitting: Family

## 2021-11-20 ENCOUNTER — Ambulatory Visit (HOSPITAL_BASED_OUTPATIENT_CLINIC_OR_DEPARTMENT_OTHER): Payer: PPO | Admitting: Family

## 2021-11-20 ENCOUNTER — Telehealth (HOSPITAL_BASED_OUTPATIENT_CLINIC_OR_DEPARTMENT_OTHER): Payer: Self-pay

## 2021-11-20 VITALS — BP 124/80 | HR 86 | Ht 74.0 in | Wt 282.0 lb

## 2021-11-20 DIAGNOSIS — I10 Essential (primary) hypertension: Secondary | ICD-10-CM

## 2021-11-20 DIAGNOSIS — R600 Localized edema: Secondary | ICD-10-CM | POA: Diagnosis not present

## 2021-11-20 DIAGNOSIS — I480 Paroxysmal atrial fibrillation: Secondary | ICD-10-CM

## 2021-11-20 DIAGNOSIS — D6859 Other primary thrombophilia: Secondary | ICD-10-CM

## 2021-11-20 DIAGNOSIS — E0590 Thyrotoxicosis, unspecified without thyrotoxic crisis or storm: Secondary | ICD-10-CM

## 2021-11-20 DIAGNOSIS — E782 Mixed hyperlipidemia: Secondary | ICD-10-CM | POA: Diagnosis not present

## 2021-11-20 NOTE — Addendum Note (Signed)
Addended by: Gerald Stabs on: 11/20/2021 01:48 PM   Modules accepted: Orders

## 2021-11-20 NOTE — Patient Instructions (Signed)
Medication Instructions:  Your Physician recommend you continue on your current medication as directed.     *If you need a refill on your cardiac medications before your next appointment, please call your pharmacy*   Lab Work: Your physician recommends that you return for lab work on January 19th, 2023! Please return for Lab work. You may come to the...   [facility] (3rd floor) [address], [place], [place] [address]  Open: 8am-Noon and 1pm-4:30pm   [place] [place]- Any location  **no appointments needed**   If you have labs (blood work) drawn today and your tests are completely normal, you will receive your results only by: [employer] (if you have MyChart) OR A paper copy in the mail If you have any lab test that is abnormal or we need to change your treatment, we will call you to review the results.   Testing/Procedures: Your EKG today showed Atrial Fibrillation.   You are scheduled for a Cardioversion on January 26th with Dr. Stanford Breed.  Please arrive at the [facility] (Main Entrance A) at [facility]: [address] [place], [place] [address] at 9:30 am. (1 hour prior to procedure unless lab work is needed; if lab work is needed arrive 1.5 hours ahead)  DIET: Nothing to eat or drink after midnight except a sip of water with medications (see medication instructions below)  FYI: For your safety, and to allow [place] to monitor your vital signs accurately during the surgery/procedure we request that   if you have artificial nails, gel coating, SNS etc. Please have those removed prior to your surgery/procedure. Not having the nail coverings /polish removed may result in cancellation or delay of your surgery/procedure.   Medication Instructions: Hold lasix the morning of your test  Continue your anticoagulant: Xarelto  You will need to continue your anticoagulant after your procedure  until you  are told by your Provider that it is safe to stop  You must have a responsible person to drive you home and stay in the waiting area during your procedure. Failure to do so could result in cancellation.  Bring your insurance cards.  *Special Note: Every effort is made to have your procedure done on time. Occasionally there are emergencies that occur at the hospital that may cause delays. Please be patient if a delay does occur.     Follow-Up: At [facility], you and your health needs are our priority.  As part of our continuing mission to provide you with exceptional heart care, we have created designated Provider Care Teams.  These Care Teams include your primary Cardiologist (physician) and Advanced Practice Providers (APPs -  Physician Assistants and Nurse Practitioners) who all work together to provide you with the care you need, when you need it.  We recommend signing up for the patient portal called "MyChart".  Sign up information is provided on this After Visit Summary.  MyChart is used to connect with patients for Virtual Visits (Telemedicine).  Patients are able to view lab/test results, encounter notes, upcoming appointments, etc.  Non-urgent messages can be sent to your provider as well.   To learn more about what you can do with MyChart, go to [email or web address].    Your next appointment:   February 9th at 09:15am with Laurann Montana   The format for your next appointment:   In Person  Provider:   Laurann Montana, NP    Other Instructions  Atrial Fibrillation Atrial fibrillation is a type of heartbeat that is irregular or fast. If you have this condition, your heart beats without any order. This makes it hard for your heart to pump blood in a normal way. Atrial fibrillation may come and go, or it may become a long-lasting problem. If this condition is not treated, it can put you at higher risk for stroke, heart failure, and other heart problems. What are  the causes? This condition may be caused by diseases that damage the heart. They include: High blood pressure. Heart failure. Heart valve disease. Heart surgery. Other causes include: Diabetes. Thyroid disease. Being overweight. Kidney disease. Sometimes the cause is not known. What increases the risk? You are more likely to develop this condition if: You are older. You smoke. You exercise often and very hard. You have a family history of this condition. You are a man. You use drugs. You drink a lot of alcohol. You have lung conditions, such as emphysema, pneumonia, or COPD. You have sleep apnea. What are the signs or symptoms? Common symptoms of this condition include: A feeling that your heart is beating very fast. Chest pain or discomfort. Feeling short of breath. Suddenly feeling light-headed or weak. Getting tired easily during activity. Fainting. Sweating. In some cases, there are no symptoms. How is this treated? Treatment for this condition depends on underlying conditions and how you feel when you have atrial fibrillation. They include: Medicines to: Prevent blood clots. Treat heart rate or heart rhythm problems. Using devices, such as a pacemaker, to correct heart rhythm problems. Doing surgery to remove the part of the heart that sends bad signals. Closing an area where clots can form in the heart (left atrial appendage). In some cases, your doctor will treat other underlying conditions. Follow these instructions at home: Medicines Take over-the-counter and prescription medicines only as told by your doctor. Do not take any new medicines without first talking to your doctor. If you are taking blood thinners: Talk with your doctor before you take any medicines that have aspirin or NSAIDs, such as ibuprofen, in them. Take your medicine exactly as told by your doctor. Take it at the same time each day. Avoid activities that could hurt or bruise you. Follow  instructions about how to prevent falls. Wear a bracelet that says you are taking blood thinners. Or, carry a card that lists what medicines you take. Lifestyle   Do not use any products that have nicotine or tobacco in them. These include cigarettes, e-cigarettes, and chewing tobacco. If you need help quitting, ask your doctor. Eat heart-healthy foods. Talk with your doctor about the right eating plan for you. Exercise regularly as told by your doctor. Do not drink alcohol. Lose weight if you are overweight. Do not use drugs, including cannabis. General instructions If you have a condition that causes breathing to stop for a short period of time (apnea), treat it as told by your doctor. Keep a healthy weight. Do not use diet pills unless your doctor says they are safe for you. Diet pills may make heart problems worse. Keep all follow-up visits as told by your doctor. This is important. Contact a doctor if: You notice a change in the speed, rhythm, or strength of your heartbeat. You are taking a blood-thinning medicine and you get more bruising. You get tired more easily when you move or exercise. You have a sudden change in weight. Get help right away if:  You have pain in your chest  or your belly (abdomen). You have trouble breathing. You have side effects of blood thinners, such as blood in your vomit, poop (stool), or pee (urine), or bleeding that cannot stop. You have any signs of a stroke. "BE FAST" is an easy way to remember the main warning signs: B - Balance. Signs are dizziness, sudden trouble walking, or loss of balance. E - Eyes. Signs are trouble seeing or a change in how you see. F - Face. Signs are sudden weakness or loss of feeling in the face, or the face or eyelid drooping on one side. A - Arms. Signs are weakness or loss of feeling in an arm. This happens suddenly and usually on one side of the body. S - Speech. Signs are sudden trouble speaking, slurred speech, or  trouble understanding what people say. T - Time. Time to call emergency services. Write down what time symptoms started. You have other signs of a stroke, such as: A sudden, very bad headache with no known cause. Feeling like you may vomit (nausea). Vomiting. A seizure. These symptoms may be an emergency. Do not wait to see if the symptoms will go away. Get medical help right away. Call your local emergency services (911 in the [place] Do not drive yourself to the hospital. Summary Atrial fibrillation is a type of heartbeat that is irregular or fast. You are at higher risk of this condition if you smoke, are older, have diabetes, or are overweight. Follow your doctor's instructions about medicines, diet, exercise, and follow-up visits. Get help right away if you have signs or symptoms of a stroke. Get help right away if you cannot catch your breath, or you have chest pain or discomfort. This information is not intended to replace advice given to you by your health care provider. Make sure you discuss any questions you have with your health care provider. Document Revised: 04/18/2019 Document Reviewed: 04/18/2019 Elsevier Patient Education  [place].

## 2021-11-20 NOTE — Telephone Encounter (Signed)
° °  Pre-operative Risk Assessment    Patient Name: Jeffrey Price  DOB: [birth year] MRN: [id number]      Request for Surgical Clearance    Procedure:   Bilateral L4 Nerve Block  Date of Surgery:  Clearance TBD                                 Surgeon:  Normajean Glasgow, MD Surgeon's Group or Practice Name:  [facility]  Phone number:  [phone] Fax number:  [phone]   Type of Clearance Requested:   - Medical  - Pharmacy:  Hold Rivaroxaban (Xarelto)     Type of Anesthesia:  Not Indicated   Additional requests/questions:  Please advise surgeon/provider what medications should be held. Please fax a copy of clearance to the surgeon's office.  Signed, Jeffrey Price   11/20/2021, 3:52 PM

## 2021-11-23 NOTE — Progress Notes (Signed)
[facility] TEST REPORT ( by Watch PAT)   STUDY DATA:  11-23-2021    ORDERING CLINICIAN: Larey Seat, MD  REFERRING CLINICIAN: Dr Tana Felts, MD  Consultation on 10/23/2021 from PCP Tana Felts, MD,  for an apnea evaluation.    CLINICAL INFORMATION/HISTORY: 66 year -old male patient with new atrial fib onset:   Reports his girlfriend has witnessed snoring and apnoeic events while he is sleeping. He has severe hearing loss, talks loudly.  Girl Friend has recorded his apnoeic breathing.  ONO by PCP was abnormal, EKG showed atrail fib.  SOB was severe.  He had a surgery under general anesthesia and had trouble to be extubated, to breathe on his own.       Epworth sleepiness score: 16/24.   BMI: 37.1kg/m   Neck Circumference: 20"   FINDINGS:   Sleep Summary:   Total Recording Time (hours, min): Total recording time amounted to 9 hours and 38 minutes of which 9 hours and 7 minutes was a total recorded sleep time.  18% of total sleep time were REM sleep.                                      Respiratory Indices:   Calculated pAHI (per hour):   Apnea-hypopnea index was severe: overall 74.3/h, during REM sleep 83/h a and during non-REM sleep 73.3/h.                                             Positional AHI: In supine sleep the AHI exacerbated to 81.1/h in prone sleep 40.9/h on the left side 75.6/h.  Apnea was severe in all sleep positions.  Snoring level was far above average with a mean volume of 47 dB.  Snoring accompanied 65% of the total recorded sleep time.  About an hour of total sleep time was accompanied by snoring over 60 dB loud.                                                  Oxygen Saturation Statistics:      O2 Saturation Range (%): Nadir was captured at 60% saturation was a maximum at 95% saturation with a mean saturation of 83%.                                      O2 Saturation (minutes) <89%:    328 minutes the equivalent  of 60% of total sleep time.  Saturation of oxygen under 90% was present for 355 minutes the equivalent of 65% of total sleep time.       Pulse Rate Statistics:   Pulse Mean (bpm):      85 bpm           Pulse Range: Highly variable with a minimum of 38 bpm and a maximum 130 bpm.               IMPRESSION:  This HST confirms the presence of very severe apnea which is slightly accentuated in  rem sleep and therefore most likely dominantly obstructive in origin.  However 24.6% of apneas were central or Cheyne-Stokes respiration related.  This is a very complex form of severe apnea associated with severe rest hypoxia and highly variable heart rate.  Most likely the patient was in atrial fibrillation throughout this night.  Positive airway pressure therapy should start immediately.   RECOMMENDATION: Auto CPAP with a setting from 8 through 20 cmH2O with 3 cm EPR, mask of choice and heated humidification.  This patient likely needs additional oxygen.  If the patient is established with a pulmonology specialist I would like him to continue his oxygen need work-up there. For our sleep lab to be able to provide oxygen and prescribe how many liters the patient should be given we would need to titrate him to oxygen while on CPAP. This requires an in- lab titration study.    INTERPRETING PHYSICIAN:   Larey Seat, MD   Medical Director of [facility] at [facility].

## 2021-11-23 NOTE — Telephone Encounter (Signed)
Patient with diagnosis of afib on Xarelto for anticoagulation.    Procedure: Bilateral L4 Nerve Block Date of procedure: TBD  CHA2DS2-VASc Score = 4   This indicates a 4.8% annual risk of stroke. The patient's score is based upon: CHF History: 0 HTN History: 1 Diabetes History: 1 Stroke History: 0 Vascular Disease History: 1 Age Score: 1 Gender Score: 0      CrCl 102.6 ml/min  Per office protocol, patient can hold Xarelto for 3 days prior to procedure.

## 2021-11-24 ENCOUNTER — Encounter (HOSPITAL_COMMUNITY): Payer: Self-pay | Admitting: Cardiology

## 2021-11-24 DIAGNOSIS — L820 Inflamed seborrheic keratosis: Secondary | ICD-10-CM | POA: Diagnosis not present

## 2021-11-24 DIAGNOSIS — Z411 Encounter for cosmetic surgery: Secondary | ICD-10-CM | POA: Diagnosis not present

## 2021-11-24 NOTE — Telephone Encounter (Signed)
° °  Primary Cardiologist: Jenkins Rouge, MD  Chart reviewed as part of pre-operative protocol coverage. Given past medical history and time since last visit, based on ACC/AHA guidelines, Jeffrey Price would be at acceptable risk for the planned procedure without further cardiovascular testing.   Patient with diagnosis of afib on Xarelto for anticoagulation.     Procedure: Bilateral L4 Nerve Block Date of procedure: TBD   CHA2DS2-VASc Score = 4   This indicates a 4.8% annual risk of stroke. The patient's score is based upon: CHF History: 0 HTN History: 1 Diabetes History: 1 Stroke History: 0 Vascular Disease History: 1 Age Score: 1 Gender Score: 0     CrCl 102.6 ml/min   Per office protocol, patient can hold Xarelto for 3 days prior to procedure.  Patient is scheduled for cardioversion on 12/03/2021.  If patient has not received nerve block prior to cardioversion he will need to wait at least 4 weeks post DCCV before having nerve block.  I will route this recommendation to the requesting party via Epic fax function and remove from pre-op pool.  Please call with questions.  [name] NP-C    11/24/2021, 8:39 AM [place] [address] [place] Office [phone] Fax [phone]

## 2021-11-26 DIAGNOSIS — I480 Paroxysmal atrial fibrillation: Secondary | ICD-10-CM | POA: Diagnosis not present

## 2021-11-26 DIAGNOSIS — D6859 Other primary thrombophilia: Secondary | ICD-10-CM | POA: Diagnosis not present

## 2021-11-27 LAB — CBC
Hematocrit: 56.8 % — ABNORMAL HIGH (ref 37.5–51.0)
Hemoglobin: 18.3 g/dL — ABNORMAL HIGH (ref 13.0–17.7)
MCH: 28.3 pg (ref 26.6–33.0)
MCHC: 32.2 g/dL (ref 31.5–35.7)
MCV: 88 fL (ref 79–97)
Platelets: 290 10*3/uL (ref 150–450)
RBC: 6.46 x10E6/uL — ABNORMAL HIGH (ref 4.14–5.80)
RDW: 14.3 % (ref 11.6–15.4)
WBC: 10.4 10*3/uL (ref 3.4–10.8)

## 2021-11-27 LAB — BASIC METABOLIC PANEL
BUN/Creatinine Ratio: 13 (ref 10–24)
BUN: 15 mg/dL (ref 8–27)
CO2: 20 mmol/L (ref 20–29)
Calcium: 9.6 mg/dL (ref 8.6–10.2)
Chloride: 96 mmol/L (ref 96–106)
Creatinine, Ser: 1.19 mg/dL (ref 0.76–1.27)
Glucose: 129 mg/dL — ABNORMAL HIGH (ref 70–99)
Potassium: 4.7 mmol/L (ref 3.5–5.2)
Sodium: 140 mmol/L (ref 134–144)
eGFR: 68 mL/min/{1.73_m2} (ref >59)

## 2021-11-29 ENCOUNTER — Encounter: Payer: Self-pay | Admitting: Family Medicine

## 2021-11-30 ENCOUNTER — Telehealth: Payer: Self-pay | Admitting: Family Medicine

## 2021-11-30 ENCOUNTER — Encounter (HOSPITAL_BASED_OUTPATIENT_CLINIC_OR_DEPARTMENT_OTHER): Payer: Self-pay | Admitting: Emergency Medicine

## 2021-11-30 ENCOUNTER — Emergency Department (HOSPITAL_BASED_OUTPATIENT_CLINIC_OR_DEPARTMENT_OTHER): Payer: Worker's Compensation

## 2021-11-30 ENCOUNTER — Other Ambulatory Visit: Payer: Self-pay

## 2021-11-30 ENCOUNTER — Ambulatory Visit: Payer: PPO | Admitting: Family Medicine

## 2021-11-30 ENCOUNTER — Emergency Department (HOSPITAL_BASED_OUTPATIENT_CLINIC_OR_DEPARTMENT_OTHER): Payer: Worker's Compensation | Admitting: Radiology

## 2021-11-30 ENCOUNTER — Observation Stay (HOSPITAL_BASED_OUTPATIENT_CLINIC_OR_DEPARTMENT_OTHER)
Admission: EM | Admit: 2021-11-30 | Discharge: 2021-12-02 | Disposition: A | Discharge status: Home / Self Care | Payer: Worker's Compensation | Attending: Internal Medicine | Admitting: Internal Medicine

## 2021-11-30 DIAGNOSIS — M71129 Other infective bursitis, unspecified elbow: Secondary | ICD-10-CM | POA: Diagnosis present

## 2021-11-30 DIAGNOSIS — I2510 Atherosclerotic heart disease of native coronary artery without angina pectoris: Secondary | ICD-10-CM | POA: Diagnosis not present

## 2021-11-30 DIAGNOSIS — Z794 Long term (current) use of insulin: Secondary | ICD-10-CM | POA: Insufficient documentation

## 2021-11-30 DIAGNOSIS — E119 Type 2 diabetes mellitus without complications: Secondary | ICD-10-CM | POA: Insufficient documentation

## 2021-11-30 DIAGNOSIS — M71122 Other infective bursitis, left elbow: Secondary | ICD-10-CM | POA: Diagnosis not present

## 2021-11-30 DIAGNOSIS — Z7901 Long term (current) use of anticoagulants: Secondary | ICD-10-CM | POA: Diagnosis not present

## 2021-11-30 DIAGNOSIS — E785 Hyperlipidemia, unspecified: Secondary | ICD-10-CM | POA: Diagnosis not present

## 2021-11-30 DIAGNOSIS — L0390 Cellulitis, unspecified: Secondary | ICD-10-CM

## 2021-11-30 DIAGNOSIS — E1140 Type 2 diabetes mellitus with diabetic neuropathy, unspecified: Secondary | ICD-10-CM | POA: Diagnosis not present

## 2021-11-30 DIAGNOSIS — Z79899 Other long term (current) drug therapy: Secondary | ICD-10-CM | POA: Diagnosis not present

## 2021-11-30 DIAGNOSIS — M7989 Other specified soft tissue disorders: Secondary | ICD-10-CM | POA: Diagnosis not present

## 2021-11-30 DIAGNOSIS — B9561 Methicillin susceptible Staphylococcus aureus infection as the cause of diseases classified elsewhere: Secondary | ICD-10-CM | POA: Diagnosis not present

## 2021-11-30 DIAGNOSIS — Z87891 Personal history of nicotine dependence: Secondary | ICD-10-CM | POA: Insufficient documentation

## 2021-11-30 DIAGNOSIS — L03114 Cellulitis of left upper limb: Secondary | ICD-10-CM | POA: Diagnosis not present

## 2021-11-30 DIAGNOSIS — I10 Essential (primary) hypertension: Secondary | ICD-10-CM | POA: Diagnosis not present

## 2021-11-30 DIAGNOSIS — E1149 Type 2 diabetes mellitus with other diabetic neurological complication: Secondary | ICD-10-CM | POA: Diagnosis present

## 2021-11-30 DIAGNOSIS — J439 Emphysema, unspecified: Secondary | ICD-10-CM | POA: Diagnosis present

## 2021-11-30 DIAGNOSIS — G4733 Obstructive sleep apnea (adult) (pediatric): Secondary | ICD-10-CM | POA: Insufficient documentation

## 2021-11-30 DIAGNOSIS — I712 Thoracic aortic aneurysm, without rupture, unspecified: Secondary | ICD-10-CM | POA: Diagnosis present

## 2021-11-30 DIAGNOSIS — J9811 Atelectasis: Secondary | ICD-10-CM | POA: Diagnosis not present

## 2021-11-30 DIAGNOSIS — Z20822 Contact with and (suspected) exposure to covid-19: Secondary | ICD-10-CM | POA: Diagnosis not present

## 2021-11-30 DIAGNOSIS — E0500 Thyrotoxicosis with diffuse goiter without thyrotoxic crisis or storm: Secondary | ICD-10-CM | POA: Diagnosis present

## 2021-11-30 DIAGNOSIS — J449 Chronic obstructive pulmonary disease, unspecified: Secondary | ICD-10-CM | POA: Diagnosis present

## 2021-11-30 DIAGNOSIS — G4734 Idiopathic sleep related nonobstructive alveolar hypoventilation: Secondary | ICD-10-CM | POA: Diagnosis present

## 2021-11-30 DIAGNOSIS — I480 Paroxysmal atrial fibrillation: Secondary | ICD-10-CM | POA: Diagnosis present

## 2021-11-30 DIAGNOSIS — M79622 Pain in left upper arm: Secondary | ICD-10-CM | POA: Diagnosis present

## 2021-11-30 DIAGNOSIS — M7022 Olecranon bursitis, left elbow: Secondary | ICD-10-CM | POA: Diagnosis not present

## 2021-11-30 LAB — SYNOVIAL CELL COUNT + DIFF, W/ CRYSTALS
Crystals, Fluid: NONE SEEN
Eosinophils-Synovial: 0 % (ref 0–1)
Lymphocytes-Synovial Fld: 3 % (ref 0–20)
Monocyte-Macrophage-Synovial Fluid: 1 % — ABNORMAL LOW (ref 50–90)
Neutrophil, Synovial: 96 % — ABNORMAL HIGH (ref 0–25)
WBC, Synovial: 7600 /mm3 — ABNORMAL HIGH (ref 0–200)

## 2021-11-30 LAB — URINALYSIS, ROUTINE W REFLEX MICROSCOPIC
Bilirubin Urine: NEGATIVE
Glucose, UA: >1000 mg/dL — AB
Hgb urine dipstick: NEGATIVE
Ketones, ur: NEGATIVE mg/dL
Leukocytes,Ua: NEGATIVE
Nitrite: NEGATIVE
Protein, ur: NEGATIVE mg/dL
Specific Gravity, Urine: 1.007 (ref 1.005–1.030)
pH: 6.5 (ref 5.0–8.0)

## 2021-11-30 LAB — CBC WITH DIFFERENTIAL/PLATELET
Abs Immature Granulocytes: 0.06 10*3/uL (ref 0.00–0.07)
Basophils Absolute: 0.1 10*3/uL (ref 0.0–0.1)
Basophils Relative: 1 %
Eosinophils Absolute: 0.2 10*3/uL (ref 0.0–0.5)
Eosinophils Relative: 2 %
HCT: 51.8 % (ref 39.0–52.0)
Hemoglobin: 16.8 g/dL (ref 13.0–17.0)
Immature Granulocytes: 1 %
Lymphocytes Relative: 18 %
Lymphs Abs: 1.8 10*3/uL (ref 0.7–4.0)
MCH: 28.1 pg (ref 26.0–34.0)
MCHC: 32.4 g/dL (ref 30.0–36.0)
MCV: 86.6 fL (ref 80.0–100.0)
Monocytes Absolute: 1.2 10*3/uL — ABNORMAL HIGH (ref 0.1–1.0)
Monocytes Relative: 12 %
Neutro Abs: 6.6 10*3/uL (ref 1.7–7.7)
Neutrophils Relative %: 66 %
Platelets: 273 10*3/uL (ref 150–400)
RBC: 5.98 MIL/uL — ABNORMAL HIGH (ref 4.22–5.81)
RDW: 14.6 % (ref 11.5–15.5)
WBC: 9.9 10*3/uL (ref 4.0–10.5)
nRBC: 0 % (ref 0.0–0.2)

## 2021-11-30 LAB — URIC ACID: Uric Acid, Serum: 6.7 mg/dL (ref 3.7–8.6)

## 2021-11-30 LAB — COMPREHENSIVE METABOLIC PANEL
ALT: 13 U/L (ref 0–44)
AST: 19 U/L (ref 15–41)
Albumin: 4 g/dL (ref 3.5–5.0)
Alkaline Phosphatase: 75 U/L (ref 38–126)
Anion gap: 10 (ref 5–15)
BUN: 14 mg/dL (ref 8–23)
CO2: 25 mmol/L (ref 22–32)
Calcium: 9 mg/dL (ref 8.9–10.3)
Chloride: 99 mmol/L (ref 98–111)
Creatinine, Ser: 1.02 mg/dL (ref 0.61–1.24)
GFR, Estimated: >60 mL/min (ref >60)
Glucose, Bld: 107 mg/dL — ABNORMAL HIGH (ref 70–99)
Potassium: 3.9 mmol/L (ref 3.5–5.1)
Sodium: 134 mmol/L — ABNORMAL LOW (ref 135–145)
Total Bilirubin: 1 mg/dL (ref 0.3–1.2)
Total Protein: 7.4 g/dL (ref 6.5–8.1)

## 2021-11-30 LAB — RESP PANEL BY RT-PCR (FLU A&B, COVID) ARPGX2
Influenza A by PCR: NEGATIVE
Influenza B by PCR: NEGATIVE
SARS Coronavirus 2 by RT PCR: NEGATIVE

## 2021-11-30 LAB — LACTIC ACID, PLASMA: Lactic Acid, Venous: 1.1 mmol/L (ref 0.5–1.9)

## 2021-11-30 LAB — C-REACTIVE PROTEIN: CRP: 11.3 mg/dL — ABNORMAL HIGH (ref <1.0)

## 2021-11-30 LAB — SEDIMENTATION RATE: Sed Rate: 18 mm/hr — ABNORMAL HIGH (ref 0–16)

## 2021-11-30 LAB — CBG MONITORING, ED: Glucose-Capillary: 159 mg/dL — ABNORMAL HIGH (ref 70–99)

## 2021-11-30 MED ORDER — MELOXICAM 7.5 MG PO TABS
15.0000 mg | ORAL_TABLET | Freq: Once | ORAL | Status: AC
  Administered 2021-11-30: 15 mg via ORAL
  Filled 2021-11-30: qty 2

## 2021-11-30 MED ORDER — PREGABALIN 75 MG PO CAPS
200.0000 mg | ORAL_CAPSULE | Freq: Once | ORAL | Status: AC
  Administered 2021-11-30: 200 mg via ORAL
  Filled 2021-11-30: qty 2

## 2021-11-30 MED ORDER — VANCOMYCIN HCL IN DEXTROSE 1-5 GM/200ML-% IV SOLN
1000.0000 mg | Freq: Once | INTRAVENOUS | Status: AC
  Administered 2021-11-30: 1000 mg via INTRAVENOUS
  Filled 2021-11-30: qty 200

## 2021-11-30 MED ORDER — MORPHINE SULFATE (PF) 4 MG/ML IV SOLN
4.0000 mg | Freq: Once | INTRAVENOUS | Status: AC
  Administered 2021-11-30: 4 mg via INTRAVENOUS
  Filled 2021-11-30: qty 1

## 2021-11-30 MED ORDER — SODIUM CHLORIDE 0.9 % IV SOLN
1.0000 g | Freq: Once | INTRAVENOUS | Status: AC
  Administered 2021-11-30: 1 g via INTRAVENOUS
  Filled 2021-11-30: qty 10

## 2021-11-30 MED ORDER — IOHEXOL 350 MG/ML SOLN
100.0000 mL | Freq: Once | INTRAVENOUS | Status: AC | PRN
  Administered 2021-11-30: 100 mL via INTRAVENOUS

## 2021-11-30 MED ORDER — CYCLOBENZAPRINE HCL 10 MG PO TABS
10.0000 mg | ORAL_TABLET | Freq: Once | ORAL | Status: AC
  Administered 2021-11-30: 10 mg via ORAL
  Filled 2021-11-30: qty 1

## 2021-11-30 MED ORDER — VANCOMYCIN HCL 500 MG/100ML IV SOLN
500.0000 mg | Freq: Once | INTRAVENOUS | Status: DC
  Filled 2021-11-30: qty 100

## 2021-11-30 MED ORDER — VANCOMYCIN HCL IN DEXTROSE 1-5 GM/200ML-% IV SOLN
1000.0000 mg | Freq: Two times a day (BID) | INTRAVENOUS | Status: DC
  Administered 2021-12-01 (×2): 1000 mg via INTRAVENOUS
  Filled 2021-11-30 (×3): qty 200

## 2021-11-30 NOTE — ED Notes (Signed)
PA at Bedside.

## 2021-11-30 NOTE — ED Provider Notes (Signed)
[place] EMERGENCY DEPT Provider Note   CSN: [id number] Arrival date & time: 11/30/21  1027     History  Chief Complaint  Patient presents with   Arm Swelling    Jeffrey Price is a 66 y.o. male with a past medical history significant for hypertension, diabetes, paroxysmal atrial fibrillation, and chronic shortness of breath who presents to the ED due to left upper extremity edema x5 days.  Patient states edema started at his elbow and is progressively spread throughout his left arm.  Denies any injury.  No fever or chills.  Patient has a previous history of blood clots.  He is currently on Xarelto given he is in permanent A. fib which she has been on for the past 30 days.  Patient has been compliant with his Xarelto with no missed doses.  Denies associated numbness/tingling.  Denies associated chest pain.  Patient admits to chronic shortness of breath with no change in characteristic over the past 5 days.       Home Medications Prior to Admission medications   Medication Sig Start Date End Date Taking? Authorizing Provider  atenolol (TENORMIN) 100 MG tablet Take 1 tablet (100 mg total) by mouth daily. 10/19/21 10/19/22  Caren Macadam, MD  atorvastatin (LIPITOR) 20 MG tablet TAKE 1 TABLET BY MOUTH ONCE DAILY 10/19/21 10/19/22  Caren Macadam, MD  cetirizine (ZYRTEC) 10 MG tablet Take 10 mg by mouth daily as needed for allergies.    [provider]  clotrimazole (CLOTRIMAZOLE AF) 1 % cream Apply topically twice daily as needed (rash). 10/09/21   Caren Macadam, MD  Cyanocobalamin (B-12 SL) Place 1 tablet under the tongue daily.    [provider]  cyclobenzaprine (FLEXERIL) 10 MG tablet TAKE 1 TABLET BY MOUTH 3 TIMES DAILY AS NEEDED FOR MUSCLE SPASMS Patient taking differently: Take 10 mg by mouth at bedtime as needed for muscle spasms. 10/09/21 10/09/22  Caren Macadam, MD  empagliflozin (JARDIANCE) 10 MG TABS tablet Take 1 tablet  (10 mg total) by mouth daily before breakfast. 10/19/21   Koberlein, Steele Berg, MD  furosemide (LASIX) 40 MG tablet Take 2 tablets (80 mg total) by mouth daily. 10/19/21 10/19/22  Caren Macadam, MD  meloxicam (MOBIC) 15 MG tablet TAKE 1 TABLET BY MOUTH ONCE DAILY AS NEEDED FOR INFLAMMATION 10/09/21 10/09/22  Caren Macadam, MD  methimazole (TAPAZOLE) 5 MG tablet Take 1 tablet (5 mg total) by mouth every morning. 10/19/21 10/19/22  Caren Macadam, MD  methocarbamol (ROBAXIN) 500 MG tablet Take 500 mg by mouth every 6 (six) hours as needed for muscle spasms.    [provider]  NEEDLE, DISP, 18 G (BD HYPODERMIC NEEDLE) 18G X 1" MISC Use as directed with Testosterone 06/22/21   Koberlein, Junell C, MD  NEEDLE, DISP, 22 G (BD ECLIPSE NEEDLE) 22G X 1-1/2" MISC Use as directed 08/02/19   Koberlein, Andris Flurry C, MD  pantoprazole (PROTONIX) 40 MG tablet TAKE 1 TABLET BY MOUTH DAILY * PT NEEDS APPT 10/19/21 10/19/22  Caren Macadam, MD  pregabalin (LYRICA) 200 MG capsule TAKE 1 CAPSULE BY MOUTH 2 TIMES DAILY * DO NOT TAKE GABAPENTIN WHILE TAKING THIS MED 10/19/21 04/17/22  Caren Macadam, MD  rivaroxaban (XARELTO) 20 MG TABS tablet Take 1 tablet by mouth daily with supper. 11/16/21   Caren Macadam, MD  sildenafil (VIAGRA) 100 MG tablet Take 1/2-1 tablet by mouth daily as needed for erectile dysfunction. 10/19/21   Micheline Rough  C, MD  Syringe, Disposable, 3 ML MISC Use as directed 08/02/19   Koberlein, Junell C, MD  SYRINGE-NEEDLE, DISP, 3 ML (B-D 3CC LUER-LOK SYR 22GX1") 22G X 1" 3 ML MISC Use as directed with Testosterone 06/22/21   Koberlein, Steele Berg, MD  testosterone cypionate (DEPOTESTOSTERONE CYPIONATE) 200 MG/ML injection INJECT 1 ML INTO THE MUSCLE EVERY 14 DAYS 06/22/21 01/06/22  Koberlein, Steele Berg, MD  tretinoin (RETIN-A) 0.05 % cream Apply 1 application topically at bedtime.    [provider]  triamcinolone cream (KENALOG) 0.1 % Apply to affected area(s)  twice daily for 14 days Patient taking differently: 1 application daily as needed (rash). 10/19/21   Caren Macadam, MD      Allergies    Patient has no known allergies.    Review of Systems   Review of Systems  Constitutional:  Negative for chills and fever.  Respiratory:  Positive for shortness of breath. Apnea: chronic.  Cardiovascular:  Negative for chest pain.   Physical Exam Updated Vital Signs BP (!) 120/100 (BP Location: Right Arm)    Pulse 92    Temp 98.4 F (36.9 C)    Resp 20    Ht 6\' 2"  (1.88 m)    Wt 127.9 kg    SpO2 91%    BMI 36.20 kg/m  Physical Exam Vitals and nursing note reviewed.  Constitutional:      General: He is not in acute distress.    Appearance: He is not ill-appearing.  HENT:     Head: Normocephalic.  Eyes:     Pupils: Pupils are equal, round, and reactive to light.  Cardiovascular:     Rate and Rhythm: Normal rate and regular rhythm.     Pulses: Normal pulses.     Heart sounds: Normal heart sounds. No murmur heard.   No friction rub. No gallop.  Pulmonary:     Effort: Pulmonary effort is normal.     Breath sounds: Normal breath sounds.  Abdominal:     General: Abdomen is flat. There is no distension.     Palpations: Abdomen is soft.     Tenderness: There is no abdominal tenderness. There is no guarding or rebound.  Musculoskeletal:        General: Normal range of motion.     Cervical back: Neck supple.     Comments: Edema to left elbow and hand. Mild erythema to elbow and hand. Radial pulse intact.   Skin:    General: Skin is warm and dry.  Neurological:     General: No focal deficit present.     Mental Status: He is alert.  Psychiatric:        Mood and Affect: Mood normal.        Behavior: Behavior normal.    ED Results / Procedures / Treatments   Labs (all labs ordered are listed, but only abnormal results are displayed) Labs Reviewed  COMPREHENSIVE METABOLIC PANEL - Abnormal; Notable for the following components:       Result Value   Sodium 134 (*)    Glucose, Bld 107 (*)    All other components within normal limits  CBC WITH DIFFERENTIAL/PLATELET - Abnormal; Notable for the following components:   RBC 5.98 (*)    Monocytes Absolute 1.2 (*)    All other components within normal limits  URINALYSIS, ROUTINE W REFLEX MICROSCOPIC - Abnormal; Notable for the following components:   Color, Urine COLORLESS (*)    Glucose, UA >1,000 (*)  All other components within normal limits  CULTURE, BLOOD (ROUTINE X 2)  CULTURE, BLOOD (ROUTINE X 2)  RESP PANEL BY RT-PCR (FLU A&B, COVID) ARPGX2  BODY FLUID CULTURE W GRAM STAIN  GRAM STAIN  LACTIC ACID, PLASMA  LACTIC ACID, PLASMA  URIC ACID  SEDIMENTATION RATE  C-REACTIVE PROTEIN  SYNOVIAL CELL COUNT + DIFF, W/ CRYSTALS  GLUCOSE, BODY FLUID OTHER            CBG MONITORING, ED    EKG EKG Interpretation  Date/Time:  Monday November 30 2021 10:43:27 EST Ventricular Rate:  93 PR Interval:    QRS Duration: 78 QT Interval:  338 QTC Calculation: 420 R Axis:   9 Text Interpretation: Atrial fibrillation Abnormal ECG When compared with ECG of 15-Jun-2021 14:59, Atrial fibrillation has replaced Sinus rhythm Vent. rate has increased BY  31 BPM QRS axis Shifted right similar to prior Confirmed by Wynona Dove ([id number]) on 11/30/2021 12:43:45 PM  Radiology DG Elbow Complete Left  Result Date: 11/30/2021 CLINICAL DATA:  Elbow edema, suspected infection, stiffness pain and swelling EXAM: LEFT ELBOW - COMPLETE 3+ VIEW COMPARISON:  None FINDINGS: Diffuse soft tissue swelling, focally greatest over olecranon. Osseous mineralization normal. Joint spaces preserved. No acute fracture, dislocation, or bone destruction. No joint effusion. IMPRESSION: Diffuse soft tissue swelling including focal soft tissue swelling over the olecranon, cannot exclude olecranon bursitis or infection. No acute osseous abnormalities. Electronically Signed   By: Lavonia Dana M.D.   On: 11/30/2021 15:01   CT  Angio Chest PE W and/or Wo Contrast  Result Date: 11/30/2021 CLINICAL DATA:  Pulmonary embolism (PE) suspected, high prob EXAM: CT ANGIOGRAPHY CHEST WITH CONTRAST TECHNIQUE: Multidetector CT imaging of the chest was performed using the standard protocol during bolus administration of intravenous contrast. Multiplanar CT image reconstructions and MIPs were obtained to evaluate the vascular anatomy. RADIATION DOSE REDUCTION: This exam was performed according to the departmental dose-optimization program which includes automated exposure control, adjustment of the mA and/or kV according to patient size and/or use of iterative reconstruction technique. CONTRAST:  121mL OMNIPAQUE IOHEXOL 350 MG/ML SOLN COMPARISON:  Chest XR, 10/19/21 FINDINGS: Suboptimal evaluation, secondary to motion and poor contrast opacification such that subsegmental emboli could missed. Cardiovascular: *Satisfactory opacification of the pulmonary arteries to the segmental level. No evidence of segmental or larger pulmonary embolus. *4.0 cm ascending thoracic aorta. *Normal heart size. No pericardial effusion. *Multivessel coronary artery atherosclerosis. Mediastinum/Nodes: Hilar node prominence, greater on RIGHT without discrete enlargement. No enlarged mediastinal or axillary lymph nodes. Thyroid gland, trachea, and esophagus demonstrate no significant findings. Lungs/Pleura: Apical-predominant centrilobular emphysematous lung change. RIGHT apical scarring. Trace posterior basilar atelectasis. No suspicious pulmonary nodule or mass. No pleural effusion or pneumothorax. Upper Abdomen: No acute abnormality. Musculoskeletal: No chest wall abnormality. No acute or significant osseous findings. Review of the MIP images confirms the above findings. IMPRESSION: 1. No segmental or larger pulmonary embolus. 2. Multivessel coronary atherosclerosis. 3. Apical predominant centrilobular emphysematous lung change. Aortic Atherosclerosis (ICD10-I70.0) and  Emphysema (ICD10-J43.9). 4. Ectatic ascending thoracic aorta, measuring 4.0 cm. Recommend annual imaging followup by CTA or MRA. This recommendation follows 2010 ACCF/AHA/AATS/ACR/ASA/SCA/SCAI/SIR/STS/SVM Guidelines for the Diagnosis and Management of Patients with Thoracic Aortic Disease. Circulation. 2010; 121: C947-S962. Aortic aneurysm NOS (ICD10-I71.9) Electronically Signed   By: Michaelle Birks M.D.   On: 11/30/2021 13:21   US Venous Img Upper Left (DVT Study)  Result Date: 11/30/2021 CLINICAL DATA:  Arm swelling EXAM: LEFT UPPER EXTREMITY VENOUS DOPPLER ULTRASOUND TECHNIQUE: Gray-scale sonography with  graded compression, as well as color Doppler and duplex ultrasound were performed to evaluate the upper extremity deep venous system from the level of the subclavian vein and including the jugular, axillary, basilic, radial, ulnar and upper cephalic vein. Spectral Doppler was utilized to evaluate flow at rest and with distal augmentation maneuvers. COMPARISON:  None. FINDINGS: Contralateral Subclavian Vein: Respiratory phasicity is normal and symmetric with the symptomatic side. No evidence of thrombus. Normal compressibility. Internal Jugular Vein: No evidence of thrombus. Normal compressibility, respiratory phasicity and response to augmentation. Subclavian Vein: No evidence of thrombus. Normal compressibility, respiratory phasicity and response to augmentation. Axillary Vein: No evidence of thrombus. Normal compressibility, respiratory phasicity and response to augmentation. Cephalic Vein: No evidence of thrombus. Normal compressibility, respiratory phasicity and response to augmentation. Basilic Vein: No evidence of thrombus. Normal compressibility, respiratory phasicity and response to augmentation. Brachial Veins: No evidence of thrombus. Normal compressibility, respiratory phasicity and response to augmentation. Radial Veins: No evidence of thrombus. Normal compressibility, respiratory phasicity and  response to augmentation. Ulnar Veins: No evidence of thrombus. Normal compressibility, respiratory phasicity and response to augmentation. Other Findings:  None visualized. IMPRESSION: No evidence of DVT within the left upper extremity. Electronically Signed   By: Miachel Roux M.D.   On: 11/30/2021 13:51    Procedures Procedures    Medications Ordered in ED Medications  iohexol (OMNIPAQUE) 350 MG/ML injection 100 mL (100 mLs Intravenous Contrast Given 11/30/21 1300)  morphine 4 MG/ML injection 4 mg (4 mg Intravenous Given 11/30/21 1433)    ED Course/ Medical Decision Making/ A&P                           Medical Decision Making Amount and/or Complexity of Data Reviewed Independent Historian: friend    Details: friend at bedside External Data Reviewed: ECG and notes. Labs: ordered. Decision-making details documented in ED Course. Radiology: ordered and independent interpretation performed. Decision-making details documented in ED Course. ECG/medicine tests: ordered and independent interpretation performed. Decision-making details documented in ED Course.  Risk Prescription drug management. Decision regarding hospitalization.   66 year old male presents to the ED due to left upper extremity edema and erythema that has progressively worsened over the past 5 days.  Previous history of blood clots and is currently on Xarelto for the past 30 days due to chronic A. fib which he has been compliant with with no missed doses.  Denies any known injury to left upper extremity.  Patient states edema started in the left elbow and has progressively spread to his left hand.  Denies chest pain.  He endorses chronic shortness of breath with no change from baseline.  Upon arrival, vitals all within normal limits.  Patient is afebrile, not tachycardic or hypoxic.  Patient in no acute distress.  Tenderness throughout left elbow with surrounding edema and erythema.  LUE warm to touch. Edema spreads to left  hand.  Left upper extremity neurovascularly intact with soft compartments.  Low suspicion for compartment syndrome.  Suspect symptoms related to infectious etiology vs. DVT. [place] to rule out DVT. Labs to rule out infectious etiology. EKG to rule out cardiac etiology. Low suspicion for bony fracture given no known injury. On cardiac monitor during my initial evaluation, patient's O2 saturation between 89-90%; however friend at bedside notes patient "lives" at that. Given hypoxia, will obtain CTA chest to rule out PE.  This patient presents to the ED for concern of upper extremity edema this involves an extensive  number of treatment options, and is a complaint that carries with it a high risk of complications and morbidity.  The differential diagnosis includes DVT, cellulitis, bony fracture, septic joint  CBC reassuring.  No leukocytosis.  Normal hemoglobin.  Lactic acid normal at 1.1.  Low suspicion for sepsis.  CMP significant for mild hyponatremia 134.  Hyperglycemia 107 without signs of DKA.  UA negative for signs of infection. [place] negative for DVT. CTA negative for PE. Does show enlarged thoracic aorta. X-ray reviewed which demonstrates soft tissue edema. No bony fractures. Agree with radiology interpretation.   After discussion with Dr. Pearline Cables feel patient most likely has septic bursitis. Blood cultures added. IV antibiotics started. Will discuss with orthopedics. Patient will require admission.   4:13 PM Discussed with Dr. Mayer Camel with orhopedics who agrees to see patient. We had a long discussion about possible aspiration of elbow; however, I discussed with him my concerns about some cellulitis nature given the edema and warmth has spread down LUE. It was agreed that I will hold off on the aspiration and he will be evaluated by orthopedics when he gets to [facility] to determine need for aspiration.   3:50 PM Discussed with Dr. Rogers Blocker with [facility] who agrees to admit patient.   4:12 PM Dr. Mayer Camel actually came  to [facility] to perform the joint aspiration. Appreciate his help. Fluid sent to lab for analysis.         Final Clinical Impression(s) / ED Diagnoses Final diagnoses:  None    Rx / DC Orders ED Discharge Orders     None         Suzy Bouchard, PA-C 11/30/21 1613    Wynona Dove A, DO 12/01/21 0900

## 2021-11-30 NOTE — ED Notes (Signed)
Patient transported to Radiology 

## 2021-11-30 NOTE — Progress Notes (Signed)
Pharmacy Antibiotic Note  Jeffrey Price is a 66 y.o. male admitted on 11/30/2021 with cellulitis.  Pharmacy has been consulted for vancomycin dosing.  WBC is within normal limits at 9.9, patient remains afebrile. Renal function appears to be at baseline with Scr 1.02.  Plan: Vancomycin IV 2500 mg x 1  Vancomycin IV 1000 mg q12h (estimated AUC 422) Monitor renal function and signs of clinical improvement  Height: 6\' 2"  (188 cm) Weight: 127.9 kg (281 lb 15.5 oz) IBW/kg (Calculated) : 82.2  Temp (24hrs), Avg:98.4 F (36.9 C), Min:98.4 F (36.9 C), Max:98.4 F (36.9 C)  Recent Labs  Lab 11/26/21 1146 11/30/21 1202  WBC 10.4 9.9  CREATININE 1.19 1.02  LATICACIDVEN  --  1.1    Estimated Creatinine Clearance: 102.6 mL/min (by C-G formula based on SCr of 1.02 mg/dL).    No Known Allergies  Antimicrobials this admission: 1/23 vancomycin >>  1/23 ceftriaxone x 1  Dose adjustments this admission: none  Microbiology results: 1/23 BCx: pending 1/23 Joint/elbow fluid: pending   Thank you for involving pharmacy in this patient's care.  Elita Quick, PharmD PGY1 Ambulatory Care Pharmacy Resident 11/30/2021 7:11 PM  **Pharmacist phone directory can be found on [email or web address] listed under [place]**

## 2021-11-30 NOTE — Progress Notes (Signed)
Received a phone call from Facility: [facility]  Requesting MD: Dr. Pearline Cables PA: Charmaine Downs  Patient with h/o HTN, T2DM, graves dx, PAF on xarelto, chronic dyspnea presenting with left arm edema, erythema and pain since last Wednesday. Scheduled for cardioversion on Thursday. Had appointment with ortho for tomorrow, but symptoms worsened so came to ED.   Vitals stable, oxygen at 91%RA Pertinent labs: no WBC, LA wnl. Blood cx ordered.  [place] upper arm-no DVT, elbow xray diffuse soft tissue swelling.  CTA negative for PE, does have atypical centrilobular emphysematous lung change Ascending thoracic aorta, 4.0cm recommend annual f/u    Plan of care: ortho consulted and [facility] asked to admit. Dr. Mayer Camel is going to come to [facility] and aspirate elbow. Started on abx.   The patient will be accepted for admission to telemetry at [facility] when bed is available.  will be consulted by requesting physician prior to transfer.  Requested the transferring physician to get inflammatory markers/uric acid.   Nursing staff, Please call the [place] number at the top of [employer] at the time of the patient's arrival so that the patient can be paged to the admitting physician.   Casimer Bilis, M.D. [facility]

## 2021-11-30 NOTE — Consult Note (Signed)
Reason for Consult: Left forearm cellulitis, Left Elbow bursitis Referring Physician: Orma Flaming, MD  HPI: 66 y.o. male with a past medical history significant for hypertension, diabetes, paroxysmal atrial fibrillation, and chronic shortness of breath who presents to the ED due to left upper extremity edema x5 days.  Patient states edema started at his elbow and is progressively spread throughout his left arm.  Denies any injury. Reports unmeasured fever and chills.  Patient has a previous history of blood clots.  He is currently on Xarelto for A. fib which he has been on for the past 30 days.  Patient has been compliant with his Xarelto with no missed doses.  Denies cuts or abrasions. 1+ swelling of elbow. Past Medical History:  Diagnosis Date   Arthritis    in back   Blood clot in vein 1995   bil legs   Bulging lumbar disc    Cataract    mild   Cellulitis 2012   forehead   Complication of anesthesia    had trouble waking up 06/11/2020   DDD (degenerative disc disease), lumbar    GERD (gastroesophageal reflux disease)    Graves disease    Hx of adenomatous colonic polyps 12/15/2017   Hyperlipidemia    Hypertension    Neuromuscular disorder (HCC)    neuropathy in feet   Pre-diabetes    Thyrotoxicosis without thyroid storm    Varicose veins of both lower extremities     Past Surgical History:  Procedure Laterality Date   ANTERIOR CERVICAL DECOMP/DISCECTOMY FUSION N/A 06/18/2021   Procedure: ANTERIOR CERVICAL DECOMPRESSION FUSION CERVICAL4- CERVICAL 5 WITH INSTRUEMENTATION AND ALLOGRAFT;  Surgeon: Phylliss Bob, MD;  Location: [facility];  Service: Orthopedics;  Laterality: N/A;   BACK SURGERY  06/11/2020   ENDOVENOUS ABLATION SAPHENOUS VEIN W/ LASER Left 10/18/2019   endovenous laser ablation left greater saphenous vein and stab phlebectomy > 20 incisions left leg by Gae Gallop MD    ENDOVENOUS ABLATION SAPHENOUS VEIN W/ LASER Right 11/08/2019   endovenous laser ablation right  greater saphenous vein and stab phlebectomy 10-20 incisions right leg by Gae Gallop MD    LUMBAR EPIDURAL INJECTION      Family History  Problem Relation Age of Onset   Thyroid disease Mother    Parkinson's disease Father    Colon cancer Neg Hx    Rectal cancer Neg Hx    Stomach cancer Neg Hx    Esophageal cancer Neg Hx     Social History:  reports that he quit smoking about 10 years ago. His smoking use included cigarettes. He has never used smokeless tobacco. He reports current alcohol use of about 8.0 - 10.0 standard drinks per week. He reports that he does not use drugs.  Allergies: No Known Allergies  Medications: I have reviewed the patient's current medications.  Results for orders placed or performed during the hospital encounter of 11/30/21 (from the past 48 hour(s))  Lactic acid, plasma     Status: None   Collection Time: 11/30/21 12:02 PM  Result Value Ref Range   Lactic Acid, Venous 1.1 0.5 - 1.9 mmol/L    Comment: Performed at [facility], [address] [place], [place] [address]  Comprehensive metabolic panel     Status: Abnormal   Collection Time: 11/30/21 12:02 PM  Result Value Ref Range   Sodium 134 (L) 135 - 145 mmol/L   Potassium 3.9 3.5 - 5.1 mmol/L   Chloride 99 98 - 111 mmol/L  CO2 25 22 - 32 mmol/L   Glucose, Bld 107 (H) 70 - 99 mg/dL    Comment: Glucose reference range applies only to samples taken after fasting for at least 8 hours.   BUN 14 8 - 23 mg/dL   Creatinine, Ser 1.02 0.61 - 1.24 mg/dL   Calcium 9.0 8.9 - 10.3 mg/dL   Total Protein 7.4 6.5 - 8.1 g/dL   Albumin 4.0 3.5 - 5.0 g/dL   AST 19 15 - 41 U/L   ALT 13 0 - 44 U/L   Alkaline Phosphatase 75 38 - 126 U/L   Total Bilirubin 1.0 0.3 - 1.2 mg/dL   GFR, Estimated >60 >60 mL/min    Comment: (NOTE) Calculated using the CKD-EPI Creatinine Equation (2021)    Anion gap 10 5 - 15    Comment: Performed at [facility], [address]  [place], [place] [address]  CBC with Differential     Status: Abnormal   Collection Time: 11/30/21 12:02 PM  Result Value Ref Range   WBC 9.9 4.0 - 10.5 K/uL   RBC 5.98 (H) 4.22 - 5.81 MIL/uL   Hemoglobin 16.8 13.0 - 17.0 g/dL   HCT 51.8 39.0 - 52.0 %   MCV 86.6 80.0 - 100.0 fL   MCH 28.1 26.0 - 34.0 pg   MCHC 32.4 30.0 - 36.0 g/dL   RDW 14.6 11.5 - 15.5 %   Platelets 273 150 - 400 K/uL   nRBC 0.0 0.0 - 0.2 %   Neutrophils Relative % 66 %   Neutro Abs 6.6 1.7 - 7.7 K/uL   Lymphocytes Relative 18 %   Lymphs Abs 1.8 0.7 - 4.0 K/uL   Monocytes Relative 12 %   Monocytes Absolute 1.2 (H) 0.1 - 1.0 K/uL   Eosinophils Relative 2 %   Eosinophils Absolute 0.2 0.0 - 0.5 K/uL   Basophils Relative 1 %   Basophils Absolute 0.1 0.0 - 0.1 K/uL   Immature Granulocytes 1 %   Abs Immature Granulocytes 0.06 0.00 - 0.07 K/uL    Comment: Performed at [facility], [place], [place] [address]  Urinalysis, Routine w reflex microscopic Urine, Clean Catch     Status: Abnormal   Collection Time: 11/30/21 12:02 PM  Result Value Ref Range   Color, Urine COLORLESS (A) YELLOW   APPearance CLEAR CLEAR   Specific Gravity, Urine 1.007 1.005 - 1.030   pH 6.5 5.0 - 8.0   Glucose, UA >1,000 (A) NEGATIVE mg/dL   Hgb urine dipstick NEGATIVE NEGATIVE   Bilirubin Urine NEGATIVE NEGATIVE   Ketones, ur NEGATIVE NEGATIVE mg/dL   Protein, ur NEGATIVE NEGATIVE mg/dL   Nitrite NEGATIVE NEGATIVE   Leukocytes,Ua NEGATIVE NEGATIVE   RBC / HPF 0-5 0 - 5 RBC/hpf   WBC, UA 0-5 0 - 5 WBC/hpf   Squamous Epithelial / LPF 0-5 0 - 5   Mucus PRESENT     Comment: Performed at [facility], [address] [place], [place] [address]  Resp Panel by RT-PCR (Flu A&B, Covid) Nasopharyngeal Swab     Status: None   Collection Time: 11/30/21  3:23 PM   Specimen: Nasopharyngeal Swab; Nasopharyngeal(NP) swabs in vial transport medium  Result Value Ref Range   SARS Coronavirus 2 by RT  PCR NEGATIVE NEGATIVE    Comment: (NOTE) SARS-CoV-2 target nucleic acids are NOT DETECTED.  The SARS-CoV-2 RNA is generally detectable in upper respiratory specimens during the acute phase of infection. The lowest  concentration of SARS-CoV-2 viral copies this assay can detect is 138 copies/mL. A negative result does not preclude SARS-Cov-2 infection and should not be used as the sole basis for treatment or other patient management decisions. A negative result may occur with  improper specimen collection/handling, submission of specimen other than nasopharyngeal swab, presence of viral mutation(s) within the areas targeted by this assay, and inadequate number of viral copies(<138 copies/mL). A negative result must be combined with clinical observations, patient history, and epidemiological information. The expected result is Negative.  Fact Sheet for Patients:  EntrepreneurPulse.com.au  Fact Sheet for Healthcare Providers:  IncredibleEmployment.be  This test is no t yet approved or cleared by the [place] FDA and  has been authorized for detection and/or diagnosis of SARS-CoV-2 by FDA under an Emergency Use Authorization (EUA). This EUA will remain  in effect (meaning this test can be used) for the duration of the COVID-19 declaration under Section 564(b)(1) of the Act, 21 U.S.C.section 360bbb-3(b)(1), unless the authorization is terminated  or revoked sooner.       Influenza A by PCR NEGATIVE NEGATIVE   Influenza B by PCR NEGATIVE NEGATIVE    Comment: (NOTE) The Xpert Xpress SARS-CoV-2/FLU/RSV plus assay is intended as an aid in the diagnosis of influenza from Nasopharyngeal swab specimens and should not be used as a sole basis for treatment. Nasal washings and aspirates are unacceptable for Xpert Xpress SARS-CoV-2/FLU/RSV testing.  Fact Sheet for Patients: EntrepreneurPulse.com.au  Fact Sheet for Healthcare  Providers: IncredibleEmployment.be  This test is not yet approved or cleared by the [place] FDA and has been authorized for detection and/or diagnosis of SARS-CoV-2 by FDA under an Emergency Use Authorization (EUA). This EUA will remain in effect (meaning this test can be used) for the duration of the COVID-19 declaration under Section 564(b)(1) of the Act, 21 U.S.C. section 360bbb-3(b)(1), unless the authorization is terminated or revoked.  Performed at [facility], [address], [place], [place] [address]     DG Elbow Complete Left  Result Date: 11/30/2021 CLINICAL DATA:  Elbow edema, suspected infection, stiffness pain and swelling EXAM: LEFT ELBOW - COMPLETE 3+ VIEW COMPARISON:  None FINDINGS: Diffuse soft tissue swelling, focally greatest over olecranon. Osseous mineralization normal. Joint spaces preserved. No acute fracture, dislocation, or bone destruction. No joint effusion. IMPRESSION: Diffuse soft tissue swelling including focal soft tissue swelling over the olecranon, cannot exclude olecranon bursitis or infection. No acute osseous abnormalities. Electronically Signed   By: Lavonia Dana M.D.   On: 11/30/2021 15:01   CT Angio Chest PE W and/or Wo Contrast  Result Date: 11/30/2021 CLINICAL DATA:  Pulmonary embolism (PE) suspected, high prob EXAM: CT ANGIOGRAPHY CHEST WITH CONTRAST TECHNIQUE: Multidetector CT imaging of the chest was performed using the standard protocol during bolus administration of intravenous contrast. Multiplanar CT image reconstructions and MIPs were obtained to evaluate the vascular anatomy. RADIATION DOSE REDUCTION: This exam was performed according to the departmental dose-optimization program which includes automated exposure control, adjustment of the mA and/or kV according to patient size and/or use of iterative reconstruction technique. CONTRAST:  125mL OMNIPAQUE IOHEXOL 350 MG/ML SOLN COMPARISON:  Chest XR,  10/19/21 FINDINGS: Suboptimal evaluation, secondary to motion and poor contrast opacification such that subsegmental emboli could missed. Cardiovascular: *Satisfactory opacification of the pulmonary arteries to the segmental level. No evidence of segmental or larger pulmonary embolus. *4.0 cm ascending thoracic aorta. *Normal heart size. No pericardial effusion. *Multivessel coronary artery atherosclerosis. Mediastinum/Nodes: Hilar node prominence, greater on RIGHT  without discrete enlargement. No enlarged mediastinal or axillary lymph nodes. Thyroid gland, trachea, and esophagus demonstrate no significant findings. Lungs/Pleura: Apical-predominant centrilobular emphysematous lung change. RIGHT apical scarring. Trace posterior basilar atelectasis. No suspicious pulmonary nodule or mass. No pleural effusion or pneumothorax. Upper Abdomen: No acute abnormality. Musculoskeletal: No chest wall abnormality. No acute or significant osseous findings. Review of the MIP images confirms the above findings. IMPRESSION: 1. No segmental or larger pulmonary embolus. 2. Multivessel coronary atherosclerosis. 3. Apical predominant centrilobular emphysematous lung change. Aortic Atherosclerosis (ICD10-I70.0) and Emphysema (ICD10-J43.9). 4. Ectatic ascending thoracic aorta, measuring 4.0 cm. Recommend annual imaging followup by CTA or MRA. This recommendation follows 2010 ACCF/AHA/AATS/ACR/ASA/SCA/SCAI/SIR/STS/SVM Guidelines for the Diagnosis and Management of Patients with Thoracic Aortic Disease. Circulation. 2010; 121: F681-E751. Aortic aneurysm NOS (ICD10-I71.9) Electronically Signed   By: Michaelle Birks M.D.   On: 11/30/2021 13:21   US Venous Img Upper Left (DVT Study)  Result Date: 11/30/2021 CLINICAL DATA:  Arm swelling EXAM: LEFT UPPER EXTREMITY VENOUS DOPPLER ULTRASOUND TECHNIQUE: Gray-scale sonography with graded compression, as well as color Doppler and duplex ultrasound were performed to evaluate the upper extremity deep  venous system from the level of the subclavian vein and including the jugular, axillary, basilic, radial, ulnar and upper cephalic vein. Spectral Doppler was utilized to evaluate flow at rest and with distal augmentation maneuvers. COMPARISON:  None. FINDINGS: Contralateral Subclavian Vein: Respiratory phasicity is normal and symmetric with the symptomatic side. No evidence of thrombus. Normal compressibility. Internal Jugular Vein: No evidence of thrombus. Normal compressibility, respiratory phasicity and response to augmentation. Subclavian Vein: No evidence of thrombus. Normal compressibility, respiratory phasicity and response to augmentation. Axillary Vein: No evidence of thrombus. Normal compressibility, respiratory phasicity and response to augmentation. Cephalic Vein: No evidence of thrombus. Normal compressibility, respiratory phasicity and response to augmentation. Basilic Vein: No evidence of thrombus. Normal compressibility, respiratory phasicity and response to augmentation. Brachial Veins: No evidence of thrombus. Normal compressibility, respiratory phasicity and response to augmentation. Radial Veins: No evidence of thrombus. Normal compressibility, respiratory phasicity and response to augmentation. Ulnar Veins: No evidence of thrombus. Normal compressibility, respiratory phasicity and response to augmentation. Other Findings:  None visualized. IMPRESSION: No evidence of DVT within the left upper extremity. Electronically Signed   By: Miachel Roux M.D.   On: 11/30/2021 13:51    Review of Systems: Denies SOB, denies chest pain.  Blood pressure (!) 120/100, pulse 92, temperature 98.4 F (36.9 C), resp. rate 20, height 6\' 2"  (1.88 m), weight 127.9 kg, SpO2 91 %. Physical Exam: L forearm swelling and erythema, 1+ warmth. Elbow ROM 5-120 degrees. 1+ olecranon bursitis. After discussion of risks and benefits skin over L olecranon process sterilly prepped with alcohol and bursa aspirated with 18 gauge  x 1 1/2 inch needle. Yielded 2cc of sero sanginous fluid sent off for culture, gram stain, crystals, cell count.   Assessment/Plan: L elbow/forearm cellulitis bursitis. Agree with hospitalist admit and IV abx. Adjust ABX pending cultures.  Kerin Salen 11/30/2021, 4:30 PM

## 2021-11-30 NOTE — ED Notes (Signed)
Pt in CT, will get VS upon returning to ED

## 2021-11-30 NOTE — Telephone Encounter (Signed)
Pt has an appt with orthopaedic and will wait to see ortho tomorrow for arm swelling

## 2021-11-30 NOTE — ED Notes (Signed)
Patient returned from Radiology. 

## 2021-11-30 NOTE — ED Triage Notes (Signed)
Pt via pov from home with arm swelling, redness, and pain since Wednesday last week. Pt is scheduled for cardioversion on Thursday, has an appointment with ortho for the same tomorrow. Pt has hx of blood clots and cellulitis. Pt alert & oriented, nad noted.

## 2021-12-01 ENCOUNTER — Telehealth: Payer: Self-pay | Admitting: *Deleted

## 2021-12-01 ENCOUNTER — Encounter (HOSPITAL_COMMUNITY): Payer: Self-pay | Admitting: Internal Medicine

## 2021-12-01 DIAGNOSIS — G4733 Obstructive sleep apnea (adult) (pediatric): Secondary | ICD-10-CM | POA: Insufficient documentation

## 2021-12-01 DIAGNOSIS — J449 Chronic obstructive pulmonary disease, unspecified: Secondary | ICD-10-CM | POA: Diagnosis not present

## 2021-12-01 DIAGNOSIS — E0500 Thyrotoxicosis with diffuse goiter without thyrotoxic crisis or storm: Secondary | ICD-10-CM

## 2021-12-01 DIAGNOSIS — G4734 Idiopathic sleep related nonobstructive alveolar hypoventilation: Secondary | ICD-10-CM

## 2021-12-01 DIAGNOSIS — B9561 Methicillin susceptible Staphylococcus aureus infection as the cause of diseases classified elsewhere: Secondary | ICD-10-CM | POA: Diagnosis not present

## 2021-12-01 DIAGNOSIS — M71122 Other infective bursitis, left elbow: Secondary | ICD-10-CM | POA: Diagnosis not present

## 2021-12-01 DIAGNOSIS — E1140 Type 2 diabetes mellitus with diabetic neuropathy, unspecified: Secondary | ICD-10-CM | POA: Diagnosis not present

## 2021-12-01 DIAGNOSIS — Z7901 Long term (current) use of anticoagulants: Secondary | ICD-10-CM | POA: Diagnosis not present

## 2021-12-01 DIAGNOSIS — I2510 Atherosclerotic heart disease of native coronary artery without angina pectoris: Secondary | ICD-10-CM | POA: Diagnosis not present

## 2021-12-01 DIAGNOSIS — I480 Paroxysmal atrial fibrillation: Secondary | ICD-10-CM

## 2021-12-01 DIAGNOSIS — Z794 Long term (current) use of insulin: Secondary | ICD-10-CM | POA: Diagnosis not present

## 2021-12-01 DIAGNOSIS — I10 Essential (primary) hypertension: Secondary | ICD-10-CM | POA: Diagnosis not present

## 2021-12-01 DIAGNOSIS — M79622 Pain in left upper arm: Secondary | ICD-10-CM | POA: Diagnosis present

## 2021-12-01 DIAGNOSIS — I7121 Aneurysm of the ascending aorta, without rupture: Secondary | ICD-10-CM | POA: Diagnosis not present

## 2021-12-01 DIAGNOSIS — E785 Hyperlipidemia, unspecified: Secondary | ICD-10-CM

## 2021-12-01 DIAGNOSIS — Z20822 Contact with and (suspected) exposure to covid-19: Secondary | ICD-10-CM | POA: Diagnosis not present

## 2021-12-01 DIAGNOSIS — I712 Thoracic aortic aneurysm, without rupture, unspecified: Secondary | ICD-10-CM | POA: Diagnosis present

## 2021-12-01 DIAGNOSIS — J439 Emphysema, unspecified: Secondary | ICD-10-CM | POA: Diagnosis present

## 2021-12-01 DIAGNOSIS — Z79899 Other long term (current) drug therapy: Secondary | ICD-10-CM | POA: Diagnosis not present

## 2021-12-01 LAB — LACTIC ACID, PLASMA: Lactic Acid, Venous: 1.2 mmol/L (ref 0.5–1.9)

## 2021-12-01 LAB — MRSA NEXT GEN BY PCR, NASAL: MRSA by PCR Next Gen: NOT DETECTED

## 2021-12-01 LAB — GLUCOSE, BODY FLUID OTHER: Glucose, Body Fluid Other: 26 mg/dL

## 2021-12-01 MED ORDER — DOCUSATE SODIUM 100 MG PO CAPS
100.0000 mg | ORAL_CAPSULE | Freq: Two times a day (BID) | ORAL | Status: DC
  Administered 2021-12-01 – 2021-12-02 (×2): 100 mg via ORAL
  Filled 2021-12-01 (×2): qty 1

## 2021-12-01 MED ORDER — LORAZEPAM 1 MG PO TABS: 1.0000 mg | ORAL_TABLET | ORAL | Status: DC | PRN

## 2021-12-01 MED ORDER — ACETAMINOPHEN 325 MG PO TABS
650.0000 mg | ORAL_TABLET | Freq: Four times a day (QID) | ORAL | Status: DC | PRN
  Administered 2021-12-01: 23:00:00 650 mg via ORAL
  Filled 2021-12-01: qty 2

## 2021-12-01 MED ORDER — FUROSEMIDE 40 MG PO TABS
80.0000 mg | ORAL_TABLET | Freq: Every day | ORAL | Status: DC
  Administered 2021-12-01: 12:00:00 80 mg via ORAL
  Filled 2021-12-01: qty 2

## 2021-12-01 MED ORDER — OXYCODONE HCL 5 MG PO TABS
5.0000 mg | ORAL_TABLET | ORAL | Status: DC | PRN
  Administered 2021-12-01: 20:00:00 5 mg via ORAL
  Filled 2021-12-01: qty 1

## 2021-12-01 MED ORDER — VANCOMYCIN HCL 500 MG IV SOLR
INTRAVENOUS | Status: AC
  Administered 2021-12-01: 500 mg
  Filled 2021-12-01: qty 10

## 2021-12-01 MED ORDER — ONDANSETRON HCL 4 MG/2ML IJ SOLN: 4.0000 mg | Freq: Four times a day (QID) | INTRAMUSCULAR | Status: DC | PRN

## 2021-12-01 MED ORDER — SODIUM CHLORIDE 0.9 % IV SOLN
1.0000 g | INTRAVENOUS | Status: DC
  Administered 2021-12-01: 17:00:00 1 g via INTRAVENOUS
  Filled 2021-12-01: qty 10

## 2021-12-01 MED ORDER — ATORVASTATIN CALCIUM 10 MG PO TABS
20.0000 mg | ORAL_TABLET | Freq: Every day | ORAL | Status: DC
  Administered 2021-12-01 – 2021-12-02 (×2): 20 mg via ORAL
  Filled 2021-12-01 (×2): qty 2

## 2021-12-01 MED ORDER — HYDRALAZINE HCL 20 MG/ML IJ SOLN: 5.0000 mg | INTRAMUSCULAR | Status: DC | PRN

## 2021-12-01 MED ORDER — LORAZEPAM 2 MG/ML IJ SOLN: 1.0000 mg | INTRAMUSCULAR | Status: DC | PRN

## 2021-12-01 MED ORDER — ATENOLOL 50 MG PO TABS
100.0000 mg | ORAL_TABLET | Freq: Every day | ORAL | Status: DC
  Administered 2021-12-01: 12:00:00 100 mg via ORAL
  Filled 2021-12-01: qty 2

## 2021-12-01 MED ORDER — MORPHINE SULFATE (PF) 2 MG/ML IV SOLN: 2.0000 mg | INTRAVENOUS | Status: DC | PRN

## 2021-12-01 MED ORDER — NICOTINE 7 MG/24HR TD PT24: 7.0000 mg | MEDICATED_PATCH | Freq: Every day | TRANSDERMAL | Status: DC | PRN

## 2021-12-01 MED ORDER — POLYETHYLENE GLYCOL 3350 17 G PO PACK: 17.0000 g | PACK | Freq: Every day | ORAL | Status: DC | PRN

## 2021-12-01 MED ORDER — LACTATED RINGERS IV SOLN: INTRAVENOUS | Status: DC

## 2021-12-01 MED ORDER — ACETAMINOPHEN 650 MG RE SUPP: 650.0000 mg | Freq: Four times a day (QID) | RECTAL | Status: DC | PRN

## 2021-12-01 MED ORDER — THIAMINE HCL 100 MG/ML IJ SOLN
100.0000 mg | Freq: Every day | INTRAMUSCULAR | Status: DC
  Filled 2021-12-01: qty 2

## 2021-12-01 MED ORDER — FOLIC ACID 1 MG PO TABS
1.0000 mg | ORAL_TABLET | Freq: Every day | ORAL | Status: DC
  Administered 2021-12-01 – 2021-12-02 (×2): 1 mg via ORAL
  Filled 2021-12-01 (×2): qty 1

## 2021-12-01 MED ORDER — CYCLOBENZAPRINE HCL 5 MG PO TABS
10.0000 mg | ORAL_TABLET | Freq: Every evening | ORAL | Status: DC | PRN
  Administered 2021-12-01: 23:00:00 10 mg via ORAL
  Filled 2021-12-01: qty 2

## 2021-12-01 MED ORDER — THIAMINE HCL 100 MG PO TABS
100.0000 mg | ORAL_TABLET | Freq: Every day | ORAL | Status: DC
  Administered 2021-12-01 – 2021-12-02 (×2): 100 mg via ORAL
  Filled 2021-12-01 (×2): qty 1

## 2021-12-01 MED ORDER — RIVAROXABAN 20 MG PO TABS
20.0000 mg | ORAL_TABLET | Freq: Every day | ORAL | Status: DC
  Administered 2021-12-01: 17:00:00 20 mg via ORAL
  Filled 2021-12-01: qty 1

## 2021-12-01 MED ORDER — PREGABALIN 100 MG PO CAPS
200.0000 mg | ORAL_CAPSULE | Freq: Two times a day (BID) | ORAL | Status: DC
  Administered 2021-12-01 – 2021-12-02 (×3): 200 mg via ORAL
  Filled 2021-12-01 (×3): qty 2

## 2021-12-01 MED ORDER — ONDANSETRON HCL 4 MG PO TABS: 4.0000 mg | ORAL_TABLET | Freq: Four times a day (QID) | ORAL | Status: DC | PRN

## 2021-12-01 MED ORDER — METHIMAZOLE 5 MG PO TABS
5.0000 mg | ORAL_TABLET | Freq: Every morning | ORAL | Status: DC
  Administered 2021-12-01 – 2021-12-02 (×2): 5 mg via ORAL
  Filled 2021-12-01 (×2): qty 1

## 2021-12-01 MED ORDER — ADULT MULTIVITAMIN W/MINERALS CH
1.0000 | ORAL_TABLET | Freq: Every day | ORAL | Status: DC
  Administered 2021-12-01 – 2021-12-02 (×2): 1 via ORAL
  Filled 2021-12-01 (×2): qty 1

## 2021-12-01 MED ORDER — BISACODYL 5 MG PO TBEC: 5.0000 mg | DELAYED_RELEASE_TABLET | Freq: Every day | ORAL | Status: DC | PRN

## 2021-12-01 MED ORDER — EMPAGLIFLOZIN 10 MG PO TABS
10.0000 mg | ORAL_TABLET | Freq: Every day | ORAL | Status: DC
  Administered 2021-12-01: 12:00:00 10 mg via ORAL
  Filled 2021-12-01: qty 1

## 2021-12-01 MED ORDER — PANTOPRAZOLE SODIUM 40 MG PO TBEC
40.0000 mg | DELAYED_RELEASE_TABLET | Freq: Every day | ORAL | Status: DC
  Administered 2021-12-01 – 2021-12-02 (×2): 40 mg via ORAL
  Filled 2021-12-01 (×2): qty 1
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
## 2021-12-01 NOTE — Assessment & Plan Note (Signed)
-  Continue Atenolol -Will add prn IV hydralazine

## 2021-12-01 NOTE — Addendum Note (Signed)
Addended by: Larey Seat on: 12/01/2021 01:24 PM   Modules accepted: Orders

## 2021-12-01 NOTE — Assessment & Plan Note (Signed)
-  Rate controlled with atenolol -Continue Xarelto

## 2021-12-01 NOTE — ED Notes (Signed)
Waiting on meds from [facility] to arrive

## 2021-12-01 NOTE — Progress Notes (Signed)
Set patient up on CPAP.  Patient on auto titrate with 3 lpm oxygen bled in tolerating well.

## 2021-12-01 NOTE — Assessment & Plan Note (Signed)
-  Continue Lipitor °

## 2021-12-01 NOTE — Assessment & Plan Note (Signed)
-  Appreciated on chest CTA -Continue Xarelto -Suggest outpatient cardiology f/u

## 2021-12-01 NOTE — Progress Notes (Signed)
°  Transition of Care [facility]) Screening Note   Patient Details  Name: QUINZELL MALCOMB Date of Birth: Apr 21, 1956   Transition of Care [facility]) CM/SW Contact:    Cyndi Bender, RN Phone Number: 12/01/2021, 12:48 PM    Transition of Care Department [facility]) has reviewed patient and no TOC needs have been identified at this time. We will continue to monitor patient advancement through interdisciplinary progression rounds. If new patient transition needs arise, please place a TOC consult.

## 2021-12-01 NOTE — Progress Notes (Signed)
Subjective: Patient reports continued discomfort in his left upper extremity specifically the forearm and elbow.  Olecranon bursa aspirate yesterday yielded abundant white blood cells polys and monos with gram-positive cocci.  Cell count was 7000 white cells.  He was started on vancomycin yesterday afternoon and reports overall his left upper extremity is unchanged.  Objective: Vital signs in last 24 hours: Temp:  [98.4 F (36.9 C)] 98.4 F (36.9 C) (01/23 1038) Pulse Rate:  [70-94] 94 (01/24 0600) Resp:  [15-23] 23 (01/24 0600) BP: (86-134)/(44-100) 95/64 (01/24 0600) SpO2:  [88 %-96 %] 96 % (01/24 0600) Weight:  [127.9 kg] 127.9 kg (01/23 1132)  Intake/Output from previous day: 01/23 0701 - 01/24 0700 In: 540.8 [IV Piggyback:540.8] Out: -  Intake/Output this shift: No intake/output data recorded.  Recent Labs    11/30/21 1202  HGB 16.8   Recent Labs    11/30/21 1202  WBC 9.9  RBC 5.98*  HCT 51.8  PLT 273   Recent Labs    11/30/21 1202  NA 134*  K 3.9  CL 99  CO2 25  BUN 14  CREATININE 1.02  GLUCOSE 107*  CALCIUM 9.0   No results for input(s): LABPT, INR in the last 72 hours.  Physical exam: Patient continues have cellulitis to the left upper extremity worse in the hand and forearm extending up to the elbow.  The olecranon bursa has a 1+ effusion unchanged from yesterday.  Range of motion of the elbow is 20 to 120 degrees with some mild discomfort.  Fingers are pink and well-perfused neurologically intact distally.    Assessment/Plan: Left upper extremity cellulitis, septic olecranon bursitis.  Aspirate yesterday showed 7000 white cells polys and monos.  Gram stain showed gram-positive cocci patient presently on vancomycin..  Plan: Continue IV vancomycin.  Serial exam of left upper extremity to make sure that the cellulitis is resolving and there is no further accumulation of fluid in the olecranon bursa.  We will follow closely.   Jeffrey Price 12/01/2021,  7:32 AM

## 2021-12-01 NOTE — Assessment & Plan Note (Signed)
-  4 cm aneurysm -Needs outpatient vascular f/u and annual imaging

## 2021-12-01 NOTE — Assessment & Plan Note (Signed)
-  Recent A1c was 6.8, indicating good control -hold Jardiance -Cover with moderate-scale SSI

## 2021-12-01 NOTE — Procedures (Signed)
[facility] TEST REPORT ( by [facility] PAT)   STUDY DATA available as of:  11-23-2021    ORDERING CLINICIAN: Larey Seat, MD  REFERRING CLINICIAN: Dr Tana Felts, MD  Consultation on 10/23/2021 from PCP Tana Felts, MD,  for an apnea evaluation.    CLINICAL INFORMATION/HISTORY: 66 year -old male patient with new atrial fib onset:   Reports his girlfriend has witnessed snoring and apnoeic events while he is sleeping. He has severe hearing loss, talks loudly.  Girl Friend has recorded his apnoeic breathing.  ONO by PCP was abnormal, EKG showed atrail fib.  SOB was severe.  He had a surgery under general anesthesia and had trouble to be extubated, to breathe on his own.       Epworth sleepiness score: 16/24.   BMI: 37.1kg/m   Neck Circumference: 20"   FINDINGS:   Sleep Summary:   Total Recording Time (hours, min): Total recording time amounted to 9 hours and 38 minutes of which 9 hours and 7 minutes was a total recorded sleep time.  18% of total sleep time were REM sleep.                                      Respiratory Indices:   Calculated pAHI (per hour):   Apnea-hypopnea index was severe: overall 74.3/h, during REM sleep 83/h, and during non-REM sleep 73.3/h.                                             Positional AHI: In supine sleep the AHI exacerbated to 81.1/h in prone sleep 40.9/h on the left side 75.6/h.  Apnea was severe in all sleep positions.  Snoring level was far above average with a mean volume of 47 dB.  Snoring accompanied 65% of the total recorded sleep time.  About an hour of total sleep time was accompanied by snoring over 60 dB loud.                                                  Oxygen Saturation Statistics:      O2 Saturation Range (%): Nadir was captured at 60% saturation was a maximum at 95% saturation with a mean saturation of 83%.                                      O2 Saturation (minutes) <89%:    328 minutes the  equivalent of 60% of total sleep time.  Saturation of oxygen under 90% was present for 355 minutes the equivalent of 65% of total sleep time.       Pulse Rate Statistics:   Pulse Mean (bpm):      85 bpm           Pulse Range: Highly variable with a minimum of 38 bpm and a maximum 130 bpm.               IMPRESSION:  This HST confirms the presence of very severe apnea which is slightly accentuated in rem sleep and therefore most  likely dominantly obstructive in origin.  However 24.6% of apneas were central or Cheyne-Stokes respiration related.  This is a very complex form of severe apnea associated with severe rest hypoxia and highly variable heart rate.  Most likely the patient was in atrial fibrillation throughout this night.  Positive airway pressure therapy should start immediately.   RECOMMENDATION: Auto CPAP with a setting from 8 through 20 cmH2O with 3 cm EPR, mask of choice and heated humidification.  This patient likely needs additional oxygen.  If the patient is established with a pulmonology specialist I would like him to continue his oxygen need work-up there. For our sleep lab to be able to provide oxygen and prescribe how many liters the patient should be given we would need to titrate him to oxygen while on CPAP. This requires an in- lab titration study.    INTERPRETING PHYSICIAN:   Larey Seat, MD   Medical Director of [facility].

## 2021-12-01 NOTE — Assessment & Plan Note (Signed)
-  He reports recent "bad" sleep study and likely need for nocturnal O2 -Telephone note from neurology today indicates need for CPAP -Will order for qhs use here

## 2021-12-01 NOTE — ED Notes (Signed)
Vancomycin 500mg  vial reconstituted and placed in 269ml bag of NS

## 2021-12-01 NOTE — Telephone Encounter (Signed)
I called pt. I advised pt that Dr. Brett Fairy reviewed their sleep study results and found that pt has severe sleep apnea. Dr. Brett Fairy recommends that pt start CPAP. I reviewed PAP compliance expectations with the pt. Pt is agreeable to starting a CPAP. I advised pt that an order will be sent to a DME, [facility], and [facility] will call the pt within about one week after they file with the pt's insurance. [facility] will show the pt how to use the machine, fit for masks, and troubleshoot the CPAP if needed. A follow up appt was made for insurance purposes with Maricela Bo, NP on 02/08/22 at 11:15am. Pt verbalized understanding to arrive 15 minutes early and bring their CPAP. A mychart message with all of this information will be sent to the pt as a reminder. I verified with the pt that this is preferred over letter. Pt verbalized understanding of results. Pt had no questions at this time but was encouraged to call back if questions arise. I have sent the order to [facility] and have received confirmation that they have received the order.  He is currently admitted in the hospital for septic bursitis, being treated w/ antibiotics per pt.

## 2021-12-01 NOTE — Assessment & Plan Note (Signed)
-  Continue methimazole -Recent thyroid testing done by PCP, no need for adjustment at this time

## 2021-12-01 NOTE — Telephone Encounter (Signed)
-----   Message from Larey Seat, MD sent at 12/01/2021  1:24 PM EST ----- IMPRESSION:  This HST confirms the presence of very severe apnea which is slightly accentuated in rem sleep and therefore most likely dominantly obstructive in origin.  However 24.6% of apneas were central or Cheyne-Stokes respiration related.  This is a very complex form of severe apnea associated with severe rest hypoxia and highly variable heart rate.  Most likely the patient was in atrial fibrillation throughout this night.  Positive airway pressure therapy should start immediately.  RECOMMENDATION: Auto CPAP with a setting from 8 through 20 cmH2O with 3 cm EPR, mask of choice and heated humidification.  This patient likely needs additional oxygen.  If the patient is established with a pulmonology specialist I would like him to continue his oxygen need work-up there. For our sleep lab to be able to provide oxygen and prescribe how many liters the patient should be given we would need to titrate him to oxygen while on CPAP. This requires an in- lab titration study.   INTERPRETING PHYSICIAN:   Larey Seat, MD   Medical Director of [facility] at [facility].

## 2021-12-01 NOTE — Assessment & Plan Note (Signed)
-  Ongoing vaping use -COPD appreciated on CTA -Outpatient f/u recommended

## 2021-12-01 NOTE — Assessment & Plan Note (Signed)
-  Patient presenting with subacute onset of L elbow pain, progressively worsening -Evaluation and imaging confirmed septic arthritis of the elbow after ortho performed joint aspiration -Cultures are pending but appear to be growing GPC (no report of chains vs. Clusters) -Will order GC/Chl -CRP and ESR are elevated.  WBC is often <15 with septic arthritis and is in this case. -He had tachycardia on presentation but low suspicion for sepsis at this time with normal lactate and BP. -Will observe on Med Surg. -Orthopedics is consulting -Empiric coverage with Rocephin and Vanc for now -MRSA PCR ordered

## 2021-12-01 NOTE — ED Notes (Signed)
[facility] here to get pt 

## 2021-12-02 ENCOUNTER — Other Ambulatory Visit (HOSPITAL_COMMUNITY): Payer: Self-pay

## 2021-12-02 DIAGNOSIS — I480 Paroxysmal atrial fibrillation: Secondary | ICD-10-CM | POA: Diagnosis not present

## 2021-12-02 DIAGNOSIS — I2510 Atherosclerotic heart disease of native coronary artery without angina pectoris: Secondary | ICD-10-CM | POA: Diagnosis not present

## 2021-12-02 DIAGNOSIS — M71122 Other infective bursitis, left elbow: Secondary | ICD-10-CM | POA: Diagnosis not present

## 2021-12-02 DIAGNOSIS — I10 Essential (primary) hypertension: Secondary | ICD-10-CM | POA: Diagnosis not present

## 2021-12-02 DIAGNOSIS — Z20822 Contact with and (suspected) exposure to covid-19: Secondary | ICD-10-CM | POA: Diagnosis not present

## 2021-12-02 LAB — BASIC METABOLIC PANEL
Anion gap: 10 (ref 5–15)
BUN: 11 mg/dL (ref 8–23)
CO2: 25 mmol/L (ref 22–32)
Calcium: 8.7 mg/dL — ABNORMAL LOW (ref 8.9–10.3)
Chloride: 96 mmol/L — ABNORMAL LOW (ref 98–111)
Creatinine, Ser: 1.11 mg/dL (ref 0.61–1.24)
GFR, Estimated: >60 mL/min (ref >60)
Glucose, Bld: 94 mg/dL (ref 70–99)
Potassium: 3.8 mmol/L (ref 3.5–5.1)
Sodium: 131 mmol/L — ABNORMAL LOW (ref 135–145)

## 2021-12-02 LAB — BODY FLUID CULTURE W GRAM STAIN

## 2021-12-02 LAB — CBC
HCT: 51.5 % (ref 39.0–52.0)
Hemoglobin: 16.7 g/dL (ref 13.0–17.0)
MCH: 28.7 pg (ref 26.0–34.0)
MCHC: 32.4 g/dL (ref 30.0–36.0)
MCV: 88.5 fL (ref 80.0–100.0)
Platelets: 246 10*3/uL (ref 150–400)
RBC: 5.82 MIL/uL — ABNORMAL HIGH (ref 4.22–5.81)
RDW: 14.6 % (ref 11.5–15.5)
WBC: 9.4 10*3/uL (ref 4.0–10.5)
nRBC: 0 % (ref 0.0–0.2)

## 2021-12-02 LAB — GC/CHLAMYDIA PROBE AMP (~~LOC~~) NOT AT ARMC
Chlamydia: NEGATIVE
Comment: NEGATIVE
Comment: NORMAL
Neisseria Gonorrhea: NEGATIVE

## 2021-12-02 LAB — HIV ANTIBODY (ROUTINE TESTING W REFLEX): HIV Screen 4th Generation wRfx: NONREACTIVE

## 2021-12-02 MED ORDER — FOLIC ACID 1 MG PO TABS: 1.0000 mg | ORAL_TABLET | Freq: Every day | ORAL | Status: AC

## 2021-12-02 MED ORDER — ATENOLOL 50 MG PO TABS
50.0000 mg | ORAL_TABLET | Freq: Every day | ORAL | Status: DC
  Administered 2021-12-02: 14:00:00 50 mg via ORAL
  Filled 2021-12-02: qty 1

## 2021-12-02 MED ORDER — CEFAZOLIN SODIUM-DEXTROSE 2-4 GM/100ML-% IV SOLN
2.0000 g | Freq: Three times a day (TID) | INTRAVENOUS | Status: DC
  Administered 2021-12-02: 10:00:00 2 g via INTRAVENOUS
  Filled 2021-12-02: qty 100

## 2021-12-02 MED ORDER — ACETAMINOPHEN 325 MG PO TABS: 650.0000 mg | ORAL_TABLET | Freq: Four times a day (QID) | ORAL | Status: AC | PRN

## 2021-12-02 MED ORDER — CEPHALEXIN 500 MG PO CAPS
1000.0000 mg | ORAL_CAPSULE | Freq: Three times a day (TID) | ORAL | 0 refills | Status: DC
  Filled 2021-12-02: qty 72, 12d supply, fill #0

## 2021-12-02 MED ORDER — ATENOLOL 50 MG PO TABS
50.0000 mg | ORAL_TABLET | Freq: Every day | ORAL | 0 refills | Status: DC
  Filled 2021-12-02: qty 30, 30d supply, fill #0

## 2021-12-02 MED ORDER — THIAMINE HCL 100 MG PO TABS: 100.0000 mg | ORAL_TABLET | Freq: Every day | ORAL | Status: DC

## 2021-12-02 NOTE — Progress Notes (Signed)
Pharmacy Antibiotic Note  Jeffrey Price is a 66 y.o. male admitted on 11/30/2021 with cellulitis.  Pharmacy has been consulted for vancomycin dosing.  Pt has been on vanc/ceftriaxone for elbow bursitis. Culture grew out MSSA. D/w Dr. Waldron [facility] and we will optimize to cefazolin before transtion to keflex.   Plan: Dc vanc/ceftriaxone Cefazolin 2g IV q8  Height: 6\' 2"  (188 cm) Weight: 127.9 kg (281 lb 15.5 oz) IBW/kg (Calculated) : 82.2  Temp (24hrs), Avg:97.9 F (36.6 C), Min:97.6 F (36.4 C), Max:98.4 F (36.9 C)  Recent Labs  Lab 11/26/21 1146 11/30/21 1202 12/01/21 1150 12/02/21 0140  WBC 10.4 9.9  --  9.4  CREATININE 1.19 1.02  --  1.11  LATICACIDVEN  --  1.1 1.2  --      Estimated Creatinine Clearance: 94.3 mL/min (by C-G formula based on SCr of 1.11 mg/dL).    No Known Allergies  Antimicrobials this admission: 1/23 vancomycin >> 1/24 1/23 ceftriaxone >> 1/24 1/25 cefazolin>>  Dose adjustments this admission: none  Microbiology results: 1/23 BCx: ngtd 1/23 Joint/elbow fluid: MSSA  Onnie Boer, PharmD, BCIDP, AAHIVP, CPP Infectious Disease Pharmacist 12/02/2021 9:28 AM

## 2021-12-02 NOTE — Plan of Care (Signed)
Problem: Clinical Measurements: Goal: Ability to avoid or minimize complications of infection will improve Outcome: Completed/Met   Problem: Skin Integrity: Goal: Skin integrity will improve Outcome: Completed/Met   Problem: Education: Goal: Knowledge of General Education information will improve Description: Including pain rating scale, medication(s)/side effects and non-pharmacologic comfort measures Outcome: Completed/Met   Problem: Health Behavior/Discharge Planning: Goal: Ability to manage health-related needs will improve Outcome: Completed/Met   Problem: Clinical Measurements: Goal: Ability to maintain clinical measurements within normal limits will improve Outcome: Completed/Met Goal: Will remain free from infection Outcome: Completed/Met Goal: Diagnostic test results will improve Outcome: Completed/Met Goal: Respiratory complications will improve Outcome: Completed/Met Goal: Cardiovascular complication will be avoided Outcome: Completed/Met   Problem: Activity: Goal: Risk for activity intolerance will decrease Outcome: Completed/Met   Problem: Nutrition: Goal: Adequate nutrition will be maintained Outcome: Completed/Met   Problem: Coping: Goal: Level of anxiety will decrease Outcome: Completed/Met   Problem: Elimination: Goal: Will not experience complications related to bowel motility Outcome: Completed/Met Goal: Will not experience complications related to urinary retention Outcome: Completed/Met   Problem: Pain Managment: Goal: General experience of comfort will improve Outcome: Completed/Met   Problem: Safety: Goal: Ability to remain free from injury will improve Outcome: Completed/Met   Problem: Skin Integrity: Goal: Risk for impaired skin integrity will decrease Outcome: Completed/Met   Problem: Clinical Measurements: Goal: Ability to avoid or minimize complications of infection will improve Outcome: Completed/Met   Problem: Skin  Integrity: Goal: Skin integrity will improve Outcome: Completed/Met   Problem: Fluid Volume: Goal: Hemodynamic stability will improve Outcome: Completed/Met   Problem: Clinical Measurements: Goal: Diagnostic test results will improve Outcome: Completed/Met Goal: Signs and symptoms of infection will decrease Outcome: Completed/Met   Problem: Respiratory: Goal: Ability to maintain adequate ventilation will improve Outcome: Completed/Met

## 2021-12-02 NOTE — Progress Notes (Signed)
0625 Patient had 2.53 second pause heart rate drop 43 .Patient in atrial fibrillation on monitor rate up 70 blood pressure 147/85.Patient denies chest pain or shortness of breath.Text paged Dr. Marlowe Sax.

## 2021-12-02 NOTE — Discharge Summary (Signed)
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 12/02/21 0759 12/02/21 1215  BP: 128/77 (!) 147/85 127/76 (!) 126/96  Pulse: 74 70  92  Resp: 16  18 19   Temp: 98.4 F (36.9 C)  97.6 F (36.4 C)   TempSrc: Oral  Oral   SpO2: 94%   90%  Weight:      Height:        General: Pt is alert, awake, not in acute distress Cardiovascular: RRR, S1/S2 +, no rubs, no gallops Respiratory: CTA bilaterally, no wheezing, no rhonchi Abdominal: Soft, NT, ND, bowel sounds + Extremities: no edema, no cyanosis in lower extremities, left elbow swelling and erythema much subsided, left arm swelling altogether much improved, range of motion much improved as well.    The results of significant diagnostics from this hospitalization (including imaging, microbiology, ancillary and laboratory) are listed below for reference.     Microbiology: Recent Results (from the past 240 hour(s))  Blood culture (routine x 2)     Status: None (Preliminary result)   Collection Time: 11/30/21  2:35 PM   Specimen:  BLOOD RIGHT HAND  Result Value Ref Range Status   Specimen Description   Final    BLOOD RIGHT HAND Performed at [facility], [address] [place], [place] [address]    Special Requests   Final    BOTTLES DRAWN AEROBIC AND ANAEROBIC Blood Culture adequate volume Performed at [facility], [address] [place], [place] [address]    Culture   Final    NO GROWTH 2 DAYS Performed at [facility], [place] [address] [place], [place] [address]    Report Status PENDING  Incomplete  Blood culture (routine x 2)     Status: None (Preliminary result)   Collection Time: 11/30/21  2:35 PM   Specimen: BLOOD RIGHT ARM  Result Value Ref Range Status   Specimen Description   Final    BLOOD RIGHT ARM Performed at [facility], [address] [place], [place] [address]    Special Requests   Final    BOTTLES DRAWN AEROBIC AND ANAEROBIC Blood Culture adequate volume Performed at [facility], [address] [place], [place] [address]    Culture   Final    NO GROWTH 2 DAYS Performed at [facility], [place] [address] [place], [place] [address]    Report Status PENDING  Incomplete  Resp Panel by RT-PCR (Flu A&B, Covid) Nasopharyngeal Swab     Status: None   Collection Time: 11/30/21  3:23 PM   Specimen: Nasopharyngeal Swab; Nasopharyngeal(NP) swabs in vial transport medium  Result Value Ref Range Status   SARS Coronavirus 2 by RT PCR NEGATIVE NEGATIVE Final    Comment: (NOTE) SARS-CoV-2 target nucleic acids are NOT DETECTED.  The SARS-CoV-2 RNA is generally detectable in upper respiratory specimens during the acute phase of infection. The lowest concentration of SARS-CoV-2 viral copies this assay can detect is 138 copies/mL. A negative result does not preclude SARS-Cov-2 infection and should not be used as the sole basis for treatment or other patient management decisions. A negative result may occur  with  improper specimen collection/handling, submission of specimen other than nasopharyngeal swab, presence of viral mutation(s) within the areas targeted by this assay, and inadequate number of viral copies(<138 copies/mL). A negative result must be combined with clinical observations, patient history, and epidemiological information. The expected result is Negative.  Fact Sheet for Patients:  EntrepreneurPulse.com.au  Fact Sheet for Healthcare Providers:  IncredibleEmployment.be  This test is no t yet approved or cleared by the [place] FDA and  has been authorized for detection and/or diagnosis of SARS-CoV-2 by FDA under an Emergency Use Authorization (EUA). This EUA will remain  in effect (meaning this test can be used) for the duration of the COVID-19 declaration under Section 564(b)(1) of the Act, 21 U.S.C.section 360bbb-3(b)(1), unless the authorization is terminated  or revoked sooner.       Influenza A by PCR NEGATIVE NEGATIVE Final   Influenza B by PCR NEGATIVE NEGATIVE Final    Comment: (NOTE) The Xpert Xpress SARS-CoV-2/FLU/RSV plus assay is intended as an aid in the diagnosis of influenza from Nasopharyngeal swab specimens and should not be used as a sole basis for treatment. Nasal washings and aspirates are unacceptable for Xpert Xpress SARS-CoV-2/FLU/RSV testing.  Fact Sheet for Patients: EntrepreneurPulse.com.au  Fact Sheet for Healthcare Providers: IncredibleEmployment.be  This test is not yet approved or cleared by the [place] FDA and has been authorized for detection and/or diagnosis of SARS-CoV-2 by FDA under an Emergency Use Authorization (EUA). This EUA will remain in effect (meaning this test can be used) for the duration of the COVID-19 declaration under Section 564(b)(1) of the Act, 21 U.S.C. section 360bbb-3(b)(1), unless the authorization is terminated  or revoked.  Performed at [facility], [address] [place], [place] [address]   Body fluid culture w Gram Stain     Status: None   Collection Time: 11/30/21  4:06 PM   Specimen: Synovium; Body Fluid  Result Value Ref Range Status   Specimen Description   Final    SYNOVIAL LEFT ELBOW Performed at [facility], [address], [place], [place] [address]    Special Requests   Final    NONE Performed at [facility], [place], [place] [address]    Gram Stain   Final    ABUNDANT WBC PRESENT,BOTH PMN AND  MONONUCLEAR FEW GRAM POSITIVE COCCI Performed at [facility], [place] [address] [place], [place] [address]    Culture RARE STAPHYLOCOCCUS AUREUS  Final   Report Status 12/02/2021 FINAL  Final   Organism ID, Bacteria STAPHYLOCOCCUS AUREUS  Final      Susceptibility   Staphylococcus aureus - MIC*    CIPROFLOXACIN <=0.5 SENSITIVE Sensitive     ERYTHROMYCIN <=0.25 SENSITIVE Sensitive     GENTAMICIN <=0.5 SENSITIVE Sensitive     OXACILLIN 0.5 SENSITIVE Sensitive     TETRACYCLINE <=1 SENSITIVE Sensitive     VANCOMYCIN <=0.5 SENSITIVE Sensitive     TRIMETH/SULFA <=10 SENSITIVE Sensitive     CLINDAMYCIN <=0.25 SENSITIVE Sensitive     RIFAMPIN <=0.5 SENSITIVE Sensitive     Inducible Clindamycin NEGATIVE Sensitive     * RARE STAPHYLOCOCCUS AUREUS  MRSA Next Gen by PCR, Nasal     Status: None   Collection Time: 12/01/21  6:35 PM   Specimen: Nasal Mucosa; Nasal Swab  Result Value Ref Range Status   MRSA by PCR Next Gen NOT DETECTED NOT DETECTED Final    Comment: (NOTE) The GeneXpert MRSA Assay (FDA approved for NASAL specimens only), is one component of a comprehensive MRSA colonization surveillance program. It is not intended to diagnose MRSA infection nor to guide or monitor treatment for MRSA infections. Test performance is not FDA approved in patients less than 38 years old. Performed at [facility], [place] [address] [place], [place] [address]      Labs: BNP (last 3 results) No results for input(s): BNP in the last 8760 hours. Basic Metabolic Panel: Recent Labs  Lab 11/26/21 1146 11/30/21 1202 12/02/21 0140  NA 140 134* 131*  K 4.7 3.9 3.8  CL 96 99 96*  CO2 20 25 25   GLUCOSE 129* 107* 94  BUN 15 14 11   CREATININE 1.19 1.02 1.11  CALCIUM 9.6 9.0 8.7*   Liver Function Tests: Recent Labs  Lab 11/30/21 1202  AST 19  ALT 13  ALKPHOS 75  BILITOT 1.0  PROT 7.4  ALBUMIN 4.0   No results for input(s): LIPASE, AMYLASE in the last 168 hours. No results for input(s): AMMONIA in the last 168 hours. CBC: Recent Labs  Lab 11/26/21 1146 11/30/21 1202 12/02/21 0140  WBC 10.4 9.9 9.4  NEUTROABS  --  6.6  --   HGB 18.3* 16.8 16.7  HCT 56.8* 51.8 51.5  MCV 88 86.6 88.5  PLT 290 273 246   Cardiac Enzymes: No results for input(s): CKTOTAL, CKMB, CKMBINDEX, TROPONINI in the last 168 hours. BNP: Invalid input(s): POCBNP CBG: Recent Labs  Lab 11/30/21 1629  GLUCAP 159*   D-Dimer No results for input(s): DDIMER in the last 72 hours. Hgb A1c No results for input(s): HGBA1C in the last 72 hours. Lipid Profile No results for input(s): CHOL, HDL, LDLCALC, TRIG, CHOLHDL, LDLDIRECT in the last 72 hours. Thyroid function studies No results for input(s): TSH, T4TOTAL, T3FREE, THYROIDAB in the last 72 hours.  Invalid input(s): FREET3 Anemia work up No results for input(s): VITAMINB12, FOLATE, FERRITIN, TIBC, IRON, RETICCTPCT in the last 72 hours. Urinalysis    Component Value Date/Time   COLORURINE COLORLESS (A) 11/30/2021 1202   APPEARANCEUR CLEAR 11/30/2021 1202   LABSPEC 1.007 11/30/2021 1202   PHURINE 6.5 11/30/2021 1202   GLUCOSEU >1,000 (A) 11/30/2021 1202   HGBUR NEGATIVE 11/30/2021 1202   BILIRUBINUR NEGATIVE 11/30/2021 1202   KETONESUR NEGATIVE 11/30/2021 1202   PROTEINUR NEGATIVE  11/30/2021 1202   NITRITE NEGATIVE 11/30/2021 1202    LEUKOCYTESUR NEGATIVE 11/30/2021 1202   Sepsis Labs Invalid input(s): PROCALCITONIN,  WBC,  LACTICIDVEN Microbiology Recent Results (from the past 240 hour(s))  Blood culture (routine x 2)     Status: None (Preliminary result)   Collection Time: 11/30/21  2:35 PM   Specimen: BLOOD RIGHT HAND  Result Value Ref Range Status   Specimen Description   Final    BLOOD RIGHT HAND Performed at [facility], [address], [place], [place] [address]    Special Requests   Final    BOTTLES DRAWN AEROBIC AND ANAEROBIC Blood Culture adequate volume Performed at [facility], [address] [place], [place] [address]    Culture   Final    NO GROWTH 2 DAYS Performed at [facility], [place] [address], [place], [place] [address]    Report Status PENDING  Incomplete  Blood culture (routine x 2)     Status: None (Preliminary result)   Collection Time: 11/30/21  2:35 PM   Specimen: BLOOD RIGHT ARM  Result Value Ref Range Status   Specimen Description   Final    BLOOD RIGHT ARM Performed at [facility], [address] [place], [place] [address]    Special Requests   Final    BOTTLES DRAWN AEROBIC AND ANAEROBIC Blood Culture adequate volume Performed at [facility], [address], [place], [place] [address]    Culture   Final    NO GROWTH 2 DAYS Performed at [facility], [place] [address], [place], [place] [address]    Report Status PENDING  Incomplete  Resp Panel by RT-PCR (Flu A&B, Covid) Nasopharyngeal Swab     Status: None   Collection Time: 11/30/21  3:23 PM   Specimen: Nasopharyngeal Swab; Nasopharyngeal(NP) swabs in vial transport medium  Result Value Ref Range Status   SARS Coronavirus 2 by RT PCR NEGATIVE NEGATIVE Final    Comment: (NOTE) SARS-CoV-2 target nucleic acids are NOT DETECTED.  The SARS-CoV-2 RNA is generally detectable in upper respiratory specimens during the acute phase of  infection. The lowest concentration of SARS-CoV-2 viral copies this assay can detect is 138 copies/mL. A negative result does not preclude SARS-Cov-2 infection and should not be used as the sole basis for treatment or other patient management decisions. A negative result may occur with  improper specimen collection/handling, submission of specimen other than nasopharyngeal swab, presence of viral mutation(s) within the areas targeted by this assay, and inadequate number of viral copies(<138 copies/mL). A negative result must be combined with clinical observations, patient history, and epidemiological information. The expected result is Negative.  Fact Sheet for Patients:  EntrepreneurPulse.com.au  Fact Sheet for Healthcare Providers:  IncredibleEmployment.be  This test is no t yet approved or cleared by the [place] FDA and  has been authorized for detection and/or diagnosis of SARS-CoV-2 by FDA under an Emergency Use Authorization (EUA). This EUA will remain  in effect (meaning this test can be used) for the duration of the COVID-19 declaration under Section 564(b)(1) of the Act, 21 U.S.C.section 360bbb-3(b)(1), unless the authorization is terminated  or revoked sooner.       Influenza A by PCR NEGATIVE NEGATIVE Final   Influenza B by PCR NEGATIVE NEGATIVE Final    Comment: (NOTE) The Xpert Xpress SARS-CoV-2/FLU/RSV plus assay is intended as an aid in the diagnosis of influenza from Nasopharyngeal swab specimens and should not be used as  a sole basis for treatment. Nasal washings and aspirates are unacceptable for Xpert Xpress SARS-CoV-2/FLU/RSV testing.  Fact Sheet for Patients: EntrepreneurPulse.com.au  Fact Sheet for Healthcare Providers: IncredibleEmployment.be  This test is not yet approved or cleared by the [place] FDA and has been authorized for detection and/or diagnosis of SARS-CoV-2  by FDA under an Emergency Use Authorization (EUA). This EUA will remain in effect (meaning this test can be used) for the duration of the COVID-19 declaration under Section 564(b)(1) of the Act, 21 U.S.C. section 360bbb-3(b)(1), unless the authorization is terminated or revoked.  Performed at [facility], [address] [place], [place] [address]   Body fluid culture w Gram Stain     Status: None   Collection Time: 11/30/21  4:06 PM   Specimen: Synovium; Body Fluid  Result Value Ref Range Status   Specimen Description   Final    SYNOVIAL LEFT ELBOW Performed at [facility], [address] [place], [place] [address]    Special Requests   Final    NONE Performed at [facility], [place], [place] [address]    Gram Stain   Final    ABUNDANT WBC PRESENT,BOTH PMN AND MONONUCLEAR FEW GRAM POSITIVE COCCI Performed at [facility], [place] [address], [place], [place] [address]    Culture RARE STAPHYLOCOCCUS AUREUS  Final   Report Status 12/02/2021 FINAL  Final   Organism ID, Bacteria STAPHYLOCOCCUS AUREUS  Final      Susceptibility   Staphylococcus aureus - MIC*    CIPROFLOXACIN <=0.5 SENSITIVE Sensitive     ERYTHROMYCIN <=0.25 SENSITIVE Sensitive     GENTAMICIN <=0.5 SENSITIVE Sensitive     OXACILLIN 0.5 SENSITIVE Sensitive     TETRACYCLINE <=1 SENSITIVE Sensitive     VANCOMYCIN <=0.5 SENSITIVE Sensitive     TRIMETH/SULFA <=10 SENSITIVE Sensitive     CLINDAMYCIN <=0.25 SENSITIVE Sensitive     RIFAMPIN <=0.5 SENSITIVE Sensitive     Inducible Clindamycin NEGATIVE Sensitive     * RARE STAPHYLOCOCCUS AUREUS  MRSA Next Gen by PCR, Nasal     Status: None   Collection Time: 12/01/21  6:35 PM   Specimen: Nasal Mucosa; Nasal Swab  Result Value Ref Range Status   MRSA by PCR Next Gen NOT DETECTED NOT DETECTED Final    Comment: (NOTE) The GeneXpert MRSA Assay (FDA approved for NASAL specimens only), is  one component of a comprehensive MRSA colonization surveillance program. It is not intended to diagnose MRSA infection nor to guide or monitor treatment for MRSA infections. Test performance is not FDA approved in patients less than 69 years old. Performed at [facility], [place] [address], [place], [place] [address]      Time coordinating discharge: Over 30 minutes  SIGNED:   Phillips Climes, MD  [facility] 12/02/2021, 3:08 PM Pager   If 7PM-7AM, please contact night-coverage [email or web address] Password [email or web address]

## 2021-12-02 NOTE — Progress Notes (Signed)
Pt on room air, stood at bedside and transferred to chair and back to bed. Maintained O2 sat >91%

## 2021-12-02 NOTE — Progress Notes (Signed)
Patient took CPAP off. Patient verbalized mask was making his face hurt. Oxygen placed at 3 liters per nasal canula.

## 2021-12-02 NOTE — Progress Notes (Signed)
Overnight event  Notified by RN that patient became bradycardic with heart rate in the 40s and had a 2.53-second pause on telemetry.  He remained asymptomatic at that time.  Currently back in A. fib with rate in the 70s.  Blood pressure 147/85.  -Stat EKG -Hold atenolol  -Consult cardiology in the morning.

## 2021-12-02 NOTE — Progress Notes (Signed)
PATIENT ID: Jeffrey Price  MRN: [id number]  DOB/AGE:  1956/06/11 / 66 y.o.        PROGRESS NOTE Subjective:   Patient is alert, oriented, no Nausea, no Vomiting, yes passing gas, no Bowel Movement. Taking PO well. Denies SOB, Chest or Calf Pain. Using Incentive Spirometer, PAS in place. Pt using arm to eat and for hygiene. Patient reports pain as  improving .   Objective: Vital signs in last 24 hours: Temp:  [97.6 F (36.4 C)-98.4 F (36.9 C)] 97.6 F (36.4 C) (01/25 0759) Pulse Rate:  [63-102] 70 (01/25 0639) Resp:  [16-21] 18 (01/25 0759) BP: (104-147)/(64-96) 127/76 (01/25 0759) SpO2:  [92 %-95 %] 94 % (01/25 0400)    Intake/Output from previous day: I/O last 3 completed shifts: In: 1448.7 [P.O.:120; I.V.:888.1; IV Piggyback:440.6] Out: 600 [Urine:600]   Intake/Output this shift: No intake/output data recorded.   LABORATORY DATA: Recent Labs    11/30/21 1202 11/30/21 1629 12/02/21 0140  WBC 9.9  --  9.4  HGB 16.8  --  16.7  HCT 51.8  --  51.5  PLT 273  --  246  NA 134*  --  131*  K 3.9  --  3.8  CL 99  --  96*  CO2 25  --  25  BUN 14  --  11  CREATININE 1.02  --  1.11  GLUCOSE 107*  --  94  GLUCAP  --  159*  --   CALCIUM 9.0  --  8.7*    Examination: Neurologically intact Neurovascular intact Sensation intact distally Intact pulses distally Cellulitis is improving in left elbow.  Pt now able to use elbow and has minimal pain }  Assessment:     Left elbow septic bursitis  Plan: Culture grew out MSSA.  Patient has been switched to cefazolin by hospitalist.  Agree with plan to transition to p.o. Keflex for discharge when medically safe.  Patient may follow-up with orthopedics in office in one week.     Joanell Rising 12/02/2021, 9:31 AM

## 2021-12-03 ENCOUNTER — Telehealth: Payer: Self-pay

## 2021-12-03 ENCOUNTER — Ambulatory Visit (HOSPITAL_COMMUNITY): Admission: RE | Admit: 2021-12-03 | Payer: PPO | Source: Home / Self Care | Admitting: Cardiology

## 2021-12-03 SURGERY — CARDIOVERSION
Anesthesia: General

## 2021-12-03 NOTE — Addendum Note (Signed)
Addended by: Agnes Lawrence on: 12/03/2021 01:55 PM   Modules accepted: Orders

## 2021-12-03 NOTE — Telephone Encounter (Signed)
Transition Care Management Follow-up Telephone Call Date of discharge and from where: [place] 12-02-21 Dx: septic bursitis of elbow How have you been since you were released from the hospital? Elbow hurting but ok  Any questions or concerns? No  Items Reviewed: Did the pt receive and understand the discharge instructions provided? Yes  Medications obtained and verified? yes Other? No  Any new allergies since your discharge? No  Dietary orders reviewed? Yes Do you have support at home? yes  Home Care and Equipment/Supplies: Were home health services ordered? no If so, what is the name of the agency? na  Has the agency set up a time to come to the patient's home? not applicable Were any new equipment or medical supplies ordered?  No What is the name of the medical supply agency? na Were you able to get the supplies/equipment? not applicable Do you have any questions related to the use of the equipment or supplies? No  Functional Questionnaire: (I = Independent and D = Dependent) ADLs: I  Bathing/Dressing- I  Meal Prep- I  Eating- I  Maintaining continence- I  Transferring/Ambulation- I  Managing Meds- I  Follow up appointments reviewed:  PCP Hospital f/u appt confirmed? Yes  Scheduled to see Dr Ethlyn Gallery on 12-07-21 @ [facility] f/u appt confirmed? No  . Are transportation arrangements needed? No  If their condition worsens, is the pt aware to call PCP or go to the Emergency Dept.? Yes Was the patient provided with contact information for the PCP's office or ED? Yes Was to pt encouraged to call back with questions or concerns? Yes

## 2021-12-04 ENCOUNTER — Telehealth (HOSPITAL_BASED_OUTPATIENT_CLINIC_OR_DEPARTMENT_OTHER): Payer: Self-pay | Admitting: Family

## 2021-12-04 ENCOUNTER — Other Ambulatory Visit (HOSPITAL_BASED_OUTPATIENT_CLINIC_OR_DEPARTMENT_OTHER): Payer: Self-pay | Admitting: Family

## 2021-12-04 ENCOUNTER — Encounter (HOSPITAL_BASED_OUTPATIENT_CLINIC_OR_DEPARTMENT_OTHER): Payer: Self-pay

## 2021-12-04 DIAGNOSIS — I480 Paroxysmal atrial fibrillation: Secondary | ICD-10-CM

## 2021-12-04 NOTE — Telephone Encounter (Signed)
TEE Cardioversion on February 14th  with Dr. Johnsie Cancel.  Please arrive at the [facility] (Main Entrance A) at [facility]: [address] [place], [place] [address] at 11:30 am. Procedure scheduled at 12:30am   DIET: Nothing to eat or drink after midnight except a sip of water with medications (see medication instructions below)  FYI: For your safety, and to allow [place] to monitor your vital signs accurately during the surgery/procedure we request that   if you have artificial nails, gel coating, SNS etc. Please have those removed prior to your surgery/procedure. Not having the nail coverings /polish removed may result in cancellation or delay of your surgery/procedure.   Medication Instructions: Continue your anticoagulant: Xarelto You will need to continue your anticoagulant after your procedure until you  are told by your  Provider that it is safe to stop   Labs: Come to: [facility] 3rd floor or any [employer] [facility] location! Please have labs drawn on 12/15/21    You must have a responsible person to drive you home and stay in the waiting area during your procedure. Failure to do so could result in cancellation.  Bring your insurance cards.  *Special Note: Every effort is made to have your procedure done on time. Occasionally there are emergencies that occur at the hospital that may cause delays. Please be patient if a delay does occur.      Called patient and informed him of his new TEE/ Cardioversion date and time. Also informed him of his new date for lab work. Will print instructions with lab slips and mail to patient's home! Laurann [place], NP to call this afternoon and answer any follow up questions!

## 2021-12-04 NOTE — Telephone Encounter (Signed)
Called patient to discuss TEE+DCCV. Verified patient by name and DOB. Shared with me that he is starting CPAP soon.  Shared Decision Making/Informed Consent The risks [stroke, cardiac arrhythmias rarely resulting in the need for a temporary or permanent pacemaker, skin irritation or burns, esophageal damage, perforation (1:10,000 risk), bleeding, pharyngeal hematoma as well as other potential complications associated with conscious sedation including aspiration, arrhythmia, respiratory failure and death], benefits (treatment guidance, restoration of normal sinus rhythm, diagnostic support) and alternatives of a transesophageal echocardiogram guided cardioversion were discussed in detail with Jeffrey Price and he is willing to proceed.   Loel Dubonnet, NP

## 2021-12-04 NOTE — Addendum Note (Signed)
Addended by: Gerald Stabs on: 12/04/2021 01:54 PM   Modules accepted: Orders

## 2021-12-04 NOTE — Telephone Encounter (Signed)
Patient was admitted 11/30/2021 for noncardiac issue.  Unfortunately he missed a dose of his Xarelto.  His cardioversion 12/03/2021 had to be canceled.  Per Dr. Jacalyn Lefevre request to reschedule TEE with DCCV.  We will asked nursing team to assist in scheduling.  Loel Dubonnet, NP

## 2021-12-05 LAB — CULTURE, BLOOD (ROUTINE X 2)
Culture: NO GROWTH
Culture: NO GROWTH
Special Requests: ADEQUATE
Special Requests: ADEQUATE

## 2021-12-07 ENCOUNTER — Encounter: Payer: Self-pay | Admitting: Family Medicine

## 2021-12-07 ENCOUNTER — Ambulatory Visit (INDEPENDENT_AMBULATORY_CARE_PROVIDER_SITE_OTHER): Payer: PPO | Admitting: Family Medicine

## 2021-12-07 VITALS — BP 112/60 | HR 73 | Temp 97.9°F | Ht 74.0 in | Wt 279.5 lb

## 2021-12-07 DIAGNOSIS — R0602 Shortness of breath: Secondary | ICD-10-CM

## 2021-12-07 DIAGNOSIS — M71122 Other infective bursitis, left elbow: Secondary | ICD-10-CM

## 2021-12-07 DIAGNOSIS — G4733 Obstructive sleep apnea (adult) (pediatric): Secondary | ICD-10-CM

## 2021-12-07 DIAGNOSIS — E1140 Type 2 diabetes mellitus with diabetic neuropathy, unspecified: Secondary | ICD-10-CM | POA: Diagnosis not present

## 2021-12-07 DIAGNOSIS — I4891 Unspecified atrial fibrillation: Secondary | ICD-10-CM

## 2021-12-07 LAB — COMPREHENSIVE METABOLIC PANEL
ALT: 24 U/L (ref 0–53)
AST: 25 U/L (ref 0–37)
Albumin: 4.1 g/dL (ref 3.5–5.2)
Alkaline Phosphatase: 85 U/L (ref 39–117)
BUN: 12 mg/dL (ref 6–23)
CO2: 28 mEq/L (ref 19–32)
Calcium: 9.3 mg/dL (ref 8.4–10.5)
Chloride: 98 mEq/L (ref 96–112)
Creatinine, Ser: 1.05 mg/dL (ref 0.40–1.50)
GFR: 74.73 mL/min (ref >60.00)
Glucose, Bld: 103 mg/dL — ABNORMAL HIGH (ref 70–99)
Potassium: 4.3 mEq/L (ref 3.5–5.1)
Sodium: 136 mEq/L (ref 135–145)
Total Bilirubin: 0.6 mg/dL (ref 0.2–1.2)
Total Protein: 7.8 g/dL (ref 6.0–8.3)

## 2021-12-07 LAB — CBC WITH DIFFERENTIAL/PLATELET
Basophils Absolute: 0 10*3/uL (ref 0.0–0.1)
Basophils Relative: 0.6 % (ref 0.0–3.0)
Eosinophils Absolute: 0.2 10*3/uL (ref 0.0–0.7)
Eosinophils Relative: 2.7 % (ref 0.0–5.0)
HCT: 53.2 % — ABNORMAL HIGH (ref 39.0–52.0)
Hemoglobin: 17.1 g/dL — ABNORMAL HIGH (ref 13.0–17.0)
Lymphocytes Relative: 24.6 % (ref 12.0–46.0)
Lymphs Abs: 1.8 10*3/uL (ref 0.7–4.0)
MCHC: 32.1 g/dL (ref 30.0–36.0)
MCV: 87.5 fl (ref 78.0–100.0)
Monocytes Absolute: 0.7 10*3/uL (ref 0.1–1.0)
Monocytes Relative: 8.8 % (ref 3.0–12.0)
Neutro Abs: 4.7 10*3/uL (ref 1.4–7.7)
Neutrophils Relative %: 63.3 % (ref 43.0–77.0)
Platelets: 320 10*3/uL (ref 150.0–400.0)
RBC: 6.07 Mil/uL — ABNORMAL HIGH (ref 4.22–5.81)
RDW: 14.7 % (ref 11.5–15.5)
WBC: 7.4 10*3/uL (ref 4.0–10.5)

## 2021-12-07 NOTE — Progress Notes (Signed)
Jeffrey Price DOB: 06-12-1956 Encounter date: 12/07/2021  This is a 66 y.o. male who presents with Chief Complaint  Patient presents with   Hospitalization Follow-up    History of present illness: Was admitted 11/30/21 and discharged 12/02/21. Septic bursitis elbow - transitioned to keflex 1gram TID. States that arm is store but it is better than it was. Has a sleep which helps with compression. Swelling in hand, arm are improved. No fevers since coming home.   A fib: was taken off xarelto in case needing surgery. Cardioversion rescheduled for 2/14.  Sleep apnea testing showed significant hypoxia, severe sleep apnea; they are going to repeat monitoring with oxygen.    No Known Allergies Current Meds  Medication Sig   acetaminophen (TYLENOL) 325 MG tablet Take 2 tablets (650 mg total) by mouth every 6 (six) hours as needed for mild pain (or Fever >/= 101).   atenolol (TENORMIN) 50 MG tablet Take 1 tablet (50 mg total) by mouth daily.   atorvastatin (LIPITOR) 20 MG tablet TAKE 1 TABLET BY MOUTH ONCE DAILY   cephALEXin (KEFLEX) 500 MG capsule Take 2 capsules (1,000 mg total) by mouth 3 (three) times daily.   cetirizine (ZYRTEC) 10 MG tablet Take 10 mg by mouth daily as needed for allergies.   clotrimazole (CLOTRIMAZOLE AF) 1 % cream Apply topically twice daily as needed (rash).   Cyanocobalamin (B-12 SL) Place 1 tablet under the tongue daily.   cyclobenzaprine (FLEXERIL) 10 MG tablet TAKE 1 TABLET BY MOUTH 3 TIMES DAILY AS NEEDED FOR MUSCLE SPASMS (Patient taking differently: Take 10 mg by mouth at bedtime as needed for muscle spasms.)   empagliflozin (JARDIANCE) 10 MG TABS tablet Take 1 tablet (10 mg total) by mouth daily before breakfast.   folic acid (FOLVITE) 1 MG tablet Take 1 tablet (1 mg total) by mouth daily.   furosemide (LASIX) 40 MG tablet Take 2 tablets (80 mg total) by mouth daily.   meloxicam (MOBIC) 15 MG tablet TAKE 1 TABLET BY MOUTH ONCE DAILY AS NEEDED FOR INFLAMMATION    methimazole (TAPAZOLE) 5 MG tablet Take 1 tablet (5 mg total) by mouth every morning.   methocarbamol (ROBAXIN) 500 MG tablet Take 500 mg by mouth every 6 (six) hours as needed for muscle spasms.   NEEDLE, DISP, 18 G (BD HYPODERMIC NEEDLE) 18G X 1" MISC Use as directed with Testosterone   NEEDLE, DISP, 22 G (BD ECLIPSE NEEDLE) 22G X 1-1/2" MISC Use as directed   pantoprazole (PROTONIX) 40 MG tablet TAKE 1 TABLET BY MOUTH DAILY * PT NEEDS APPT   pregabalin (LYRICA) 200 MG capsule TAKE 1 CAPSULE BY MOUTH 2 TIMES DAILY * DO NOT TAKE GABAPENTIN WHILE TAKING THIS MED   rivaroxaban (XARELTO) 20 MG TABS tablet Take 1 tablet by mouth daily with supper.   sildenafil (VIAGRA) 100 MG tablet Take 1/2-1 tablet by mouth daily as needed for erectile dysfunction.   Syringe, Disposable, 3 ML MISC Use as directed   SYRINGE-NEEDLE, DISP, 3 ML (B-D 3CC LUER-LOK SYR 22GX1") 22G X 1" 3 ML MISC Use as directed with Testosterone   testosterone cypionate (DEPOTESTOSTERONE CYPIONATE) 200 MG/ML injection INJECT 1 ML INTO THE MUSCLE EVERY 14 DAYS   tretinoin (RETIN-A) 0.05 % cream Apply 1 application topically at bedtime.   triamcinolone cream (KENALOG) 0.1 % Apply to affected area(s) twice daily for 14 days (Patient taking differently: 1 application daily as needed (rash).)    Review of Systems  Constitutional:  Positive for fatigue (feeling  better; just has been througha lot recently). Negative for chills and fever.  HENT:  Negative for congestion.   Respiratory:  Positive for shortness of breath. Negative for cough, chest tightness and wheezing.   Cardiovascular:  Negative for chest pain, palpitations and leg swelling.  Musculoskeletal:  Positive for arthralgias (left elbow; improving).  Skin:  Positive for color change (improved).  Psychiatric/Behavioral:  Positive for sleep disturbance.    Objective:  BP 112/60 (BP Location: Right Arm, Patient Position: Sitting, Cuff Size: Large)    Pulse 73    Temp 97.9 F  (36.6 C) (Oral)    Ht 6\' 2"  (1.88 m)    Wt 279 lb 8 oz (126.8 kg)    SpO2 93%    BMI 35.89 kg/m   Weight: 279 lb 8 oz (126.8 kg)   BP Readings from Last 3 Encounters:  12/07/21 112/60  12/02/21 (!) 126/96  11/20/21 124/80   Wt Readings from Last 3 Encounters:  12/07/21 279 lb 8 oz (126.8 kg)  11/30/21 281 lb 15.5 oz (127.9 kg)  11/20/21 282 lb (127.9 kg)    Physical Exam Constitutional:      General: He is not in acute distress.    Appearance: He is well-developed.  Cardiovascular:     Rate and Rhythm: Normal rate and regular rhythm.     Heart sounds: Normal heart sounds. No murmur heard.   No friction rub.  Pulmonary:     Effort: Pulmonary effort is normal. No respiratory distress.     Breath sounds: Normal breath sounds. No wheezing or rales.  Musculoskeletal:     Right lower leg: No edema.     Left lower leg: No edema.     Comments: Nearly normal range of motion of left elbow.  He is limited with some flexion at the elbow due to residual edema.  Skin:    Comments: No warmth or erythema to skin over elbow.  No tenderness with touch over the elbow (gentle)  Neurological:     Mental Status: He is alert and oriented to person, place, and time.  Psychiatric:        Behavior: Behavior normal.       Assessment/Plan   1. Septic olecranon bursitis of left elbow Patient feels that he is continuing to have significant improvement of the elbow.  States overall he is "200% better".  Still with some edema.  We discussed keeping arm elevated when able.  Continue to avoid any pressure on the elbow.  Complete antibiotics. - Comprehensive metabolic panel; Future - CBC with Differential/Platelet; Future - Comprehensive metabolic panel - CBC with Differential/Platelet  2. Atrial fibrillation, unspecified type [facility]) Patient has follow-up scheduled with cardiology.  He states they are looking into patient assistance for him for the Xarelto since this was recommended to continue.  He  has switched insurance plans and is having issues with medication coverage overall.  3. SOB (shortness of breath) on exertion Breathing is stable, but he still feels somewhat short of breath.  He is hoping that cardioversion will be helpful from the standpoint.  He has stopped vaping completely and is working on [employer].  Encouraged him to keep up with the good efforts.  4. Severe obstructive sleep apnea-hypopnea syndrome Is following up for further evaluation and treatment of his severe sleep apnea.  They are working on adjusting oxygen levels and titrating to meet his needs.  5. Controlled type 2 diabetes mellitus with diabetic neuropathy, without long-term current use of  insulin (Bulger) Blood sugar has been well controlled.  Continue current medications.  Would like for him to continue the Jardiance since he has done well on this.  Going to give him the number for patient assistance to see if he qualifies.  If not, we can rethink treatment plan.  He is uncertain if the Lyrica is helping him.  He also does have a B12 deficiency which we are in the process of correcting.  We discussed cutting Lyrica back to 1 capsule daily and seeing if he notices any worsening of neuropathic pain.  If not, we can wean him off of medication and try something less expensive like gabapentin.  If he does note worsening of neuropathic pain with decreasing Lyrica, we will have to work to get him patient assistance for his Lyrica as well since this is quite expensive for him.  Return for pending patient update.     Micheline Rough, MD

## 2021-12-11 DIAGNOSIS — R400 Somnolence: Secondary | ICD-10-CM | POA: Diagnosis not present

## 2021-12-11 DIAGNOSIS — G4733 Obstructive sleep apnea (adult) (pediatric): Secondary | ICD-10-CM | POA: Diagnosis not present

## 2021-12-14 ENCOUNTER — Encounter (HOSPITAL_COMMUNITY): Payer: Self-pay | Admitting: Cardiovascular Disease

## 2021-12-14 NOTE — Progress Notes (Signed)
Attempted to obtain medical history via telephone, unable to reach at this time. I left a voicemail to return pre surgical testing department's phone call.  

## 2021-12-15 DIAGNOSIS — I480 Paroxysmal atrial fibrillation: Secondary | ICD-10-CM | POA: Diagnosis not present

## 2021-12-16 LAB — CBC
Hematocrit: 56.9 % — ABNORMAL HIGH (ref 37.5–51.0)
Hemoglobin: 18.6 g/dL — ABNORMAL HIGH (ref 13.0–17.7)
MCH: 27.8 pg (ref 26.6–33.0)
MCHC: 32.7 g/dL (ref 31.5–35.7)
MCV: 85 fL (ref 79–97)
Platelets: 309 x10E3/uL (ref 150–450)
RBC: 6.68 x10E6/uL — ABNORMAL HIGH (ref 4.14–5.80)
RDW: 14.7 % (ref 11.6–15.4)
WBC: 8.1 x10E3/uL (ref 3.4–10.8)

## 2021-12-16 LAB — BASIC METABOLIC PANEL WITH GFR
BUN/Creatinine Ratio: 16 (ref 10–24)
BUN: 17 mg/dL (ref 8–27)
CO2: 21 mmol/L (ref 20–29)
Calcium: 9.6 mg/dL (ref 8.6–10.2)
Chloride: 98 mmol/L (ref 96–106)
Creatinine, Ser: 1.07 mg/dL (ref 0.76–1.27)
Glucose: 108 mg/dL — ABNORMAL HIGH (ref 70–99)
Potassium: 4.5 mmol/L (ref 3.5–5.2)
Sodium: 136 mmol/L (ref 134–144)
eGFR: 77 mL/min/1.73

## 2021-12-17 ENCOUNTER — Ambulatory Visit (HOSPITAL_BASED_OUTPATIENT_CLINIC_OR_DEPARTMENT_OTHER): Payer: Self-pay | Admitting: Family

## 2021-12-22 ENCOUNTER — Ambulatory Visit (HOSPITAL_BASED_OUTPATIENT_CLINIC_OR_DEPARTMENT_OTHER)
Admission: RE | Admit: 2021-12-22 | Discharge: 2021-12-22 | Disposition: A | Discharge status: Home / Self Care | Payer: PPO | Source: Home / Self Care | Attending: Cardiovascular Disease | Admitting: Cardiovascular Disease

## 2021-12-22 ENCOUNTER — Encounter (HOSPITAL_COMMUNITY): Payer: Self-pay | Admitting: Cardiovascular Disease

## 2021-12-22 ENCOUNTER — Other Ambulatory Visit (HOSPITAL_COMMUNITY): Payer: Self-pay | Admitting: Cardiovascular Disease

## 2021-12-22 ENCOUNTER — Ambulatory Visit (HOSPITAL_COMMUNITY): Payer: PPO | Admitting: Anesthesiology

## 2021-12-22 ENCOUNTER — Other Ambulatory Visit: Payer: Self-pay

## 2021-12-22 ENCOUNTER — Ambulatory Visit (HOSPITAL_COMMUNITY)
Admission: RE | Admit: 2021-12-22 | Discharge: 2021-12-22 | Disposition: A | Discharge status: Home / Self Care | Payer: PPO | Attending: Cardiovascular Disease | Admitting: Cardiovascular Disease

## 2021-12-22 ENCOUNTER — Ambulatory Visit (HOSPITAL_BASED_OUTPATIENT_CLINIC_OR_DEPARTMENT_OTHER): Payer: PPO | Admitting: Anesthesiology

## 2021-12-22 ENCOUNTER — Encounter (HOSPITAL_COMMUNITY)
Admission: RE | Disposition: A | Discharge status: Home / Self Care | Payer: PPO | Source: Home / Self Care | Attending: Cardiovascular Disease

## 2021-12-22 DIAGNOSIS — Z87891 Personal history of nicotine dependence: Secondary | ICD-10-CM | POA: Insufficient documentation

## 2021-12-22 DIAGNOSIS — E039 Hypothyroidism, unspecified: Secondary | ICD-10-CM | POA: Diagnosis not present

## 2021-12-22 DIAGNOSIS — I4891 Unspecified atrial fibrillation: Secondary | ICD-10-CM

## 2021-12-22 DIAGNOSIS — I340 Nonrheumatic mitral (valve) insufficiency: Secondary | ICD-10-CM | POA: Diagnosis not present

## 2021-12-22 DIAGNOSIS — E119 Type 2 diabetes mellitus without complications: Secondary | ICD-10-CM | POA: Diagnosis not present

## 2021-12-22 DIAGNOSIS — G629 Polyneuropathy, unspecified: Secondary | ICD-10-CM | POA: Insufficient documentation

## 2021-12-22 DIAGNOSIS — I081 Rheumatic disorders of both mitral and tricuspid valves: Secondary | ICD-10-CM | POA: Diagnosis not present

## 2021-12-22 DIAGNOSIS — I10 Essential (primary) hypertension: Secondary | ICD-10-CM

## 2021-12-22 DIAGNOSIS — E785 Hyperlipidemia, unspecified: Secondary | ICD-10-CM | POA: Insufficient documentation

## 2021-12-22 DIAGNOSIS — E0590 Thyrotoxicosis, unspecified without thyrotoxic crisis or storm: Secondary | ICD-10-CM | POA: Diagnosis not present

## 2021-12-22 DIAGNOSIS — G4730 Sleep apnea, unspecified: Secondary | ICD-10-CM

## 2021-12-22 DIAGNOSIS — I480 Paroxysmal atrial fibrillation: Secondary | ICD-10-CM

## 2021-12-22 DIAGNOSIS — I119 Hypertensive heart disease without heart failure: Secondary | ICD-10-CM | POA: Diagnosis not present

## 2021-12-22 DIAGNOSIS — Z7901 Long term (current) use of anticoagulants: Secondary | ICD-10-CM | POA: Diagnosis not present

## 2021-12-22 HISTORY — PX: CARDIOVERSION: SHX1299

## 2021-12-22 HISTORY — PX: TEE WITHOUT CARDIOVERSION: SHX5443

## 2021-12-22 LAB — GLUCOSE, CAPILLARY: Glucose-Capillary: 114 mg/dL — ABNORMAL HIGH (ref 70–99)

## 2021-12-22 SURGERY — ECHOCARDIOGRAM, TRANSESOPHAGEAL
Anesthesia: Monitor Anesthesia Care

## 2021-12-22 MED ORDER — PROPOFOL 10 MG/ML IV BOLUS
INTRAVENOUS | Status: DC | PRN
  Administered 2021-12-22: 20 mg via INTRAVENOUS
  Administered 2021-12-22: 30 mg via INTRAVENOUS

## 2021-12-22 MED ORDER — LIDOCAINE 2% (20 MG/ML) 5 ML SYRINGE
INTRAMUSCULAR | Status: DC | PRN
  Administered 2021-12-22: 100 mg via INTRAVENOUS

## 2021-12-22 MED ORDER — PROPOFOL 500 MG/50ML IV EMUL
INTRAVENOUS | Status: DC | PRN
  Administered 2021-12-22: 100 ug/kg/min via INTRAVENOUS

## 2021-12-22 MED ORDER — SODIUM CHLORIDE 0.9 % IV SOLN: INTRAVENOUS | Status: DC | PRN
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
## 2021-12-22 NOTE — Transfer of Care (Signed)
Immediate Anesthesia Transfer of Care Note  Patient: Jeffrey Price  Procedure(s) Performed: TRANSESOPHAGEAL ECHOCARDIOGRAM (TEE) CARDIOVERSION  Patient Location: Endoscopy Unit  Anesthesia Type:General  Level of Consciousness: drowsy and patient cooperative  Airway & Oxygen Therapy: Patient Spontanous Breathing and Patient connected to nasal cannula oxygen  Post-op Assessment: Report given to RN and Post -op Vital signs reviewed and stable  Post vital signs: Reviewed and stable  Last Vitals:  Vitals Value Taken Time  BP 106/60 12/22/21 1321  Temp 36.7 C 12/22/21 1321  Pulse 76 12/22/21 1322  Resp 19 12/22/21 1322  SpO2 95 % 12/22/21 1322  Vitals shown include unvalidated device data.  Last Pain:  Vitals:   12/22/21 1321  TempSrc: Temporal  PainSc: 0-No pain         Complications: No notable events documented.

## 2021-12-22 NOTE — CV Procedure (Signed)
TEE/DCC: On eliquis  Anesthesia;  Propofol Dr Tobias Alexander  Mild LAE no thrombus in LAA No ASD/PFO EF 55-60%  Mild MR Mild TR Normal AV No effusion  DCC x 1 150J biphasic Converted to NSR rate 74 bpm No immediate neurologic sequelae  Jenkins Rouge MD [facility]

## 2021-12-22 NOTE — Discharge Instructions (Signed)
TEE  YOU HAD AN CARDIAC PROCEDURE TODAY: Refer to the procedure report and other information in the discharge instructions given to you for any specific questions about what was found during the examination. If this information does not answer your questions, please call [facility] office at [phone] to clarify.   DIET: Your first meal following the procedure should be a light meal and then it is ok to progress to your normal diet. A half-sandwich or bowl of soup is an example of a good first meal. Heavy or fried foods are harder to digest and may make you feel nauseous or bloated. Drink plenty of fluids but you should avoid alcoholic beverages for 24 hours. If you had a esophageal dilation, please see attached instructions for diet.   ACTIVITY: Your care partner should take you home directly after the procedure. You should plan to take it easy, moving slowly for the rest of the day. You can resume normal activity the day after the procedure however YOU SHOULD NOT DRIVE, use power tools, machinery or perform tasks that involve climbing or major physical exertion for 24 hours (because of the sedation medicines used during the test).   SYMPTOMS TO REPORT IMMEDIATELY: A cardiologist can be reached at any hour. Please call [phone] for any of the following symptoms:  Vomiting of blood or coffee ground material  New, significant abdominal pain  New, significant chest pain or pain under the shoulder blades  Painful or persistently difficult swallowing  New shortness of breath  Black, tarry-looking or red, bloody stools  FOLLOW UP:  Please also call with any specific questions about appointments or follow up tests.  Electrical Cardioversion  Electrical cardioversion is the delivery of a jolt of electricity to restore a normal rhythm to the heart. A rhythm that is too fast or is not regular keeps the heart from pumping well. In this procedure, sticky patches or metal paddles are placed on  the chest to deliver electricity to the heart from a device.  If this information does not answer your questions, please call [facility] office at [phone] to clarify.   Follow these instructions at home: You may have some redness on the skin where the shocks were given.  You may apply over-the-counter hydrocortisone cream or aloe vera to alleviate skin irritation. YOU SHOULD NOT DRIVE, use power tools, machinery or perform tasks that involve climbing or major physical exertion for 24 hours (because of the sedation medicines used during the test).  Take over-the-counter and prescription medicines only as told by your health care provider. Ask your health care provider how to check your pulse. Check it often. Rest for 48 hours after the procedure or as told by your health care provider. Avoid or limit your caffeine use as told by your health care provider. Keep all follow-up visits as told by your health care provider. This is important.  FOLLOW UP:  Please also call with any specific questions about appointments or follow up tests.   

## 2021-12-22 NOTE — Anesthesia Postprocedure Evaluation (Signed)
Anesthesia Post Note  Patient: Jeffrey Price  Procedure(s) Performed: TRANSESOPHAGEAL ECHOCARDIOGRAM (TEE) CARDIOVERSION     Patient location during evaluation: PACU Anesthesia Type: MAC Level of consciousness: awake and alert Pain management: pain level controlled Vital Signs Assessment: post-procedure vital signs reviewed and stable Respiratory status: spontaneous breathing and respiratory function stable Cardiovascular status: stable Postop Assessment: no apparent nausea or vomiting Anesthetic complications: no   No notable events documented.  Last Vitals:  Vitals:   12/22/21 1330 12/22/21 1340  BP: (!) 109/53 107/62  Pulse: 76 74  Resp: 15 13  Temp:    SpO2: 94% 96%    Last Pain:  Vitals:   12/22/21 1340  TempSrc:   PainSc: 0-No pain                 Zainah Steven,Mattison DANIEL

## 2021-12-22 NOTE — Anesthesia Preprocedure Evaluation (Signed)
Anesthesia Evaluation   Patient awake    Reviewed: Allergy & Precautions, NPO status , Patient's Chart, lab work & pertinent test results  History of Anesthesia Complications (+) PROLONGED EMERGENCE and history of anesthetic complications  Airway Mallampati: II  TM Distance: >3 FB Neck ROM: Limited    Dental  (+) Teeth Intact, Dental Advisory Given   Pulmonary sleep apnea , former smoker,    Pulmonary exam normal breath sounds clear to auscultation       Cardiovascular hypertension, Pt. on home beta blockers Normal cardiovascular exam Rhythm:Regular Rate:Normal     Neuro/Psych  Neuromuscular disease negative psych ROS   GI/Hepatic Neg liver ROS, GERD  Medicated,  Endo/Other  diabetes, Type 2Hyperthyroidism Obesity   Renal/GU negative Renal ROS     Musculoskeletal  (+) Arthritis ,   Abdominal   Peds  Hematology negative hematology ROS (+)   Anesthesia Other Findings Day of surgery medications reviewed with the patient.  Reproductive/Obstetrics                             Anesthesia Physical  Anesthesia Plan  ASA: 3  Anesthesia Plan: MAC   Post-op Pain Management: Minimal or no pain anticipated   Induction: Intravenous  PONV Risk Score and Plan: 2 and Midazolam, Dexamethasone and Ondansetron  Airway Management Planned: Natural Airway and Simple Face Mask  Additional Equipment:   Intra-op Plan:   Post-operative Plan: Extubation in OR  Informed Consent: I have reviewed the patients History and Physical, chart, labs and discussed the procedure including the risks, benefits and alternatives for the proposed anesthesia with the patient or authorized representative who has indicated his/her understanding and acceptance.     Dental advisory given  Plan Discussed with: CRNA and Anesthesiologist  Anesthesia Plan Comments: (PAT note written 06/16/2021 by Myra Gianotti, PA-C. )         Anesthesia Quick Evaluation

## 2021-12-22 NOTE — Progress Notes (Signed)
°  Echocardiogram Echocardiogram Transesophageal has been performed.  Jeffrey Price 12/22/2021, 1:41 PM

## 2021-12-23 ENCOUNTER — Encounter (HOSPITAL_COMMUNITY): Payer: Self-pay | Admitting: Cardiovascular Disease

## 2021-12-29 ENCOUNTER — Other Ambulatory Visit (HOSPITAL_COMMUNITY): Payer: Self-pay

## 2022-01-04 ENCOUNTER — Institutional Professional Consult (permissible substitution): Payer: No Typology Code available for payment source | Admitting: Neurology

## 2022-01-06 ENCOUNTER — Ambulatory Visit (INDEPENDENT_AMBULATORY_CARE_PROVIDER_SITE_OTHER): Payer: PPO | Admitting: Family Medicine

## 2022-01-06 ENCOUNTER — Other Ambulatory Visit (HOSPITAL_COMMUNITY): Payer: Self-pay

## 2022-01-06 ENCOUNTER — Encounter: Payer: Self-pay | Admitting: Family Medicine

## 2022-01-06 VITALS — BP 116/74 | HR 54 | Temp 97.6°F | Ht 74.0 in | Wt 277.9 lb

## 2022-01-06 DIAGNOSIS — I499 Cardiac arrhythmia, unspecified: Secondary | ICD-10-CM

## 2022-01-06 DIAGNOSIS — E538 Deficiency of other specified B group vitamins: Secondary | ICD-10-CM

## 2022-01-06 DIAGNOSIS — I10 Essential (primary) hypertension: Secondary | ICD-10-CM

## 2022-01-06 DIAGNOSIS — E1140 Type 2 diabetes mellitus with diabetic neuropathy, unspecified: Secondary | ICD-10-CM | POA: Diagnosis not present

## 2022-01-06 DIAGNOSIS — R7989 Other specified abnormal findings of blood chemistry: Secondary | ICD-10-CM | POA: Diagnosis not present

## 2022-01-06 DIAGNOSIS — G6289 Other specified polyneuropathies: Secondary | ICD-10-CM

## 2022-01-06 LAB — POCT GLYCOSYLATED HEMOGLOBIN (HGB A1C): Hemoglobin A1C: 6.3 % — AB (ref 4.0–5.6)

## 2022-01-06 LAB — FOLATE: Folate: 15.3 ng/mL (ref >5.9)

## 2022-01-06 LAB — VITAMIN B12: Vitamin B-12: 643 pg/mL (ref 211–911)

## 2022-01-06 MED ORDER — GABAPENTIN 300 MG PO CAPS
ORAL_CAPSULE | ORAL | 1 refills | Status: DC
  Filled 2022-01-06: qty 270, 90d supply, fill #0

## 2022-01-06 MED ORDER — METFORMIN HCL 500 MG PO TABS
500.0000 mg | ORAL_TABLET | Freq: Two times a day (BID) | ORAL | 1 refills | Status: DC
  Filled 2022-01-06: qty 180, 90d supply, fill #0
  Filled 2022-05-10: qty 180, 90d supply, fill #1

## 2022-01-06 NOTE — Patient Instructions (Addendum)
If protonix is too expensive, then you can get omeprazole cash pay for low cost ([facility] is usually good for this) ? ?Call GI for follow up colonoscopy, consideration for upper endoscopy. Ok to break protonix in half and see if you have any reflux symptoms with this.  ?

## 2022-01-06 NOTE — Progress Notes (Signed)
Jeffrey Price DOB: 10/05/1956 Encounter date: 01/06/2022  This is a 66 y.o. male who presents with Chief Complaint  Patient presents with   Follow-up    History of present illness: Patient underwent cardioversion on 2/14.  Newly diagnosed atrial fibrillation at 10/19/2021 visit. Feeling better overall. Energy feels better. Breathing does feel better overall. Stopped vaping.   He brought his meds today with him to discuss.   He did see ortho for elbow follow up. He is worried that there is some purplish discoloration left elbow; wonders if needs abx.   DMII: working on getting to healthier weight. Doing some outside yard work. Plans on doing silver sneakers program as well. Continues on the jardiance 10mg . This will be too expensive for him once his supply runs out.   Stops activity if back flaring.  Neuropathy: noted some difference in feeling less foot pain with this. In past had done gabapentin, but didn't make dose change much.   Has some extra atenolol supply; he is cutting tabs with dose being recently decreased.   He has been wearing cpap. Sleep is much more effective, more rested. Working to get mask that is more comfortable. Right now he is up to 4 hours at night.   No Known Allergies Current Meds  Medication Sig   acetaminophen (TYLENOL) 325 MG tablet Take 2 tablets (650 mg total) by mouth every 6 (six) hours as needed for mild pain (or Fever >/= 101).   atenolol (TENORMIN) 50 MG tablet Take 1 tablet (50 mg total) by mouth daily.   atorvastatin (LIPITOR) 20 MG tablet TAKE 1 TABLET BY MOUTH ONCE DAILY   cetirizine (ZYRTEC) 10 MG tablet Take 10 mg by mouth daily as needed for allergies.   clotrimazole (CLOTRIMAZOLE AF) 1 % cream Apply topically twice daily as needed (rash).   Cyanocobalamin (B-12 SL) Place 1 tablet under the tongue daily.   cyclobenzaprine (FLEXERIL) 10 MG tablet TAKE 1 TABLET BY MOUTH 3 TIMES DAILY AS NEEDED FOR MUSCLE SPASMS (Patient taking differently:  Take 10 mg by mouth at bedtime.)   folic acid (FOLVITE) 1 MG tablet Take 1 tablet (1 mg total) by mouth daily.   furosemide (LASIX) 40 MG tablet Take 2 tablets (80 mg total) by mouth daily.   gabapentin (NEURONTIN) 300 MG capsule Start with 1 capsule by mouth at bedtime and increase as needed/tolerated to 1 capsule 3 times a day   meloxicam (MOBIC) 15 MG tablet TAKE 1 TABLET BY MOUTH ONCE DAILY AS NEEDED FOR INFLAMMATION   metFORMIN (GLUCOPHAGE) 500 MG tablet Take 1 tablet (500 mg total) by mouth 2 (two) times daily with a meal.   methimazole (TAPAZOLE) 5 MG tablet Take 1 tablet (5 mg total) by mouth every morning.   methocarbamol (ROBAXIN) 500 MG tablet Take 500 mg by mouth every 6 (six) hours as needed for muscle spasms.   NEEDLE, DISP, 18 G (BD HYPODERMIC NEEDLE) 18G X 1" MISC Use as directed with Testosterone   NEEDLE, DISP, 22 G (BD ECLIPSE NEEDLE) 22G X 1-1/2" MISC Use as directed   pantoprazole (PROTONIX) 40 MG tablet TAKE 1 TABLET BY MOUTH DAILY * PT NEEDS APPT   rivaroxaban (XARELTO) 20 MG TABS tablet Take 1 tablet by mouth daily with supper.   sildenafil (VIAGRA) 100 MG tablet Take 1/2-1 tablet by mouth daily as needed for erectile dysfunction.   Syringe, Disposable, 3 ML MISC Use as directed   tretinoin (RETIN-A) 0.05 % cream Apply 1 application topically daily  as needed (oil skin).   triamcinolone cream (KENALOG) 0.1 % Apply to affected area(s) twice daily for 14 days (Patient taking differently: 1 application daily as needed (Rash on leg).)   [DISCONTINUED] empagliflozin (JARDIANCE) 10 MG TABS tablet Take 1 tablet (10 mg total) by mouth daily before breakfast.   [DISCONTINUED] pregabalin (LYRICA) 200 MG capsule TAKE 1 CAPSULE BY MOUTH 2 TIMES DAILY * DO NOT TAKE GABAPENTIN WHILE TAKING THIS MED   [DISCONTINUED] SYRINGE-NEEDLE, DISP, 3 ML (B-D 3CC LUER-LOK SYR 22GX1") 22G X 1" 3 ML MISC Use as directed with Testosterone   [DISCONTINUED] testosterone cypionate (DEPOTESTOSTERONE  CYPIONATE) 200 MG/ML injection INJECT 1 ML INTO THE MUSCLE EVERY 14 DAYS    Review of Systems  Constitutional:  Negative for activity change, appetite change, chills, fatigue, fever and unexpected weight change.  HENT:  Negative for congestion, ear pain, hearing loss, sinus pressure, sinus pain, sore throat and trouble swallowing.   Eyes:  Negative for pain and visual disturbance.  Respiratory:  Negative for cough, chest tightness, shortness of breath and wheezing.   Cardiovascular:  Negative for chest pain, palpitations and leg swelling.  Gastrointestinal:  Negative for abdominal distention, abdominal pain, blood in stool, constipation, diarrhea, nausea and vomiting.  Genitourinary:  Negative for decreased urine volume, difficulty urinating, dysuria, penile pain and testicular pain.  Musculoskeletal:  Negative for arthralgias, back pain and joint swelling.  Skin:  Negative for rash.  Neurological:  Negative for dizziness, weakness, numbness and headaches.  Hematological:  Negative for adenopathy. Does not bruise/bleed easily.  Psychiatric/Behavioral:  Negative for agitation, sleep disturbance and suicidal ideas. The patient is not nervous/anxious.    Objective:  BP 116/74 (BP Location: Right Arm, Patient Position: Sitting, Cuff Size: Large)    Pulse (!) 54    Temp 97.6 F (36.4 C) (Oral)    Ht 6\' 2"  (1.88 m)    Wt 277 lb 14.4 oz (126.1 kg)    SpO2 93%    BMI 35.68 kg/m   Weight: 277 lb 14.4 oz (126.1 kg)   BP Readings from Last 3 Encounters:  01/06/22 116/74  12/22/21 107/62  12/07/21 112/60   Wt Readings from Last 3 Encounters:  01/06/22 277 lb 14.4 oz (126.1 kg)  12/07/21 279 lb 8 oz (126.8 kg)  11/30/21 281 lb 15.5 oz (127.9 kg)    Physical Exam Constitutional:      General: He is not in acute distress.    Appearance: He is well-developed.  Cardiovascular:     Rate and Rhythm: Normal rate. Rhythm irregularly irregular.     Heart sounds: Normal heart sounds. No murmur  heard.   No friction rub.  Pulmonary:     Effort: Pulmonary effort is normal. No respiratory distress.     Breath sounds: Normal breath sounds. No wheezing or rales.  Musculoskeletal:     Right lower leg: No edema.     Left lower leg: No edema.  Neurological:     Mental Status: He is alert and oriented to person, place, and time.  Psychiatric:        Behavior: Behavior normal.    Assessment/Plan  1. Other polyneuropathy We are working on correction of vitamin B12 deficiency, which should also help with nerve discomfort.  We will recheck levels today.  He has follow-up next week for lumbar injection for lumbar neuropathy.  This has not been beneficial for him in the past unfortunately.  Due to cost concern with Lyrica, we are switching him over  to gabapentin.  We will increase dose as tolerated/needed to help with neuropathic pain. - gabapentin (NEURONTIN) 300 MG capsule; Start with 1 capsule by mouth at bedtime and increase as needed/tolerated to 1 capsule 3 times a day  Dispense: 270 capsule; Refill: 1  2. Controlled type 2 diabetes mellitus with diabetic neuropathy, without long-term current use of insulin ([place]) He has done well with Jardiance, but it is cost prohibitive for him.  We are going to switch over to metformin instead. Discussed new medication(s) today with patient. Discussed potential side effects and patient verbalized understanding.  - metFORMIN (GLUCOPHAGE) 500 MG tablet; Take 1 tablet (500 mg total) by mouth 2 (two) times daily with a meal.  Dispense: 180 tablet; Refill: 1 - POC HgB A1c  3. Essential hypertension Blood pressures well controlled today.  Continue atenolol 50 mg daily.  4. Irregular heart beat Heart rate was irregular on exam, so EKG was ordered.  This shows atrial fibrillation.  Rate is at 102.  He is currently on Xarelto.  He has follow-up next week with cardiology.  I did send a message as a heads up since he also has a planned spinal injection  procedure next week and was planning on holding his anticoagulation.  Wanted them to be aware in case they wanted to plan any earlier intervention for A-fib prior to him holding anticoagulation. - EKG 12-Lead  5. Low testosterone We discussed his testosterone today.  He does not feel that replacing testosterone was extremely helpful for him from an energy standpoint and was not helpful from an erectile standpoint.  We discussed risks of testosterone from cardiac standpoint, and he is okay with stopping for now and monitoring.  His testosterone was extremely low at 1 point in the past, but he is more active now, losing weight, working on self health.  If he feels that fatigue returns, we can recheck baseline level.  Want to make sure if he is receiving treatment that it is giving some benefit.  6. B12 deficiency He has been taking 2000 g of B12 daily and 1 mg folate daily.  Re Check blood work today. - Vitamin B12; Future - Homocysteine; Future - Methylmalonic acid, serum; Future - Folate; Future   Return for pending lab or imaging results. 45 minutes spent with patient in discussion of medication changes due to cost concerns, med side effects, follow up treatment plan, specialty contact, exam, charting.    Micheline Rough, MD

## 2022-01-07 ENCOUNTER — Encounter (HOSPITAL_BASED_OUTPATIENT_CLINIC_OR_DEPARTMENT_OTHER): Payer: Self-pay

## 2022-01-07 ENCOUNTER — Telehealth: Payer: Self-pay | Admitting: *Deleted

## 2022-01-07 ENCOUNTER — Encounter: Payer: Self-pay | Admitting: Cardiovascular Disease

## 2022-01-07 NOTE — Telephone Encounter (Signed)
-----   Message from Caren Macadam, MD sent at 01/07/2022 10:57 AM EST ----- ?Please let patient know what Dr. Johnsie Cancel said below regarding patient's upcoming injection. ?----- Message ----- ?From: Josue Hector, MD ?Sent: 01/06/2022  12:46 PM EST ?To: Caren Macadam, MD, # ? ?I think its ok to hold xarelto for 3 days before injection and we can deal with recurrent afib after procedure  ?----- Message ----- ?From: Caren Macadam, MD ?Sent: 01/06/2022  12:15 PM EST ?To: Josue Hector, MD, Loel Dubonnet, NP ? ?Patient in office for routine follow up and is back in a fib. He has an appointment for nerve block injection for lumbar spine next weds, 3/8 and has been planning to hold anticoagulation for that procedure. He has follow up with you on 3/7. Just wanted to give you a heads up in case you did not want him holding anticoagulation for injection. He did put off this injection previously for his most recent cardioversion. He feels fine today and is otherwise stable.  ? ? ?

## 2022-01-07 NOTE — Telephone Encounter (Signed)
I left a detailed message at the patient's cell number with the information below.  ?

## 2022-01-07 NOTE — Telephone Encounter (Signed)
Hey this patient sent a message in to both you and Dr. Johnsie Cancel, looks like he is scheduled to see you next week! Do you want [place] to respond or let Dr.Nishan?  ?

## 2022-01-08 ENCOUNTER — Encounter: Payer: Self-pay | Admitting: Family Medicine

## 2022-01-08 DIAGNOSIS — R400 Somnolence: Secondary | ICD-10-CM | POA: Diagnosis not present

## 2022-01-08 DIAGNOSIS — G4733 Obstructive sleep apnea (adult) (pediatric): Secondary | ICD-10-CM | POA: Diagnosis not present

## 2022-01-09 LAB — METHYLMALONIC ACID, SERUM: Methylmalonic Acid, Quant: 124 nmol/L (ref 87–318)

## 2022-01-09 LAB — HOMOCYSTEINE: Homocysteine: 9 umol/L (ref <11.4)

## 2022-01-12 ENCOUNTER — Ambulatory Visit (HOSPITAL_BASED_OUTPATIENT_CLINIC_OR_DEPARTMENT_OTHER): Payer: PPO | Admitting: Family

## 2022-01-12 ENCOUNTER — Other Ambulatory Visit: Payer: Self-pay

## 2022-01-12 ENCOUNTER — Encounter (HOSPITAL_BASED_OUTPATIENT_CLINIC_OR_DEPARTMENT_OTHER): Payer: Self-pay | Admitting: Family

## 2022-01-12 ENCOUNTER — Encounter (HOSPITAL_BASED_OUTPATIENT_CLINIC_OR_DEPARTMENT_OTHER): Payer: Self-pay

## 2022-01-12 VITALS — BP 116/82 | HR 95 | Ht 74.0 in | Wt 281.0 lb

## 2022-01-12 DIAGNOSIS — I10 Essential (primary) hypertension: Secondary | ICD-10-CM | POA: Diagnosis not present

## 2022-01-12 DIAGNOSIS — G4733 Obstructive sleep apnea (adult) (pediatric): Secondary | ICD-10-CM

## 2022-01-12 DIAGNOSIS — I4819 Other persistent atrial fibrillation: Secondary | ICD-10-CM | POA: Diagnosis not present

## 2022-01-12 DIAGNOSIS — E782 Mixed hyperlipidemia: Secondary | ICD-10-CM

## 2022-01-12 DIAGNOSIS — I2510 Atherosclerotic heart disease of native coronary artery without angina pectoris: Secondary | ICD-10-CM

## 2022-01-12 DIAGNOSIS — D6859 Other primary thrombophilia: Secondary | ICD-10-CM | POA: Diagnosis not present

## 2022-01-12 DIAGNOSIS — R0609 Other forms of dyspnea: Secondary | ICD-10-CM | POA: Diagnosis not present

## 2022-01-12 DIAGNOSIS — E785 Hyperlipidemia, unspecified: Secondary | ICD-10-CM | POA: Diagnosis not present

## 2022-01-12 MED ORDER — FUROSEMIDE 40 MG PO TABS: 80.0000 mg | ORAL_TABLET | Freq: Every day | ORAL | 1 refills | Status: DC

## 2022-01-12 MED ORDER — FUROSEMIDE 80 MG PO TABS: 80.0000 mg | ORAL_TABLET | Freq: Every day | ORAL | 3 refills | Status: DC

## 2022-01-12 MED ORDER — ATENOLOL 50 MG PO TABS: 50.0000 mg | ORAL_TABLET | Freq: Every day | ORAL | 2 refills | Status: DC

## 2022-01-12 MED ORDER — ATORVASTATIN CALCIUM 20 MG PO TABS: 20.0000 mg | ORAL_TABLET | Freq: Every day | ORAL | 3 refills | Status: DC

## 2022-01-12 NOTE — Patient Instructions (Addendum)
Medication Instructions:  ?Continue your current medications.  ? ?*If you need a refill on your cardiac medications before your next appointment, please call your pharmacy* ? ?Lab Work: ?None ordered today.  ? ?Testing/Procedures: ?Your EKG today showed rate controlled atrial fibrillation.  ? ?Your Physician has requested you have a Myocardial Perfusion Imaging Study. ? ?Please arrive 15 minutes prior to your appointment time for registration and insurance purposes.  ? ?The test will take approximately 3 to 4 hours to complete; you may bring reading material. If someone comes with you to your appointment, they will need to remain in the main lobby due to limited testing space in the testing area. **If you are pregnant or breastfeeding, please notify the nuclear lab prior to your appointment**  ? ?How to prepare for your test:  ?- Do not eat or drink 3 hours prior to your test, except you may have water  ?- Do not consume products containing caffeine ( regular or decaf) 12 hours prior to your test ( coffee, chocolate, sodas or teas)  ?- Do bring a list of your current medications with you. If not listed below, you may take your medications as normal (Hold beta blocker- 24 hour prior for exercise myoview)   ?- Do wear comfortable clothes (no dresses or overalls) and walking shoes ( tennis shoes preferred), no heel or open toe shoes are allowed ?- Do not wear cologne, perfume, aftershave, or lotions ( deodorant is allowed)  ?- If these instructions are not followed, your test will be rescheduled  ? ?If you cannot keep your appointment, please provide 24 hours notice to the Nuclear Lab, to avoid a possible $50 charge to your account!  ? ? ?Follow-Up: ?At [facility], you and your health needs are our priority.  As part of our continuing mission to provide you with exceptional heart care, we have created designated Provider Care Teams.  These Care Teams include your primary Cardiologist (physician) and Advanced  Practice Providers (APPs -  Physician Assistants and Nurse Practitioners) who all work together to provide you with the care you need, when you need it. ? ?We recommend signing up for the patient portal called "MyChart".  Sign up information is provided on this After Visit Summary.  MyChart is used to connect with patients for Virtual Visits (Telemedicine).  Patients are able to view lab/test results, encounter notes, upcoming appointments, etc.  Non-urgent messages can be sent to your provider as well.   ?To learn more about what you can do with MyChart, go to [email or web address].   ? ?Your next appointment:   ?You have been referred to electrophysiology  ?AND ?Follow up as scheduled in June with Dr. Johnsie Cancel ? ?
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
## 2022-01-13 ENCOUNTER — Encounter: Payer: Self-pay | Admitting: Family Medicine

## 2022-01-13 ENCOUNTER — Telehealth: Payer: Self-pay | Admitting: Cardiovascular Disease

## 2022-01-13 DIAGNOSIS — K219 Gastro-esophageal reflux disease without esophagitis: Secondary | ICD-10-CM

## 2022-01-13 DIAGNOSIS — E0500 Thyrotoxicosis with diffuse goiter without thyrotoxic crisis or storm: Secondary | ICD-10-CM

## 2022-01-13 MED ORDER — RIVAROXABAN 20 MG PO TABS: 20.0000 mg | ORAL_TABLET | Freq: Every day | ORAL | 1 refills | Status: DC

## 2022-01-13 NOTE — Telephone Encounter (Signed)
?*  STAT* If patient is at the pharmacy, call can be transferred to refill team. ? ? ?1. Which medications need to be refilled? (please list name of each medication and dose if known) rivaroxaban (XARELTO) 20 MG TABS tablet ? ?2. Which pharmacy/location (including street and city if local pharmacy) is medication to be sent to? [place], [address] ? ?3. Do they need a 30 day or 90 day supply? 90 ? ?

## 2022-01-13 NOTE — Telephone Encounter (Signed)
Prescription refill request for Xarelto received.  ?Indication: Afib  ?Last office visit: 01/12/22 Jeffrey Price)  ?Weight: 127.5kg ?Age: 67 ?Scr: 1.07 (12/15/21) ?CrCl: 124.56m/min ? ?Appropriate dose and refill sent to requested pharmacy.  ?

## 2022-01-14 ENCOUNTER — Encounter: Payer: Self-pay | Admitting: Family Medicine

## 2022-01-14 MED ORDER — SILDENAFIL CITRATE 100 MG PO TABS: 50.0000 mg | ORAL_TABLET | Freq: Every day | ORAL | 2 refills | Status: AC | PRN

## 2022-01-14 MED ORDER — PANTOPRAZOLE SODIUM 40 MG PO TBEC: DELAYED_RELEASE_TABLET | ORAL | 1 refills | Status: DC

## 2022-01-14 MED ORDER — CYCLOBENZAPRINE HCL 10 MG PO TABS: 10.0000 mg | ORAL_TABLET | Freq: Two times a day (BID) | ORAL | 3 refills | Status: DC | PRN

## 2022-01-14 MED ORDER — METHIMAZOLE 5 MG PO TABS: 5.0000 mg | ORAL_TABLET | Freq: Every morning | ORAL | 1 refills | Status: DC

## 2022-01-14 MED ORDER — MELOXICAM 15 MG PO TABS: ORAL_TABLET | ORAL | 1 refills | Status: DC

## 2022-01-14 MED ORDER — TRIAMCINOLONE ACETONIDE 0.1 % EX CREA: TOPICAL_CREAM | CUTANEOUS | 0 refills | Status: DC

## 2022-01-15 ENCOUNTER — Telehealth (HOSPITAL_COMMUNITY): Payer: Self-pay | Admitting: *Deleted

## 2022-01-15 NOTE — Telephone Encounter (Signed)
Left message on voicemail per DPR in reference to upcoming appointment scheduled on 01/19/2022 at 1:15 with detailed instructions given per Myocardial Perfusion Study Information Sheet for the test. LM to arrive 15 minutes early, and that it is imperative to arrive on time for appointment to keep from having the test rescheduled. If you need to cancel or reschedule your appointment, please call the office within 24 hours of your appointment. Failure to do so may result in a cancellation of your appointment, and a $50 no show fee. Phone number given for call back for any questions.  ? ?

## 2022-01-17 IMAGING — RF DG C-ARM 1-60 MIN
1 series · 1 of 1 positions shown · non-contrast
Comparison: MRI of the cervical spine 05/18/2021.

CLINICAL DATA: Surgery, elective YDA.G ([id number]). Additional
history provided: ACDF C4-5. A fluoroscopy time is not provided.

EXAM:
DG CERVICAL SPINE - 1 VIEW; DG C-ARM 1-60 MIN

[Series 1: run · 1 of 1 slices shown]
[im 1/1]
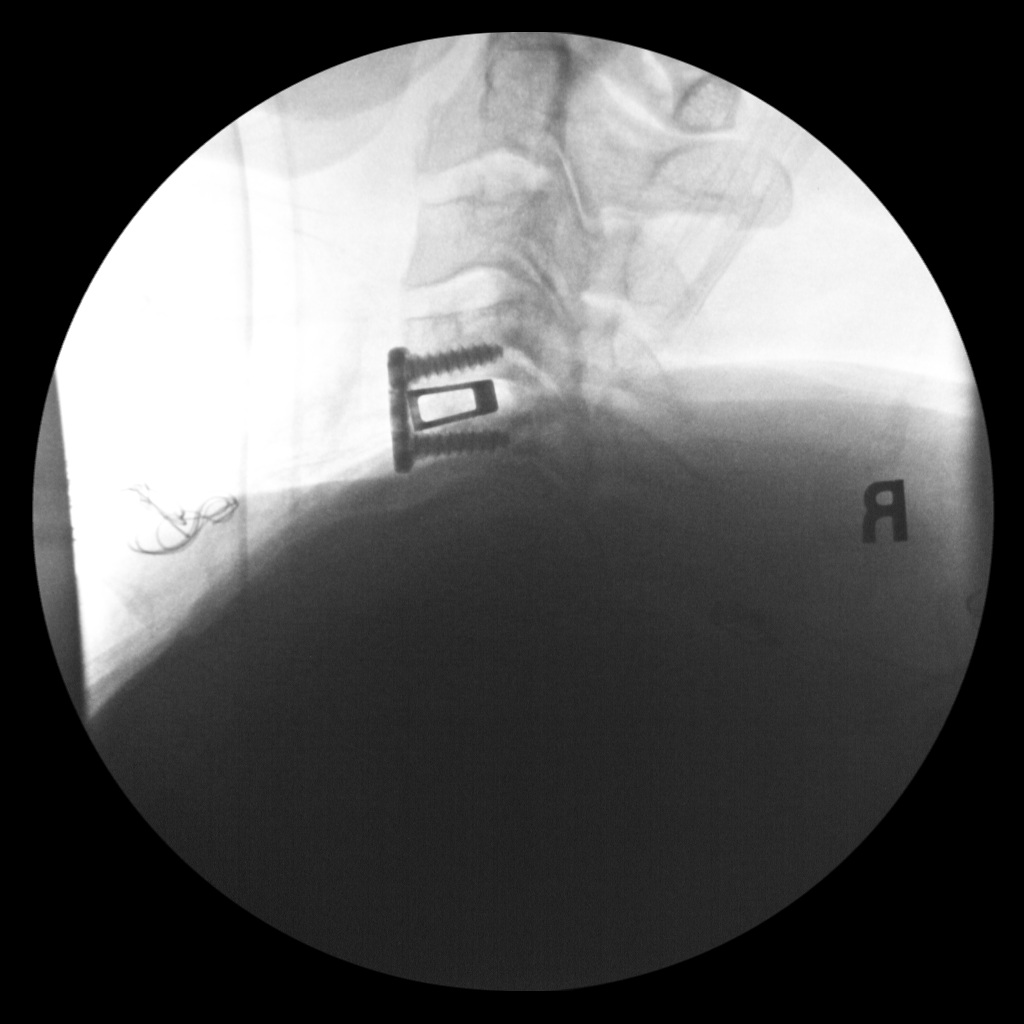

[1 of 1 positions shown; findings below may reference images not displayed]

FINDINGS: A single lateral view intraoperative fluoroscopic image of the
cervical spine is submitted. On the provided image, ACDF hardware is
present at the C4-C5 level (ventral plate and screws, as well as
interbody device). Partially visualized ET tube. Curvilinear
hyperdensity projects ventral to the cervical spine at the operative
levels, likely reflecting a surgical sponge/packing material.
IMPRESSION: Single lateral view intraoperative fluoroscopic image of the
cervical spine from C4-C5 ACDF.

Curvilinear hyperdensity projects ventral to the cervical spine at
the operative levels, likely reflecting a surgical sponge/packing
material. Correlate with the operative history.

## 2022-01-19 ENCOUNTER — Encounter (HOSPITAL_COMMUNITY): Payer: Self-pay

## 2022-01-19 ENCOUNTER — Ambulatory Visit (HOSPITAL_COMMUNITY): Payer: PPO | Attending: Cardiology

## 2022-01-19 ENCOUNTER — Other Ambulatory Visit: Payer: Self-pay

## 2022-01-19 DIAGNOSIS — R0609 Other forms of dyspnea: Secondary | ICD-10-CM | POA: Insufficient documentation

## 2022-01-19 DIAGNOSIS — I4819 Other persistent atrial fibrillation: Secondary | ICD-10-CM | POA: Insufficient documentation

## 2022-01-19 MED ORDER — TECHNETIUM TC 99M TETROFOSMIN IV KIT
31.6000 | PACK | Freq: Once | INTRAVENOUS | Status: AC | PRN
  Administered 2022-01-19: 31.6 via INTRAVENOUS
  Filled 2022-01-19: qty 32

## 2022-01-19 MED ORDER — REGADENOSON 0.4 MG/5ML IV SOLN
0.4000 mg | Freq: Once | INTRAVENOUS | Status: AC
  Administered 2022-01-19: 0.4 mg via INTRAVENOUS

## 2022-01-20 ENCOUNTER — Ambulatory Visit (HOSPITAL_COMMUNITY): Payer: PPO | Attending: Cardiology

## 2022-01-20 LAB — MYOCARDIAL PERFUSION IMAGING
LV dias vol: 92 mL (ref 62–150)
LV sys vol: 58 mL
Nuc Stress EF: 37 %
Peak HR: 103 {beats}/min
Rest HR: 94 {beats}/min
Rest Nuclear Isotope Dose: 30.1 mCi
SRS: 1
SSS: 2
ST Depression (mm): 0 mm
Stress Nuclear Isotope Dose: 31.6 mCi
TID: 1.07

## 2022-01-20 MED ORDER — TECHNETIUM TC 99M TETROFOSMIN IV KIT
30.1000 | PACK | Freq: Once | INTRAVENOUS | Status: AC | PRN
  Administered 2022-01-20: 30.1 via INTRAVENOUS
  Filled 2022-01-20: qty 31

## 2022-01-28 ENCOUNTER — Encounter: Payer: Self-pay | Admitting: Cardiology

## 2022-01-28 ENCOUNTER — Other Ambulatory Visit: Payer: Self-pay

## 2022-01-28 ENCOUNTER — Ambulatory Visit (INDEPENDENT_AMBULATORY_CARE_PROVIDER_SITE_OTHER): Payer: PPO | Admitting: Cardiology

## 2022-01-28 VITALS — BP 112/64 | HR 102 | Ht 74.0 in | Wt 279.0 lb

## 2022-01-28 DIAGNOSIS — I480 Paroxysmal atrial fibrillation: Secondary | ICD-10-CM

## 2022-01-28 NOTE — Patient Instructions (Addendum)
Medication Instructions:  ?Your physician recommends that you continue on your current medications as directed. Please refer to the Current Medication list given to you today. ? ?*If you need a refill on your cardiac medications before your next appointment, please call your pharmacy* ? ? ?Lab Work: ?None ordered. ? ?If you have labs (blood work) drawn today and your tests are completely normal, you will receive your results only by: ?MyChart Message (if you have MyChart) OR ?A paper copy in the mail ?If you have any lab test that is abnormal or we need to change your treatment, we will call you to review the results. ? ? ?Testing/Procedures: ?Your physician has recommended that you have an ablation. Catheter ablation is a medical procedure used to treat some cardiac arrhythmias (irregular heartbeats). During catheter ablation, a long, thin, flexible tube is put into a blood vessel in your groin (upper thigh), or neck. This tube is called an ablation catheter. It is then guided to your heart through the blood vessel. Radio frequency waves destroy small areas of heart tissue where abnormal heartbeats may cause an arrhythmia to start. Please see the instruction sheet given to you today.  ? ? ? ?Follow-Up: ?At [facility], you and your health needs are our priority.  As part of our continuing mission to provide you with exceptional heart care, we have created designated Provider Care Teams.  These Care Teams include your primary Cardiologist (physician) and Advanced Practice Providers (APPs -  Physician Assistants and Nurse Practitioners) who all work together to provide you with the care you need, when you need it. ? ?We recommend signing up for the patient portal called "MyChart".  Sign up information is provided on this After Visit Summary.  MyChart is used to connect with patients for Virtual Visits (Telemedicine).  Patients are able to view lab/test results, encounter notes, upcoming appointments, etc.   Non-urgent messages can be sent to your provider as well.   ?To learn more about what you can do with MyChart, go to [email or web address].   ? ?Your next appointment:  Please call or send a MyChart message once you choose a date.  June 23.2023 and May 07, 2022 are currently available.  If you would prefer a date in July please let us know that as well. ?Follow up up appointments to be scheduled - Sherri P,RN, Dr Curt Bears nurse will call you with complete instructions. ?

## 2022-01-28 NOTE — Progress Notes (Signed)
? ?Electrophysiology Office Note ? ? ?Date:  01/28/2022  ? ?ID:  Jeffrey Price, DOB 08-24-1956, MRN [id number] ? ?PCP:  Caren Macadam, MD  ?Cardiologist:  Elissa Hefty ?Primary Electrophysiologist:  Yeiren Whitecotton Meredith Leeds, MD   ? ?Chief Complaint: AF ?  ?History of Present Illness: ?Jeffrey Price is a 66 y.o. male who is being seen today for the evaluation of AF at the request of Loel Dubonnet, NP. Presenting today for electrophysiology evaluation. ? ?He has a history significant for hypertension, obesity, hyperlipidemia, hypothyroidism.  He was seen by his primary physician 10/19/2021 due to shortness of breath.  He was found to be in atrial fibrillation.  Echo showed an ejection fraction of 60 to 65% with a mildly dilated left atrium.  He had a cardioversion.  After his cardioversion he felt quite well.  He was diagnosed with sleep apnea and is now on CPAP. ? ?Today, he denies symptoms of palpitations, chest pain, shortness of breath, orthopnea, PND, lower extremity edema, claudication, dizziness, presyncope, syncope, bleeding, or neurologic sequela. The patient is tolerating medications without difficulties.  He has symptoms from atrial fibrillation including fatigue and shortness of breath.  He has to do his daily activities more slowly over the past few months.  He would like to get back into normal rhythm. ? ? ?Past Medical History:  ?Diagnosis Date  ? Arthritis   ? in back  ? Blood clot in vein 1995  ? bil legs  ? Bulging lumbar disc   ? Cataract   ? mild  ? Cellulitis 2012  ? forehead  ? Complication of anesthesia   ? had trouble waking up 06/11/2020  ? DDD (degenerative disc disease), lumbar   ? GERD (gastroesophageal reflux disease)   ? Graves disease   ? Hx of adenomatous colonic polyps 12/15/2017  ? Hyperlipidemia   ? Hypertension   ? Neuromuscular disorder ([place])   ? neuropathy in feet  ? Pre-diabetes   ? Thyrotoxicosis without thyroid storm   ? Varicose veins of both lower extremities   ? ?Past  Surgical History:  ?Procedure Laterality Date  ? ANTERIOR CERVICAL DECOMP/DISCECTOMY FUSION N/A 06/18/2021  ? Procedure: ANTERIOR CERVICAL DECOMPRESSION FUSION CERVICAL4- CERVICAL 5 WITH INSTRUEMENTATION AND ALLOGRAFT;  Surgeon: Phylliss Bob, MD;  Location: [facility];  Service: Orthopedics;  Laterality: N/A;  ? BACK SURGERY  06/11/2020  ? CARDIOVERSION N/A 12/22/2021  ? Procedure: CARDIOVERSION;  Surgeon: Josue Hector, MD;  Location: [facility] ENDOSCOPY;  Service: Cardiovascular;  Laterality: N/A;  ? ENDOVENOUS ABLATION SAPHENOUS VEIN W/ LASER Left 10/18/2019  ? endovenous laser ablation left greater saphenous vein and stab phlebectomy > 20 incisions left leg by Gae Gallop MD   ? ENDOVENOUS ABLATION SAPHENOUS VEIN W/ LASER Right 11/08/2019  ? endovenous laser ablation right greater saphenous vein and stab phlebectomy 10-20 incisions right leg by Gae Gallop MD   ? LUMBAR EPIDURAL INJECTION    ? TEE WITHOUT CARDIOVERSION N/A 12/22/2021  ? Procedure: TRANSESOPHAGEAL ECHOCARDIOGRAM (TEE);  Surgeon: Josue Hector, MD;  Location: [facility] ENDOSCOPY;  Service: Cardiovascular;  Laterality: N/A;  ? ? ? ?Current Outpatient Medications  ?Medication Sig Dispense Refill  ? acetaminophen (TYLENOL) 325 MG tablet Take 2 tablets (650 mg total) by mouth every 6 (six) hours as needed for mild pain (or Fever >/= 101).    ? atenolol (TENORMIN) 50 MG tablet Take 1 tablet (50 mg total) by mouth daily. 90 tablet 2  ? atorvastatin (LIPITOR) 20 MG tablet Take  1 tablet (20 mg total) by mouth daily. 90 tablet 3  ? cetirizine (ZYRTEC) 10 MG tablet Take 10 mg by mouth daily as needed for allergies.    ? clotrimazole (CLOTRIMAZOLE AF) 1 % cream Apply topically twice daily as needed (rash). 45 g 2  ? Cyanocobalamin (B-12 SL) Place 1 tablet under the tongue daily.    ? cyclobenzaprine (FLEXERIL) 10 MG tablet Take 1 tablet (10 mg total) by mouth 2 (two) times daily as needed for muscle spasms. 90 tablet 3  ? folic acid (FOLVITE) 1 MG tablet Take 1 tablet  (1 mg total) by mouth daily.    ? furosemide (LASIX) 80 MG tablet Take 1 tablet (80 mg total) by mouth daily. 90 tablet 3  ? gabapentin (NEURONTIN) 300 MG capsule Start with 1 capsule by mouth at bedtime and increase as needed/tolerated to 1 capsule 3 times a day 270 capsule 1  ? meloxicam (MOBIC) 15 MG tablet TAKE 1 TABLET BY MOUTH ONCE DAILY AS NEEDED FOR INFLAMMATION 90 tablet 1  ? metFORMIN (GLUCOPHAGE) 500 MG tablet Take 1 tablet (500 mg total) by mouth 2 (two) times daily with a meal. 180 tablet 1  ? methimazole (TAPAZOLE) 5 MG tablet Take 1 tablet (5 mg total) by mouth every morning. 90 tablet 1  ? NEEDLE, DISP, 18 G (BD HYPODERMIC NEEDLE) 18G X 1" MISC Use as directed with Testosterone 6 each 3  ? NEEDLE, DISP, 22 G (BD ECLIPSE NEEDLE) 22G X 1-1/2" MISC Use as directed 100 each 0  ? pantoprazole (PROTONIX) 40 MG tablet TAKE 1 TABLET BY MOUTH DAILY * PT NEEDS APPT 90 tablet 1  ? rivaroxaban (XARELTO) 20 MG TABS tablet Take 1 tablet by mouth daily with supper. 90 tablet 1  ? sildenafil (VIAGRA) 100 MG tablet Take 1/2-1 tablet by mouth daily as needed for erectile dysfunction. 30 tablet 2  ? Syringe, Disposable, 3 ML MISC Use as directed 100 each 0  ? tretinoin (RETIN-A) 0.05 % cream Apply 1 application topically daily as needed (oil skin).    ? triamcinolone cream (KENALOG) 0.1 % Apply to affected area(s) twice daily for 14 days 45 g 0  ? ?No current facility-administered medications for this visit.  ? ? ?Allergies:   Patient has no known allergies.  ? ?Social History:  The patient  reports that he quit smoking about 10 years ago. His smoking use included e-cigarettes and cigarettes. He has never used smokeless tobacco. He reports current alcohol use. He reports that he does not use drugs.  ? ?Family History:  The patient's family history includes Parkinson's disease in his father; Thyroid disease in his mother.  ? ? ?ROS:  Please see the history of present illness.   Otherwise, review of systems is positive  for none.   All other systems are reviewed and negative.  ? ? ?PHYSICAL EXAM: ?VS:  BP 112/64   Pulse (!) 102   Ht '6\' 2"'$  (1.88 m)   Wt 279 lb (126.6 kg)   SpO2 95%   BMI 35.82 kg/m?  , BMI Body mass index is 35.82 kg/m?. ?GEN: Well nourished, well developed, in no acute distress  ?HEENT: normal  ?Neck: no JVD, carotid bruits, or masses ?Cardiac: irregular, tachycardic; no murmurs, rubs, or gallops,no edema  ?Respiratory:  clear to auscultation bilaterally, normal work of breathing ?GI: soft, nontender, nondistended, + BS ?MS: no deformity or atrophy  ?Skin: warm and dry ?Neuro:  Strength and sensation are intact ?Psych: euthymic mood,  full affect ? ?EKG:  EKG is not ordered today. ?Personal review of the ekg ordered 01/12/22 shows atrial fibrillation, rate 95 ? ?Recent Labs: ?10/09/2021: TSH 2.16 ?12/07/2021: ALT 24 ?12/15/2021: BUN 17; Creatinine, Ser 1.07; Hemoglobin 18.6; Platelets 309; Potassium 4.5; Sodium 136  ? ? ?Lipid Panel  ?   ?Component Value Date/Time  ? CHOL 102 10/09/2021 1251  ? CHOL 151 11/29/2018 1715  ? TRIG 136.0 10/09/2021 1251  ? HDL 35.60 (L) 10/09/2021 1251  ? HDL 33 (L) 11/29/2018 1715  ? CHOLHDL 3 10/09/2021 1251  ? VLDL 27.2 10/09/2021 1251  ? LDLCALC 40 10/09/2021 1251  ? [place]  08/08/2020 1036  ?   Comment:  ?   . ?LDL cholesterol not calculated. Triglyceride levels ?greater than 400 mg/dL invalidate calculated LDL results. ?. ?Reference range: <100 ?Marland Kitchen ?Desirable range <100 mg/dL for primary prevention;   ?<70 mg/dL for patients with CHD or diabetic patients  ?with > or = 2 CHD risk factors. ?. ?LDL-C is now calculated using the Martin-Hopkins  ?calculation, which is a validated novel method providing  ?better accuracy than the Friedewald equation in the  ?estimation of LDL-C.  ?Cresenciano Genre et al. Annamaria Helling. 9244;628(63): 2061-2068  ?(http://education.[employer].com/faq/FAQ164) ?  ? ? ? ?Wt Readings from Last 3 Encounters:  ?01/28/22 279 lb (126.6 kg)  ?01/19/22 281 lb (127.5 kg)   ?01/12/22 281 lb (127.5 kg)  ?  ? ? ?Other studies Reviewed: ?Additional studies/ records that were reviewed today include: TTE 11/05/21  ?Review of the above records today demonstrates:  ? 1. Left ventricular eje

## 2022-02-05 ENCOUNTER — Encounter: Payer: Self-pay | Admitting: Cardiology

## 2022-02-05 ENCOUNTER — Encounter: Payer: Self-pay | Admitting: Family Medicine

## 2022-02-06 MED ORDER — TRETINOIN 0.05 % EX CREA: 1.0000 "application " | TOPICAL_CREAM | Freq: Every day | CUTANEOUS | 3 refills | Status: DC | PRN

## 2022-02-06 MED ORDER — GABAPENTIN 800 MG PO TABS: 800.0000 mg | ORAL_TABLET | Freq: Three times a day (TID) | ORAL | 1 refills | Status: DC

## 2022-02-08 ENCOUNTER — Encounter: Payer: Self-pay | Admitting: Adult Health

## 2022-02-08 ENCOUNTER — Ambulatory Visit: Payer: PPO | Admitting: Adult Health

## 2022-02-08 VITALS — BP 123/77 | HR 84 | Ht 74.0 in | Wt 280.0 lb

## 2022-02-08 DIAGNOSIS — G4731 Primary central sleep apnea: Secondary | ICD-10-CM

## 2022-02-08 DIAGNOSIS — Z9989 Dependence on other enabling machines and devices: Secondary | ICD-10-CM

## 2022-02-08 DIAGNOSIS — G4733 Obstructive sleep apnea (adult) (pediatric): Secondary | ICD-10-CM | POA: Diagnosis not present

## 2022-02-08 DIAGNOSIS — R400 Somnolence: Secondary | ICD-10-CM | POA: Diagnosis not present
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
## 2022-02-08 NOTE — Progress Notes (Signed)
Order for mas refit has been sent to [facility]. ?

## 2022-02-08 NOTE — Patient Instructions (Addendum)
Continue use of CPAP and ensure you are using over 4 hours nightly  ? ?Continue to follow with cardiology as scheduled  ? ?Will place order to DME company to request mask refitting to help with leaks  ? ? ? ?Follow up in 6 months or call earlier if needed ?

## 2022-02-24 ENCOUNTER — Other Ambulatory Visit (HOSPITAL_COMMUNITY): Payer: Self-pay

## 2022-03-10 ENCOUNTER — Other Ambulatory Visit (HOSPITAL_COMMUNITY): Payer: Self-pay

## 2022-03-10 ENCOUNTER — Other Ambulatory Visit: Payer: Self-pay | Admitting: Family Medicine

## 2022-03-10 DIAGNOSIS — G6289 Other specified polyneuropathies: Secondary | ICD-10-CM

## 2022-03-10 DIAGNOSIS — R400 Somnolence: Secondary | ICD-10-CM | POA: Diagnosis not present

## 2022-03-10 DIAGNOSIS — G4733 Obstructive sleep apnea (adult) (pediatric): Secondary | ICD-10-CM | POA: Diagnosis not present

## 2022-03-11 ENCOUNTER — Other Ambulatory Visit (HOSPITAL_COMMUNITY): Payer: Self-pay

## 2022-03-11 NOTE — Telephone Encounter (Signed)
Please see what dose of gabapentin his is taking now and whether it is helping or not so I know how to adjust/what to send in ?

## 2022-03-13 ENCOUNTER — Encounter: Payer: Self-pay | Admitting: Family Medicine

## 2022-03-15 ENCOUNTER — Other Ambulatory Visit (HOSPITAL_COMMUNITY): Payer: Self-pay

## 2022-03-15 MED ORDER — GABAPENTIN 800 MG PO TABS
800.0000 mg | ORAL_TABLET | Freq: Three times a day (TID) | ORAL | 1 refills | Status: DC
Start: 2022-03-15 — End: 2022-09-17
  Filled 2022-03-15: qty 270, 90d supply, fill #0
  Filled 2022-05-31: qty 270, 90d supply, fill #1

## 2022-03-17 ENCOUNTER — Other Ambulatory Visit (HOSPITAL_COMMUNITY): Payer: Self-pay

## 2022-03-22 ENCOUNTER — Ambulatory Visit: Payer: PPO | Admitting: Family Medicine

## 2022-03-22 ENCOUNTER — Encounter: Payer: Self-pay | Admitting: Family Medicine

## 2022-03-22 NOTE — Progress Notes (Unsigned)
No SHOW for chronic condition follow up ?

## 2022-03-29 ENCOUNTER — Other Ambulatory Visit (HOSPITAL_COMMUNITY): Payer: Self-pay

## 2022-03-31 ENCOUNTER — Encounter: Payer: Self-pay | Admitting: Family Medicine

## 2022-03-31 ENCOUNTER — Ambulatory Visit (INDEPENDENT_AMBULATORY_CARE_PROVIDER_SITE_OTHER): Payer: PPO | Admitting: Family Medicine

## 2022-03-31 ENCOUNTER — Other Ambulatory Visit (HOSPITAL_COMMUNITY): Payer: Self-pay

## 2022-03-31 VITALS — BP 102/82 | HR 87 | Temp 97.5°F | Ht 74.0 in | Wt 282.2 lb

## 2022-03-31 DIAGNOSIS — I480 Paroxysmal atrial fibrillation: Secondary | ICD-10-CM

## 2022-03-31 DIAGNOSIS — I10 Essential (primary) hypertension: Secondary | ICD-10-CM | POA: Diagnosis not present

## 2022-03-31 DIAGNOSIS — E785 Hyperlipidemia, unspecified: Secondary | ICD-10-CM

## 2022-03-31 DIAGNOSIS — E1142 Type 2 diabetes mellitus with diabetic polyneuropathy: Secondary | ICD-10-CM | POA: Diagnosis not present

## 2022-03-31 DIAGNOSIS — G629 Polyneuropathy, unspecified: Secondary | ICD-10-CM | POA: Diagnosis not present

## 2022-03-31 MED ORDER — GABAPENTIN 100 MG PO CAPS
ORAL_CAPSULE | ORAL | 1 refills | Status: DC
  Filled 2022-03-31: qty 270, 30d supply, fill #0
  Filled 2022-05-31: qty 270, 30d supply, fill #1

## 2022-03-31 MED ORDER — FUROSEMIDE 80 MG PO TABS
80.0000 mg | ORAL_TABLET | Freq: Two times a day (BID) | ORAL | 3 refills | Status: DC | PRN
  Filled 2022-03-31: qty 180, 90d supply, fill #0
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
## 2022-04-06 ENCOUNTER — Encounter: Payer: Self-pay | Admitting: Cardiovascular Disease

## 2022-04-06 NOTE — Progress Notes (Incomplete)
Cardiology Office Note   Date:  04/06/2022   ID:  [name], DOB 09/27/1956, MRN [id number]  PCP:  Caren Macadam, MD  Cardiologist:   Jenkins Rouge, MD   No chief complaint on file.    History of Present Illness: Jeffrey Price is a 66 y.o. male first seen 12/04/18  regarding edema and HTN.  Referred by Dr Carlota Raspberry.  Long standing HTN. Been on diuretic and beta blockers. He has thyroid disease and HLD as well Activity Limited by back pain. And arthritis  Uses lyrica for neuropathy   Drives a truck.  Has bad neuropathy and back pain that limits activity Has gained 50 lbs since being Rx for hyperthyroidism Previous history of DVT in both LE;s Had laser RX by [facility] December 2020 both legs   Widowed 7 years ago wife had lung cancer. Has long time girlfriend One daughter and some step children His step son is Carlean Purl who played ball with my son Olevia Perches  I have not seen in 2 years seen mostly by afib clinic and Dr Curt Bears. Had fatigue / dyspnea beginning of year found to be in afib. Started on xarelto TEE/DCC 12/22/21 with conversion but had ERAF Seen by Dr Curt Bears 01/28/22 and set up for ablation in near future   Myovue 01/20/22 no ischemia EF estimated 37% but in afib TEE 12/22/21 EF 60-65% Mild LAE mild MR AV sclerosis trivial AR  ***    Past Medical History:  Diagnosis Date   Arthritis    in back   Blood clot in vein 1995   bil legs   Bulging lumbar disc    Cataract    mild   Cellulitis 2012   forehead   Complication of anesthesia    had trouble waking up 06/11/2020   DDD (degenerative disc disease), lumbar    GERD (gastroesophageal reflux disease)    Graves disease    Hx of adenomatous colonic polyps 12/15/2017   Hyperlipidemia    Hypertension    Neuromuscular disorder (HCC)    neuropathy in feet   Pre-diabetes    Thyrotoxicosis without thyroid storm    Varicose veins of both lower extremities     Past Surgical History:  Procedure Laterality Date    ANTERIOR CERVICAL DECOMP/DISCECTOMY FUSION N/A 06/18/2021   Procedure: ANTERIOR CERVICAL DECOMPRESSION FUSION CERVICAL4- CERVICAL 5 WITH INSTRUEMENTATION AND ALLOGRAFT;  Surgeon: Phylliss Bob, MD;  Location: [facility];  Service: Orthopedics;  Laterality: N/A;   BACK SURGERY  06/11/2020   CARDIOVERSION N/A 12/22/2021   Procedure: CARDIOVERSION;  Surgeon: Josue Hector, MD;  Location: [facility];  Service: Cardiovascular;  Laterality: N/A;   ENDOVENOUS ABLATION SAPHENOUS VEIN W/ LASER Left 10/18/2019   endovenous laser ablation left greater saphenous vein and stab phlebectomy > 20 incisions left leg by Gae Gallop MD    ENDOVENOUS ABLATION SAPHENOUS VEIN W/ LASER Right 11/08/2019   endovenous laser ablation right greater saphenous vein and stab phlebectomy 10-20 incisions right leg by Gae Gallop MD    LUMBAR EPIDURAL INJECTION     TEE WITHOUT CARDIOVERSION N/A 12/22/2021   Procedure: TRANSESOPHAGEAL ECHOCARDIOGRAM (TEE);  Surgeon: Josue Hector, MD;  Location: [facility] END[facility]COPY;  Service: Cardiovascular;  Laterality: N/A;     Current Outpatient Medications  Medication Sig Dispense Refill   acetaminophen (TYLENOL) 325 MG tablet Take 2 tablets (650 mg total) by mouth every 6 (six) hours as needed for mild pain (or Fever >/= 101).  atenolol (TENORMIN) 50 MG tablet Take 1 tablet (50 mg total) by mouth daily. 90 tablet 2   atorvastatin (LIPITOR) 20 MG tablet Take 1 tablet (20 mg total) by mouth daily. 90 tablet 3   cetirizine (ZYRTEC) 10 MG tablet Take 10 mg by mouth daily as needed for allergies.     clotrimazole (CLOTRIMAZOLE AF) 1 % cream Apply topically twice daily as needed (rash). 45 g 2   Cyanocobalamin (B-12 SL) Place 1 tablet under the tongue daily.     cyclobenzaprine (FLEXERIL) 10 MG tablet Take 1 tablet (10 mg total) by mouth 2 (two) times daily as needed for muscle spasms. 90 tablet 3   folic acid (FOLVITE) 1 MG tablet Take 1 tablet (1 mg total) by mouth daily.     furosemide  (LASIX) 80 MG tablet Take 1 tablet by mouth 2 times daily as needed. 180 tablet 3   gabapentin (NEURONTIN) 100 MG capsule Take 1 - 3 capsules by mouth 3 times daily as needed for neuropathic pain. (in addition to the 800 mg tablets) 270 capsule 1   gabapentin (NEURONTIN) 800 MG tablet Take 1 tablet (800 mg total) by mouth 3 (three) times daily. 270 tablet 1   meloxicam (MOBIC) 15 MG tablet TAKE 1 TABLET BY MOUTH ONCE DAILY AS NEEDED FOR INFLAMMATION 90 tablet 1   metFORMIN (GLUCOPHAGE) 500 MG tablet Take 1 tablet (500 mg total) by mouth 2 (two) times daily with a meal. 180 tablet 1   methimazole (TAPAZOLE) 5 MG tablet Take 1 tablet (5 mg total) by mouth every morning. 90 tablet 1   NEEDLE, DISP, 18 G (BD HYPODERMIC NEEDLE) 18G X 1" MISC Use as directed with Testosterone 6 each 3   NEEDLE, DISP, 22 G (BD ECLIPSE NEEDLE) 22G X 1-1/2" MISC Use as directed 100 each 0   pantoprazole (PROTONIX) 40 MG tablet TAKE 1 TABLET BY MOUTH DAILY * PT NEEDS APPT 90 tablet 1   rivaroxaban (XARELTO) 20 MG TABS tablet Take 1 tablet by mouth daily with supper. 90 tablet 1   sildenafil (VIAGRA) 100 MG tablet Take 1/2-1 tablet by mouth daily as needed for erectile dysfunction. 30 tablet 2   Syringe, Disposable, 3 ML MISC Use as directed 100 each 0   tretinoin (RETIN-A) 0.05 % cream Apply 1 application. topically daily as needed (oil skin). 45 g 3   triamcinolone cream (KENALOG) 0.1 % Apply to affected area(s) twice daily for 14 days 45 g 0   No current facility-administered medications for this visit.    Allergies:   Patient has no known allergies.    Social History:  The patient  reports that he quit smoking about 10 years ago. His smoking use included e-cigarettes and cigarettes. He has never used smokeless tobacco. He reports current alcohol use. He reports that he does not use drugs.   Family History:  The patient's family history includes Parkinson's disease in his father; Thyroid disease in his mother.     ROS:  Please see the history of present illness.   Otherwise, review of systems are positive for none.   All other systems are reviewed and negative.    PHYSICAL EXAM: VS:  There were no vitals taken for this visit. , BMI There is no height or weight on file to calculate BMI. Affect appropriate Healthy:  appears stated age 43: normal Neck supple with no adenopathy JVP normal no bruits no thyromegaly Lungs clear with no wheezing and good diaphragmatic motion Heart:  S1/S2 no murmur, no rub, gallop or click PMI normal Abdomen: benighn, BS positve, no tenderness, no AAA no bruit.  No HSM or HJR Distal pulses intact with no bruits Bilateral varicosities and edema Neuro non-focal Skin warm and dry No muscular weakness   EKG:   Afib RAD rate 83 01/12/22    Recent Labs: 10/09/2021: TSH 2.16 12/07/2021: ALT 24 12/15/2021: BUN 17; Creatinine, Ser 1.07; Hemoglobin 18.6; Platelets 309; Potassium 4.5; Sodium 136    Lipid Panel    Component Value Date/Time   CHOL 102 10/09/2021 1251   CHOL 151 11/29/2018 1715   TRIG 136.0 10/09/2021 1251   HDL 35.60 (L) 10/09/2021 1251   HDL 33 (L) 11/29/2018 1715   CHOLHDL 3 10/09/2021 1251   VLDL 27.2 10/09/2021 1251   LDLCALC 40 10/09/2021 1251   LDLCALC  08/08/2020 1036     Comment:     . LDL cholesterol not calculated. Triglyceride levels greater than 400 mg/dL invalidate calculated LDL results. . Reference range: <100 . Desirable range <100 mg/dL for primary prevention;   <70 mg/dL for patients with CHD or diabetic patients  with > or = 2 CHD risk factors. Marland Kitchen LDL-C is now calculated using the Martin-Hopkins  calculation, which is a validated novel method providing  better accuracy than the Friedewald equation in the  estimation of LDL-C.  Cresenciano Genre et al. Annamaria Helling. 8921;194(17): 2061-2068  (http://education.[employer].com/faq/FAQ164)       Wt Readings from Last 3 Encounters:  03/31/22 282 lb 3.2 oz (128 kg)  02/08/22 280  lb (127 kg)  01/28/22 279 lb (126.6 kg)      Other studies Reviewed: Additional studies/ records that were reviewed today include: notes from primary labs . Myovue 01/20/22 TEE 12/22/21    ASSESSMENT AND PLAN:  1.  HTN:  Well controlled.  Continue current medications and low sodium Dash type diet.   2. Edema:  This is from obesity and varicosities with history of LE DVT' s On lasix Seen by Dr Yetta Barre now post endovenous laser ablation and stab phlebectomies 10/18/19 and 11/08/19 3. HLD:  Continue statin labs with primary 4. Thyroid:  Normal TSH continue Tapazole f/u endocrine  5 Neuropathy:  Continue lyrica needs f/u neuro as it is not very effective 6. Low T:  Started back on shots helped in past   7. Graves:  TSH normal but still on Tapazole continue atenolol f/u Dr Renne Crigler  9. PAF  Seen mostly by afib clinic and Dr Curt Bears [facility]AS 4 On Xarelto. [facility] 12/22/21 with ERAF.  For ablation with Dr Curt Bears in near future Rate control fine with atenolol   Current medicines are reviewed at length with the patient today.  The patient does not have concerns regarding medicines.   The following changes have been made: None   Labs/ tests ordered today include: None   No orders of the defined types were placed in this encounter.    Disposition:   FU with EP  F/U general cardiology in a year     Signed, Jenkins Rouge, MD  04/06/2022 10:25 AM    [address], [place], [place]  [address] Phone: [phone]; Fax: [phone]

## 2022-04-06 NOTE — Telephone Encounter (Signed)
Normal myoview. Okay to delay appt with Dr. Johnsie Cancel until July or August  Loel Dubonnet, NP

## 2022-04-06 NOTE — Telephone Encounter (Signed)
Called pt rescheduled OV with Dr. Johnsie Cancel to 07/05/22 at 9 am.

## 2022-04-08 DIAGNOSIS — G4733 Obstructive sleep apnea (adult) (pediatric): Secondary | ICD-10-CM | POA: Diagnosis not present

## 2022-04-09 ENCOUNTER — Ambulatory Visit: Payer: PPO | Admitting: Cardiovascular Disease

## 2022-04-10 DIAGNOSIS — G4733 Obstructive sleep apnea (adult) (pediatric): Secondary | ICD-10-CM | POA: Diagnosis not present

## 2022-04-10 DIAGNOSIS — R400 Somnolence: Secondary | ICD-10-CM | POA: Diagnosis not present

## 2022-04-12 ENCOUNTER — Other Ambulatory Visit (INDEPENDENT_AMBULATORY_CARE_PROVIDER_SITE_OTHER): Payer: PPO

## 2022-04-12 DIAGNOSIS — E1142 Type 2 diabetes mellitus with diabetic polyneuropathy: Secondary | ICD-10-CM

## 2022-04-12 DIAGNOSIS — I10 Essential (primary) hypertension: Secondary | ICD-10-CM | POA: Diagnosis not present

## 2022-04-12 LAB — COMPREHENSIVE METABOLIC PANEL
ALT: 45 U/L (ref 0–53)
AST: 43 U/L — ABNORMAL HIGH (ref 0–37)
Albumin: 4.3 g/dL (ref 3.5–5.2)
Alkaline Phosphatase: 82 U/L (ref 39–117)
BUN: 21 mg/dL (ref 6–23)
CO2: 28 mEq/L (ref 19–32)
Calcium: 9.6 mg/dL (ref 8.4–10.5)
Chloride: 98 mEq/L (ref 96–112)
Creatinine, Ser: 0.95 mg/dL (ref 0.40–1.50)
GFR: 84.06 mL/min (ref >60.00)
Glucose, Bld: 128 mg/dL — ABNORMAL HIGH (ref 70–99)
Potassium: 3.6 mEq/L (ref 3.5–5.1)
Sodium: 135 mEq/L (ref 135–145)
Total Bilirubin: 0.7 mg/dL (ref 0.2–1.2)
Total Protein: 7.6 g/dL (ref 6.0–8.3)

## 2022-04-12 LAB — CBC WITH DIFFERENTIAL/PLATELET
Basophils Absolute: 0 10*3/uL (ref 0.0–0.1)
Basophils Relative: 0.6 % (ref 0.0–3.0)
Eosinophils Absolute: 0.2 10*3/uL (ref 0.0–0.7)
Eosinophils Relative: 2.1 % (ref 0.0–5.0)
HCT: 40.8 % (ref 39.0–52.0)
Hemoglobin: 14 g/dL (ref 13.0–17.0)
Lymphocytes Relative: 27.6 % (ref 12.0–46.0)
Lymphs Abs: 2.2 10*3/uL (ref 0.7–4.0)
MCHC: 34.4 g/dL (ref 30.0–36.0)
MCV: 90 fl (ref 78.0–100.0)
Monocytes Absolute: 0.7 10*3/uL (ref 0.1–1.0)
Monocytes Relative: 9.1 % (ref 3.0–12.0)
Neutro Abs: 4.8 10*3/uL (ref 1.4–7.7)
Neutrophils Relative %: 60.6 % (ref 43.0–77.0)
Platelets: 224 10*3/uL (ref 150.0–400.0)
RBC: 4.53 Mil/uL (ref 4.22–5.81)
RDW: 16 % — ABNORMAL HIGH (ref 11.5–15.5)
WBC: 7.9 10*3/uL (ref 4.0–10.5)

## 2022-04-12 LAB — HEMOGLOBIN A1C: Hgb A1c MFr Bld: 6.8 % — ABNORMAL HIGH (ref 4.6–6.5)

## 2022-04-13 DIAGNOSIS — G4733 Obstructive sleep apnea (adult) (pediatric): Secondary | ICD-10-CM | POA: Diagnosis not present

## 2022-04-19 DIAGNOSIS — G4733 Obstructive sleep apnea (adult) (pediatric): Secondary | ICD-10-CM | POA: Diagnosis not present

## 2022-04-21 ENCOUNTER — Encounter: Payer: Self-pay | Admitting: *Deleted

## 2022-04-29 ENCOUNTER — Other Ambulatory Visit: Payer: Self-pay

## 2022-04-29 ENCOUNTER — Other Ambulatory Visit: Payer: PPO

## 2022-04-29 DIAGNOSIS — I4819 Other persistent atrial fibrillation: Secondary | ICD-10-CM | POA: Diagnosis not present

## 2022-04-29 DIAGNOSIS — I480 Paroxysmal atrial fibrillation: Secondary | ICD-10-CM

## 2022-04-29 LAB — CBC
Hematocrit: 39.7 % (ref 37.5–51.0)
Hemoglobin: 13.7 g/dL (ref 13.0–17.7)
MCH: 31.4 pg (ref 26.6–33.0)
MCHC: 34.5 g/dL (ref 31.5–35.7)
MCV: 91 fL (ref 79–97)
Platelets: 220 10*3/uL (ref 150–450)
RBC: 4.36 x10E6/uL (ref 4.14–5.80)
RDW: 14 % (ref 11.6–15.4)
WBC: 7 10*3/uL (ref 3.4–10.8)

## 2022-04-29 LAB — BASIC METABOLIC PANEL
BUN/Creatinine Ratio: 18 (ref 10–24)
BUN: 16 mg/dL (ref 8–27)
CO2: 24 mmol/L (ref 20–29)
Calcium: 9.1 mg/dL (ref 8.6–10.2)
Chloride: 98 mmol/L (ref 96–106)
Creatinine, Ser: 0.88 mg/dL (ref 0.76–1.27)
Glucose: 132 mg/dL — ABNORMAL HIGH (ref 70–99)
Potassium: 4.4 mmol/L (ref 3.5–5.2)
Sodium: 136 mmol/L (ref 134–144)
eGFR: 95 mL/min/{1.73_m2} (ref >59)

## 2022-04-30 ENCOUNTER — Telehealth: Payer: Self-pay | Admitting: *Deleted

## 2022-04-30 ENCOUNTER — Other Ambulatory Visit: Payer: Self-pay | Admitting: *Deleted

## 2022-04-30 DIAGNOSIS — I4819 Other persistent atrial fibrillation: Secondary | ICD-10-CM

## 2022-05-03 ENCOUNTER — Telehealth (HOSPITAL_COMMUNITY): Payer: Self-pay | Admitting: Emergency Medicine

## 2022-05-03 DIAGNOSIS — I4819 Other persistent atrial fibrillation: Secondary | ICD-10-CM

## 2022-05-03 MED ORDER — METOPROLOL TARTRATE 100 MG PO TABS: 100.0000 mg | ORAL_TABLET | Freq: Once | ORAL | 0 refills | Status: DC

## 2022-05-03 NOTE — Telephone Encounter (Signed)
Reaching out to patient to offer assistance regarding upcoming cardiac imaging study; pt verbalizes understanding of appt date/time, parking situation and where to check in, pre-test NPO status and medications ordered, and verified current allergies; name and call back number provided for further questions should they arise Cayne Yom RN Navigator Cardiac Imaging [facility] [phone] office [phone] cell 

## 2022-05-04 ENCOUNTER — Ambulatory Visit (HOSPITAL_COMMUNITY)
Admission: RE | Admit: 2022-05-04 | Discharge: 2022-05-04 | Disposition: A | Discharge status: Home / Self Care | Payer: PPO | Source: Ambulatory Visit | Attending: Cardiology | Admitting: Cardiology

## 2022-05-04 ENCOUNTER — Encounter (HOSPITAL_COMMUNITY): Payer: Self-pay

## 2022-05-04 DIAGNOSIS — I4819 Other persistent atrial fibrillation: Secondary | ICD-10-CM | POA: Insufficient documentation

## 2022-05-04 MED ORDER — IOHEXOL 350 MG/ML SOLN
100.0000 mL | Freq: Once | INTRAVENOUS | Status: AC | PRN
  Administered 2022-05-04: 100 mL via INTRAVENOUS

## 2022-05-06 ENCOUNTER — Encounter (HOSPITAL_COMMUNITY): Payer: Self-pay | Admitting: Certified Registered"

## 2022-05-06 NOTE — Pre-Procedure Instructions (Signed)
Attempted to call patient regarding procedure instructions.  Left voice mail on the following items: Arrival time 0530 Nothing to eat or drink after midnight No meds AM of procedure Responsible person to drive you home and stay with you for 24 hrs  Have you missed any doses of anti-coagulant on xarelto

## 2022-05-07 ENCOUNTER — Encounter (HOSPITAL_COMMUNITY)
Admission: RE | Disposition: A | Discharge status: Home / Self Care | Payer: PPO | Source: Ambulatory Visit | Attending: Cardiology

## 2022-05-07 ENCOUNTER — Ambulatory Visit (HOSPITAL_COMMUNITY)
Admission: RE | Admit: 2022-05-07 | Discharge: 2022-05-07 | Disposition: A | Discharge status: Home / Self Care | Payer: PPO | Source: Ambulatory Visit | Attending: Cardiology | Admitting: Cardiology

## 2022-05-07 ENCOUNTER — Encounter (HOSPITAL_COMMUNITY): Payer: Self-pay | Admitting: Cardiology

## 2022-05-07 DIAGNOSIS — Z539 Procedure and treatment not carried out, unspecified reason: Secondary | ICD-10-CM | POA: Insufficient documentation

## 2022-05-07 DIAGNOSIS — I4811 Longstanding persistent atrial fibrillation: Secondary | ICD-10-CM

## 2022-05-07 DIAGNOSIS — I4891 Unspecified atrial fibrillation: Secondary | ICD-10-CM | POA: Insufficient documentation

## 2022-05-07 DIAGNOSIS — Z20822 Contact with and (suspected) exposure to covid-19: Secondary | ICD-10-CM | POA: Insufficient documentation

## 2022-05-07 LAB — SARS CORONAVIRUS 2 BY RT PCR: SARS Coronavirus 2 by RT PCR: NEGATIVE

## 2022-05-07 SURGERY — ATRIAL FIBRILLATION ABLATION
Anesthesia: General

## 2022-05-07 MED ORDER — SODIUM CHLORIDE 0.9 % IV SOLN: INTRAVENOUS | Status: DC

## 2022-05-07 NOTE — H&P (Addendum)
Patient presented to the hospital for atrial fibrillation ablation. Overnight last night developed nasal congestion, sore throat and low grade fever with significant fatigue. In discussion with the patient and anesthesia, have decided to cancel the procedure and reschedule for a later date.  Jeffrey Price Curt Bears, MD 05/07/2022 7:29 AM

## 2022-05-10 ENCOUNTER — Other Ambulatory Visit (HOSPITAL_COMMUNITY): Payer: Self-pay

## 2022-05-10 DIAGNOSIS — G4733 Obstructive sleep apnea (adult) (pediatric): Secondary | ICD-10-CM | POA: Diagnosis not present

## 2022-05-10 DIAGNOSIS — R400 Somnolence: Secondary | ICD-10-CM | POA: Diagnosis not present

## 2022-05-18 ENCOUNTER — Telehealth: Payer: Self-pay | Admitting: *Deleted

## 2022-05-18 NOTE — Telephone Encounter (Signed)
Left message to call back to r/s ablation

## 2022-05-24 NOTE — Telephone Encounter (Signed)
Left message to call back  

## 2022-05-25 ENCOUNTER — Encounter: Payer: Self-pay | Admitting: Cardiology

## 2022-05-25 NOTE — Telephone Encounter (Signed)
Pt aware I am still working on options for procedure dates. Aware I will follow up by end of week to schedule concrete date for ablation. Patient verbalized understanding and agreeable to plan.

## 2022-05-25 NOTE — Telephone Encounter (Signed)
Follow Up:      Patient is returning  Jeffrey Price's  call.

## 2022-05-28 ENCOUNTER — Other Ambulatory Visit: Payer: Self-pay | Admitting: Cardiovascular Disease

## 2022-05-30 ENCOUNTER — Encounter: Payer: Self-pay | Admitting: Neurology

## 2022-05-31 ENCOUNTER — Other Ambulatory Visit (HOSPITAL_COMMUNITY): Payer: Self-pay

## 2022-05-31 ENCOUNTER — Ambulatory Visit: Payer: PPO | Admitting: Neurology

## 2022-05-31 ENCOUNTER — Encounter: Payer: Self-pay | Admitting: Neurology

## 2022-05-31 VITALS — BP 133/68 | HR 91 | Ht 74.0 in | Wt 283.0 lb

## 2022-05-31 DIAGNOSIS — I7121 Aneurysm of the ascending aorta, without rupture: Secondary | ICD-10-CM

## 2022-05-31 DIAGNOSIS — G4733 Obstructive sleep apnea (adult) (pediatric): Secondary | ICD-10-CM | POA: Diagnosis not present

## 2022-05-31 DIAGNOSIS — I480 Paroxysmal atrial fibrillation: Secondary | ICD-10-CM | POA: Diagnosis not present

## 2022-05-31 DIAGNOSIS — G4734 Idiopathic sleep related nonobstructive alveolar hypoventilation: Secondary | ICD-10-CM | POA: Diagnosis not present

## 2022-05-31 NOTE — Telephone Encounter (Signed)
Prescription refill request for Xarelto received.  Indication:Afib Last office visit:3/23 Weight:129.3 kg Age:66 Scr:0.8 CrCl:168.36  ml/min  Prescription refilled
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
## 2022-06-02 ENCOUNTER — Telehealth: Payer: Self-pay | Admitting: Family Medicine

## 2022-06-02 NOTE — Telephone Encounter (Signed)
Pt is calling and would like TOC with cory  his girlfriend has already est with cory margie reynolds

## 2022-06-02 NOTE — Telephone Encounter (Signed)
Please advise 

## 2022-06-03 NOTE — Telephone Encounter (Signed)
Please advise 

## 2022-06-03 NOTE — Telephone Encounter (Signed)
Patient notified of update  and verbalized understanding. 

## 2022-06-03 NOTE — Telephone Encounter (Signed)
Late entry:  spoke w/ pt 7/21 and  agreed to schedule ablation for 11/7 and I would be in touch to go over instructions. Pt was agreeable to plan.

## 2022-06-04 ENCOUNTER — Ambulatory Visit (HOSPITAL_COMMUNITY): Payer: PPO | Admitting: Physician Assistant

## 2022-06-04 NOTE — Telephone Encounter (Signed)
Pt has been sch for TOC

## 2022-06-10 DIAGNOSIS — R400 Somnolence: Secondary | ICD-10-CM | POA: Diagnosis not present

## 2022-06-10 DIAGNOSIS — G4733 Obstructive sleep apnea (adult) (pediatric): Secondary | ICD-10-CM | POA: Diagnosis not present

## 2022-06-22 DIAGNOSIS — E119 Type 2 diabetes mellitus without complications: Secondary | ICD-10-CM | POA: Diagnosis not present

## 2022-06-22 LAB — HM DIABETES EYE EXAM

## 2022-06-23 ENCOUNTER — Encounter: Payer: Self-pay | Admitting: Adult Health

## 2022-06-23 ENCOUNTER — Ambulatory Visit (INDEPENDENT_AMBULATORY_CARE_PROVIDER_SITE_OTHER): Payer: PPO | Admitting: Adult Health

## 2022-06-23 VITALS — BP 120/80 | HR 103 | Temp 98.0°F | Ht 74.0 in | Wt 286.0 lb

## 2022-06-23 DIAGNOSIS — K219 Gastro-esophageal reflux disease without esophagitis: Secondary | ICD-10-CM | POA: Diagnosis not present

## 2022-06-23 DIAGNOSIS — I480 Paroxysmal atrial fibrillation: Secondary | ICD-10-CM

## 2022-06-23 DIAGNOSIS — R7989 Other specified abnormal findings of blood chemistry: Secondary | ICD-10-CM | POA: Diagnosis not present

## 2022-06-23 DIAGNOSIS — I10 Essential (primary) hypertension: Secondary | ICD-10-CM

## 2022-06-23 DIAGNOSIS — G6289 Other specified polyneuropathies: Secondary | ICD-10-CM | POA: Diagnosis not present

## 2022-06-23 DIAGNOSIS — I2510 Atherosclerotic heart disease of native coronary artery without angina pectoris: Secondary | ICD-10-CM

## 2022-06-23 DIAGNOSIS — E785 Hyperlipidemia, unspecified: Secondary | ICD-10-CM

## 2022-06-23 DIAGNOSIS — E0500 Thyrotoxicosis with diffuse goiter without thyrotoxic crisis or storm: Secondary | ICD-10-CM

## 2022-06-23 DIAGNOSIS — E1142 Type 2 diabetes mellitus with diabetic polyneuropathy: Secondary | ICD-10-CM | POA: Diagnosis not present

## 2022-06-23 DIAGNOSIS — Z7689 Persons encountering health services in other specified circumstances: Secondary | ICD-10-CM | POA: Diagnosis not present

## 2022-06-23 DIAGNOSIS — R7303 Prediabetes: Secondary | ICD-10-CM | POA: Insufficient documentation

## 2022-06-23 MED ORDER — TIRZEPATIDE 2.5 MG/0.5ML ~~LOC~~ SOAJ: 2.5000 mg | SUBCUTANEOUS | 0 refills | Status: DC

## 2022-06-23 MED ORDER — BLOOD GLUCOSE MONITOR KIT: PACK | 0 refills | Status: DC
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
## 2022-06-23 NOTE — Patient Instructions (Addendum)
  It was great meeting you today   I have sent in [employer] to see if it is covered  Please follow up in a month to repeat your A1c

## 2022-06-24 NOTE — Progress Notes (Deleted)
CARDIOLOGY CONSULT NOTE       Patient ID: Jeffrey Price MRN: [id number] DOB/AGE: 1955/11/13 66 y.o.   Primary Physician: Jeffrey Peng, NP Primary Cardiologist: Jeffrey Price Jeffrey Price     HPI:  66 y.o. with history of HTN, varicose veins, HLD, low thyroid . Noted new onset afib 10/19/21. TTE with EF 60-65% moderate LVH mild LAE AV sclerosis Started on xarelto 10/21/21 Jeffrey Price 3  Cardioverted by myself on 12/22/21 with TEE Seen by PA 01/12/22 back in afib Myovue non ischemic EF estimated 37% not accurate due to afib. CTA normal PVls no LAA thrombus calcium score 315 which is 75 th percentile done on 05/04/22 . Scheduled for ablation 05/07/22 but cancelled due to URI and fever ? Rescheduled for 09/14/22   Sees Jeffrey Price for CPAP and OSA   ***  ROS All other systems reviewed and negative except as noted above  Past Medical History:  Diagnosis Date   Arthritis    in back   Blood clot in vein 1995   bil legs   Bulging lumbar disc    Cataract    mild   Cellulitis 2012   forehead   Complication of anesthesia    had trouble waking up 06/11/2020   DDD (degenerative disc disease), lumbar    Diabetes mellitus without complication (HCC)    GERD (gastroesophageal reflux disease)    Graves disease    Hx of adenomatous colonic polyps 12/15/2017   Hyperlipidemia    Hypertension    Neuromuscular disorder (HCC)    neuropathy in feet   Thyrotoxicosis without thyroid storm    Varicose veins of both lower extremities     Family History  Problem Relation Age of Onset   Thyroid disease Mother    Parkinson's disease Father    Colon cancer Neg Hx    Rectal cancer Neg Hx    Stomach cancer Neg Hx    Esophageal cancer Neg Hx     Social History   Socioeconomic History   Marital status: Significant Other    Spouse name: Not on file   Number of children: 1   Years of education: Not on file   Highest education level: Some college, no degree  Occupational History   Occupation: Retiring   Tobacco Use   Smoking status: Former    Types: E-cigarettes, Cigarettes    Quit date: 12/03/2011    Years since quitting: 10.5   Smokeless tobacco: Never   Tobacco comments:    occasional vaping; + OSA  Vaping Use   Vaping Use: Every day   Substances: Nicotine, Flavoring  Substance and Sexual Activity   Alcohol use: Yes    Comment: whiskey, Burbon, as few drinks per night and more on weekends   Drug use: No   Sexual activity: Not on file  Other Topics Concern   Not on file  Social History Narrative   Patient is widowed.    He has girlfriend.   Right-handed.   Caffeine use: 2-3 cups per day.      Social Determinants of Health   Financial Resource Strain: Low Risk  (10/09/2021)   Overall Financial Resource Strain (CARDIA)    Difficulty of Paying Living Expenses: Not hard at all  Food Insecurity: No Food Insecurity (10/09/2021)   Hunger Vital Sign    Worried About Running Out of Food in the Last Year: Never true    Ran Out of Food in the Last Year: Never true  Transportation Needs: No  Transportation Needs (10/09/2021)   PRAPARE - Hydrologist (Medical): No    Lack of Transportation (Non-Medical): No  Physical Activity: Unknown (10/09/2021)   Exercise Vital Sign    Days of Exercise per Week: 0 days    Minutes of Exercise per Session: Not on file  Stress: Stress Concern Present (10/09/2021)   [place]    Feeling of Stress : To some extent  Social Connections: Socially Isolated (10/09/2021)   Social Connection and Isolation Panel [NHANES]    Frequency of Communication with Friends and Family: More than three times a week    Frequency of Social Gatherings with Friends and Family: Once a week    Attends Religious Services: Never    Marine scientist or Organizations: No    Attends Music therapist: Not on file    Marital Status: Widowed  Intimate Partner Violence:  Not on file    Past Surgical History:  Procedure Laterality Date   ANTERIOR CERVICAL DECOMP/DISCECTOMY FUSION N/A 06/18/2021   Procedure: ANTERIOR CERVICAL DECOMPRESSION FUSION CERVICAL4- CERVICAL 5 WITH INSTRUEMENTATION AND ALLOGRAFT;  Surgeon: Jeffrey Bob, Price;  Location: [facility];  Service: Orthopedics;  Laterality: N/A;   BACK SURGERY  06/11/2020   CARDIOVERSION N/A 12/22/2021   Procedure: CARDIOVERSION;  Surgeon: Jeffrey Hector, Price;  Location: [facility];  Service: Cardiovascular;  Laterality: N/A;   ENDOVENOUS ABLATION SAPHENOUS VEIN W/ LASER Left 10/18/2019   endovenous laser ablation left greater saphenous vein and stab phlebectomy > 20 incisions left leg by Jeffrey Price    ENDOVENOUS ABLATION SAPHENOUS VEIN W/ LASER Right 11/08/2019   endovenous laser ablation right greater saphenous vein and stab phlebectomy 10-20 incisions right leg by Jeffrey Price    LUMBAR EPIDURAL INJECTION     TEE WITHOUT CARDIOVERSION N/A 12/22/2021   Procedure: TRANSESOPHAGEAL ECHOCARDIOGRAM (TEE);  Surgeon: Jeffrey Hector, Price;  Location: [facility];  Service: Cardiovascular;  Laterality: N/A;      Current Outpatient Medications:    acetaminophen (TYLENOL) 325 MG tablet, Take 2 tablets (650 mg total) by mouth every 6 (six) hours as needed for mild pain (or Fever >/= 101)., Disp: , Rfl:    atenolol (TENORMIN) 50 MG tablet, Take 1 tablet (50 mg total) by mouth daily., Disp: 90 tablet, Rfl: 2   atorvastatin (LIPITOR) 20 MG tablet, Take 1 tablet (20 mg total) by mouth daily., Disp: 90 tablet, Rfl: 3   blood glucose meter kit and supplies KIT, Dispense based on patient and insurance preference. Use up to four times daily as directed., Disp: 1 each, Rfl: 0   cetirizine (ZYRTEC) 10 MG tablet, Take 10 mg by mouth daily as needed for allergies., Disp: , Rfl:    clotrimazole (CLOTRIMAZOLE AF) 1 % cream, Apply topically twice daily as needed (rash)., Disp: 45 g, Rfl: 2   Cyanocobalamin (B-12 SL), Place 1  tablet under the tongue daily., Disp: , Rfl:    cyclobenzaprine (FLEXERIL) 10 MG tablet, Take 1 tablet (10 mg total) by mouth 2 (two) times daily as needed for muscle spasms., Disp: 90 tablet, Rfl: 3   folic acid (FOLVITE) 1 MG tablet, Take 1 tablet (1 mg total) by mouth daily., Disp: , Rfl:    furosemide (LASIX) 80 MG tablet, Take 1 tablet by mouth 2 times daily as needed., Disp: 180 tablet, Rfl: 3   gabapentin (NEURONTIN) 100 MG capsule, Take 1 - 3 capsules by  mouth 3 times daily as needed for neuropathic pain. (in addition to the 800 mg tablets), Disp: 270 capsule, Rfl: 1   gabapentin (NEURONTIN) 800 MG tablet, Take 1 tablet (800 mg total) by mouth 3 (three) times daily., Disp: 270 tablet, Rfl: 1   meloxicam (MOBIC) 15 MG tablet, TAKE 1 TABLET BY MOUTH ONCE DAILY AS NEEDED FOR INFLAMMATION, Disp: 90 tablet, Rfl: 1   metFORMIN (GLUCOPHAGE) 500 MG tablet, Take 1 tablet (500 mg total) by mouth 2 (two) times daily with a meal., Disp: 180 tablet, Rfl: 1   methimazole (TAPAZOLE) 5 MG tablet, Take 1 tablet (5 mg total) by mouth every morning., Disp: 90 tablet, Rfl: 1   metoprolol tartrate (LOPRESSOR) 100 MG tablet, Take 1 tablet (100 mg total) by mouth once for 1 dose. Please take one time dose 186m metoprolol tartrate 2 hr prior to cardiac CT for HR control IF HR >55bpm., Disp: 1 tablet, Rfl: 0   pantoprazole (PROTONIX) 40 MG tablet, TAKE 1 TABLET BY MOUTH DAILY * PT NEEDS APPT, Disp: 90 tablet, Rfl: 1   rivaroxaban (XARELTO) 20 MG TABS tablet, TAKE 1 TABLET BY MOUTH ONCE DAILY WITH SUPPER, Disp: 90 tablet, Rfl: 1   sildenafil (VIAGRA) 100 MG tablet, Take 1/2-1 tablet by mouth daily as needed for erectile dysfunction., Disp: 30 tablet, Rfl: 2   tirzepatide (MOUNJARO) 2.5 MG/0.5ML Pen, Inject 2.5 mg into the skin once a week., Disp: 2 mL, Rfl: 0   tretinoin (RETIN-A) 0.05 % cream, Apply 1 application. topically daily as needed (oil skin)., Disp: 45 g, Rfl: 3   triamcinolone cream (KENALOG) 0.1 %, Apply  to affected area(s) twice daily for 14 days (Patient taking differently: 1 Application 2 (two) times daily as needed (irritation).), Disp: 45 g, Rfl: 0    Physical Exam: There were no vitals taken for this visit.    Affect appropriate Healthy:  appears stated age H3 normal Neck supple with no adenopathy JVP normal no bruits no thyromegaly Lungs clear with no wheezing and good diaphragmatic motion Heart:  S1/S2 no murmur, no rub, Price or click PMI normal Abdomen: benighn, BS positve, no tenderness, no AAA no bruit.  No HSM or HJR Distal pulses intact with no bruits No edema Neuro non-focal Skin warm and dry No muscular weakness   Labs:   Lab Results  Component Value Date   WBC 7.0 04/29/2022   HGB 13.7 04/29/2022   HCT 39.7 04/29/2022   MCV 91 04/29/2022   PLT 220 04/29/2022   No results for input(s): "NA", "K", "CL", "CO2", "BUN", "CREATININE", "CALCIUM", "PROT", "BILITOT", "ALKPHOS", "ALT", "AST", "GLUCOSE" in the last 168 hours.  Invalid input(s): "LABALBU" No results found for: "CKTOTAL", "CKMB", "CKMBINDEX", "TROPONINI"  Lab Results  Component Value Date   CHOL 102 10/09/2021   CHOL 127 08/08/2020   CHOL 111 03/07/2020   Lab Results  Component Value Date   HDL 35.60 (L) 10/09/2021   HDL 31 (L) 08/08/2020   HDL 26.30 (L) 03/07/2020   Lab Results  Component Value Date   LDLCALC 40 10/09/2021   [place] 08/08/2020     Comment:     . LDL cholesterol not calculated. Triglyceride levels greater than 400 mg/dL invalidate calculated LDL results. . Reference range: <100 . Desirable range <100 mg/dL for primary prevention;   <70 mg/dL for patients with CHD or diabetic patients  with > or = 2 CHD risk factors. .Marland KitchenLDL-C is now calculated using the Martin-Hopkins  calculation, which is  a validated novel method providing  better accuracy than the Friedewald equation in the  estimation of LDL-C.  Cresenciano Genre et al. Annamaria Helling. 5501;586(82): 2061-2068   (http://education.[facility].com/faq/FAQ164)    LDLCALC 53 03/07/2020   Lab Results  Component Value Date   TRIG 136.0 10/09/2021   TRIG 402 (H) 08/08/2020   TRIG 160.0 (H) 03/07/2020   Lab Results  Component Value Date   CHOLHDL 3 10/09/2021   CHOLHDL 4.1 08/08/2020   CHOLHDL 4 03/07/2020   No results found for: "LDLDIRECT"    Radiology: No results found.  EKG: 01/12/22  Afib RAD rate 95 bpm   ASSESSMENT AND PLAN:   PAF:  [facility] 12/22/21 with ERAF seen by EP Camnitz pending ablation continue Atenolol and Xarelto  CAD: high calcium score on PV CTA 315 75 th percentile non ischemic myovue 01/20/22  OSA:  continue CPAP f/u Jeffrey Price  Thyroid:  TSH normal on Tapazole and beta blocker DM:  Discussed low carb diet.  Target hemoglobin A1c is 6.5 or less.  Continue current medications. HLD:  continue statin LDL 40   F/U EP Camnitz Ablation F/U general cards in a year   Signed: Jenkins Rouge 06/24/2022, 3:54 PM

## 2022-07-01 ENCOUNTER — Other Ambulatory Visit: Payer: Self-pay

## 2022-07-01 ENCOUNTER — Encounter: Payer: Self-pay | Admitting: Cardiovascular Disease

## 2022-07-03 ENCOUNTER — Other Ambulatory Visit (HOSPITAL_COMMUNITY): Payer: Self-pay

## 2022-07-05 ENCOUNTER — Ambulatory Visit: Payer: PPO | Admitting: Cardiovascular Disease

## 2022-07-11 DIAGNOSIS — G4733 Obstructive sleep apnea (adult) (pediatric): Secondary | ICD-10-CM | POA: Diagnosis not present

## 2022-07-11 DIAGNOSIS — R400 Somnolence: Secondary | ICD-10-CM | POA: Diagnosis not present

## 2022-07-19 ENCOUNTER — Other Ambulatory Visit (HOSPITAL_COMMUNITY): Payer: Self-pay

## 2022-07-19 ENCOUNTER — Other Ambulatory Visit: Payer: Self-pay | Admitting: Adult Health

## 2022-07-19 DIAGNOSIS — E1140 Type 2 diabetes mellitus with diabetic neuropathy, unspecified: Secondary | ICD-10-CM

## 2022-07-20 MED ORDER — METFORMIN HCL 500 MG PO TABS
500.0000 mg | ORAL_TABLET | Freq: Two times a day (BID) | ORAL | 1 refills | Status: DC
  Filled 2022-07-20: qty 180, 90d supply, fill #0
  Filled 2022-09-10: qty 180, 90d supply, fill #1

## 2022-07-21 ENCOUNTER — Other Ambulatory Visit (HOSPITAL_COMMUNITY): Payer: Self-pay

## 2022-07-21 ENCOUNTER — Encounter (HOSPITAL_COMMUNITY): Payer: Self-pay | Admitting: Nurse Practitioner

## 2022-07-21 ENCOUNTER — Ambulatory Visit (HOSPITAL_COMMUNITY)
Admission: RE | Admit: 2022-07-21 | Discharge: 2022-07-21 | Disposition: A | Discharge status: Home / Self Care | Payer: PPO | Source: Ambulatory Visit | Attending: Nurse Practitioner | Admitting: Nurse Practitioner

## 2022-07-21 VITALS — BP 124/74 | HR 87 | Ht 74.0 in | Wt 286.4 lb

## 2022-07-21 DIAGNOSIS — Z87891 Personal history of nicotine dependence: Secondary | ICD-10-CM | POA: Insufficient documentation

## 2022-07-21 DIAGNOSIS — Z7901 Long term (current) use of anticoagulants: Secondary | ICD-10-CM | POA: Insufficient documentation

## 2022-07-21 DIAGNOSIS — I4891 Unspecified atrial fibrillation: Secondary | ICD-10-CM | POA: Diagnosis not present

## 2022-07-21 DIAGNOSIS — Z79899 Other long term (current) drug therapy: Secondary | ICD-10-CM | POA: Insufficient documentation

## 2022-07-21 DIAGNOSIS — E079 Disorder of thyroid, unspecified: Secondary | ICD-10-CM | POA: Insufficient documentation

## 2022-07-21 DIAGNOSIS — D6869 Other thrombophilia: Secondary | ICD-10-CM | POA: Diagnosis not present

## 2022-07-21 DIAGNOSIS — I4819 Other persistent atrial fibrillation: Secondary | ICD-10-CM | POA: Diagnosis not present

## 2022-07-21 DIAGNOSIS — I2510 Atherosclerotic heart disease of native coronary artery without angina pectoris: Secondary | ICD-10-CM | POA: Diagnosis not present

## 2022-07-21 MED ORDER — ATENOLOL 50 MG PO TABS
ORAL_TABLET | ORAL | 2 refills | Status: DC
Start: 2022-07-21 — End: 2022-10-19

## 2022-07-21 NOTE — Patient Instructions (Signed)
Increase atenolol to '50mg'$  in the morning and '25mg'$  in the evening   Call in 10 days with update of your response to medication change - Marzetta Board [phone]

## 2022-07-21 NOTE — Progress Notes (Signed)
Primary Care Physician: Dorothyann Peng, NP Referring Physician: Dr. Raynald Blend Jeffrey Price is a 66 y.o. male with a h/o persistent afib that was scheduled to have an ablation in August but got sick and it was postponed until 11/7. He was advised to cone to afib clinic to discuss meds that may help restore SR until then.  He states that at rest he feels ok. Mostly when he is symptomatic is with walking a fair distance.   We  discussed meds that may help to restore SR as a bridge to ablation. He does  have thyroid disease, on tapazole, so I think amiodarone would not be best option.  Pt does not want to pursue Tikosyn for the necessary hospitalization. He does have multivessel CAD by chest CT. Recent stress test did not show any ischemia. Multaq would be price prohibitive for him as he is in the donut hole. Use of flecainide would probably entail another cardioversion and he does not know if he wants to go thru with another cardioversion. We also discussed additional dose of atenolol as he is just on this once a day and he is in agreement with this.   Today, he denies symptoms of palpitations, chest pain, shortness of breath, orthopnea, PND, lower extremity edema, dizziness, presyncope, syncope, or neurologic sequela. The patient is tolerating medications without difficulties and is otherwise without complaint today.   Past Medical History:  Diagnosis Date   Arthritis    in back   Blood clot in vein 1995   bil legs   Bulging lumbar disc    Cataract    mild   Cellulitis 2012   forehead   Complication of anesthesia    had trouble waking up 06/11/2020   DDD (degenerative disc disease), lumbar    Diabetes mellitus without complication (HCC)    GERD (gastroesophageal reflux disease)    Graves disease    Hx of adenomatous colonic polyps 12/15/2017   Hyperlipidemia    Hypertension    Neuromuscular disorder (HCC)    neuropathy in feet   Thyrotoxicosis without thyroid storm    Varicose  veins of both lower extremities    Past Surgical History:  Procedure Laterality Date   ANTERIOR CERVICAL DECOMP/DISCECTOMY FUSION N/A 06/18/2021   Procedure: ANTERIOR CERVICAL DECOMPRESSION FUSION CERVICAL4- CERVICAL 5 WITH INSTRUEMENTATION AND ALLOGRAFT;  Surgeon: Phylliss Bob, MD;  Location: [facility];  Service: Orthopedics;  Laterality: N/A;   BACK SURGERY  06/11/2020   CARDIOVERSION N/A 12/22/2021   Procedure: CARDIOVERSION;  Surgeon: Josue Hector, MD;  Location: [facility];  Service: Cardiovascular;  Laterality: N/A;   ENDOVENOUS ABLATION SAPHENOUS VEIN W/ LASER Left 10/18/2019   endovenous laser ablation left greater saphenous vein and stab phlebectomy > 20 incisions left leg by Gae Gallop MD    ENDOVENOUS ABLATION SAPHENOUS VEIN W/ LASER Right 11/08/2019   endovenous laser ablation right greater saphenous vein and stab phlebectomy 10-20 incisions right leg by Gae Gallop MD    LUMBAR EPIDURAL INJECTION     TEE WITHOUT CARDIOVERSION N/A 12/22/2021   Procedure: TRANSESOPHAGEAL ECHOCARDIOGRAM (TEE);  Surgeon: Josue Hector, MD;  Location: [facility] END[facility]COPY;  Service: Cardiovascular;  Laterality: N/A;    Current Outpatient Medications  Medication Sig Dispense Refill   acetaminophen (TYLENOL) 325 MG tablet Take 2 tablets (650 mg total) by mouth every 6 (six) hours as needed for mild pain (or Fever >/= 101).     atorvastatin (LIPITOR) 20 MG tablet Take 1 tablet (20  mg total) by mouth daily. 90 tablet 3   blood glucose meter kit and supplies KIT Dispense based on patient and insurance preference. Use up to four times daily as directed. 1 each 0   cetirizine (ZYRTEC) 10 MG tablet Take 10 mg by mouth daily as needed for allergies.     clotrimazole (CLOTRIMAZOLE AF) 1 % cream Apply topically twice daily as needed (rash). 45 g 2   Cyanocobalamin (B-12 SL) Place 1 tablet under the tongue daily.     cyclobenzaprine (FLEXERIL) 10 MG tablet Take 1 tablet (10 mg total) by mouth 2 (two) times daily  as needed for muscle spasms. 90 tablet 3   folic acid (FOLVITE) 1 MG tablet Take 1 tablet (1 mg total) by mouth daily.     furosemide (LASIX) 80 MG tablet Take 1 tablet by mouth 2 times daily as needed. 180 tablet 3   gabapentin (NEURONTIN) 100 MG capsule Take 1 - 3 capsules by mouth 3 times daily as needed for neuropathic pain. (in addition to the 800 mg tablets) 270 capsule 1   gabapentin (NEURONTIN) 800 MG tablet Take 1 tablet (800 mg total) by mouth 3 (three) times daily. 270 tablet 1   meloxicam (MOBIC) 15 MG tablet TAKE 1 TABLET BY MOUTH ONCE DAILY AS NEEDED FOR INFLAMMATION 90 tablet 1   metFORMIN (GLUCOPHAGE) 500 MG tablet Take 1 tablet (500 mg total) by mouth 2 (two) times daily with a meal. 180 tablet 1   methimazole (TAPAZOLE) 5 MG tablet Take 1 tablet (5 mg total) by mouth every morning. 90 tablet 1   pantoprazole (PROTONIX) 40 MG tablet TAKE 1 TABLET BY MOUTH DAILY * PT NEEDS APPT 90 tablet 1   rivaroxaban (XARELTO) 20 MG TABS tablet TAKE 1 TABLET BY MOUTH ONCE DAILY WITH SUPPER 90 tablet 1   sildenafil (VIAGRA) 100 MG tablet Take 1/2-1 tablet by mouth daily as needed for erectile dysfunction. 30 tablet 2   tretinoin (RETIN-A) 0.05 % cream Apply 1 application. topically daily as needed (oil skin). 45 g 3   triamcinolone cream (KENALOG) 0.1 % Apply to affected area(s) twice daily for 14 days (Patient taking differently: 1 Application 2 (two) times daily as needed (irritation).) 45 g 0   atenolol (TENORMIN) 50 MG tablet Take 1 tablet in the AM and 1/2 tablet in the PM 90 tablet 2   No current facility-administered medications for this encounter.    No Known Allergies  Social History   Socioeconomic History   Marital status: Significant Other    Spouse name: Not on file   Number of children: 1   Years of education: Not on file   Highest education level: Some college, no degree  Occupational History   Occupation: Retiring  Tobacco Use   Smoking status: Former    Types:  E-cigarettes, Cigarettes    Quit date: 12/03/2011    Years since quitting: 10.6   Smokeless tobacco: Never   Tobacco comments:    occasional vaping; + OSA  Vaping Use   Vaping Use: Every day   Substances: Nicotine, Flavoring  Substance and Sexual Activity   Alcohol use: Yes    Comment: whiskey, Burbon, as few drinks per night and more on weekends   Drug use: No   Sexual activity: Not on file  Other Topics Concern   Not on file  Social History Narrative   Patient is widowed.    He has girlfriend.   Right-handed.   Caffeine use: 2-3 cups  per day.      Social Determinants of Health   Financial Resource Strain: Low Risk  (10/09/2021)   Overall Financial Resource Strain (CARDIA)    Difficulty of Paying Living Expenses: Not hard at all  Food Insecurity: No Food Insecurity (10/09/2021)   Hunger Vital Sign    Worried About Running Out of Food in the Last Year: Never true    Ran Out of Food in the Last Year: Never true  Transportation Needs: No Transportation Needs (10/09/2021)   PRAPARE - Hydrologist (Medical): No    Lack of Transportation (Non-Medical): No  Physical Activity: Unknown (10/09/2021)   Exercise Vital Sign    Days of Exercise per Week: 0 days    Minutes of Exercise per Session: Not on file  Stress: Stress Concern Present (10/09/2021)   [place]    Feeling of Stress : To some extent  Social Connections: Socially Isolated (10/09/2021)   Social Connection and Isolation Panel [NHANES]    Frequency of Communication with Friends and Family: More than three times a week    Frequency of Social Gatherings with Friends and Family: Once a week    Attends Religious Services: Never    Marine scientist or Organizations: No    Attends Music therapist: Not on file    Marital Status: Widowed  Human resources officer Violence: Not on file    Family History  Problem Relation  Age of Onset   Thyroid disease Mother    Parkinson's disease Father    Colon cancer Neg Hx    Rectal cancer Neg Hx    Stomach cancer Neg Hx    Esophageal cancer Neg Hx     ROS- All systems are reviewed and negative except as per the HPI above  Physical Exam: Vitals:   07/21/22 1323  BP: 124/74  Pulse: 87  Weight: 129.9 kg  Height: 6' 2"  (1.88 m)   Wt Readings from Last 3 Encounters:  07/21/22 129.9 kg  06/23/22 129.7 kg  05/31/22 128.4 kg    Labs: Lab Results  Component Value Date   NA 136 04/29/2022   K 4.4 04/29/2022   CL 98 04/29/2022   CO2 24 04/29/2022   GLUCOSE 132 (H) 04/29/2022   BUN 16 04/29/2022   CREATININE 0.88 04/29/2022   CALCIUM 9.1 04/29/2022   PHOS 2.9 03/31/2018   Lab Results  Component Value Date   INR 1.1 06/15/2021   Lab Results  Component Value Date   CHOL 102 10/09/2021   HDL 35.60 (L) 10/09/2021   LDLCALC 40 10/09/2021   TRIG 136.0 10/09/2021     GEN- The patient is well appearing, alert and oriented x 3 today.   Head- normocephalic, atraumatic Eyes-  Sclera clear, conjunctiva pink Ears- hearing intact Oropharynx- clear Neck- supple, no JVP Lymph- no cervical lymphadenopathy Lungs- Clear to ausculation bilaterally, normal work of breathing Heart- irregular rate and rhythm, no murmurs, rubs or gallops, PMI not laterally displaced GI- soft, NT, ND, + BS Extremities- no clubbing, cyanosis, or edema MS- no significant deformity or atrophy Skin- no rash or lesion Psych- euthymic mood, full affect Neuro- strength and sensation are intact  EKG-afib at 87 bpm, qrs int 76 ms, qtc 447 ms     Assessment and Plan:  1. Persistent afib  Ablation initially scheduled for August postponed due to pt illness Now rescheduled for 11/7 We discussed antiarrythmic drugs, to  restore SR until ablation For any drug  started, it  would probably  require a cardioversion after loading of drug He cannot  afford Multaq,(in donut hole) amio not a  great option due to baseline thyroid disease and he does not want admission for tikosyn We discussed use of flecainide but he is not very excited re the cardioversion Therefore, he feels he will just stay the same for the time going I will increase atenolol  from 50 mg daily with addition of  25 mg daily in the pm This may lower v rates so walking may feel more comfortable for pt He will try this change and call back to the clinic with his results  Continue with xarelto 20 mg daily for a CHA2DS2VASc  score of 4.  Otherwise continue with plans for ablation 11/7.  Geroge Baseman Caroll Weinheimer, [facility] [address] [place], [place] [address] [phone]

## 2022-07-22 ENCOUNTER — Telehealth: Payer: Self-pay | Admitting: Pharmacist

## 2022-07-22 DIAGNOSIS — G4733 Obstructive sleep apnea (adult) (pediatric): Secondary | ICD-10-CM | POA: Diagnosis not present

## 2022-07-22 DIAGNOSIS — Z9229 Personal history of other drug therapy: Secondary | ICD-10-CM

## 2022-07-22 NOTE — Progress Notes (Signed)
[facility])                                            [place] Team                                        Statin Quality Measure Assessment    07/22/2022  Jeffrey Price 1956/04/11 [id number]   Per review of chart and payor information, patient has a diagnosis of diabetes but is not currently filling a statin prescription.  This places patient into the SUPD (Statin Use In Patients with Diabetes) measure for CMS.    Atorvastatin is on the patient's medication list  but it does not appear to have been filled since December of 2022.  Patient was called to discuss but he did not answer. HIPAA compliant message was left on his phone.  He has an upcoming PCP appointment on 07/23/22.  If deemed therapeutically appropriate, statin therapy could be evaluated during that appointment.  The ASCVD Risk score (Arnett DK, et al., 2019) failed to calculate for the following reasons:   The valid total cholesterol range is 130 to 320 mg/dL 10/09/2021     Component Value Date/Time   CHOL 102 10/09/2021 1251   CHOL 151 11/29/2018 1715   TRIG 136.0 10/09/2021 1251   HDL 35.60 (L) 10/09/2021 1251   HDL 33 (L) 11/29/2018 1715   CHOLHDL 3 10/09/2021 1251   VLDL 27.2 10/09/2021 1251   LDLCALC 40 10/09/2021 1251   LDLCALC  08/08/2020 1036     Comment:     . LDL cholesterol not calculated. Triglyceride levels greater than 400 mg/dL invalidate calculated LDL results. . Reference range: <100 . Desirable range <100 mg/dL for primary prevention;   <70 mg/dL for patients with CHD or diabetic patients  with > or = 2 CHD risk factors. Marland Kitchen LDL-C is now calculated using the Martin-Hopkins  calculation, which is a validated novel method providing  better accuracy than the Friedewald equation in the  estimation of LDL-C.  Cresenciano Genre et al. Annamaria Helling. 5277;824(23): 2061-2068  (http://education.[facility].com/faq/FAQ164)     Please consider  ONE of the following recommendations:  Initiate high intensity statin Atorvastatin '40mg'$  once daily, #90, 3 refills   Rosuvastatin '20mg'$  once daily, #90, 3 refills    Initiate moderate intensity          statin with reduced frequency if prior          statin intolerance 1x weekly, #13, 3 refills   2x weekly, #26, 3 refills   3x weekly, #39, 3 refills    Code for past statin intolerance or  other exclusions (required annually)   Provider Requirements:  Associate code during an office visit or telehealth encounter  Drug Induced Myopathy G72.0   Myopathy, unspecified G72.9   Myositis, unspecified M60.9   Rhabdomyolysis N36.14   Alcoholic fatty liver E31.5   Cirrhosis of liver K74.69   Prediabetes R73.03   PCOS E28.2   Toxic liver disease, unspecified K71.9         Plan: Route note to PCP prior to upcoming appointment.   Elayne Guerin, PharmD, [place] Clinical Pharmacist [phone]

## 2022-07-23 ENCOUNTER — Ambulatory Visit (INDEPENDENT_AMBULATORY_CARE_PROVIDER_SITE_OTHER): Payer: PPO | Admitting: Adult Health

## 2022-07-23 ENCOUNTER — Encounter: Payer: Self-pay | Admitting: Adult Health

## 2022-07-23 VITALS — BP 120/60 | HR 95 | Temp 97.9°F | Ht 74.0 in | Wt 284.0 lb

## 2022-07-23 DIAGNOSIS — I10 Essential (primary) hypertension: Secondary | ICD-10-CM

## 2022-07-23 DIAGNOSIS — E1142 Type 2 diabetes mellitus with diabetic polyneuropathy: Secondary | ICD-10-CM | POA: Diagnosis not present

## 2022-07-23 DIAGNOSIS — Z23 Encounter for immunization: Secondary | ICD-10-CM

## 2022-07-23 LAB — POCT GLYCOSYLATED HEMOGLOBIN (HGB A1C): Hemoglobin A1C: 6.6 % — AB (ref 4.0–5.6)

## 2022-07-23 MED ORDER — EMPAGLIFLOZIN 10 MG PO TABS: 10.0000 mg | ORAL_TABLET | Freq: Every day | ORAL | 1 refills | Status: DC

## 2022-07-23 NOTE — Patient Instructions (Signed)
Your A1c was 6.6 - this has improved  Follow up after December 2nd for your physical exam

## 2022-07-23 NOTE — Progress Notes (Signed)
Subjective:    Patient ID: Jeffrey Price, male    DOB: 01-24-1956, 66 y.o.   MRN: [id number]  HPI 66 year old male who  has a past medical history of Arthritis, Blood clot in vein (1995), Bulging lumbar disc, Cataract, Cellulitis (0814), Complication of anesthesia, DDD (degenerative disc disease), lumbar, Diabetes mellitus without complication ([place]), GERD (gastroesophageal reflux disease), Graves disease, adenomatous colonic polyps (12/15/2017), Hyperlipidemia, Hypertension, Neuromuscular disorder ([place]), Thyrotoxicosis without thyroid storm, and Varicose veins of both lower extremities.  He presents to the office today for follow-up regarding diabetes and hypertension  Dm Type 2 -managed with metformin 500 mg twice daily. In th past he was on Jardiance but his insurance would not cover it. We tried to get him Darcel Bayley but this was not covered either. He is eating healthier and trying to stay active  Lab Results  Component Value Date   HGBA1C 6.8 (H) 04/12/2022   Wt Readings from Last 3 Encounters:  07/23/22 284 lb (128.8 kg)  07/21/22 286 lb 6.4 oz (129.9 kg)  06/23/22 286 lb (129.7 kg)    HTN -managed  with atenolol 50 mg in the morning and 25 mg in the morning.   He denies dizziness, lightheadedness, chest pain, or shortness of breath BP Readings from Last 3 Encounters:  07/21/22 124/74  06/23/22 120/80  05/31/22 133/68    Review of Systems See HPI   Past Medical History:  Diagnosis Date   Arthritis    in back   Blood clot in vein 1995   bil legs   Bulging lumbar disc    Cataract    mild   Cellulitis 2012   forehead   Complication of anesthesia    had trouble waking up 06/11/2020   DDD (degenerative disc disease), lumbar    Diabetes mellitus without complication (HCC)    GERD (gastroesophageal reflux disease)    Graves disease    Hx of adenomatous colonic polyps 12/15/2017   Hyperlipidemia    Hypertension    Neuromuscular disorder (HCC)    neuropathy in feet    Thyrotoxicosis without thyroid storm    Varicose veins of both lower extremities     Social History   Socioeconomic History   Marital status: Significant Other    Spouse name: Not on file   Number of children: 1   Years of education: Not on file   Highest education level: Some college, no degree  Occupational History   Occupation: Retiring  Tobacco Use   Smoking status: Former    Types: E-cigarettes, Cigarettes    Quit date: 12/03/2011    Years since quitting: 10.6   Smokeless tobacco: Never   Tobacco comments:    occasional vaping; + OSA  Vaping Use   Vaping Use: Every day   Substances: Nicotine, Flavoring  Substance and Sexual Activity   Alcohol use: Yes    Comment: whiskey, Burbon, as few drinks per night and more on weekends   Drug use: No   Sexual activity: Not on file  Other Topics Concern   Not on file  Social History Narrative   Patient is widowed.    He has girlfriend.   Right-handed.   Caffeine use: 2-3 cups per day.      Social Determinants of Health   Financial Resource Strain: Low Risk  (10/09/2021)   Overall Financial Resource Strain (CARDIA)    Difficulty of Paying Living Expenses: Not hard at all  Food Insecurity: No Food Insecurity (10/09/2021)  Hunger Vital Sign    Worried About Running Out of Food in the Last Year: Never true    Ran Out of Food in the Last Year: Never true  Transportation Needs: No Transportation Needs (10/09/2021)   PRAPARE - Hydrologist (Medical): No    Lack of Transportation (Non-Medical): No  Physical Activity: Unknown (10/09/2021)   Exercise Vital Sign    Days of Exercise per Week: 0 days    Minutes of Exercise per Session: Not on file  Stress: Stress Concern Present (10/09/2021)   [place]    Feeling of Stress : To some extent  Social Connections: Socially Isolated (10/09/2021)   Social Connection and Isolation Panel [NHANES]     Frequency of Communication with Friends and Family: More than three times a week    Frequency of Social Gatherings with Friends and Family: Once a week    Attends Religious Services: Never    Marine scientist or Organizations: No    Attends Music therapist: Not on file    Marital Status: Widowed  Intimate Partner Violence: Not on file    Past Surgical History:  Procedure Laterality Date   ANTERIOR CERVICAL DECOMP/DISCECTOMY FUSION N/A 06/18/2021   Procedure: ANTERIOR CERVICAL DECOMPRESSION FUSION CERVICAL4- CERVICAL 5 WITH INSTRUEMENTATION AND ALLOGRAFT;  Surgeon: Phylliss Bob, MD;  Location: [facility];  Service: Orthopedics;  Laterality: N/A;   BACK SURGERY  06/11/2020   CARDIOVERSION N/A 12/22/2021   Procedure: CARDIOVERSION;  Surgeon: Josue Hector, MD;  Location: [facility];  Service: Cardiovascular;  Laterality: N/A;   ENDOVENOUS ABLATION SAPHENOUS VEIN W/ LASER Left 10/18/2019   endovenous laser ablation left greater saphenous vein and stab phlebectomy > 20 incisions left leg by Gae Gallop MD    ENDOVENOUS ABLATION SAPHENOUS VEIN W/ LASER Right 11/08/2019   endovenous laser ablation right greater saphenous vein and stab phlebectomy 10-20 incisions right leg by Gae Gallop MD    LUMBAR EPIDURAL INJECTION     TEE WITHOUT CARDIOVERSION N/A 12/22/2021   Procedure: TRANSESOPHAGEAL ECHOCARDIOGRAM (TEE);  Surgeon: Josue Hector, MD;  Location: [facility];  Service: Cardiovascular;  Laterality: N/A;    Family History  Problem Relation Age of Onset   Thyroid disease Mother    Parkinson's disease Father    Colon cancer Neg Hx    Rectal cancer Neg Hx    Stomach cancer Neg Hx    Esophageal cancer Neg Hx     No Known Allergies  Current Outpatient Medications on File Prior to Visit  Medication Sig Dispense Refill   acetaminophen (TYLENOL) 325 MG tablet Take 2 tablets (650 mg total) by mouth every 6 (six) hours as needed for mild pain (or Fever >/= 101).      atenolol (TENORMIN) 50 MG tablet Take 1 tablet in the AM and 1/2 tablet in the PM 90 tablet 2   atorvastatin (LIPITOR) 20 MG tablet Take 1 tablet (20 mg total) by mouth daily. 90 tablet 3   blood glucose meter kit and supplies KIT Dispense based on patient and insurance preference. Use up to four times daily as directed. 1 each 0   cetirizine (ZYRTEC) 10 MG tablet Take 10 mg by mouth daily as needed for allergies.     clotrimazole (CLOTRIMAZOLE AF) 1 % cream Apply topically twice daily as needed (rash). 45 g 2   Cyanocobalamin (B-12 SL) Place 1 tablet under the tongue daily.  cyclobenzaprine (FLEXERIL) 10 MG tablet Take 1 tablet (10 mg total) by mouth 2 (two) times daily as needed for muscle spasms. 90 tablet 3   folic acid (FOLVITE) 1 MG tablet Take 1 tablet (1 mg total) by mouth daily.     furosemide (LASIX) 80 MG tablet Take 1 tablet by mouth 2 times daily as needed. 180 tablet 3   gabapentin (NEURONTIN) 100 MG capsule Take 1 - 3 capsules by mouth 3 times daily as needed for neuropathic pain. (in addition to the 800 mg tablets) 270 capsule 1   gabapentin (NEURONTIN) 800 MG tablet Take 1 tablet (800 mg total) by mouth 3 (three) times daily. 270 tablet 1   meloxicam (MOBIC) 15 MG tablet TAKE 1 TABLET BY MOUTH ONCE DAILY AS NEEDED FOR INFLAMMATION 90 tablet 1   metFORMIN (GLUCOPHAGE) 500 MG tablet Take 1 tablet (500 mg total) by mouth 2 (two) times daily with a meal. 180 tablet 1   methimazole (TAPAZOLE) 5 MG tablet Take 1 tablet (5 mg total) by mouth every morning. 90 tablet 1   pantoprazole (PROTONIX) 40 MG tablet TAKE 1 TABLET BY MOUTH DAILY * PT NEEDS APPT 90 tablet 1   rivaroxaban (XARELTO) 20 MG TABS tablet TAKE 1 TABLET BY MOUTH ONCE DAILY WITH SUPPER 90 tablet 1   sildenafil (VIAGRA) 100 MG tablet Take 1/2-1 tablet by mouth daily as needed for erectile dysfunction. 30 tablet 2   tretinoin (RETIN-A) 0.05 % cream Apply 1 application. topically daily as needed (oil skin). 45 g 3    triamcinolone cream (KENALOG) 0.1 % Apply to affected area(s) twice daily for 14 days (Patient taking differently: 1 Application 2 (two) times daily as needed (irritation).) 45 g 0   No current facility-administered medications on file prior to visit.    BP 120/60   Pulse 95   Temp 97.9 F (36.6 C) (Oral)   Ht 6' 2"  (1.88 m)   Wt 284 lb (128.8 kg)   SpO2 95%   BMI 36.46 kg/m       Objective:   Physical Exam Vitals and nursing note reviewed.  Constitutional:      Appearance: Normal appearance.  Cardiovascular:     Rate and Rhythm: Normal rate and regular rhythm.     Pulses: Normal pulses.     Heart sounds: Normal heart sounds.  Pulmonary:     Effort: Pulmonary effort is normal.     Breath sounds: Normal breath sounds.  Musculoskeletal:        General: Normal range of motion.  Skin:    General: Skin is warm and dry.  Neurological:     General: No focal deficit present.     Mental Status: He is alert and oriented to person, place, and time.  Psychiatric:        Mood and Affect: Mood normal.        Behavior: Behavior normal.        Thought Content: Thought content normal.        Judgment: Judgment normal.       Assessment & Plan:  1. Controlled type 2 diabetes mellitus with diabetic polyneuropathy, without long-term current use of insulin (HCC)  - POC HgB A1c- 6.6 - has improved. He would like to see if Vania Rea is now covered under his insurance.  - He will let me know if he picks up Jardiance.  - Follow up in three months for CPE  - empagliflozin (JARDIANCE) 10 MG TABS tablet; Take 1  tablet (10 mg total) by mouth daily before breakfast.  Dispense: 90 tablet; Refill: 1  2. Essential hypertension - Well controlled.    3. Need for immunization against influenza  - Flu Vaccine QUAD High Dose(Fluad)

## 2022-07-27 ENCOUNTER — Encounter: Payer: Self-pay | Admitting: Adult Health

## 2022-07-27 DIAGNOSIS — E0500 Thyrotoxicosis with diffuse goiter without thyrotoxic crisis or storm: Secondary | ICD-10-CM

## 2022-07-27 DIAGNOSIS — K219 Gastro-esophageal reflux disease without esophagitis: Secondary | ICD-10-CM

## 2022-07-27 MED ORDER — METHIMAZOLE 5 MG PO TABS: 5.0000 mg | ORAL_TABLET | Freq: Every morning | ORAL | 1 refills | Status: DC

## 2022-07-27 MED ORDER — PANTOPRAZOLE SODIUM 40 MG PO TBEC: DELAYED_RELEASE_TABLET | ORAL | 1 refills | Status: DC

## 2022-08-03 ENCOUNTER — Other Ambulatory Visit (HOSPITAL_COMMUNITY): Payer: Self-pay

## 2022-08-10 DIAGNOSIS — R400 Somnolence: Secondary | ICD-10-CM | POA: Diagnosis not present

## 2022-08-10 DIAGNOSIS — G4733 Obstructive sleep apnea (adult) (pediatric): Secondary | ICD-10-CM | POA: Diagnosis not present

## 2022-08-11 ENCOUNTER — Ambulatory Visit: Payer: PPO | Admitting: Cardiology
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
## 2022-08-12 ENCOUNTER — Encounter: Payer: Self-pay | Admitting: Adult Health

## 2022-08-12 ENCOUNTER — Ambulatory Visit: Payer: PPO | Admitting: Adult Health

## 2022-08-12 VITALS — BP 134/84 | HR 84 | Ht 74.0 in | Wt 287.6 lb

## 2022-08-12 DIAGNOSIS — Z9989 Dependence on other enabling machines and devices: Secondary | ICD-10-CM

## 2022-08-12 DIAGNOSIS — G4733 Obstructive sleep apnea (adult) (pediatric): Secondary | ICD-10-CM | POA: Diagnosis not present

## 2022-08-12 NOTE — Progress Notes (Addendum)
CPAP orders on NP's desk for signature. Faxed signed CPAP orders to [facility]. Received confirmation.

## 2022-08-25 ENCOUNTER — Telehealth: Payer: Self-pay | Admitting: *Deleted

## 2022-08-25 NOTE — Telephone Encounter (Signed)
**Note De-Identified Jeffrey Price Obfuscation** The pt and I discussed him enrolling in the [employer] program and he is calling them to enroll today.  He is aware that we are leaving him 2 weeks of Xarelto 20 mg samples in the front office for him to pick up as he is almost out.  He thanked me for calling him back and for our assistance.

## 2022-08-25 NOTE — Telephone Encounter (Signed)
Received a message on the pt that was forwarded stating: [place] Triage (supporting Will Meredith Leeds, MD) 49 minutes ago (8:08 AM)    Hi, I need a refill on Xarelto but I'm in my donut hole and I can't afford $500 plus for 90 day supply. Call or text if you have any alternative meds until my ablation in 2weeks or samples.Thanks  The message was forward to Patient Advocate and Anticoagulation Clinic. Patient Advocate will evaluate for alternative plan and Anticoagulation Clinic evaluate dose for samples if available.   Xarelto '20mg'$  sample request received. Pt is 66 years old, weight-130.5kg, Crea-0.88 on 04/29/2022, last seen by Roderic Palau on 07/21/2022, Diagnosis-Afib, CrCl-154.47 mL/min; Dose is appropriate based on dosing criteria. Xarelto '20mg'$  is correct dose if samples are available.   Will send to refill department and Sherri RN (pt pending afib ablation 09/14/2022) to evaluate for samples. If pt needs to be switched to warfarin per Physician due to no above alternatives being unavailable, please update Anticoagulation Clinic.

## 2022-08-27 ENCOUNTER — Telehealth: Payer: Self-pay

## 2022-08-27 NOTE — Telephone Encounter (Signed)
Pt states he gets his meds [facility]. He will check to see if he's still taking Atrovastatin. Requests this RN to call him back at 1650.

## 2022-08-30 ENCOUNTER — Other Ambulatory Visit: Payer: Self-pay

## 2022-08-30 MED ORDER — RIVAROXABAN 20 MG PO TABS: 20.0000 mg | ORAL_TABLET | Freq: Every day | ORAL | 1 refills | Status: DC

## 2022-08-30 NOTE — Telephone Encounter (Signed)
Prescription refill request for Xarelto received.  Indication: Afib  Last office visit: 9*13/23 Kayleen Memos)  Weight: 130.5kg Age: 66 Scr: 0.88 (04/29/22)  CrCl: 154.40m/min  Appropriate dose and refill sent to requested pharmacy.

## 2022-08-31 ENCOUNTER — Encounter: Payer: Self-pay | Admitting: *Deleted

## 2022-08-31 ENCOUNTER — Telehealth: Payer: Self-pay | Admitting: *Deleted

## 2022-08-31 NOTE — Telephone Encounter (Signed)
Pt informed that he will need TEE prior to ablation (will not repeat CT that was last completed 6/27). Scheduled for TEE 11/3, instructions verbally reviewed and letter of instructions sent via mychart.  Reviewed ablation instructions verbally, also sending via mychart.  Advised to call with questions after reviewing instruction letters. Patient verbalized understanding and agreeable to plan.

## 2022-08-31 NOTE — Telephone Encounter (Signed)
Pt states he is taking Atorvastatin; states he has about 60 pills left.

## 2022-09-10 ENCOUNTER — Other Ambulatory Visit (HOSPITAL_COMMUNITY): Payer: Self-pay

## 2022-09-10 ENCOUNTER — Ambulatory Visit (HOSPITAL_COMMUNITY)
Admission: RE | Admit: 2022-09-10 | Discharge: 2022-09-10 | Disposition: A | Discharge status: Home / Self Care | Payer: PPO | Attending: Internal Medicine | Admitting: Internal Medicine

## 2022-09-10 ENCOUNTER — Encounter (HOSPITAL_COMMUNITY): Payer: Self-pay | Admitting: Internal Medicine

## 2022-09-10 ENCOUNTER — Ambulatory Visit (HOSPITAL_COMMUNITY): Payer: PPO | Admitting: Anesthesiology

## 2022-09-10 ENCOUNTER — Encounter (HOSPITAL_COMMUNITY)
Admission: RE | Disposition: A | Discharge status: Home / Self Care | Payer: Self-pay | Source: Home / Self Care | Attending: Internal Medicine

## 2022-09-10 ENCOUNTER — Other Ambulatory Visit: Payer: Self-pay

## 2022-09-10 ENCOUNTER — Ambulatory Visit (HOSPITAL_BASED_OUTPATIENT_CLINIC_OR_DEPARTMENT_OTHER)
Admission: RE | Admit: 2022-09-10 | Discharge: 2022-09-10 | Disposition: A | Discharge status: Home / Self Care | Payer: PPO | Source: Ambulatory Visit | Attending: Cardiology | Admitting: Cardiology

## 2022-09-10 ENCOUNTER — Ambulatory Visit (HOSPITAL_BASED_OUTPATIENT_CLINIC_OR_DEPARTMENT_OTHER): Payer: PPO | Admitting: Anesthesiology

## 2022-09-10 DIAGNOSIS — Z87891 Personal history of nicotine dependence: Secondary | ICD-10-CM

## 2022-09-10 DIAGNOSIS — J449 Chronic obstructive pulmonary disease, unspecified: Secondary | ICD-10-CM

## 2022-09-10 DIAGNOSIS — I10 Essential (primary) hypertension: Secondary | ICD-10-CM | POA: Diagnosis not present

## 2022-09-10 DIAGNOSIS — Q2112 Patent foramen ovale: Secondary | ICD-10-CM | POA: Insufficient documentation

## 2022-09-10 DIAGNOSIS — E119 Type 2 diabetes mellitus without complications: Secondary | ICD-10-CM | POA: Insufficient documentation

## 2022-09-10 DIAGNOSIS — I4819 Other persistent atrial fibrillation: Secondary | ICD-10-CM | POA: Insufficient documentation

## 2022-09-10 DIAGNOSIS — I480 Paroxysmal atrial fibrillation: Secondary | ICD-10-CM | POA: Diagnosis not present

## 2022-09-10 DIAGNOSIS — G4730 Sleep apnea, unspecified: Secondary | ICD-10-CM | POA: Insufficient documentation

## 2022-09-10 DIAGNOSIS — I2510 Atherosclerotic heart disease of native coronary artery without angina pectoris: Secondary | ICD-10-CM | POA: Diagnosis not present

## 2022-09-10 DIAGNOSIS — K219 Gastro-esophageal reflux disease without esophagitis: Secondary | ICD-10-CM | POA: Insufficient documentation

## 2022-09-10 DIAGNOSIS — G4733 Obstructive sleep apnea (adult) (pediatric): Secondary | ICD-10-CM | POA: Diagnosis not present

## 2022-09-10 DIAGNOSIS — I088 Other rheumatic multiple valve diseases: Secondary | ICD-10-CM | POA: Diagnosis not present

## 2022-09-10 DIAGNOSIS — R400 Somnolence: Secondary | ICD-10-CM | POA: Diagnosis not present

## 2022-09-10 HISTORY — PX: BUBBLE STUDY: SHX6837

## 2022-09-10 HISTORY — PX: TEE WITHOUT CARDIOVERSION: SHX5443

## 2022-09-10 LAB — GLUCOSE, CAPILLARY: Glucose-Capillary: 132 mg/dL — ABNORMAL HIGH (ref 70–99)

## 2022-09-10 SURGERY — ECHOCARDIOGRAM, TRANSESOPHAGEAL
Anesthesia: Monitor Anesthesia Care

## 2022-09-10 MED ORDER — SODIUM CHLORIDE 0.9 % IV SOLN: INTRAVENOUS | Status: DC

## 2022-09-10 MED ORDER — PROPOFOL 500 MG/50ML IV EMUL
INTRAVENOUS | Status: DC | PRN
  Administered 2022-09-10: 125 ug/kg/min via INTRAVENOUS

## 2022-09-10 MED ORDER — LIDOCAINE 2% (20 MG/ML) 5 ML SYRINGE
INTRAMUSCULAR | Status: DC | PRN
  Administered 2022-09-10: 100 mg via INTRAVENOUS

## 2022-09-10 MED ORDER — LACTATED RINGERS IV SOLN: INTRAVENOUS | Status: DC

## 2022-09-10 MED ORDER — PROPOFOL 10 MG/ML IV BOLUS
INTRAVENOUS | Status: DC | PRN
  Administered 2022-09-10: 30 mg via INTRAVENOUS
  Administered 2022-09-10: 40 mg via INTRAVENOUS

## 2022-09-10 MED ORDER — BUTAMBEN-TETRACAINE-BENZOCAINE 2-2-14 % EX AERO
INHALATION_SPRAY | CUTANEOUS | Status: DC | PRN
  Administered 2022-09-10: 2 via TOPICAL

## 2022-09-10 NOTE — Progress Notes (Signed)
  Echocardiogram Echocardiogram Transesophageal has been performed.  Bobbye Charleston 09/10/2022, 11:19 AM

## 2022-09-10 NOTE — Anesthesia Postprocedure Evaluation (Signed)
Anesthesia Post Note  Patient: Jeffrey Price  Procedure(s) Performed: TRANSESOPHAGEAL ECHOCARDIOGRAM (TEE) BUBBLE STUDY     Patient location during evaluation: PACU Anesthesia Type: MAC Level of consciousness: awake and alert Pain management: pain level controlled Vital Signs Assessment: post-procedure vital signs reviewed and stable Respiratory status: spontaneous breathing, nonlabored ventilation, respiratory function stable and patient connected to nasal cannula oxygen Cardiovascular status: stable and blood pressure returned to baseline Postop Assessment: no apparent nausea or vomiting Anesthetic complications: no   No notable events documented.  Last Vitals:  Vitals:   09/10/22 1116 09/10/22 1135  BP: 105/64 127/84  Pulse: (!) 104 91  Resp: (!) 21 14  Temp: 36.7 C   SpO2: 93% 96%    Last Pain:  Vitals:   09/10/22 1135  TempSrc:   PainSc: 0-No pain                 Simone Tuckey S

## 2022-09-10 NOTE — Anesthesia Preprocedure Evaluation (Signed)
Anesthesia Evaluation  Patient identified by MRN, date of birth, ID band Patient awake    Reviewed: Allergy & Precautions, H&P , NPO status , Patient's Chart, lab work & pertinent test results  Airway Mallampati: II   Neck ROM: full    Dental   Pulmonary sleep apnea , COPD, former smoker   breath sounds clear to auscultation       Cardiovascular hypertension, + CAD   Rhythm:irregular Rate:Normal     Neuro/Psych  Neuromuscular disease    GI/Hepatic ,GERD  ,,  Endo/Other  diabetes, Type 2    Renal/GU      Musculoskeletal  (+) Arthritis ,    Abdominal   Peds  Hematology   Anesthesia Other Findings   Reproductive/Obstetrics                             Anesthesia Physical Anesthesia Plan  ASA: 3  Anesthesia Plan: MAC   Post-op Pain Management:    Induction: Inhalational  PONV Risk Score and Plan: 1 and Propofol infusion and Treatment may vary due to age or medical condition  Airway Management Planned: Nasal Cannula  Additional Equipment:   Intra-op Plan:   Post-operative Plan:   Informed Consent: I have reviewed the patients History and Physical, chart, labs and discussed the procedure including the risks, benefits and alternatives for the proposed anesthesia with the patient or authorized representative who has indicated his/her understanding and acceptance.     Dental advisory given  Plan Discussed with: CRNA, Anesthesiologist and Surgeon  Anesthesia Plan Comments:        Anesthesia Quick Evaluation

## 2022-09-10 NOTE — Interval H&P Note (Signed)
History and Physical Interval Note:  09/10/2022 10:47 AM  Jeffrey Price  has presented today for surgery, with the diagnosis of AFIB ABLATION.  The various methods of treatment have been discussed with the patient and family. After consideration of risks, benefits and other options for treatment, the patient has consented to  Procedure(s): TRANSESOPHAGEAL ECHOCARDIOGRAM (TEE) (N/A) as a surgical intervention.  The patient's history has been reviewed, patient examined, no change in status, stable for surgery.  I have reviewed the patient's chart and labs.  Questions were answered to the patient's satisfaction.     Delphine Sizemore A Mussa Groesbeck

## 2022-09-10 NOTE — CV Procedure (Signed)
    TRANSESOPHAGEAL ECHOCARDIOGRAM   NAME:  Jeffrey Price    MRN: [id number] DOB:  1956-01-18    ADMIT DATE: 09/10/2022  INDICATIONS: Persistent AF  PROCEDURE:   Informed consent was obtained prior to the procedure. The risks, benefits and alternatives for the procedure were discussed and the patient comprehended these risks.  Risks include, but are not limited to, cough, sore throat, vomiting, nausea, somnolence, esophageal and stomach trauma or perforation, bleeding, low blood pressure, aspiration, pneumonia, infection, trauma to the teeth and death.    Procedural time out performed. The oropharynx was anesthetized with topical 1% benzocaine.    Anesthesia was administered by Dr. Marcie Bal and team.   The patient's heart rate, blood pressure, and oxygen saturation are monitored continuously during the procedure.  The transesophageal probe was inserted in the esophagus and stomach without difficulty and multiple views were obtained.   COMPLICATIONS:    There were no immediate complications.  KEY FINDINGS:  Patent left atrial appendage. Presence of a PFO.  Full report to follow. Further management per primary team.   Rudean Haskell, MD [place]  11:13 AM

## 2022-09-10 NOTE — Transfer of Care (Signed)
Immediate Anesthesia Transfer of Care Note  Patient: Jeffrey Price  Procedure(s) Performed: TRANSESOPHAGEAL ECHOCARDIOGRAM (TEE) BUBBLE STUDY  Patient Location: Short Stay  Anesthesia Type:MAC  Level of Consciousness: awake and alert   Airway & Oxygen Therapy: Patient Spontanous Breathing  Post-op Assessment: Report given to RN and Post -op Vital signs reviewed and stable  Post vital signs: Reviewed and stable  Last Vitals:  Vitals Value Taken Time  BP    Temp    Pulse    Resp    SpO2      Last Pain:  Vitals:   09/10/22 1029  TempSrc: Temporal  PainSc: 3       Patients Stated Pain Goal: 3 (43/53/91 2258)  Complications: No notable events documented.

## 2022-09-10 NOTE — Discharge Instructions (Signed)

## 2022-09-10 NOTE — Anesthesia Procedure Notes (Signed)
Procedure Name: General with mask airway Date/Time: 09/10/2022 10:53 AM  Performed by: Erick Colace, CRNAPre-anesthesia Checklist: Patient identified, Emergency Drugs available, Suction available and Patient being monitored Patient Re-evaluated:Patient Re-evaluated prior to induction Oxygen Delivery Method: Nasal cannula Preoxygenation: Pre-oxygenation with 100% oxygen Induction Type: IV induction Airway Equipment and Method: Bite block Dental Injury: Teeth and Oropharynx as per pre-operative assessment

## 2022-09-12 ENCOUNTER — Encounter (HOSPITAL_COMMUNITY): Payer: Self-pay | Admitting: Internal Medicine

## 2022-09-13 NOTE — Pre-Procedure Instructions (Signed)
Instructed patient on the following items: Arrival time 1100 Nothing to eat or drink after midnight No meds AM of procedure Responsible person to drive you home and stay with you for 24 hrs  Have you missed any doses of anti-coagulant Xarelto- hasn't missed any doses    

## 2022-09-14 ENCOUNTER — Ambulatory Visit (HOSPITAL_COMMUNITY): Payer: PPO | Admitting: General Practice

## 2022-09-14 ENCOUNTER — Ambulatory Visit (HOSPITAL_BASED_OUTPATIENT_CLINIC_OR_DEPARTMENT_OTHER): Payer: PPO | Admitting: General Practice

## 2022-09-14 ENCOUNTER — Ambulatory Visit (HOSPITAL_COMMUNITY)
Admission: RE | Admit: 2022-09-14 | Discharge: 2022-09-14 | Disposition: A | Discharge status: Home / Self Care | Payer: PPO | Attending: Cardiology | Admitting: Cardiology

## 2022-09-14 ENCOUNTER — Encounter (HOSPITAL_COMMUNITY): Payer: Self-pay | Admitting: Cardiology

## 2022-09-14 ENCOUNTER — Other Ambulatory Visit: Payer: Self-pay

## 2022-09-14 ENCOUNTER — Encounter (HOSPITAL_COMMUNITY)
Admission: RE | Disposition: A | Discharge status: Home / Self Care | Payer: Self-pay | Source: Home / Self Care | Attending: Cardiology

## 2022-09-14 DIAGNOSIS — I2510 Atherosclerotic heart disease of native coronary artery without angina pectoris: Secondary | ICD-10-CM | POA: Insufficient documentation

## 2022-09-14 DIAGNOSIS — E119 Type 2 diabetes mellitus without complications: Secondary | ICD-10-CM | POA: Insufficient documentation

## 2022-09-14 DIAGNOSIS — I4891 Unspecified atrial fibrillation: Secondary | ICD-10-CM | POA: Diagnosis not present

## 2022-09-14 DIAGNOSIS — I10 Essential (primary) hypertension: Secondary | ICD-10-CM | POA: Insufficient documentation

## 2022-09-14 DIAGNOSIS — E669 Obesity, unspecified: Secondary | ICD-10-CM | POA: Diagnosis not present

## 2022-09-14 DIAGNOSIS — Z87891 Personal history of nicotine dependence: Secondary | ICD-10-CM | POA: Insufficient documentation

## 2022-09-14 DIAGNOSIS — Z6836 Body mass index (BMI) 36.0-36.9, adult: Secondary | ICD-10-CM | POA: Insufficient documentation

## 2022-09-14 DIAGNOSIS — J449 Chronic obstructive pulmonary disease, unspecified: Secondary | ICD-10-CM | POA: Insufficient documentation

## 2022-09-14 DIAGNOSIS — E039 Hypothyroidism, unspecified: Secondary | ICD-10-CM | POA: Insufficient documentation

## 2022-09-14 DIAGNOSIS — E785 Hyperlipidemia, unspecified: Secondary | ICD-10-CM | POA: Diagnosis not present

## 2022-09-14 DIAGNOSIS — I480 Paroxysmal atrial fibrillation: Secondary | ICD-10-CM

## 2022-09-14 DIAGNOSIS — I4819 Other persistent atrial fibrillation: Secondary | ICD-10-CM | POA: Insufficient documentation

## 2022-09-14 DIAGNOSIS — G4733 Obstructive sleep apnea (adult) (pediatric): Secondary | ICD-10-CM | POA: Diagnosis not present

## 2022-09-14 HISTORY — PX: ATRIAL FIBRILLATION ABLATION: EP1191

## 2022-09-14 LAB — BASIC METABOLIC PANEL
Anion gap: 11 (ref 5–15)
BUN: 12 mg/dL (ref 8–23)
CO2: 25 mmol/L (ref 22–32)
Calcium: 9.1 mg/dL (ref 8.9–10.3)
Chloride: 102 mmol/L (ref 98–111)
Creatinine, Ser: 0.92 mg/dL (ref 0.61–1.24)
GFR, Estimated: >60 mL/min (ref >60)
Glucose, Bld: 116 mg/dL — ABNORMAL HIGH (ref 70–99)
Potassium: 4 mmol/L (ref 3.5–5.1)
Sodium: 138 mmol/L (ref 135–145)

## 2022-09-14 LAB — CBC
HCT: 39.1 % (ref 39.0–52.0)
Hemoglobin: 12.8 g/dL — ABNORMAL LOW (ref 13.0–17.0)
MCH: 29.8 pg (ref 26.0–34.0)
MCHC: 32.7 g/dL (ref 30.0–36.0)
MCV: 91.1 fL (ref 80.0–100.0)
Platelets: 225 10*3/uL (ref 150–400)
RBC: 4.29 MIL/uL (ref 4.22–5.81)
RDW: 13.5 % (ref 11.5–15.5)
WBC: 7 10*3/uL (ref 4.0–10.5)
nRBC: 0 % (ref 0.0–0.2)

## 2022-09-14 LAB — GLUCOSE, CAPILLARY
Glucose-Capillary: 124 mg/dL — ABNORMAL HIGH (ref 70–99)
Glucose-Capillary: 183 mg/dL — ABNORMAL HIGH (ref 70–99)
Glucose-Capillary: 154 mg/dL — ABNORMAL HIGH (ref 70–99)

## 2022-09-14 SURGERY — ATRIAL FIBRILLATION ABLATION
Anesthesia: General

## 2022-09-14 MED ORDER — HEPARIN (PORCINE) IN NACL 1000-0.9 UT/500ML-% IV SOLN
INTRAVENOUS | Status: AC
  Filled 2022-09-14: qty 500

## 2022-09-14 MED ORDER — PROPOFOL 10 MG/ML IV BOLUS
INTRAVENOUS | Status: DC | PRN
  Administered 2022-09-14: 150 mg via INTRAVENOUS
  Administered 2022-09-14: 50 mg via INTRAVENOUS

## 2022-09-14 MED ORDER — HEPARIN SODIUM (PORCINE) 1000 UNIT/ML IJ SOLN
INTRAMUSCULAR | Status: AC
  Filled 2022-09-14: qty 10

## 2022-09-14 MED ORDER — DOBUTAMINE INFUSION FOR EP/ECHO/NUC (1000 MCG/ML)
INTRAVENOUS | Status: DC | PRN
  Administered 2022-09-14: 20 ug/kg/min via INTRAVENOUS

## 2022-09-14 MED ORDER — MIDAZOLAM HCL 2 MG/2ML IJ SOLN
INTRAMUSCULAR | Status: DC | PRN
  Administered 2022-09-14 (×2): 1 mg via INTRAVENOUS

## 2022-09-14 MED ORDER — SODIUM CHLORIDE 0.9 % IV SOLN: INTRAVENOUS | Status: DC

## 2022-09-14 MED ORDER — DOBUTAMINE INFUSION FOR EP/ECHO/NUC (1000 MCG/ML)
INTRAVENOUS | Status: AC
  Filled 2022-09-14: qty 250

## 2022-09-14 MED ORDER — FENTANYL CITRATE (PF) 250 MCG/5ML IJ SOLN
INTRAMUSCULAR | Status: DC | PRN
  Administered 2022-09-14: 100 ug via INTRAVENOUS

## 2022-09-14 MED ORDER — DEXAMETHASONE SODIUM PHOSPHATE 10 MG/ML IJ SOLN
INTRAMUSCULAR | Status: DC | PRN
  Administered 2022-09-14: 10 mg via INTRAVENOUS

## 2022-09-14 MED ORDER — ONDANSETRON HCL 4 MG/2ML IJ SOLN
INTRAMUSCULAR | Status: DC | PRN
  Administered 2022-09-14: 4 mg via INTRAVENOUS

## 2022-09-14 MED ORDER — HEPARIN (PORCINE) IN NACL 1000-0.9 UT/500ML-% IV SOLN
INTRAVENOUS | Status: DC | PRN
  Administered 2022-09-14 (×4): 500 mL

## 2022-09-14 MED ORDER — SODIUM CHLORIDE 0.9% FLUSH: 3.0000 mL | Freq: Two times a day (BID) | INTRAVENOUS | Status: DC

## 2022-09-14 MED ORDER — ACETAMINOPHEN 325 MG PO TABS
650.0000 mg | ORAL_TABLET | ORAL | Status: DC | PRN
  Administered 2022-09-14: 650 mg via ORAL
  Filled 2022-09-14: qty 2

## 2022-09-14 MED ORDER — FENTANYL CITRATE (PF) 100 MCG/2ML IJ SOLN
25.0000 ug | Freq: Once | INTRAMUSCULAR | Status: AC
  Administered 2022-09-14: 25 ug via INTRAVENOUS

## 2022-09-14 MED ORDER — PROTAMINE SULFATE 10 MG/ML IV SOLN
INTRAVENOUS | Status: DC | PRN
  Administered 2022-09-14: 40 mg via INTRAVENOUS

## 2022-09-14 MED ORDER — SUCCINYLCHOLINE CHLORIDE 200 MG/10ML IV SOSY
PREFILLED_SYRINGE | INTRAVENOUS | Status: DC | PRN
  Administered 2022-09-14: 140 mg via INTRAVENOUS

## 2022-09-14 MED ORDER — HEPARIN SODIUM (PORCINE) 1000 UNIT/ML IJ SOLN
INTRAMUSCULAR | Status: DC | PRN
  Administered 2022-09-14: 15000 [IU] via INTRAVENOUS
  Administered 2022-09-14: 3000 [IU] via INTRAVENOUS
  Administered 2022-09-14: 6000 [IU] via INTRAVENOUS
  Administered 2022-09-14: 2000 [IU] via INTRAVENOUS

## 2022-09-14 MED ORDER — LIDOCAINE 2% (20 MG/ML) 5 ML SYRINGE
INTRAMUSCULAR | Status: DC | PRN
  Administered 2022-09-14: 100 mg via INTRAVENOUS

## 2022-09-14 MED ORDER — FENTANYL CITRATE (PF) 100 MCG/2ML IJ SOLN
INTRAMUSCULAR | Status: AC
  Filled 2022-09-14: qty 2

## 2022-09-14 MED ORDER — SODIUM CHLORIDE 0.9% FLUSH: 3.0000 mL | INTRAVENOUS | Status: DC | PRN

## 2022-09-14 MED ORDER — ONDANSETRON HCL 4 MG/2ML IJ SOLN: 4.0000 mg | Freq: Four times a day (QID) | INTRAMUSCULAR | Status: DC | PRN

## 2022-09-14 MED ORDER — HEPARIN SODIUM (PORCINE) 1000 UNIT/ML IJ SOLN
INTRAMUSCULAR | Status: DC | PRN
  Administered 2022-09-14: 1000 [IU] via INTRAVENOUS

## 2022-09-14 MED ORDER — SODIUM CHLORIDE 0.9 % IV SOLN: 250.0000 mL | INTRAVENOUS | Status: DC | PRN

## 2022-09-14 SURGICAL SUPPLY — 18 items
CATH ABLAT QDOT MICRO BI TC DF (CATHETERS) IMPLANT
CATH OCTARAY 2.0 F 3-3-3-3-3 (CATHETERS) IMPLANT
CATH PIGTAIL STEERABLE D1 8.7 (WIRE) IMPLANT
CATH S-M CIRCA TEMP PROBE (CATHETERS) IMPLANT
CATH SOUNDSTAR ECO 8FR (CATHETERS) IMPLANT
CATH WEB BI DIR CSDF CRV REPRO (CATHETERS) IMPLANT
CLOSURE PERCLOSE PROSTYLE (VASCULAR PRODUCTS) IMPLANT
COVER SWIFTLINK CONNECTOR (BAG) ×1 IMPLANT
PACK EP LATEX FREE (CUSTOM PROCEDURE TRAY) ×1
PACK EP LF (CUSTOM PROCEDURE TRAY) ×1 IMPLANT
PAD DEFIB RADIO PHYSIO CONN (PAD) ×1 IMPLANT
PATCH CARTO3 (PAD) IMPLANT
SHEATH CARTO VIZIGO SM CVD (SHEATH) IMPLANT
SHEATH PINNACLE 7F 10CM (SHEATH) IMPLANT
SHEATH PINNACLE 8F 10CM (SHEATH) IMPLANT
SHEATH PINNACLE 9F 10CM (SHEATH) IMPLANT
SHEATH PROBE COVER 6X72 (BAG) IMPLANT
TUBING SMART ABLATE COOLFLOW (TUBING) IMPLANT

## 2022-09-14 NOTE — Transfer of Care (Signed)
Immediate Anesthesia Transfer of Care Note  Patient: Jeffrey Price  Procedure(s) Performed: ATRIAL FIBRILLATION ABLATION  Patient Location: Cath Lab  Anesthesia Type:General  Level of Consciousness: awake and drowsy  Airway & Oxygen Therapy: Patient Spontanous Breathing and Patient connected to nasal cannula oxygen  Post-op Assessment: Report given to RN and Post -op Vital signs reviewed and stable  Post vital signs: Reviewed and stable  Last Vitals:  Vitals Value Taken Time  BP    Temp    Pulse    Resp    SpO2      Last Pain:  Vitals:   09/14/22 1238  TempSrc:   PainSc: 3       Patients Stated Pain Goal: 3 (86/57/84 6962)  Complications: There were no known notable events for this encounter.

## 2022-09-14 NOTE — Anesthesia Procedure Notes (Signed)
Procedure Name: Intubation Date/Time: 09/14/2022 1:16 PM  Performed by: Dorann Lodge, CRNAPre-anesthesia Checklist: Patient identified, Emergency Drugs available, Suction available and Patient being monitored Patient Re-evaluated:Patient Re-evaluated prior to induction Oxygen Delivery Method: Circle System Utilized Preoxygenation: Pre-oxygenation with 100% oxygen Induction Type: IV induction and Rapid sequence Laryngoscope Size: Mac and 4 Grade View: Grade II Tube type: Oral Tube size: 7.5 mm Number of attempts: 1 Airway Equipment and Method: Stylet Placement Confirmation: ETT inserted through vocal cords under direct vision, positive ETCO2 and breath sounds checked- equal and bilateral Secured at: 23 cm Tube secured with: Tape Dental Injury: Teeth and Oropharynx as per pre-operative assessment

## 2022-09-14 NOTE — Discharge Instructions (Addendum)
Post procedure care instructions No driving for 4 days. No lifting over 5 lbs for 1 week. No vigorous or sexual activity for 1 week. You may return to work/your usual activities on 09/22/22. Keep procedure site clean & dry. If you notice increased pain, swelling, bleeding or pus, call/return!  You may shower after 24 hours, but no soaking in baths/hot tubs/pools for 1 week.     You have an appointment set up with the [facility].  Multiple studies have shown that being followed by a dedicated atrial fibrillation clinic in addition to the standard care you receive from your other physicians improves health. We believe that enrollment in the atrial fibrillation clinic will allow [place] to better care for you.   The phone number to the [facility] is [phone]. The clinic is staffed Monday through Friday from 8:30am to 5pm.  Directions: The clinic is located in the [facility] [place] the hospital at the MAIN ENTRANCE "A", use [address] to the 6th floor.  Registration desk to the right of elevators on 6th floor  If you have any trouble locating the clinic, please don't hesitate to call [phone]. Femoral Site Care This sheet gives you information about how to care for yourself after your procedure. Your health care provider may also give you more specific instructions. If you have problems or questions, contact your health care provider. What can I expect after the procedure?  After the procedure, it is common to have: Bruising that usually fades within 1-2 weeks. Tenderness at the site. Follow these instructions at home: Wound care Follow instructions from your health care provider about how to take care of your insertion site. Make sure you: Wash your hands with soap and water before you change your bandage (dressing). If soap and water are not available, use hand sanitizer. Remove your dressing as told by your health care provider.  In 24 hours Do not take baths, swim, or use a hot tub until your health care provider approves. You may shower 24-48 hours after the procedure or as told by your health care provider. Gently wash the site with plain soap and water. Pat the area dry with a clean towel. Do not rub the site. This may cause bleeding. Do not apply powder or lotion to the site. Keep the site clean and dry. Check your femoral site every day for signs of infection. Check for: Redness, swelling, or pain. Fluid or blood. Warmth. Pus or a bad smell. Activity For the first 2-3 days after your procedure, or as long as directed: Avoid climbing stairs as much as possible. Do not squat. Do not lift anything that is heavier than 10 lb (4.5 kg), or the limit that you are told, until your health care provider says that it is safe. For 5 days Rest as directed. Avoid sitting for a long time without moving. Get up to take short walks every 1-2 hours. Do not drive for 24 hours if you were given a medicine to help you relax (sedative). General instructions Take over-the-counter and prescription medicines only as told by your health care provider. Keep all follow-up visits as told by your health care provider. This is important. Contact a health care provider if you have: A fever or chills. You have redness, swelling, or pain around your insertion site. Get help right away if: The catheter insertion area swells very fast. You pass out. You suddenly start to sweat or your skin gets clammy.  The catheter insertion area is bleeding, and the bleeding does not stop when you hold steady pressure on the area. The area near or just beyond the catheter insertion site becomes pale, cool, tingly, or numb. These symptoms may represent a serious problem that is an emergency. Do not wait to see if the symptoms will go away. Get medical help right away. Call your local emergency services (911 in the [place] Do not drive yourself to the  hospital. Summary After the procedure, it is common to have bruising that usually fades within 1-2 weeks. Check your femoral site every day for signs of infection. Do not lift anything that is heavier than 10 lb (4.5 kg), or the limit that you are told, until your health care provider says that it is safe. This information is not intended to replace advice given to you by your health care provider. Make sure you discuss any questions you have with your health care provider. Document Revised: 11/07/2017 Document Reviewed: 11/07/2017 Elsevier Patient Education  2020 [employer].

## 2022-09-14 NOTE — Anesthesia Postprocedure Evaluation (Signed)
Anesthesia Post Note  Patient: Jeffrey Price  Procedure(s) Performed: ATRIAL FIBRILLATION ABLATION     Patient location during evaluation: PACU Anesthesia Type: General Level of consciousness: awake Pain management: pain level controlled Vital Signs Assessment: post-procedure vital signs reviewed and stable Respiratory status: spontaneous breathing Cardiovascular status: stable Postop Assessment: no apparent nausea or vomiting Anesthetic complications: no   There were no known notable events for this encounter.  Last Vitals:  Vitals:   09/14/22 1600 09/14/22 1616  BP: 137/77 (!) 146/72  Pulse: 90   Resp: 17   Temp:  36.9 C  SpO2: 91%     Last Pain:  Vitals:   09/14/22 1639  TempSrc:   PainSc: 0-No pain                 Milon Dethloff

## 2022-09-14 NOTE — Anesthesia Preprocedure Evaluation (Addendum)
Anesthesia Evaluation  Patient identified by MRN, date of birth, ID band Patient awake    Reviewed: Allergy & Precautions, NPO status   Airway Mallampati: II       Dental   Pulmonary sleep apnea , COPD, former smoker   breath sounds clear to auscultation       Cardiovascular hypertension, + CAD   Rhythm:Regular Rate:Normal     Neuro/Psych    GI/Hepatic ,GERD  ,,  Endo/Other  diabetes    Renal/GU      Musculoskeletal  (+) Arthritis ,    Abdominal   Peds  Hematology   Anesthesia Other Findings   Reproductive/Obstetrics                             Anesthesia Physical Anesthesia Plan  ASA: 3  Anesthesia Plan: General   Post-op Pain Management:    Induction: Intravenous  PONV Risk Score and Plan: Ondansetron, Dexamethasone and Midazolam  Airway Management Planned: Oral ETT  Additional Equipment: Arterial line  Intra-op Plan:   Post-operative Plan: Extubation in OR  Informed Consent: I have reviewed the patients History and Physical, chart, labs and discussed the procedure including the risks, benefits and alternatives for the proposed anesthesia with the patient or authorized representative who has indicated his/her understanding and acceptance.     Dental advisory given  Plan Discussed with: CRNA and Anesthesiologist  Anesthesia Plan Comments:        Anesthesia Quick Evaluation

## 2022-09-14 NOTE — H&P (Signed)
Electrophysiology Office Note   Date:  09/14/2022   ID:  KYAN YURKOVICH, DOB October 23, 1956, MRN [id number]  PCP:  Dorothyann Peng, NP  Cardiologist:  Elissa Hefty Primary Electrophysiologist:  Constance Haw, MD    Chief Complaint: AF   History of Present Illness: Jeffrey Price is a 66 y.o. male who is being seen today for the evaluation of AF at the request of No ref. provider found. Presenting today for electrophysiology evaluation.  He has a history significant for hypertension, obesity, hyperlipidemia, hypothyroidism.  He was seen by his primary physician 10/19/2021 due to shortness of breath.  He was found to be in atrial fibrillation.  Echo showed an ejection fraction of 60 to 65% with a mildly dilated left atrium.  He had a cardioversion.  After his cardioversion he felt quite well.  He was diagnosed with sleep apnea and is now on CPAP.  Today, denies symptoms of palpitations, chest pain, shortness of breath, orthopnea, PND, lower extremity edema, claudication, dizziness, presyncope, syncope, bleeding, or neurologic sequela. The patient is tolerating medications without difficulties. Plan ablation today.    Past Medical History:  Diagnosis Date   Arthritis    in back   Atrial fibrillation (HCC)    Blood clot in vein 1995   bil legs   Bulging lumbar disc    Cataract    mild   Cellulitis 2012   forehead   Complication of anesthesia    had trouble waking up 06/11/2020   DDD (degenerative disc disease), lumbar    Diabetes mellitus without complication (HCC)    GERD (gastroesophageal reflux disease)    Graves disease    Hx of adenomatous colonic polyps 12/15/2017   Hyperlipidemia    Hypertension    Neuromuscular disorder (HCC)    neuropathy in feet   OSA on CPAP    Thyrotoxicosis without thyroid storm    Varicose veins of both lower extremities    Past Surgical History:  Procedure Laterality Date   ANTERIOR CERVICAL DECOMP/DISCECTOMY FUSION N/A 06/18/2021    Procedure: ANTERIOR CERVICAL DECOMPRESSION FUSION CERVICAL4- CERVICAL 5 WITH INSTRUEMENTATION AND ALLOGRAFT;  Surgeon: Phylliss Bob, MD;  Location: [facility];  Service: Orthopedics;  Laterality: N/A;   BACK SURGERY  06/11/2020   BUBBLE STUDY  09/10/2022   Procedure: BUBBLE STUDY;  Surgeon: Werner Lean, MD;  Location: [facility];  Service: Cardiovascular;;   CARDIOVERSION N/A 12/22/2021   Procedure: CARDIOVERSION;  Surgeon: Josue Hector, MD;  Location: [facility];  Service: Cardiovascular;  Laterality: N/A;   ENDOVENOUS ABLATION SAPHENOUS VEIN W/ LASER Left 10/18/2019   endovenous laser ablation left greater saphenous vein and stab phlebectomy > 20 incisions left leg by Gae Gallop MD    ENDOVENOUS ABLATION SAPHENOUS VEIN W/ LASER Right 11/08/2019   endovenous laser ablation right greater saphenous vein and stab phlebectomy 10-20 incisions right leg by Gae Gallop MD    LUMBAR EPIDURAL INJECTION     TEE WITHOUT CARDIOVERSION N/A 12/22/2021   Procedure: TRANSESOPHAGEAL ECHOCARDIOGRAM (TEE);  Surgeon: Josue Hector, MD;  Location: [facility];  Service: Cardiovascular;  Laterality: N/A;   TEE WITHOUT CARDIOVERSION N/A 09/10/2022   Procedure: TRANSESOPHAGEAL ECHOCARDIOGRAM (TEE);  Surgeon: Werner Lean, MD;  Location: [facility];  Service: Cardiovascular;  Laterality: N/A;     No current facility-administered medications for this encounter.    Allergies:   Patient has no known allergies.   Social History:  The patient  reports that he quit smoking about  10 years ago. His smoking use included e-cigarettes and cigarettes. He has never used smokeless tobacco. He reports current alcohol use. He reports that he does not use drugs.   Family History:  The patient's family history includes Parkinson's disease in his father; Thyroid disease in his mother.   ROS:  Please see the history of present illness.   Otherwise, review of systems is positive for none.   All other  systems are reviewed and negative.   PHYSICAL EXAM: VS:  BP (!) 148/88   Pulse 96   Temp 98.3 F (36.8 C) (Oral)   Resp 15   Ht '6\' 2"'$  (1.88 m)   Wt 130.6 kg   SpO2 96%   BMI 36.98 kg/m  , BMI Body mass index is 36.98 kg/m. GEN: Well nourished, well developed, in no acute distress  HEENT: normal  Neck: no JVD, carotid bruits, or masses Cardiac: irregular; no murmurs, rubs, or gallops,no edema  Respiratory:  clear to auscultation bilaterally, normal work of breathing GI: soft, nontender, nondistended, + BS MS: no deformity or atrophy  Skin: warm and dry Neuro:  Strength and sensation are intact Psych: euthymic mood, full affect     Recent Labs: 10/09/2021: TSH 2.16 04/12/2022: ALT 45 04/29/2022: BUN 16; Creatinine, Ser 0.88; Hemoglobin 13.7; Platelets 220; Potassium 4.4; Sodium 136    Lipid Panel     Component Value Date/Time   CHOL 102 10/09/2021 1251   CHOL 151 11/29/2018 1715   TRIG 136.0 10/09/2021 1251   HDL 35.60 (L) 10/09/2021 1251   HDL 33 (L) 11/29/2018 1715   CHOLHDL 3 10/09/2021 1251   VLDL 27.2 10/09/2021 1251   LDLCALC 40 10/09/2021 1251   LDLCALC  08/08/2020 1036     Comment:     . LDL cholesterol not calculated. Triglyceride levels greater than 400 mg/dL invalidate calculated LDL results. . Reference range: <100 . Desirable range <100 mg/dL for primary prevention;   <70 mg/dL for patients with CHD or diabetic patients  with > or = 2 CHD risk factors. Marland Kitchen LDL-C is now calculated using the Martin-Hopkins  calculation, which is a validated novel method providing  better accuracy than the Friedewald equation in the  estimation of LDL-C.  Cresenciano Genre et al. Annamaria Helling. 5809;983(38): 2061-2068  (http://education.[employer].com/faq/FAQ164)      Wt Readings from Last 3 Encounters:  09/14/22 130.6 kg  09/10/22 130.6 kg  08/12/22 130.5 kg      Other studies Reviewed: Additional studies/ records that were reviewed today include: TTE 11/05/21   Review of the above records today demonstrates:   1. Left ventricular ejection fraction, by estimation, is 60 to 65%. The  left ventricle has normal function. The left ventricle has no regional  wall motion abnormalities. There is moderate left ventricular hypertrophy.  Left ventricular diastolic  parameters are indeterminate.   2. Right ventricular systolic function is normal. The right ventricular  size is normal. There is normal pulmonary artery systolic pressure.   3. Left atrial size was mildly dilated.   4. Right atrial size was moderately dilated.   5. The mitral valve is normal in structure. No evidence of mitral valve  regurgitation. No evidence of mitral stenosis.   6. The aortic valve is normal in structure. Aortic valve regurgitation is  not visualized. No aortic stenosis is present.   7. The inferior vena cava is normal in size with greater than 50%  respiratory variability, suggesting right atrial pressure of 3 mmHg.    ASSESSMENT  AND PLAN:  1.  Persistent atrial fibrillation: ARMOND CUTHRELL has presented today for surgery, with the diagnosis of AF.  The various methods of treatment have been discussed with the patient and family. After consideration of risks, benefits and other options for treatment, the patient has consented to  Procedure(s): Catheter ablation as a surgical intervention .  Risks include but not limited to complete heart block, stroke, esophageal damage, nerve damage, bleeding, vascular damage, tamponade, perforation, MI, and death. The patient's history has been reviewed, patient examined, no change in status, stable for surgery.  I have reviewed the patient's chart and labs.  Questions were answered to the patient's satisfaction.    Romy Ipock Curt Bears, MD 09/14/2022 12:13 PM

## 2022-09-15 ENCOUNTER — Encounter (HOSPITAL_COMMUNITY): Payer: Self-pay | Admitting: Cardiology

## 2022-09-15 LAB — POCT ACTIVATED CLOTTING TIME
Activated Clotting Time: 287 seconds
Activated Clotting Time: 299 seconds
Activated Clotting Time: 317 seconds

## 2022-09-16 ENCOUNTER — Encounter: Payer: Self-pay | Admitting: Adult Health

## 2022-09-16 ENCOUNTER — Other Ambulatory Visit (HOSPITAL_COMMUNITY): Payer: Self-pay

## 2022-09-16 ENCOUNTER — Other Ambulatory Visit: Payer: Self-pay | Admitting: Adult Health

## 2022-09-17 ENCOUNTER — Other Ambulatory Visit (HOSPITAL_COMMUNITY): Payer: Self-pay

## 2022-09-17 MED ORDER — MELOXICAM 15 MG PO TABS: ORAL_TABLET | ORAL | 1 refills | Status: DC

## 2022-09-17 MED ORDER — GABAPENTIN 800 MG PO TABS
800.0000 mg | ORAL_TABLET | Freq: Three times a day (TID) | ORAL | 1 refills | Status: DC
  Filled 2022-09-17: qty 270, 90d supply, fill #0
  Filled 2022-12-07: qty 270, 90d supply, fill #1

## 2022-09-27 ENCOUNTER — Other Ambulatory Visit (HOSPITAL_COMMUNITY): Payer: Self-pay

## 2022-09-27 ENCOUNTER — Other Ambulatory Visit: Payer: Self-pay

## 2022-09-30 ENCOUNTER — Encounter: Payer: Self-pay | Admitting: Adult Health

## 2022-10-01 ENCOUNTER — Other Ambulatory Visit: Payer: Self-pay

## 2022-10-01 ENCOUNTER — Other Ambulatory Visit (HOSPITAL_COMMUNITY): Payer: Self-pay

## 2022-10-05 NOTE — Telephone Encounter (Signed)
Please advise 

## 2022-10-10 DIAGNOSIS — R400 Somnolence: Secondary | ICD-10-CM | POA: Diagnosis not present

## 2022-10-10 DIAGNOSIS — G4733 Obstructive sleep apnea (adult) (pediatric): Secondary | ICD-10-CM | POA: Diagnosis not present

## 2022-10-12 ENCOUNTER — Encounter: Payer: Self-pay | Admitting: Cardiology

## 2022-10-12 ENCOUNTER — Ambulatory Visit (HOSPITAL_COMMUNITY)
Admission: RE | Admit: 2022-10-12 | Discharge: 2022-10-12 | Disposition: A | Discharge status: Home / Self Care | Payer: PPO | Source: Ambulatory Visit | Attending: Cardiology | Admitting: Cardiology

## 2022-10-12 ENCOUNTER — Ambulatory Visit (INDEPENDENT_AMBULATORY_CARE_PROVIDER_SITE_OTHER): Payer: PPO | Admitting: Adult Health

## 2022-10-12 ENCOUNTER — Encounter (HOSPITAL_COMMUNITY): Payer: Self-pay | Admitting: Physician Assistant

## 2022-10-12 ENCOUNTER — Other Ambulatory Visit: Payer: Self-pay

## 2022-10-12 ENCOUNTER — Encounter: Payer: Self-pay | Admitting: Adult Health

## 2022-10-12 VITALS — BP 100/80 | HR 84 | Temp 97.7°F | Ht 74.0 in | Wt 283.0 lb

## 2022-10-12 VITALS — BP 118/82 | HR 114 | Ht 74.0 in | Wt 282.8 lb

## 2022-10-12 DIAGNOSIS — Z0000 Encounter for general adult medical examination without abnormal findings: Secondary | ICD-10-CM | POA: Diagnosis not present

## 2022-10-12 DIAGNOSIS — K219 Gastro-esophageal reflux disease without esophagitis: Secondary | ICD-10-CM | POA: Diagnosis not present

## 2022-10-12 DIAGNOSIS — Z7901 Long term (current) use of anticoagulants: Secondary | ICD-10-CM | POA: Insufficient documentation

## 2022-10-12 DIAGNOSIS — I4819 Other persistent atrial fibrillation: Secondary | ICD-10-CM | POA: Diagnosis not present

## 2022-10-12 DIAGNOSIS — E1142 Type 2 diabetes mellitus with diabetic polyneuropathy: Secondary | ICD-10-CM | POA: Diagnosis not present

## 2022-10-12 DIAGNOSIS — G6289 Other specified polyneuropathies: Secondary | ICD-10-CM

## 2022-10-12 DIAGNOSIS — G4733 Obstructive sleep apnea (adult) (pediatric): Secondary | ICD-10-CM | POA: Diagnosis not present

## 2022-10-12 DIAGNOSIS — E785 Hyperlipidemia, unspecified: Secondary | ICD-10-CM | POA: Diagnosis not present

## 2022-10-12 DIAGNOSIS — D6869 Other thrombophilia: Secondary | ICD-10-CM | POA: Diagnosis not present

## 2022-10-12 DIAGNOSIS — I10 Essential (primary) hypertension: Secondary | ICD-10-CM | POA: Diagnosis not present

## 2022-10-12 DIAGNOSIS — E0590 Thyrotoxicosis, unspecified without thyrotoxic crisis or storm: Secondary | ICD-10-CM | POA: Diagnosis not present

## 2022-10-12 DIAGNOSIS — I480 Paroxysmal atrial fibrillation: Secondary | ICD-10-CM | POA: Diagnosis not present

## 2022-10-12 DIAGNOSIS — I2510 Atherosclerotic heart disease of native coronary artery without angina pectoris: Secondary | ICD-10-CM | POA: Insufficient documentation

## 2022-10-12 DIAGNOSIS — Z125 Encounter for screening for malignant neoplasm of prostate: Secondary | ICD-10-CM | POA: Diagnosis not present

## 2022-10-12 MED ORDER — CLOTRIMAZOLE 1 % EX CREA: 1.0000 | TOPICAL_CREAM | Freq: Two times a day (BID) | CUTANEOUS | 2 refills | Status: AC | PRN

## 2022-10-12 MED ORDER — METFORMIN HCL ER 500 MG PO TB24: 500.0000 mg | ORAL_TABLET | Freq: Every day | ORAL | 0 refills | Status: DC
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
## 2022-10-12 NOTE — H&P (View-Only) (Signed)
Primary Care Physician: Jeffrey Peng, NP Referring Physician: Dr. Curt Price Primary Cardiologist: Dr Jeffrey Price is a 66 y.o. male with a h/o persistent afib that was scheduled to have an ablation in August but got sick and it was postponed until 11/7. He was advised to cone to afib clinic to discuss meds that may help restore SR until then.  He states that at rest he feels ok. Mostly when he is symptomatic is with walking a fair distance.   We  discussed meds that may help to restore SR as a bridge to ablation. He does  have thyroid disease, on tapazole, so I think amiodarone would not be best option.  Pt does not want to pursue Tikosyn for the necessary hospitalization. He does have multivessel CAD by chest CT. Recent stress test did not show any ischemia. Multaq would be price prohibitive for him as he is in the donut hole. Use of flecainide would probably entail another cardioversion and he does not know if he wants to go thru with another cardioversion. We also discussed additional dose of atenolol as he is just on this once a day and he is in agreement with this.   Follow up in the AF clinic 10/12/22. Patient is s/p afib ablation with Dr Jeffrey Price on 09/14/22. He states that he felt well for about one week but started noticing more dyspnea with exertion. He is in afib today. He denies chest pain, swallowing pain, or groin issues.   Today, he denies symptoms of palpitations, chest pain, orthopnea, PND, lower extremity edema, dizziness, presyncope, syncope, or neurologic sequela. The patient is tolerating medications without difficulties and is otherwise without complaint today.   Past Medical History:  Diagnosis Date   Arthritis    in back   Atrial fibrillation (HCC)    Blood clot in vein 1995   bil legs   Bulging lumbar disc    Cataract    mild   Cellulitis 2012   forehead   Complication of anesthesia    had trouble waking up 06/11/2020   DDD (degenerative disc disease),  lumbar    Diabetes mellitus without complication (HCC)    GERD (gastroesophageal reflux disease)    Graves disease    Hx of adenomatous colonic polyps 12/15/2017   Hyperlipidemia    Hypertension    Neuromuscular disorder (HCC)    neuropathy in feet   OSA on CPAP    Thyrotoxicosis without thyroid storm    Varicose veins of both lower extremities    Past Surgical History:  Procedure Laterality Date   ANTERIOR CERVICAL DECOMP/DISCECTOMY FUSION N/A 06/18/2021   Procedure: ANTERIOR CERVICAL DECOMPRESSION FUSION CERVICAL4- CERVICAL 5 WITH INSTRUEMENTATION AND ALLOGRAFT;  Surgeon: Jeffrey Bob, MD;  Location: [facility];  Service: Orthopedics;  Laterality: N/A;   ATRIAL FIBRILLATION ABLATION N/A 09/14/2022   Procedure: ATRIAL FIBRILLATION ABLATION;  Surgeon: Jeffrey Haw, MD;  Location: [facility];  Service: Cardiovascular;  Laterality: N/A;   BACK SURGERY  06/11/2020   BUBBLE STUDY  09/10/2022   Procedure: BUBBLE STUDY;  Surgeon: Jeffrey Lean, MD;  Location: [facility];  Service: Cardiovascular;;   CARDIOVERSION N/A 12/22/2021   Procedure: CARDIOVERSION;  Surgeon: Jeffrey Hector, MD;  Location: [facility];  Service: Cardiovascular;  Laterality: N/A;   ENDOVENOUS ABLATION SAPHENOUS VEIN W/ LASER Left 10/18/2019   endovenous laser ablation left greater saphenous vein and stab phlebectomy > 20 incisions left leg by Jeffrey Gallop MD  ENDOVENOUS ABLATION SAPHENOUS VEIN W/ LASER Right 11/08/2019   endovenous laser ablation right greater saphenous vein and stab phlebectomy 10-20 incisions right leg by Jeffrey Gallop MD    LUMBAR EPIDURAL INJECTION     TEE WITHOUT CARDIOVERSION N/A 12/22/2021   Procedure: TRANSESOPHAGEAL ECHOCARDIOGRAM (TEE);  Surgeon: Jeffrey Hector, MD;  Location: [facility] ENDOSCOPY;  Service: Cardiovascular;  Laterality: N/A;   TEE WITHOUT CARDIOVERSION N/A 09/10/2022   Procedure: TRANSESOPHAGEAL ECHOCARDIOGRAM (TEE);  Surgeon: Jeffrey Lean, MD;   Location: [facility] ENDOSCOPY;  Service: Cardiovascular;  Laterality: N/A;    Current Outpatient Medications  Medication Sig Dispense Refill   acetaminophen (TYLENOL) 325 MG tablet Take 2 tablets (650 mg total) by mouth every 6 (six) hours as needed for mild pain (or Fever >/= 101).     atenolol (TENORMIN) 50 MG tablet Take 1 tablet in the AM and 1/2 tablet in the PM 90 tablet 2   atorvastatin (LIPITOR) 20 MG tablet Take 1 tablet (20 mg total) by mouth daily. 90 tablet 3   blood glucose meter kit and supplies KIT Dispense based on patient and insurance preference. Use up to four times daily as directed. 1 each 0   cetirizine (ZYRTEC) 10 MG tablet Take 10 mg by mouth daily as needed for allergies.     clotrimazole (CLOTRIMAZOLE AF) 1 % cream Apply topically twice daily as needed (rash). 45 g 2   Cyanocobalamin (B-12 SL) Place 1 tablet under the tongue daily.     cyclobenzaprine (FLEXERIL) 10 MG tablet Take 1 tablet (10 mg total) by mouth 2 (two) times daily as needed for muscle spasms. 90 tablet 3   folic acid (FOLVITE) 1 MG tablet Take 1 tablet (1 mg total) by mouth daily.     gabapentin (NEURONTIN) 100 MG capsule Take 1 - 3 capsules by mouth 3 times daily as needed for neuropathic pain. (in addition to the 800 mg tablets) 270 capsule 1   gabapentin (NEURONTIN) 800 MG tablet Take 1 tablet (800 mg total) by mouth 3 (three) times daily. 270 tablet 1   meloxicam (MOBIC) 15 MG tablet TAKE 1 TABLET BY MOUTH ONCE DAILY AS NEEDED FOR INFLAMMATION 90 tablet 1   metFORMIN (GLUCOPHAGE-XR) 500 MG 24 hr tablet Take 1 tablet (500 mg total) by mouth daily with breakfast. 90 tablet 0   methimazole (TAPAZOLE) 5 MG tablet Take 1 tablet (5 mg total) by mouth every morning. 90 tablet 1   pantoprazole (PROTONIX) 40 MG tablet TAKE 1 TABLET BY MOUTH DAILY * PT NEEDS APPT 90 tablet 1   rivaroxaban (XARELTO) 20 MG TABS tablet Take 1 tablet (20 mg total) by mouth daily with supper. 90 tablet 1   sildenafil (VIAGRA) 100 MG  tablet Take 1/2-1 tablet by mouth daily as needed for erectile dysfunction. 30 tablet 2   tazarotene (AVAGE) 0.1 % cream SMARTSIG:1 Application Topical Every Evening     tretinoin (RETIN-A) 0.05 % cream Apply 1 application. topically daily as needed (oil skin). 45 g 3   triamcinolone cream (KENALOG) 0.1 % Apply to affected area(s) twice daily for 14 days (Patient taking differently: 1 Application 2 (two) times daily as needed (irritation).) 45 g 0   furosemide (LASIX) 80 MG tablet Take 1 tablet by mouth 2 times daily as needed. 180 tablet 3   No current facility-administered medications for this encounter.    No Known Allergies  Social History   Socioeconomic History   Marital status: Significant Other    Spouse name:  Not on file   Number of children: 1   Years of education: Not on file   Highest education level: Some college, no degree  Occupational History   Occupation: Retiring  Tobacco Use   Smoking status: Former    Types: E-cigarettes, Cigarettes    Quit date: 12/03/2011    Years since quitting: 10.8   Smokeless tobacco: Never   Tobacco comments:    Former smoker 10/12/22  Vaping Use   Vaping Use: Every day   Substances: Nicotine, Flavoring  Substance and Sexual Activity   Alcohol use: Yes    Comment: whiskey, Burbon, as few drinks per night and more on weekends 10/12/22   Drug use: No   Sexual activity: Not on file  Other Topics Concern   Not on file  Social History Narrative   Patient is widowed.    He has girlfriend.   Right-handed.   Caffeine use: 2-3 cups per day.      Social Determinants of Health   Financial Resource Strain: Low Risk  (10/09/2021)   Overall Financial Resource Strain (CARDIA)    Difficulty of Paying Living Expenses: Not hard at all  Food Insecurity: No Food Insecurity (10/09/2021)   Hunger Vital Sign    Worried About Running Out of Food in the Last Year: Never true    Ran Out of Food in the Last Year: Never true  Transportation Needs:  No Transportation Needs (10/09/2021)   PRAPARE - Hydrologist (Medical): No    Lack of Transportation (Non-Medical): No  Physical Activity: Unknown (10/09/2021)   Exercise Vital Sign    Days of Exercise per Week: 0 days    Minutes of Exercise per Session: Not on file  Stress: Stress Concern Present (10/09/2021)   [place]    Feeling of Stress : To some extent  Social Connections: Socially Isolated (10/09/2021)   Social Connection and Isolation Panel [NHANES]    Frequency of Communication with Friends and Family: More than three times a week    Frequency of Social Gatherings with Friends and Family: Once a week    Attends Religious Services: Never    Marine scientist or Organizations: No    Attends Music therapist: Not on file    Marital Status: Widowed  Human resources officer Violence: Not on file    Family History  Problem Relation Age of Onset   Thyroid disease Mother    Parkinson's disease Father    Colon cancer Neg Hx    Rectal cancer Neg Hx    Stomach cancer Neg Hx    Esophageal cancer Neg Hx     ROS- All systems are reviewed and negative except as per the HPI above  Physical Exam: Vitals:   10/12/22 1139  BP: 118/82  Pulse: (!) 114  Weight: 128.3 kg  Height: _0  (1.88 m)    Wt Readings from Last 3 Encounters:  10/12/22 128.3 kg  10/12/22 128.4 kg  09/14/22 130.6 kg    Labs: Lab Results  Component Value Date   NA 138 09/14/2022   K 4.0 09/14/2022   CL 102 09/14/2022   CO2 25 09/14/2022   GLUCOSE 116 (H) 09/14/2022   BUN 12 09/14/2022   CREATININE 0.92 09/14/2022   CALCIUM 9.1 09/14/2022   PHOS 2.9 03/31/2018   Lab Results  Component Value Date   INR 1.1 06/15/2021   Lab Results  Component Value  Date   CHOL 102 10/09/2021   HDL 35.60 (L) 10/09/2021   LDLCALC 40 10/09/2021   TRIG 136.0 10/09/2021     GEN- The patient is a well  appearing obese male, alert and oriented x 3 today.   HEENT-head normocephalic, atraumatic, sclera clear, conjunctiva pink, hearing intact, trachea midline. Lungs- Clear to ausculation bilaterally, normal work of breathing Heart- irregular rate and rhythm, no murmurs, rubs or gallops  GI- soft, NT, ND, + BS Extremities- no clubbing, cyanosis, or edema MS- no significant deformity or atrophy Skin- no rash or lesion Psych- euthymic mood, full affect Neuro- strength and sensation are intact   EKG- Afib Vent. rate 114 BPM PR interval * ms QRS duration 72 ms QT/QTcB 304/419 ms   CHA2DS2-VASc Score = 4  The patient's score is based upon: CHF History: 0 HTN History: 1 Diabetes History: 1 Stroke History: 0 Vascular Disease History: 1 Age Score: 1 Gender Score: 0       ASSESSMENT AND PLAN: 1. Persistent Atrial Fibrillation (ICD10:  I48.19) The patient's CHA2DS2-VASc score is 4, indicating a 4.8% annual risk of stroke.   S/p afib ablation 09/14/22 Patient back in afib today, seems persistent based on his symptoms. Will arrange for DCCV. If AAD is needed, would avoid amiodarone with hyperthyroidism on methimazole. Multaq is cost prohibitive. Would like to avoid class IC with CAD although myoview showed no ischemia. Could consider dofetilide.  Continue Xarelto 20 mg daily with no missed doses for 3 months post ablation.   2. Secondary Hypercoagulable State (ICD10:  D68.69) The patient is at significant risk for stroke/thromboembolism based upon his CHA2DS2-VASc Score of 4.  Continue Rivaroxaban (Xarelto).   3. HTN Stable, no changes today.  4. OSA Encouraged compliance with CPAP therapy.  5. CAD CAC score 315 Myoview 01/20/22, no ischemia.  No anginal symptoms.   Follow up with Dr Johnsie Cancel and Dr Jeffrey Price as scheduled.   [facility] [address] [place], [place] [address] [phone]

## 2022-10-12 NOTE — Patient Instructions (Signed)
It was great seeing you today   We will follow up with you regarding your lab work   Please let me know if you need anything   

## 2022-10-12 NOTE — Progress Notes (Signed)
Primary Care Physician: Dorothyann Peng, NP Referring Physician: Dr. Curt Bears Primary Cardiologist: Dr Boone Jeffrey Price is a 66 y.o. male with a h/o persistent afib that was scheduled to have an ablation in August but got sick and it was postponed until 11/7. He was advised to cone to afib clinic to discuss meds that may help restore SR until then.  He states that at rest he feels ok. Mostly when he is symptomatic is with walking a fair distance.   We  discussed meds that may help to restore SR as a bridge to ablation. He does  have thyroid disease, on tapazole, so I think amiodarone would not be best option.  Pt does not want to pursue Tikosyn for the necessary hospitalization. He does have multivessel CAD by chest CT. Recent stress test did not show any ischemia. Multaq would be price prohibitive for him as he is in the donut hole. Use of flecainide would probably entail another cardioversion and he does not know if he wants to go thru with another cardioversion. We also discussed additional dose of atenolol as he is just on this once a day and he is in agreement with this.   Follow up in the AF clinic 10/12/22. Patient is s/p afib ablation with Dr Curt Bears on 09/14/22. He states that he felt well for about one week but started noticing more dyspnea with exertion. He is in afib today. He denies chest pain, swallowing pain, or groin issues.   Today, he denies symptoms of palpitations, chest pain, orthopnea, PND, lower extremity edema, dizziness, presyncope, syncope, or neurologic sequela. The patient is tolerating medications without difficulties and is otherwise without complaint today.   Past Medical History:  Diagnosis Date   Arthritis    in back   Atrial fibrillation (HCC)    Blood clot in vein 1995   bil legs   Bulging lumbar disc    Cataract    mild   Cellulitis 2012   forehead   Complication of anesthesia    had trouble waking up 06/11/2020   DDD (degenerative disc disease),  lumbar    Diabetes mellitus without complication (HCC)    GERD (gastroesophageal reflux disease)    Graves disease    Hx of adenomatous colonic polyps 12/15/2017   Hyperlipidemia    Hypertension    Neuromuscular disorder (HCC)    neuropathy in feet   OSA on CPAP    Thyrotoxicosis without thyroid storm    Varicose veins of both lower extremities    Past Surgical History:  Procedure Laterality Date   ANTERIOR CERVICAL DECOMP/DISCECTOMY FUSION N/A 06/18/2021   Procedure: ANTERIOR CERVICAL DECOMPRESSION FUSION CERVICAL4- CERVICAL 5 WITH INSTRUEMENTATION AND ALLOGRAFT;  Surgeon: Phylliss Bob, MD;  Location: [facility];  Service: Orthopedics;  Laterality: N/A;   ATRIAL FIBRILLATION ABLATION N/A 09/14/2022   Procedure: ATRIAL FIBRILLATION ABLATION;  Surgeon: Constance Haw, MD;  Location: [facility];  Service: Cardiovascular;  Laterality: N/A;   BACK SURGERY  06/11/2020   BUBBLE STUDY  09/10/2022   Procedure: BUBBLE STUDY;  Surgeon: Werner Lean, MD;  Location: [facility];  Service: Cardiovascular;;   CARDIOVERSION N/A 12/22/2021   Procedure: CARDIOVERSION;  Surgeon: Josue Hector, MD;  Location: [facility];  Service: Cardiovascular;  Laterality: N/A;   ENDOVENOUS ABLATION SAPHENOUS VEIN W/ LASER Left 10/18/2019   endovenous laser ablation left greater saphenous vein and stab phlebectomy > 20 incisions left leg by Gae Gallop MD  ENDOVENOUS ABLATION SAPHENOUS VEIN W/ LASER Right 11/08/2019   endovenous laser ablation right greater saphenous vein and stab phlebectomy 10-20 incisions right leg by Gae Gallop MD    LUMBAR EPIDURAL INJECTION     TEE WITHOUT CARDIOVERSION N/A 12/22/2021   Procedure: TRANSESOPHAGEAL ECHOCARDIOGRAM (TEE);  Surgeon: Josue Hector, MD;  Location: [facility]COPY;  Service: Cardiovascular;  Laterality: N/A;   TEE WITHOUT CARDIOVERSION N/A 09/10/2022   Procedure: TRANSESOPHAGEAL ECHOCARDIOGRAM (TEE);  Surgeon: Werner Lean, MD;   Location: [facility] ENDOSCOPY;  Service: Cardiovascular;  Laterality: N/A;    Current Outpatient Medications  Medication Sig Dispense Refill   acetaminophen (TYLENOL) 325 MG tablet Take 2 tablets (650 mg total) by mouth every 6 (six) hours as needed for mild pain (or Fever >/= 101).     atenolol (TENORMIN) 50 MG tablet Take 1 tablet in the AM and 1/2 tablet in the PM 90 tablet 2   atorvastatin (LIPITOR) 20 MG tablet Take 1 tablet (20 mg total) by mouth daily. 90 tablet 3   blood glucose meter kit and supplies KIT Dispense based on patient and insurance preference. Use up to four times daily as directed. 1 each 0   cetirizine (ZYRTEC) 10 MG tablet Take 10 mg by mouth daily as needed for allergies.     clotrimazole (CLOTRIMAZOLE AF) 1 % cream Apply topically twice daily as needed (rash). 45 g 2   Cyanocobalamin (B-12 SL) Place 1 tablet under the tongue daily.     cyclobenzaprine (FLEXERIL) 10 MG tablet Take 1 tablet (10 mg total) by mouth 2 (two) times daily as needed for muscle spasms. 90 tablet 3   folic acid (FOLVITE) 1 MG tablet Take 1 tablet (1 mg total) by mouth daily.     gabapentin (NEURONTIN) 100 MG capsule Take 1 - 3 capsules by mouth 3 times daily as needed for neuropathic pain. (in addition to the 800 mg tablets) 270 capsule 1   gabapentin (NEURONTIN) 800 MG tablet Take 1 tablet (800 mg total) by mouth 3 (three) times daily. 270 tablet 1   meloxicam (MOBIC) 15 MG tablet TAKE 1 TABLET BY MOUTH ONCE DAILY AS NEEDED FOR INFLAMMATION 90 tablet 1   metFORMIN (GLUCOPHAGE-XR) 500 MG 24 hr tablet Take 1 tablet (500 mg total) by mouth daily with breakfast. 90 tablet 0   methimazole (TAPAZOLE) 5 MG tablet Take 1 tablet (5 mg total) by mouth every morning. 90 tablet 1   pantoprazole (PROTONIX) 40 MG tablet TAKE 1 TABLET BY MOUTH DAILY * PT NEEDS APPT 90 tablet 1   rivaroxaban (XARELTO) 20 MG TABS tablet Take 1 tablet (20 mg total) by mouth daily with supper. 90 tablet 1   sildenafil (VIAGRA) 100 MG  tablet Take 1/2-1 tablet by mouth daily as needed for erectile dysfunction. 30 tablet 2   tazarotene (AVAGE) 0.1 % cream SMARTSIG:1 Application Topical Every Evening     tretinoin (RETIN-A) 0.05 % cream Apply 1 application. topically daily as needed (oil skin). 45 g 3   triamcinolone cream (KENALOG) 0.1 % Apply to affected area(s) twice daily for 14 days (Patient taking differently: 1 Application 2 (two) times daily as needed (irritation).) 45 g 0   furosemide (LASIX) 80 MG tablet Take 1 tablet by mouth 2 times daily as needed. 180 tablet 3   No current facility-administered medications for this encounter.    No Known Allergies  Social History   Socioeconomic History   Marital status: Significant Other    Spouse name:  Not on file   Number of children: 1   Years of education: Not on file   Highest education level: Some college, no degree  Occupational History   Occupation: Retiring  Tobacco Use   Smoking status: Former    Types: E-cigarettes, Cigarettes    Quit date: 12/03/2011    Years since quitting: 10.8   Smokeless tobacco: Never   Tobacco comments:    Former smoker 10/12/22  Vaping Use   Vaping Use: Every day   Substances: Nicotine, Flavoring  Substance and Sexual Activity   Alcohol use: Yes    Comment: whiskey, Burbon, as few drinks per night and more on weekends 10/12/22   Drug use: No   Sexual activity: Not on file  Other Topics Concern   Not on file  Social History Narrative   Patient is widowed.    He has girlfriend.   Right-handed.   Caffeine use: 2-3 cups per day.      Social Determinants of Health   Financial Resource Strain: Low Risk  (10/09/2021)   Overall Financial Resource Strain (CARDIA)    Difficulty of Paying Living Expenses: Not hard at all  Food Insecurity: No Food Insecurity (10/09/2021)   Hunger Vital Sign    Worried About Running Out of Food in the Last Year: Never true    Ran Out of Food in the Last Year: Never true  Transportation Needs:  No Transportation Needs (10/09/2021)   PRAPARE - Hydrologist (Medical): No    Lack of Transportation (Non-Medical): No  Physical Activity: Unknown (10/09/2021)   Exercise Vital Sign    Days of Exercise per Week: 0 days    Minutes of Exercise per Session: Not on file  Stress: Stress Concern Present (10/09/2021)   [place]    Feeling of Stress : To some extent  Social Connections: Socially Isolated (10/09/2021)   Social Connection and Isolation Panel [NHANES]    Frequency of Communication with Friends and Family: More than three times a week    Frequency of Social Gatherings with Friends and Family: Once a week    Attends Religious Services: Never    Marine scientist or Organizations: No    Attends Music therapist: Not on file    Marital Status: Widowed  Human resources officer Violence: Not on file    Family History  Problem Relation Age of Onset   Thyroid disease Mother    Parkinson's disease Father    Colon cancer Neg Hx    Rectal cancer Neg Hx    Stomach cancer Neg Hx    Esophageal cancer Neg Hx     ROS- All systems are reviewed and negative except as per the HPI above  Physical Exam: Vitals:   10/12/22 1139  BP: 118/82  Pulse: (!) 114  Weight: 128.3 kg  Height: _0  (1.88 m)    Wt Readings from Last 3 Encounters:  10/12/22 128.3 kg  10/12/22 128.4 kg  09/14/22 130.6 kg    Labs: Lab Results  Component Value Date   NA 138 09/14/2022   K 4.0 09/14/2022   CL 102 09/14/2022   CO2 25 09/14/2022   GLUCOSE 116 (H) 09/14/2022   BUN 12 09/14/2022   CREATININE 0.92 09/14/2022   CALCIUM 9.1 09/14/2022   PHOS 2.9 03/31/2018   Lab Results  Component Value Date   INR 1.1 06/15/2021   Lab Results  Component Value  Date   CHOL 102 10/09/2021   HDL 35.60 (L) 10/09/2021   LDLCALC 40 10/09/2021   TRIG 136.0 10/09/2021     GEN- The patient is a well  appearing obese male, alert and oriented x 3 today.   HEENT-head normocephalic, atraumatic, sclera clear, conjunctiva pink, hearing intact, trachea midline. Lungs- Clear to ausculation bilaterally, normal work of breathing Heart- irregular rate and rhythm, no murmurs, rubs or gallops  GI- soft, NT, ND, + BS Extremities- no clubbing, cyanosis, or edema MS- no significant deformity or atrophy Skin- no rash or lesion Psych- euthymic mood, full affect Neuro- strength and sensation are intact   EKG- Afib Vent. rate 114 BPM PR interval * ms QRS duration 72 ms QT/QTcB 304/419 ms   CHA2DS2-VASc Score = 4  The patient's score is based upon: CHF History: 0 HTN History: 1 Diabetes History: 1 Stroke History: 0 Vascular Disease History: 1 Age Score: 1 Gender Score: 0       ASSESSMENT AND PLAN: 1. Persistent Atrial Fibrillation (ICD10:  I48.19) The patient's CHA2DS2-VASc score is 4, indicating a 4.8% annual risk of stroke.   S/p afib ablation 09/14/22 Patient back in afib today, seems persistent based on his symptoms. Will arrange for DCCV. If AAD is needed, would avoid amiodarone with hyperthyroidism on methimazole. Multaq is cost prohibitive. Would like to avoid class IC with CAD although myoview showed no ischemia. Could consider dofetilide.  Continue Xarelto 20 mg daily with no missed doses for 3 months post ablation.   2. Secondary Hypercoagulable State (ICD10:  D68.69) The patient is at significant risk for stroke/thromboembolism based upon his CHA2DS2-VASc Score of 4.  Continue Rivaroxaban (Xarelto).   3. HTN Stable, no changes today.  4. OSA Encouraged compliance with CPAP therapy.  5. CAD CAC score 315 Myoview 01/20/22, no ischemia.  No anginal symptoms.   Follow up with Dr Johnsie Cancel and Dr Curt Bears as scheduled.   [facility] [address] [place], [place] [address] [phone]

## 2022-10-12 NOTE — Patient Instructions (Signed)
Cardioversion scheduled for: Monday, December 11th   - Arrive at the [facility] and go to admitting at 730am   - Do not eat or drink anything after midnight the night prior to your procedure.   - Take all your morning medication (except diabetic medications) with a sip of water prior to arrival. - You will not be able to drive home after your procedure.    - Do NOT miss any doses of your blood thinner - if you should miss a dose please notify our office immediately.   - If you feel as if you go back into normal rhythm prior to scheduled cardioversion, please notify our office immediately.  If your procedure is canceled in the cardioversion suite you will be charged a cancellation fee.   If you are on weekly OZEMPIC, TRULICITY, MOUNJARO, OR BYDUREON  Hold medication 7 days prior to scheduled procedure/anesthesia.  Restart medication on the normal dosing day after scheduled procedure/anesthesia  If you are on daily BYETTA, WEGOVY, VICTOZA, ADLYXIN, OR RYBELSUS:   Hold medication 24 hours prior to scheduled procedure/anesthesia.   Restart medication on the following day after scheduled procedure/anesthesia   For those patients who have a scheduled procedure/anesthesia on the same day of the week as their dose, hold the medication on the day of surgery.  They can take their scheduled dose the week before.  **Patients on the above medications scheduled for elective procedures that have not held the medication for the appropriate amount of time are at risk of cancellation or change in the anesthetic plan.

## 2022-10-13 ENCOUNTER — Other Ambulatory Visit (INDEPENDENT_AMBULATORY_CARE_PROVIDER_SITE_OTHER): Payer: PPO

## 2022-10-13 ENCOUNTER — Encounter: Payer: Self-pay | Admitting: Adult Health

## 2022-10-13 DIAGNOSIS — E0590 Thyrotoxicosis, unspecified without thyrotoxic crisis or storm: Secondary | ICD-10-CM

## 2022-10-13 DIAGNOSIS — E1142 Type 2 diabetes mellitus with diabetic polyneuropathy: Secondary | ICD-10-CM | POA: Diagnosis not present

## 2022-10-13 DIAGNOSIS — Z125 Encounter for screening for malignant neoplasm of prostate: Secondary | ICD-10-CM

## 2022-10-13 DIAGNOSIS — I480 Paroxysmal atrial fibrillation: Secondary | ICD-10-CM | POA: Diagnosis not present

## 2022-10-13 DIAGNOSIS — I10 Essential (primary) hypertension: Secondary | ICD-10-CM

## 2022-10-13 DIAGNOSIS — E785 Hyperlipidemia, unspecified: Secondary | ICD-10-CM

## 2022-10-13 DIAGNOSIS — G6289 Other specified polyneuropathies: Secondary | ICD-10-CM

## 2022-10-13 LAB — MICROALBUMIN / CREATININE URINE RATIO
Creatinine,U: 24 mg/dL
Microalb Creat Ratio: 2.9 mg/g (ref 0.0–30.0)
Microalb, Ur: <0.7 mg/dL (ref 0.0–1.9)

## 2022-10-13 LAB — PSA: PSA: 0.27 ng/mL (ref 0.10–4.00)

## 2022-10-13 LAB — CBC WITH DIFFERENTIAL/PLATELET
Basophils Absolute: 0 10*3/uL (ref 0.0–0.1)
Basophils Relative: 0.6 % (ref 0.0–3.0)
Eosinophils Absolute: 0.2 10*3/uL (ref 0.0–0.7)
Eosinophils Relative: 1.8 % (ref 0.0–5.0)
HCT: 46 % (ref 39.0–52.0)
Hemoglobin: 15.6 g/dL (ref 13.0–17.0)
Lymphocytes Relative: 29.8 % (ref 12.0–46.0)
Lymphs Abs: 2.5 10*3/uL (ref 0.7–4.0)
MCHC: 33.9 g/dL (ref 30.0–36.0)
MCV: 90.7 fl (ref 78.0–100.0)
Monocytes Absolute: 0.7 10*3/uL (ref 0.1–1.0)
Monocytes Relative: 8.7 % (ref 3.0–12.0)
Neutro Abs: 5 10*3/uL (ref 1.4–7.7)
Neutrophils Relative %: 59.1 % (ref 43.0–77.0)
Platelets: 263 10*3/uL (ref 150.0–400.0)
RBC: 5.08 Mil/uL (ref 4.22–5.81)
RDW: 13.9 % (ref 11.5–15.5)
WBC: 8.5 10*3/uL (ref 4.0–10.5)

## 2022-10-13 LAB — COMPREHENSIVE METABOLIC PANEL
ALT: 48 U/L (ref 0–53)
AST: 39 U/L — ABNORMAL HIGH (ref 0–37)
Albumin: 4.4 g/dL (ref 3.5–5.2)
Alkaline Phosphatase: 104 U/L (ref 39–117)
BUN: 16 mg/dL (ref 6–23)
CO2: 28 mEq/L (ref 19–32)
Calcium: 9.3 mg/dL (ref 8.4–10.5)
Chloride: 97 mEq/L (ref 96–112)
Creatinine, Ser: 1.1 mg/dL (ref 0.40–1.50)
GFR: 70.25 mL/min (ref >60.00)
Glucose, Bld: 136 mg/dL — ABNORMAL HIGH (ref 70–99)
Potassium: 3.8 mEq/L (ref 3.5–5.1)
Sodium: 138 mEq/L (ref 135–145)
Total Bilirubin: 0.8 mg/dL (ref 0.2–1.2)
Total Protein: 7.5 g/dL (ref 6.0–8.3)

## 2022-10-13 LAB — LIPID PANEL
Cholesterol: 159 mg/dL (ref 0–200)
HDL: 31.6 mg/dL — ABNORMAL LOW (ref >39.00)
Total CHOL/HDL Ratio: 5
Triglycerides: 463 mg/dL — ABNORMAL HIGH (ref 0.0–149.0)

## 2022-10-13 LAB — LDL CHOLESTEROL, DIRECT: Direct LDL: 71 mg/dL

## 2022-10-13 LAB — TSH: TSH: 3.64 u[IU]/mL (ref 0.35–5.50)

## 2022-10-13 LAB — HEMOGLOBIN A1C: Hgb A1c MFr Bld: 7.1 % — ABNORMAL HIGH (ref 4.6–6.5)

## 2022-10-13 NOTE — Telephone Encounter (Signed)
Left patient message informing I would get him some samples and let him know when ready

## 2022-10-14 ENCOUNTER — Other Ambulatory Visit: Payer: Self-pay | Admitting: Adult Health
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
## 2022-10-17 NOTE — Anesthesia Preprocedure Evaluation (Signed)
Anesthesia Evaluation  Patient identified by MRN, date of birth, ID band Patient awake    Reviewed: Allergy & Precautions, NPO status , Patient's Chart, lab work & pertinent test results, reviewed documented beta blocker date and time   Airway Mallampati: II  TM Distance: >3 FB Neck ROM: Full    Dental no notable dental hx. (+) Dental Advisory Given, Teeth Intact   Pulmonary sleep apnea and Continuous Positive Airway Pressure Ventilation , COPD, former smoker   Pulmonary exam normal breath sounds clear to auscultation       Cardiovascular hypertension, Pt. on home beta blockers and Pt. on medications + CAD  Normal cardiovascular exam+ dysrhythmias Atrial Fibrillation  Rhythm:Irregular Rate:Normal  TEE 2023 1. Left ventricular ejection fraction, by estimation, is 50 to 55%. The  left ventricle has low normal function.   2. Right ventricular systolic function is normal. The right ventricular  size is normal.   3. The left atrial appendage is patent. It is a mixed cauliflower with  one posterior lobe. Ostial dimensions are 28 mm by 24 mm with suitable  depth for a LAA-O device if desired.. Left atrial size was moderately  dilated. No left atrial/left atrial  appendage thrombus was detected. The LAA emptying velocity was 57 cm/s.   4. Right atrial size was moderately dilated.   5. The mitral valve is normal in structure. Trivial mitral valve  regurgitation. No evidence of mitral stenosis.   6. The aortic valve is tricuspid. Aortic valve regurgitation is trivial.  No aortic stenosis is present.   7. Dillated SVC and coronary sinus.   8. Evidence of atrial level shunting detected by color flow Doppler.  Agitated saline contrast bubble study was positive with shunting observed  within 3-6 cardiac cycles suggestive of interatrial shunt. Shunting  occured with Valsalva equivalent.     Neuro/Psych negative neurological ROS  negative  psych ROS   GI/Hepatic Neg liver ROS,GERD  ,,  Endo/Other  diabetes, Type 2, Oral Hypoglycemic Agents    Renal/GU negative Renal ROS  negative genitourinary   Musculoskeletal  (+) Arthritis ,    Abdominal   Peds  Hematology negative hematology ROS (+)   Anesthesia Other Findings   Reproductive/Obstetrics                             Anesthesia Physical Anesthesia Plan  ASA: 3  Anesthesia Plan: General   Post-op Pain Management: Minimal or no pain anticipated   Induction: Intravenous  PONV Risk Score and Plan: Propofol infusion and Treatment may vary due to age or medical condition  Airway Management Planned: Natural Airway  Additional Equipment:   Intra-op Plan:   Post-operative Plan:   Informed Consent: I have reviewed the patients History and Physical, chart, labs and discussed the procedure including the risks, benefits and alternatives for the proposed anesthesia with the patient or authorized representative who has indicated his/her understanding and acceptance.     Dental advisory given  Plan Discussed with: CRNA  Anesthesia Plan Comments:        Anesthesia Quick Evaluation

## 2022-10-18 ENCOUNTER — Ambulatory Visit (HOSPITAL_COMMUNITY)
Admission: RE | Admit: 2022-10-18 | Discharge: 2022-10-18 | Disposition: A | Discharge status: Home / Self Care | Payer: PPO | Attending: Internal Medicine | Admitting: Internal Medicine

## 2022-10-18 ENCOUNTER — Encounter (HOSPITAL_COMMUNITY): Payer: Self-pay | Admitting: Internal Medicine

## 2022-10-18 ENCOUNTER — Encounter: Payer: Self-pay | Admitting: Adult Health

## 2022-10-18 ENCOUNTER — Other Ambulatory Visit: Payer: Self-pay

## 2022-10-18 ENCOUNTER — Encounter (HOSPITAL_COMMUNITY)
Admission: RE | Disposition: A | Discharge status: Home / Self Care | Payer: Self-pay | Source: Home / Self Care | Attending: Internal Medicine

## 2022-10-18 ENCOUNTER — Ambulatory Visit (HOSPITAL_COMMUNITY): Payer: PPO | Admitting: Anesthesiology

## 2022-10-18 ENCOUNTER — Ambulatory Visit (HOSPITAL_BASED_OUTPATIENT_CLINIC_OR_DEPARTMENT_OTHER): Payer: PPO | Admitting: Anesthesiology

## 2022-10-18 DIAGNOSIS — I2510 Atherosclerotic heart disease of native coronary artery without angina pectoris: Secondary | ICD-10-CM | POA: Diagnosis not present

## 2022-10-18 DIAGNOSIS — I10 Essential (primary) hypertension: Secondary | ICD-10-CM

## 2022-10-18 DIAGNOSIS — Z7984 Long term (current) use of oral hypoglycemic drugs: Secondary | ICD-10-CM | POA: Diagnosis not present

## 2022-10-18 DIAGNOSIS — E079 Disorder of thyroid, unspecified: Secondary | ICD-10-CM | POA: Diagnosis not present

## 2022-10-18 DIAGNOSIS — Z87891 Personal history of nicotine dependence: Secondary | ICD-10-CM | POA: Diagnosis not present

## 2022-10-18 DIAGNOSIS — I4891 Unspecified atrial fibrillation: Secondary | ICD-10-CM

## 2022-10-18 DIAGNOSIS — J449 Chronic obstructive pulmonary disease, unspecified: Secondary | ICD-10-CM | POA: Insufficient documentation

## 2022-10-18 DIAGNOSIS — G4730 Sleep apnea, unspecified: Secondary | ICD-10-CM | POA: Insufficient documentation

## 2022-10-18 DIAGNOSIS — E119 Type 2 diabetes mellitus without complications: Secondary | ICD-10-CM | POA: Diagnosis not present

## 2022-10-18 DIAGNOSIS — I4819 Other persistent atrial fibrillation: Secondary | ICD-10-CM

## 2022-10-18 HISTORY — PX: CARDIOVERSION: SHX1299

## 2022-10-18 LAB — GLUCOSE, CAPILLARY: Glucose-Capillary: 122 mg/dL — ABNORMAL HIGH (ref 70–99)

## 2022-10-18 SURGERY — CARDIOVERSION
Anesthesia: General

## 2022-10-18 MED ORDER — SODIUM CHLORIDE 0.9 % IV SOLN: INTRAVENOUS | Status: DC

## 2022-10-18 MED ORDER — LIDOCAINE 2% (20 MG/ML) 5 ML SYRINGE
INTRAMUSCULAR | Status: DC | PRN
  Administered 2022-10-18: 80 mg via INTRAVENOUS

## 2022-10-18 MED ORDER — PROPOFOL 10 MG/ML IV BOLUS
INTRAVENOUS | Status: DC | PRN
  Administered 2022-10-18: 30 mg via INTRAVENOUS
  Administered 2022-10-18: 70 mg via INTRAVENOUS
  Administered 2022-10-18: 30 mg via INTRAVENOUS

## 2022-10-18 NOTE — Discharge Instructions (Signed)

## 2022-10-18 NOTE — Telephone Encounter (Signed)
Pt is calling back about his medication that was not sent in on Friday and want a call back also stated he talk to [place] .

## 2022-10-18 NOTE — CV Procedure (Signed)
Procedure: Electrical Cardioversion Indications:  Atrial Fibrillation  Procedure Details:  Consent: Risks of procedure as well as the alternatives and risks of each were explained to the (patient/caregiver).  Consent for procedure obtained.  Time Out: Verified patient identification, verified procedure, site/side was marked, verified correct patient position, special equipment/implants available, medications/allergies/relevent history reviewed, required imaging and test results available. PERFORMED.  Patient placed on cardiac monitor, pulse oximetry, supplemental oxygen as necessary.  Sedation given:  propofol per anesthesia Pacer pads placed anterior and posterior chest.  Cardioverted 2 time(s).  Cardioversion with synchronized biphasic 120J, 200J shock.  Evaluation: Findings: Post procedure EKG shows: NSR Complications: None Patient did tolerate procedure well.  Time Spent Directly with the Patient:  30 minutes   Elouise Munroe 10/18/2022, 8:44 AM

## 2022-10-18 NOTE — Interval H&P Note (Signed)
History and Physical Interval Note:  10/18/2022 8:29 AM  Jeffrey Price  has presented today for surgery, with the diagnosis of AFIB.  The various methods of treatment have been discussed with the patient and family. After consideration of risks, benefits and other options for treatment, the patient has consented to  Procedure(s): CARDIOVERSION (N/A) as a surgical intervention.  The patient's history has been reviewed, patient examined, no change in status, stable for surgery.  I have reviewed the patient's chart and labs.  Questions were answered to the patient's satisfaction.     Elouise Munroe

## 2022-10-18 NOTE — Telephone Encounter (Signed)
Pt rescheduled the injection, aware that he needs to be on Xarelto for 4 weeks after DCCV. Patient verbalized understanding and agreeable to plan.

## 2022-10-18 NOTE — Transfer of Care (Signed)
Immediate Anesthesia Transfer of Care Note  Patient: Jeffrey Price  Procedure(s) Performed: CARDIOVERSION  Patient Location: Endoscopy Unit  Anesthesia Type:General  Level of Consciousness: awake, alert , and oriented  Airway & Oxygen Therapy: Patient Spontanous Breathing  Post-op Assessment: Report given to RN and Post -op Vital signs reviewed and stable  Post vital signs: Reviewed and stable  Last Vitals:  Vitals Value Taken Time  BP    Temp    Pulse    Resp    SpO2      Last Pain:  Vitals:   10/18/22 0755  TempSrc: Temporal  PainSc: 0-No pain         Complications: No notable events documented.

## 2022-10-19 ENCOUNTER — Other Ambulatory Visit (HOSPITAL_COMMUNITY): Payer: Self-pay

## 2022-10-19 ENCOUNTER — Other Ambulatory Visit: Payer: Self-pay | Admitting: Adult Health

## 2022-10-19 DIAGNOSIS — I10 Essential (primary) hypertension: Secondary | ICD-10-CM

## 2022-10-19 MED ORDER — ATENOLOL 50 MG PO TABS: ORAL_TABLET | ORAL | 2 refills | Status: DC

## 2022-10-19 NOTE — Telephone Encounter (Signed)
Ok to send this in?

## 2022-10-20 NOTE — Anesthesia Postprocedure Evaluation (Signed)
Anesthesia Post Note  Patient: Jeffrey Price  Procedure(s) Performed: CARDIOVERSION     Patient location during evaluation: Endoscopy Anesthesia Type: General Level of consciousness: awake and alert Pain management: pain level controlled Vital Signs Assessment: post-procedure vital signs reviewed and stable Respiratory status: spontaneous breathing, nonlabored ventilation, respiratory function stable and patient connected to nasal cannula oxygen Cardiovascular status: blood pressure returned to baseline and stable Postop Assessment: no apparent nausea or vomiting Anesthetic complications: no  No notable events documented.  Last Vitals:  Vitals:   10/18/22 0849 10/18/22 0900  BP: 119/75 126/81  Pulse: 75 75  Resp: 20 (!) 24  Temp: 36.7 C   SpO2: 94% 94%    Last Pain:  Vitals:   10/18/22 0900  TempSrc:   PainSc: 0-No pain                 Avanthika Dehnert L Oumou Smead

## 2022-10-21 ENCOUNTER — Encounter (HOSPITAL_COMMUNITY): Payer: Self-pay | Admitting: Internal Medicine

## 2022-10-21 ENCOUNTER — Other Ambulatory Visit: Payer: Self-pay | Admitting: Adult Health

## 2022-10-21 ENCOUNTER — Other Ambulatory Visit: Payer: Self-pay

## 2022-10-21 DIAGNOSIS — R7989 Other specified abnormal findings of blood chemistry: Secondary | ICD-10-CM

## 2022-10-21 MED ORDER — FENOFIBRATE 145 MG PO TABS: 145.0000 mg | ORAL_TABLET | Freq: Every day | ORAL | 1 refills | Status: DC

## 2022-10-21 MED ORDER — DAPAGLIFLOZIN PROPANEDIOL 5 MG PO TABS: 5.0000 mg | ORAL_TABLET | Freq: Every day | ORAL | 1 refills | Status: DC

## 2022-10-22 ENCOUNTER — Other Ambulatory Visit: Payer: Self-pay

## 2022-10-22 ENCOUNTER — Other Ambulatory Visit: Payer: Self-pay | Admitting: Adult Health

## 2022-10-22 ENCOUNTER — Other Ambulatory Visit (HOSPITAL_COMMUNITY): Payer: Self-pay

## 2022-10-22 MED ORDER — ATENOLOL 50 MG PO TABS
ORAL_TABLET | ORAL | 2 refills | Status: DC
  Filled 2022-10-22: qty 135, 90d supply, fill #0
  Filled 2023-01-13: qty 135, 90d supply, fill #1

## 2022-10-22 NOTE — Telephone Encounter (Signed)
Spoke to pt not sure what happened but Rx sent again to pharmacy.

## 2022-10-23 ENCOUNTER — Other Ambulatory Visit (HOSPITAL_COMMUNITY): Payer: Self-pay

## 2022-10-25 ENCOUNTER — Encounter: Payer: Self-pay | Admitting: Adult Health

## 2022-10-25 ENCOUNTER — Encounter: Payer: Self-pay | Admitting: Cardiovascular Disease

## 2022-10-25 ENCOUNTER — Ambulatory Visit: Payer: PPO | Attending: Cardiovascular Disease | Admitting: Cardiovascular Disease

## 2022-10-25 VITALS — BP 114/68 | HR 64 | Ht 74.0 in | Wt 284.0 lb

## 2022-10-25 DIAGNOSIS — E0590 Thyrotoxicosis, unspecified without thyrotoxic crisis or storm: Secondary | ICD-10-CM

## 2022-10-25 DIAGNOSIS — E782 Mixed hyperlipidemia: Secondary | ICD-10-CM | POA: Diagnosis not present

## 2022-10-25 DIAGNOSIS — I480 Paroxysmal atrial fibrillation: Secondary | ICD-10-CM | POA: Diagnosis not present

## 2022-10-25 DIAGNOSIS — I10 Essential (primary) hypertension: Secondary | ICD-10-CM

## 2022-10-25 NOTE — Patient Instructions (Signed)
Medication Instructions:  Your physician recommends that you continue on your current medications as directed. Please refer to the Current Medication list given to you today.  *If you need a refill on your cardiac medications before your next appointment, please call your pharmacy*  Lab Work: If you have labs (blood work) drawn today and your tests are completely normal, you will receive your results only by: MyChart Message (if you have MyChart) OR A paper copy in the mail If you have any lab test that is abnormal or we need to change your treatment, we will call you to review the results.  Testing/Procedures: None ordered today.  Follow-Up: At [facility], you and your health needs are our priority.  As part of our continuing mission to provide you with exceptional heart care, we have created designated Provider Care Teams.  These Care Teams include your primary Cardiologist (physician) and Advanced Practice Providers (APPs -  Physician Assistants and Nurse Practitioners) who all work together to provide you with the care you need, when you need it.  We recommend signing up for the patient portal called "MyChart".  Sign up information is provided on this After Visit Summary.  MyChart is used to connect with patients for Virtual Visits (Telemedicine).  Patients are able to view lab/test results, encounter notes, upcoming appointments, etc.  Non-urgent messages can be sent to your provider as well.   To learn more about what you can do with MyChart, go to [email or web address].    Your next appointment:   1 year(s)  The format for your next appointment:   In Person  Provider:   Peter Nishan, MD     Important Information About Sugar       

## 2022-10-26 ENCOUNTER — Other Ambulatory Visit: Payer: PPO

## 2022-10-26 DIAGNOSIS — R7989 Other specified abnormal findings of blood chemistry: Secondary | ICD-10-CM | POA: Diagnosis not present

## 2022-10-27 ENCOUNTER — Other Ambulatory Visit: Payer: Self-pay

## 2022-10-27 ENCOUNTER — Encounter: Payer: Self-pay | Admitting: Cardiovascular Disease

## 2022-10-27 ENCOUNTER — Other Ambulatory Visit: Payer: Self-pay | Admitting: Adult Health

## 2022-10-27 DIAGNOSIS — R7989 Other specified abnormal findings of blood chemistry: Secondary | ICD-10-CM

## 2022-10-27 LAB — TESTOSTERONE TOTAL,FREE,BIO, MALES
Albumin: 4.4 g/dL (ref 3.6–5.1)
Sex Hormone Binding: 21 nmol/L — ABNORMAL LOW (ref 22–77)
Testosterone: 112 ng/dL — ABNORMAL LOW (ref 250–827)

## 2022-10-27 MED ORDER — GLIPIZIDE ER 10 MG PO TB24: 10.0000 mg | ORAL_TABLET | Freq: Every day | ORAL | 0 refills | Status: DC

## 2022-10-27 NOTE — Telephone Encounter (Signed)
FYI

## 2022-10-28 ENCOUNTER — Other Ambulatory Visit: Payer: Self-pay | Admitting: Adult Health

## 2022-10-28 DIAGNOSIS — E0590 Thyrotoxicosis, unspecified without thyrotoxic crisis or storm: Secondary | ICD-10-CM

## 2022-11-10 DIAGNOSIS — G4733 Obstructive sleep apnea (adult) (pediatric): Secondary | ICD-10-CM | POA: Diagnosis not present

## 2022-11-10 DIAGNOSIS — R400 Somnolence: Secondary | ICD-10-CM | POA: Diagnosis not present

## 2022-11-15 ENCOUNTER — Encounter: Payer: Self-pay | Admitting: Cardiovascular Disease

## 2022-11-16 MED ORDER — APIXABAN 5 MG PO TABS: 5.0000 mg | ORAL_TABLET | Freq: Two times a day (BID) | ORAL | 3 refills | Status: DC

## 2022-11-16 NOTE — Telephone Encounter (Signed)
Called patient about his message. Informed patient that we can switch him to eliquis. Will start eliquis 24 hours after patient has stopped xarelto. Will leave samples and 30 day free trail card at front desk for patient to pick up.

## 2022-11-16 NOTE — Telephone Encounter (Signed)
Will send message to PharmD and see if there is a time frame patient needs wait when switching from xarelto to eliquis.

## 2022-11-19 ENCOUNTER — Encounter: Payer: Self-pay | Admitting: Adult Health

## 2022-11-19 ENCOUNTER — Ambulatory Visit: Payer: PPO

## 2022-11-19 DIAGNOSIS — E1142 Type 2 diabetes mellitus with diabetic polyneuropathy: Secondary | ICD-10-CM

## 2022-11-19 MED ORDER — EMPAGLIFLOZIN 10 MG PO TABS: 10.0000 mg | ORAL_TABLET | Freq: Every day | ORAL | 1 refills | Status: DC

## 2022-12-05 ENCOUNTER — Encounter: Payer: Self-pay | Admitting: Adult Health

## 2022-12-06 ENCOUNTER — Telehealth: Payer: Self-pay | Admitting: Adult Health

## 2022-12-06 DIAGNOSIS — G629 Polyneuropathy, unspecified: Secondary | ICD-10-CM

## 2022-12-06 NOTE — Telephone Encounter (Signed)
Patient calling to check progress

## 2022-12-06 NOTE — Telephone Encounter (Signed)
Checking on referrals for Neurology, endocrinology he says were discussed at a previous appointment

## 2022-12-07 ENCOUNTER — Encounter (HOSPITAL_BASED_OUTPATIENT_CLINIC_OR_DEPARTMENT_OTHER): Payer: Self-pay

## 2022-12-07 ENCOUNTER — Other Ambulatory Visit: Payer: Self-pay

## 2022-12-07 ENCOUNTER — Encounter: Payer: Self-pay | Admitting: Cardiovascular Disease

## 2022-12-07 DIAGNOSIS — K219 Gastro-esophageal reflux disease without esophagitis: Secondary | ICD-10-CM

## 2022-12-07 DIAGNOSIS — E0500 Thyrotoxicosis with diffuse goiter without thyrotoxic crisis or storm: Secondary | ICD-10-CM

## 2022-12-07 DIAGNOSIS — E785 Hyperlipidemia, unspecified: Secondary | ICD-10-CM

## 2022-12-07 MED ORDER — CYCLOBENZAPRINE HCL 10 MG PO TABS: 10.0000 mg | ORAL_TABLET | Freq: Two times a day (BID) | ORAL | 3 refills | Status: DC | PRN

## 2022-12-07 MED ORDER — ATORVASTATIN CALCIUM 20 MG PO TABS: 20.0000 mg | ORAL_TABLET | Freq: Every day | ORAL | 3 refills | Status: DC

## 2022-12-07 MED ORDER — PANTOPRAZOLE SODIUM 40 MG PO TBEC: DELAYED_RELEASE_TABLET | ORAL | 1 refills | Status: DC

## 2022-12-07 MED ORDER — MELOXICAM 15 MG PO TABS: ORAL_TABLET | ORAL | 1 refills | Status: AC

## 2022-12-07 MED ORDER — METHIMAZOLE 5 MG PO TABS: 5.0000 mg | ORAL_TABLET | Freq: Every morning | ORAL | 1 refills | Status: DC

## 2022-12-07 MED ORDER — FUROSEMIDE 80 MG PO TABS: 80.0000 mg | ORAL_TABLET | Freq: Two times a day (BID) | ORAL | 0 refills | Status: DC | PRN

## 2022-12-07 NOTE — Progress Notes (Signed)
Noted  

## 2022-12-07 NOTE — Telephone Encounter (Signed)
Endo was denied due to testosterone issue. Not ure what's going on with neurology.    Can you help with this please? What do I need to do?

## 2022-12-07 NOTE — Telephone Encounter (Signed)
Pt requesting these refills before his current insurance ends at midnight 12/08/22

## 2022-12-08 ENCOUNTER — Encounter: Payer: Self-pay | Admitting: Adult Health

## 2022-12-08 ENCOUNTER — Other Ambulatory Visit (HOSPITAL_COMMUNITY): Payer: Self-pay

## 2022-12-08 ENCOUNTER — Ambulatory Visit (INDEPENDENT_AMBULATORY_CARE_PROVIDER_SITE_OTHER): Payer: PPO

## 2022-12-08 VITALS — Ht 74.0 in | Wt 288.0 lb

## 2022-12-08 DIAGNOSIS — Z0000 Encounter for general adult medical examination without abnormal findings: Secondary | ICD-10-CM | POA: Diagnosis not present

## 2022-12-08 NOTE — Telephone Encounter (Signed)
Tried to call pt yesterday but no answer. Everything was refilled except for the atorvastatin since it is filled by Cardio.

## 2022-12-08 NOTE — Telephone Encounter (Signed)
Per pt he is requesting the referral to go to these places

## 2022-12-08 NOTE — Progress Notes (Signed)
Subjective:   Jeffrey Price is a 67 y.o. male who presents for Medicare Annual/Subsequent preventive examination.  Review of Systems   Virtual Visit via Telephone Note  I connected with  Jeffrey Price on 12/08/22 at  2:00 PM EST by telephone and verified that I am speaking with the correct person using two identifiers.  Location: Patient: Home Provider: Office Persons participating in the virtual visit: patient/Nurse Health Advisor   I discussed the limitations, risks, security and privacy concerns of performing an evaluation and management service by telephone and the availability of in person appointments. The patient expressed understanding and agreed to proceed.  Interactive audio and video telecommunications were attempted between this nurse and patient, however failed, due to patient having technical difficulties OR patient did not have access to video capability.  We continued and completed visit with audio only.  Some vital signs may be absent or patient reported.   Criselda Peaches, LPN  Cardiac Risk Factors include: advanced age (>35mn, >>61women);diabetes mellitus;hypertension;male gender     Objective:    Today's Vitals   12/08/22 1406  Weight: 288 lb (130.6 kg)  Height: '6\' 2"'$  (1.88 m)  PainSc: 5    Body mass index is 36.98 kg/m.     12/08/2022    2:18 PM 10/18/2022    7:52 AM 09/10/2022   10:28 AM 12/22/2021   12:19 PM 11/30/2021   11:19 AM 06/15/2021    2:30 PM 06/06/2020   11:41 AM  Advanced Directives  Does Patient Have a Medical Advance Directive? Yes No Yes No No No No  Type of [place]  [place]      Copy of [place]? No - copy requested  No - copy requested      Would patient like information on creating a medical advance directive?    No - Patient declined  No - Patient declined No - Patient declined    Current Medications  (verified) Outpatient Encounter Medications as of 12/08/2022  Medication Sig   acetaminophen (TYLENOL) 325 MG tablet Take 2 tablets (650 mg total) by mouth every 6 (six) hours as needed for mild pain (or Fever >/= 101).   apixaban (ELIQUIS) 5 MG TABS tablet Take 1 tablet (5 mg total) by mouth 2 (two) times daily. START only after being off Xarelto for 24 hours.   atenolol (TENORMIN) 50 MG tablet Take 1 tablet by mouth each morning and 1/2 tablet each evening   atorvastatin (LIPITOR) 20 MG tablet Take 1 tablet (20 mg total) by mouth daily.   blood glucose meter kit and supplies KIT Dispense based on patient and insurance preference. Use up to four times daily as directed.   cetirizine (ZYRTEC) 10 MG tablet Take 10 mg by mouth daily as needed for allergies.   clotrimazole (CLOTRIMAZOLE AF) 1 % cream Apply topically twice daily as needed (rash).   Cyanocobalamin (B-12 SL) Place 1 tablet under the tongue daily.   cyclobenzaprine (FLEXERIL) 10 MG tablet Take 1 tablet (10 mg total) by mouth 2 (two) times daily as needed for muscle spasms.   empagliflozin (JARDIANCE) 10 MG TABS tablet Take 1 tablet (10 mg total) by mouth daily before breakfast.   fenofibrate (TRICOR) 145 MG tablet Take 1 tablet (145 mg total) by mouth daily.   folic acid (FOLVITE) 1 MG tablet Take 1 tablet (1 mg total) by mouth daily.   furosemide (LASIX)  80 MG tablet Take 1 tablet by mouth 2 times daily as needed.   gabapentin (NEURONTIN) 100 MG capsule Take 1 - 3 capsules by mouth 3 times daily as needed for neuropathic pain. (in addition to the 800 mg tablets)   gabapentin (NEURONTIN) 800 MG tablet Take 1 tablet (800 mg total) by mouth 3 (three) times daily.   glipiZIDE (GLUCOTROL XL) 10 MG 24 hr tablet Take 1 tablet (10 mg total) by mouth daily with breakfast.   meloxicam (MOBIC) 15 MG tablet TAKE 1 TABLET BY MOUTH ONCE DAILY AS NEEDED FOR INFLAMMATION   metFORMIN (GLUCOPHAGE-XR) 500 MG 24 hr tablet Take 1 tablet (500 mg total) by  mouth daily with breakfast.   methimazole (TAPAZOLE) 5 MG tablet Take 1 tablet (5 mg total) by mouth every morning.   pantoprazole (PROTONIX) 40 MG tablet TAKE 1 TABLET BY MOUTH DAILY * PT NEEDS APPT   sildenafil (VIAGRA) 100 MG tablet Take 1/2-1 tablet by mouth daily as needed for erectile dysfunction.   tazarotene (AVAGE) 0.1 % cream SMARTSIG:1 Application Topical Every Evening   tretinoin (RETIN-A) 0.05 % cream Apply 1 application. topically daily as needed (oil skin).   triamcinolone cream (KENALOG) 0.1 % Apply to affected area(s) twice daily for 14 days (Patient taking differently: 1 Application 2 (two) times daily as needed (irritation).)   No facility-administered encounter medications on file as of 12/08/2022.    Allergies (verified) Patient has no known allergies.   History: Past Medical History:  Diagnosis Date   Arthritis    in back   Atrial fibrillation (HCC)    Blood clot in vein 1995   bil legs   Bulging lumbar disc    Cataract    mild   Cellulitis 2012   forehead   Complication of anesthesia    had trouble waking up 06/11/2020   DDD (degenerative disc disease), lumbar    Diabetes mellitus without complication (HCC)    GERD (gastroesophageal reflux disease)    Graves disease    Hx of adenomatous colonic polyps 12/15/2017   Hyperlipidemia    Hypertension    Neuromuscular disorder (HCC)    neuropathy in feet   OSA on CPAP    Thyrotoxicosis without thyroid storm    Varicose veins of both lower extremities    Past Surgical History:  Procedure Laterality Date   ANTERIOR CERVICAL DECOMP/DISCECTOMY FUSION N/A 06/18/2021   Procedure: ANTERIOR CERVICAL DECOMPRESSION FUSION CERVICAL4- CERVICAL 5 WITH INSTRUEMENTATION AND ALLOGRAFT;  Surgeon: Phylliss Bob, MD;  Location: [place];  Service: Orthopedics;  Laterality: N/A;   ATRIAL FIBRILLATION ABLATION N/A 09/14/2022   Procedure: ATRIAL FIBRILLATION ABLATION;  Surgeon: Constance Haw, MD;  Location: [place] [facility];  Service: Cardiovascular;  Laterality: N/A;   BACK SURGERY  06/11/2020   BUBBLE STUDY  09/10/2022   Procedure: BUBBLE STUDY;  Surgeon: Werner Lean, MD;  Location: [place];  Service: Cardiovascular;;   CARDIOVERSION N/A 12/22/2021   Procedure: CARDIOVERSION;  Surgeon: Josue Hector, MD;  Location: [facility] ENDOSCOPY;  Service: Cardiovascular;  Laterality: N/A;   CARDIOVERSION N/A 10/18/2022   Procedure: CARDIOVERSION;  Surgeon: Elouise Munroe, MD;  Location[facility] ENDOSCOPY;  Service: Cardiovascular;  Laterality: N/A;   ENDOVENOUS ABLATION SAPHENOUS VEIN W/ LASER Left 10/18/2019   endovenous laser ablation left greater saphenous vein and stab phlebectomy > 20 incisions left leg by Gae Gallop MD    ENDOVENOUS ABLATION SAPHENOUS VEIN W/ LASER Right 11/08/2019   endovenous laser ablation right  greater saphenous vein and stab phlebectomy 10-20 incisions right leg by Gae Gallop MD    LUMBAR EPIDURAL INJECTION     TEE WITHOUT CARDIOVERSION N/A 12/22/2021   Procedure: TRANSESOPHAGEAL ECHOCARDIOGRAM (TEE);  Surgeon: Josue Hector, MD;  Location: [facility] ENDOSCOPY;  Service: Cardiovascular;  Laterality: N/A;   TEE WITHOUT CARDIOVERSION N/A 09/10/2022   Procedure: TRANSESOPHAGEAL ECHOCARDIOGRAM (TEE);  Surgeon: Werner Lean, MD;  Location[facility] ENDOSCOPY;  Service: Cardiovascular;  Laterality: N/A;   Family History  Problem Relation Age of Onset   Thyroid disease Mother    Parkinson's disease Father    Colon cancer Neg Hx    Rectal cancer Neg Hx    Stomach cancer Neg Hx    Esophageal cancer Neg Hx    Social History   Socioeconomic History   Marital status: Significant Other    Spouse name: Not on file   Number of children: 1   Years of education: Not on file   Highest education level: Some college, no degree  Occupational History   Occupation: Retiring  Tobacco Use   Smoking status: Former    Types: E-cigarettes, Cigarettes    Quit date: 12/03/2011    Years  since quitting: 11.0   Smokeless tobacco: Never   Tobacco comments:    Former smoker 10/12/22  Vaping Use   Vaping Use: Every day   Substances: Nicotine, Flavoring  Substance and Sexual Activity   Alcohol use: Yes    Comment: whiskey, Burbon, as few drinks per night and more on weekends 10/12/22   Drug use: No   Sexual activity: Not on file  Other Topics Concern   Not on file  Social History Narrative   Patient is widowed.    He has girlfriend.   Right-handed.   Caffeine use: 2-3 cups per day.      Social Determinants of Health   Financial Resource Strain: Low Risk  (12/08/2022)   Overall Financial Resource Strain (CARDIA)    Difficulty of Paying Living Expenses: Not hard at all  Food Insecurity: No Food Insecurity (12/08/2022)   Hunger Vital Sign    Worried About Running Out of Food in the Last Year: Never true    Ran Out of Food in the Last Year: Never true  Transportation Needs: No Transportation Needs (12/08/2022)   PRAPARE - Hydrologist (Medical): No    Lack of Transportation (Non-Medical): No  Physical Activity: Unknown (12/08/2022)   Exercise Vital Sign    Days of Exercise per Week: 0 days    Minutes of Exercise per Session: Not on file  Stress: No Stress Concern Present (12/08/2022)   [place]    Feeling of Stress : Not at all  Social Connections: Moderately Integrated (12/08/2022)   Social Connection and Isolation Panel [NHANES]    Frequency of Communication with Friends and Family: More than three times a week    Frequency of Social Gatherings with Friends and Family: More than three times a week    Attends Religious Services: More than 4 times per year    Active Member of [employer] or Organizations: Yes    Attends Archivist Meetings: More than 4 times per year    Marital Status: Widowed    Tobacco Counseling Counseling given: Not Answered Tobacco comments:  Former smoker 10/12/22   Clinical Intake:  Pre-visit preparation completed: Yes  Pain : 0-10 Pain Score: 5  Pain Type: Chronic  pain Pain Location: Back Pain Orientation: Lower, Upper Pain Descriptors / Indicators: Constant Pain Onset: Other (comment) Pain Frequency: Constant Pain Relieving Factors: Rx Med Effect of Pain on Daily Activities: Somewhat Nutrition Risk Assessment:  Has the patient had any N/V/D within the last 2 months?  No  Does the patient have any non-healing wounds?  No  Has the patient had any unintentional weight loss or weight gain?  No   Diabetes:  Is the patient diabetic?  Yes  If diabetic, was a CBG obtained today?  Yes CBG 118 Taken by patient Did the patient bring in their glucometer from home?  No  How often do you monitor your CBG's? 3 x Weekly.   Financial Strains and Diabetes Management:  Are you having any financial strains with the device, your supplies or your medication? No .  Does the patient want to be seen by Chronic Care Management for management of their diabetes?  No  Would the patient like to be referred to a Nutritionist or for Diabetic Management?  No   Diabetic Exams:  Diabetic Eye Exam: Completed No. Overdue for diabetic eye exam. Pt has been advised about the importance in completing this exam. A referral has been placed today. Message sent to referral coordinator for scheduling purposes. Advised pt to expect a call from office referred to regarding appt.  Diabetic Foot Exam: Completed No. Pt has been advised about the importance in completing this exam. Pt is scheduled for diabetic foot exam on Followed by PCP.   Pain Relieving Factors: Rx Med  BMI - recorded: 36.98 Nutritional Status: BMI > 30  Obese Nutritional Risks: None Diabetes: Yes CBG done?: Yes (CBG 118 Taken by patient) CBG resulted in Enter/ Edit results?: Yes Did pt. bring in CBG monitor from home?: No  How often do you need to have someone help you when you  read instructions, pamphlets, or other written materials from your doctor or pharmacy?: 1 - Never  Diabetic?  Yes  Interpreter Needed?: No  Information entered by :: Rolene Arbour LPN   Activities of Daily Living    12/08/2022    2:15 PM 12/07/2022   12:42 PM  In your present state of health, do you have any difficulty performing the following activities:  Hearing? 0 0  Vision? 0 0  Difficulty concentrating or making decisions? 0 0  Walking or climbing stairs? 1 1  Comment Due to Orthopedic conditions/ Lower Back. Followed by PCP   Dressing or bathing? 0 0  Doing errands, shopping? 0 0  Preparing Food and eating ? N N  Using the Toilet? N N  In the past six months, have you accidently leaked urine? N N  Do you have problems with loss of bowel control? N N  Managing your Medications? N N  Managing your Finances? N N  Housekeeping or managing your Housekeeping? Aggie Moats    Patient Care Team: Dorothyann Peng, NP as PCP - General (Family Medicine) Josue Hector, MD as PCP - Cardiology (Cardiology) Patient, No Pcp Per (General Practice)  Indicate any recent Medical Services you may have received from other than [facility] providers in the past year (date may be approximate).     Assessment:   This is a routine wellness examination for Kaien.  Hearing/Vision screen Hearing Screening - Comments:: Denies hearing difficulties   Vision Screening - Comments:: Wears rx glasses - up to date with routine eye exams with  [facility]  Dietary issues and exercise  activities discussed: Current Exercise Habits: The patient does not participate in regular exercise at present, Exercise limited by: orthopedic condition(s)   Goals Addressed               This Visit's Progress     No current goals (pt-stated)         Depression Screen    12/08/2022    2:13 PM 01/06/2022   10:47 AM 12/07/2021   12:20 PM 10/09/2021   12:36 PM 12/29/2018    3:33 PM 11/29/2018    3:28 PM 09/29/2018   10:33  AM  PHQ 2/9 Scores  PHQ - 2 Score 0 0 3 1 0 0 0  PHQ- 9 Score  2 14        Fall Risk    12/08/2022    2:17 PM 12/07/2022   12:42 PM 12/05/2021    1:18 PM 12/29/2018    3:33 PM 11/29/2018    3:28 PM  [place] in the past year? 0 0 0 0 0  Number falls in past yr: 0   0   Injury with Fall? 0 0  0   Risk for fall due to : No Fall Risks      Follow up Falls prevention discussed   Falls evaluation completed     FALL RISK PREVENTION PERTAINING TO THE HOME:  Any stairs in or around the home? Yes  If so, are there any without handrails? No  Home free of loose throw rugs in walkways, pet beds, electrical cords, etc? Yes  Adequate lighting in your home to reduce risk of falls? Yes   ASSISTIVE DEVICES UTILIZED TO PREVENT FALLS:  Life alert? No  Use of a cane, walker or w/c? No  Grab bars in the bathroom? No  Shower chair or bench in shower? No  Elevated toilet seat or a handicapped toilet? Yes  TIMED UP AND GO:  Was the test performed? No . Audio Visit  Cognitive Function:        12/08/2022    2:18 PM  6CIT Screen  What Year? 0 points  What month? 0 points  What time? 0 points  Count back from 20 0 points  Months in reverse 0 points  Repeat phrase 0 points  Total Score 0 points    Immunizations Immunization History  Administered Date(s) Administered   Fluad Quad(high Dose 65+) 07/23/2022   Influenza,inj,Quad PF,6+ Mos 09/29/2018, 07/27/2019, 08/08/2020, 10/09/2021   Influenza-Unspecified 08/13/2017, 12/23/2021   PFIZER(Purple Top)SARS-COV-2 Vaccination 01/26/2020, 02/16/2020, 09/06/2020, 01/13/2022   PNEUMOCOCCAL CONJUGATE-20 10/09/2021   Tdap 03/31/2018   Zoster Recombinat (Shingrix) 01/13/2022, 03/12/2022    TDAP status: Up to date  Flu Vaccine status: Up to date  Pneumococcal vaccine status: Up to date  Covid-19 vaccine status: Completed vaccines  Qualifies for Shingles Vaccine? Yes   Zostavax completed Yes   Shingrix Completed?:  Yes  Screening Tests Health Maintenance  Topic Date Due   COVID-19 Vaccine (5 - 2023-24 season) 12/24/2022 (Originally 07/09/2022)   COLONOSCOPY (Pts 45-29yr Insurance coverage will need to be confirmed)  12/09/2023 (Originally 12/09/2020)   HEMOGLOBIN A1C  04/14/2023   OPHTHALMOLOGY EXAM  06/23/2023   FOOT EXAM  10/13/2023   Diabetic kidney evaluation - eGFR measurement  10/14/2023   Diabetic kidney evaluation - Urine ACR  10/14/2023   Medicare Annual Wellness (AWV)  12/09/2023   DTaP/Tdap/Td (2 - Td or Tdap) 03/31/2028   Pneumonia Vaccine 66 Years old  Completed  INFLUENZA VACCINE  Completed   Hepatitis C Screening  Completed   Zoster Vaccines- Shingrix  Completed   HPV VACCINES  Aged Out    Health Maintenance  There are no preventive care reminders to display for this patient.   Colorectal cancer screening: Referral to GI placed Patient deferred. Pt aware the office will call re: appt.  Lung Cancer Screening: (Low Dose CT Chest recommended if Age 65-80 years, 30 pack-year currently smoking OR have quit w/in 15years.) does not qualify.     Additional Screening:  Hepatitis C Screening: does qualify; Completed 03/31/18  Vision Screening: Recommended annual ophthalmology exams for early detection of glaucoma and other disorders of the eye. Is the patient up to date with their annual eye exam?  Yes  Who is the provider or what is the name of the office in which the patient attends annual eye exams?  [facility] If pt is not established with a provider, would they like to be referred to a provider to establish care? No .   Dental Screening: Recommended annual dental exams for proper oral hygiene  Community Resource Referral / Chronic Care Management:  CRR required this visit?  No   CCM required this visit?  No      Plan:     I have personally reviewed and noted the following in the patient's chart:   Medical and social history Use of alcohol, tobacco or illicit  drugs  Current medications and supplements including opioid prescriptions. Patient is not currently taking opioid prescriptions. Functional ability and status Nutritional status Physical activity Advanced directives List of other physicians Hospitalizations, surgeries, and ER visits in previous 12 months Vitals Screenings to include cognitive, depression, and falls Referrals and appointments  In addition, I have reviewed and discussed with patient certain preventive protocols, quality metrics, and best practice recommendations. A written personalized care plan for preventive services as well as general preventive health recommendations were provided to patient.     Criselda Peaches, LPN   06/02/2034   Nurse Notes: None

## 2022-12-08 NOTE — Patient Instructions (Addendum)
Jeffrey Price , Thank you for taking time to come for your Medicare Wellness Visit. I appreciate your ongoing commitment to your health goals. Please review the following plan we discussed and let me know if I can assist you in the future.   These are the goals we discussed:  Goals       No current goals (pt-stated)        This is a list of the screening recommended for you and due dates:  Health Maintenance  Topic Date Due   COVID-19 Vaccine (5 - 2023-24 season) 12/24/2022*   Colon Cancer Screening  12/09/2023*   Hemoglobin A1C  04/14/2023   Eye exam for diabetics  06/23/2023   Complete foot exam   10/13/2023   Yearly kidney function blood test for diabetes  10/14/2023   Yearly kidney health urinalysis for diabetes  10/14/2023   Medicare Annual Wellness Visit  12/09/2023   DTaP/Tdap/Td vaccine (2 - Td or Tdap) 03/31/2028   Pneumonia Vaccine  Completed   Flu Shot  Completed   Hepatitis C Screening: USPSTF Recommendation to screen - Ages 36-79 yo.  Completed   Zoster (Shingles) Vaccine  Completed   HPV Vaccine  Aged Out  *Topic was postponed. The date shown is not the original due date.    Advanced directives: Please bring a copy of your health care power of attorney and living will to the office to be added to your chart at your convenience.   Conditions/risks identified: None  Next appointment: Follow up in one year for your annual wellness visit.    Preventive Care 66 Years and Older, Male  Preventive care refers to lifestyle choices and visits with your health care provider that can promote health and wellness. What does preventive care include? A yearly physical exam. This is also called an annual well check. Dental exams once or twice a year. Routine eye exams. Ask your health care provider how often you should have your eyes checked. Personal lifestyle choices, including: Daily care of your teeth and gums. Regular physical activity. Eating a healthy diet. Avoiding  tobacco and drug use. Limiting alcohol use. Practicing safe sex. Taking low doses of aspirin every day. Taking vitamin and mineral supplements as recommended by your health care provider. What happens during an annual well check? The services and screenings done by your health care provider during your annual well check will depend on your age, overall health, lifestyle risk factors, and family history of disease. Counseling  Your health care provider may ask you questions about your: Alcohol use. Tobacco use. Drug use. Emotional well-being. Home and relationship well-being. Sexual activity. Eating habits. History of falls. Memory and ability to understand (cognition). Work and work Statistician. Screening  You may have the following tests or measurements: Height, weight, and BMI. Blood pressure. Lipid and cholesterol levels. These may be checked every 5 years, or more frequently if you are over 66 years old. Skin check. Lung cancer screening. You may have this screening every year starting at age 66 if you have a 30-pack-year history of smoking and currently smoke or have quit within the past 15 years. Fecal occult blood test (FOBT) of the stool. You may have this test every year starting at age 66. Flexible sigmoidoscopy or colonoscopy. You may have a sigmoidoscopy every 5 years or a colonoscopy every 10 years starting at age 66. Prostate cancer screening. Recommendations will vary depending on your family history and other risks. Hepatitis C blood test. Hepatitis B  blood test. Sexually transmitted disease (STD) testing. Diabetes screening. This is done by checking your blood sugar (glucose) after you have not eaten for a while (fasting). You may have this done every 1-3 years. Abdominal aortic aneurysm (AAA) screening. You may need this if you are a current or former smoker. Osteoporosis. You may be screened starting at age 66 if you are at high risk. Talk with your health care  provider about your test results, treatment options, and if necessary, the need for more tests. Vaccines  Your health care provider may recommend certain vaccines, such as: Influenza vaccine. This is recommended every year. Tetanus, diphtheria, and acellular pertussis (Tdap, Td) vaccine. You may need a Td booster every 10 years. Zoster vaccine. You may need this after age 10. Pneumococcal 13-valent conjugate (PCV13) vaccine. One dose is recommended after age 48. Pneumococcal polysaccharide (PPSV23) vaccine. One dose is recommended after age 47. Talk to your health care provider about which screenings and vaccines you need and how often you need them. This information is not intended to replace advice given to you by your health care provider. Make sure you discuss any questions you have with your health care provider. Document Released: 11/21/2015 Document Revised: 07/14/2016 Document Reviewed: 08/26/2015 Elsevier Interactive Patient Education  2017 [place] Prevention in the Home Falls can cause injuries. They can happen to people of all ages. There are many things you can do to make your home safe and to help prevent falls. What can I do on the outside of my home? Regularly fix the edges of walkways and driveways and fix any cracks. Remove anything that might make you trip as you walk through a door, such as a raised step or threshold. Trim any bushes or trees on the path to your home. Use bright outdoor lighting. Clear any walking paths of anything that might make someone trip, such as rocks or tools. Regularly check to see if handrails are loose or broken. Make sure that both sides of any steps have handrails. Any raised decks and porches should have guardrails on the edges. Have any leaves, snow, or ice cleared regularly. Use sand or salt on walking paths during winter. Clean up any spills in your garage right away. This includes oil or grease spills. What can I do in the  bathroom? Use night lights. Install grab bars by the toilet and in the tub and shower. Do not use towel bars as grab bars. Use non-skid mats or decals in the tub or shower. If you need to sit down in the shower, use a plastic, non-slip stool. Keep the floor dry. Clean up any water that spills on the floor as soon as it happens. Remove soap buildup in the tub or shower regularly. Attach bath mats securely with double-sided non-slip rug tape. Do not have throw rugs and other things on the floor that can make you trip. What can I do in the bedroom? Use night lights. Make sure that you have a light by your bed that is easy to reach. Do not use any sheets or blankets that are too big for your bed. They should not hang down onto the floor. Have a firm chair that has side arms. You can use this for support while you get dressed. Do not have throw rugs and other things on the floor that can make you trip. What can I do in the kitchen? Clean up any spills right away. Avoid walking on wet floors. Keep items  that you use a lot in easy-to-reach places. If you need to reach something above you, use a strong step stool that has a grab bar. Keep electrical cords out of the way. Do not use floor polish or wax that makes floors slippery. If you must use wax, use non-skid floor wax. Do not have throw rugs and other things on the floor that can make you trip. What can I do with my stairs? Do not leave any items on the stairs. Make sure that there are handrails on both sides of the stairs and use them. Fix handrails that are broken or loose. Make sure that handrails are as long as the stairways. Check any carpeting to make sure that it is firmly attached to the stairs. Fix any carpet that is loose or worn. Avoid having throw rugs at the top or bottom of the stairs. If you do have throw rugs, attach them to the floor with carpet tape. Make sure that you have a light switch at the top of the stairs and the  bottom of the stairs. If you do not have them, ask someone to add them for you. What else can I do to help prevent falls? Wear shoes that: Do not have high heels. Have rubber bottoms. Are comfortable and fit you well. Are closed at the toe. Do not wear sandals. If you use a stepladder: Make sure that it is fully opened. Do not climb a closed stepladder. Make sure that both sides of the stepladder are locked into place. Ask someone to hold it for you, if possible. Clearly mark and make sure that you can see: Any grab bars or handrails. First and last steps. Where the edge of each step is. Use tools that help you move around (mobility aids) if they are needed. These include: Canes. Walkers. Scooters. Crutches. Turn on the lights when you go into a dark area. Replace any light bulbs as soon as they burn out. Set up your furniture so you have a clear path. Avoid moving your furniture around. If any of your floors are uneven, fix them. If there are any pets around you, be aware of where they are. Review your medicines with your doctor. Some medicines can make you feel dizzy. This can increase your chance of falling. Ask your doctor what other things that you can do to help prevent falls. This information is not intended to replace advice given to you by your health care provider. Make sure you discuss any questions you have with your health care provider. Document Released: 08/21/2009 Document Revised: 04/01/2016 Document Reviewed: 11/29/2014 Elsevier Interactive Patient Education  2017 [employer].

## 2022-12-08 NOTE — Addendum Note (Signed)
Addended by: Gwenyth Ober R on: 12/08/2022 03:49 PM   Modules accepted: Orders

## 2022-12-08 NOTE — Telephone Encounter (Signed)
Do we need to send in another referral for endo since the other one denied pt due to testosterone?

## 2022-12-08 NOTE — Telephone Encounter (Signed)
Spoke to pt and he stated that he was not able to go to neuro bc he needs a nerve conduction study.

## 2022-12-08 NOTE — Telephone Encounter (Signed)
Pt notified of update.

## 2022-12-08 NOTE — Telephone Encounter (Signed)
Noted  

## 2022-12-08 NOTE — Progress Notes (Signed)
Patient notified of update  and verbalized understanding. 

## 2022-12-08 NOTE — Telephone Encounter (Signed)
[place] for form?

## 2022-12-09 ENCOUNTER — Encounter: Payer: Self-pay | Admitting: Adult Health

## 2022-12-09 ENCOUNTER — Other Ambulatory Visit (HOSPITAL_COMMUNITY): Payer: Self-pay

## 2022-12-09 DIAGNOSIS — E0590 Thyrotoxicosis, unspecified without thyrotoxic crisis or storm: Secondary | ICD-10-CM

## 2022-12-09 DIAGNOSIS — H524 Presbyopia: Secondary | ICD-10-CM | POA: Diagnosis not present

## 2022-12-10 ENCOUNTER — Other Ambulatory Visit (HOSPITAL_COMMUNITY): Payer: Self-pay

## 2022-12-14 ENCOUNTER — Ambulatory Visit: Payer: Medicare HMO | Admitting: Internal Medicine

## 2022-12-14 ENCOUNTER — Encounter: Payer: Self-pay | Admitting: Internal Medicine

## 2022-12-14 VITALS — BP 120/68 | HR 87 | Ht 74.0 in | Wt 284.6 lb

## 2022-12-14 DIAGNOSIS — E0500 Thyrotoxicosis with diffuse goiter without thyrotoxic crisis or storm: Secondary | ICD-10-CM | POA: Diagnosis not present

## 2022-12-14 NOTE — Patient Instructions (Signed)
Please continue methimazole 5 mg daily.  Please stop at the lab.  Please return in 6 months, but possibly sooner for labs.
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
## 2022-12-15 LAB — T3, FREE: T3, Free: 3.4 pg/mL (ref 2.3–4.2)

## 2022-12-15 LAB — TSH: TSH: 1.99 u[IU]/mL (ref 0.35–5.50)

## 2022-12-15 LAB — T4, FREE: Free T4: 0.92 ng/dL (ref 0.60–1.60)

## 2022-12-16 ENCOUNTER — Encounter (HOSPITAL_COMMUNITY): Payer: Self-pay | Admitting: *Deleted

## 2022-12-16 LAB — THYROID STIMULATING IMMUNOGLOBULIN: TSI: <89 % baseline (ref <140)

## 2022-12-17 ENCOUNTER — Ambulatory Visit: Payer: Medicare HMO | Attending: Cardiology | Admitting: Cardiology

## 2022-12-17 ENCOUNTER — Encounter: Payer: Self-pay | Admitting: Cardiology

## 2022-12-17 VITALS — BP 138/70 | HR 76 | Ht 74.0 in | Wt 285.0 lb

## 2022-12-17 DIAGNOSIS — I10 Essential (primary) hypertension: Secondary | ICD-10-CM

## 2022-12-17 DIAGNOSIS — G4733 Obstructive sleep apnea (adult) (pediatric): Secondary | ICD-10-CM

## 2022-12-17 DIAGNOSIS — D6869 Other thrombophilia: Secondary | ICD-10-CM

## 2022-12-17 DIAGNOSIS — I4819 Other persistent atrial fibrillation: Secondary | ICD-10-CM

## 2022-12-17 MED ORDER — APIXABAN 5 MG PO TABS: 5.0000 mg | ORAL_TABLET | Freq: Two times a day (BID) | ORAL | 0 refills | Status: DC

## 2022-12-17 NOTE — Progress Notes (Signed)
Electrophysiology Office Note   Date:  12/17/2022   ID:  Jeffrey Price, DOB 1956-07-09, MRN [id number]  PCP:  Dorothyann Peng, NP  Cardiologist:  Elissa Hefty Primary Electrophysiologist:  Constance Haw, MD    Chief Complaint: AF   History of Present Illness: Jeffrey Price is a 67 y.o. male who is being seen today for the evaluation of AF at the request of Dorothyann Peng, NP. Presenting today for electrophysiology evaluation.  History seen for hypertension, obesity, hyperlipidemia, hypothyroidism.  He was seen by his primary physician 10/19/2021 due to shortness of breath and was found to be in atrial fibrillation.  He has sleep apnea and is on CPAP.  He is status post atrial fibrillation ablation 09/14/2022.  He did go back into atrial fibrillation is post cardioversion 10/18/2022.  Today, denies symptoms of palpitations, chest pain, shortness of breath, orthopnea, PND, lower extremity edema, claudication, dizziness, presyncope, syncope, bleeding, or neurologic sequela. The patient is tolerating medications without difficulties.  Since his cardioversion he has done well.  He is noted no further episodes of atrial fibrillation.  He does have some shortness of breath but feels that this is due to being overweight.  He is ready to get back and start exercising.    Past Medical History:  Diagnosis Date   Arthritis    in back   Atrial fibrillation (HCC)    Blood clot in vein 1995   bil legs   Bulging lumbar disc    Cataract    mild   Cellulitis 2012   forehead   Complication of anesthesia    had trouble waking up 06/11/2020   DDD (degenerative disc disease), lumbar    Diabetes mellitus without complication (HCC)    GERD (gastroesophageal reflux disease)    Graves disease    Hx of adenomatous colonic polyps 12/15/2017   Hyperlipidemia    Hypertension    Neuromuscular disorder (HCC)    neuropathy in feet   OSA on CPAP    Thyrotoxicosis without thyroid storm    Varicose  veins of both lower extremities    Past Surgical History:  Procedure Laterality Date   ANTERIOR CERVICAL DECOMP/DISCECTOMY FUSION N/A 06/18/2021   Procedure: ANTERIOR CERVICAL DECOMPRESSION FUSION CERVICAL4- CERVICAL 5 WITH INSTRUEMENTATION AND ALLOGRAFT;  Surgeon: Phylliss Bob, MD;  Location: [place];  Service: Orthopedics;  Laterality: N/A;   ATRIAL FIBRILLATION ABLATION N/A 09/14/2022   Procedure: ATRIAL FIBRILLATION ABLATION;  Surgeon: Constance Haw, MD;  Location: [facility];  Service: Cardiovascular;  Laterality: N/A;   BACK SURGERY  06/11/2020   BUBBLE STUDY  09/10/2022   Procedure: BUBBLE STUDY;  Surgeon: Werner Lean, MD;  Location: [place];  Service: Cardiovascular;;   CARDIOVERSION N/A 12/22/2021   Procedure: CARDIOVERSION;  Surgeon: Josue Hector, MD;  Location: [facility];  Service: Cardiovascular;  Laterality: N/A;   CARDIOVERSION N/A 10/18/2022   Procedure: CARDIOVERSION;  Surgeon: Elouise Munroe, MD;  Location: [facility] ENDOSCOPY;  Service: Cardiovascular;  Laterality: N/A;   ENDOVENOUS ABLATION SAPHENOUS VEIN W/ LASER Left 10/18/2019   endovenous laser ablation left greater saphenous vein and stab phlebectomy > 20 incisions left leg by Gae Gallop MD    ENDOVENOUS ABLATION SAPHENOUS VEIN W/ LASER Right 11/08/2019   endovenous laser ablation right greater saphenous vein and stab phlebectomy 10-20 incisions right leg by Gae Gallop MD    LUMBAR EPIDURAL INJECTION     TEE WITHOUT CARDIOVERSION N/A 12/22/2021   Procedure: TRANSESOPHAGEAL ECHOCARDIOGRAM (  TEE);  Surgeon: Josue Hector, MD;  Location: [facility] ENDOSCOPY;  Service: Cardiovascular;  Laterality: N/A;   TEE WITHOUT CARDIOVERSION N/A 09/10/2022   Procedure: TRANSESOPHAGEAL ECHOCARDIOGRAM (TEE);  Surgeon: Werner Lean, MD;  Location: [facility] ENDOSCOPY;  Service: Cardiovascular;  Laterality: N/A;     Current Outpatient Medications  Medication Sig Dispense Refill   acetaminophen  (TYLENOL) 325 MG tablet Take 2 tablets (650 mg total) by mouth every 6 (six) hours as needed for mild pain (or Fever >/= 101).     apixaban (ELIQUIS) 5 MG TABS tablet Take 1 tablet (5 mg total) by mouth 2 (two) times daily. START only after being off Xarelto for 24 hours. 180 tablet 3   atenolol (TENORMIN) 50 MG tablet Take 1 tablet by mouth each morning and 1/2 tablet each evening 90 tablet 2   atorvastatin (LIPITOR) 20 MG tablet Take 1 tablet (20 mg total) by mouth daily. 90 tablet 3   blood glucose meter kit and supplies KIT Dispense based on patient and insurance preference. Use up to four times daily as directed. 1 each 0   cetirizine (ZYRTEC) 10 MG tablet Take 10 mg by mouth daily as needed for allergies.     clotrimazole (CLOTRIMAZOLE AF) 1 % cream Apply topically twice daily as needed (rash). 45 g 2   Cyanocobalamin (B-12 SL) Place 1 tablet under the tongue daily.     cyclobenzaprine (FLEXERIL) 10 MG tablet Take 1 tablet (10 mg total) by mouth 2 (two) times daily as needed for muscle spasms. 90 tablet 3   empagliflozin (JARDIANCE) 10 MG TABS tablet Take 1 tablet (10 mg total) by mouth daily before breakfast. 90 tablet 1   fenofibrate (TRICOR) 145 MG tablet Take 1 tablet (145 mg total) by mouth daily. 90 tablet 1   folic acid (FOLVITE) 1 MG tablet Take 1 tablet (1 mg total) by mouth daily.     furosemide (LASIX) 80 MG tablet Take 1 tablet by mouth 2 times daily as needed. 360 tablet 0   gabapentin (NEURONTIN) 100 MG capsule Take 1 - 3 capsules by mouth 3 times daily as needed for neuropathic pain. (in addition to the 800 mg tablets) 270 capsule 1   gabapentin (NEURONTIN) 800 MG tablet Take 1 tablet (800 mg total) by mouth 3 (three) times daily. 270 tablet 1   meloxicam (MOBIC) 15 MG tablet TAKE 1 TABLET BY MOUTH ONCE DAILY AS NEEDED FOR INFLAMMATION 90 tablet 1   metFORMIN (GLUCOPHAGE-XR) 500 MG 24 hr tablet Take 1 tablet (500 mg total) by mouth daily with breakfast. 90 tablet 0   methimazole  (TAPAZOLE) 5 MG tablet Take 1 tablet (5 mg total) by mouth every morning. 90 tablet 1   pantoprazole (PROTONIX) 40 MG tablet TAKE 1 TABLET BY MOUTH DAILY * PT NEEDS APPT 90 tablet 1   sildenafil (VIAGRA) 100 MG tablet Take 1/2-1 tablet by mouth daily as needed for erectile dysfunction. 30 tablet 2   tazarotene (AVAGE) 0.1 % cream SMARTSIG:1 Application Topical Every Evening     tretinoin (RETIN-A) 0.05 % cream Apply 1 application. topically daily as needed (oil skin). 45 g 3   triamcinolone cream (KENALOG) 0.1 % Apply to affected area(s) twice daily for 14 days (Patient taking differently: 1 Application 2 (two) times daily as needed (irritation).) 45 g 0   glipiZIDE (GLUCOTROL XL) 10 MG 24 hr tablet Take 1 tablet (10 mg total) by mouth daily with breakfast. (Patient not taking: Reported on 12/17/2022) 90 tablet  0   No current facility-administered medications for this visit.    Allergies:   Patient has no known allergies.   Social History:  The patient  reports that he quit smoking about 11 years ago. His smoking use included e-cigarettes and cigarettes. He has never used smokeless tobacco. He reports current alcohol use. He reports that he does not use drugs.   Family History:  The patient's family history includes Parkinson's disease in his father; Thyroid disease in his mother.   ROS:  Please see the history of present illness.   Otherwise, review of systems is positive for none.   All other systems are reviewed and negative.   PHYSICAL EXAM: VS:  BP 138/70   Pulse 76   Ht 6' 2"$  (1.88 m)   Wt 285 lb (129.3 kg)   SpO2 96%   BMI 36.59 kg/m  , BMI Body mass index is 36.59 kg/m. GEN: Well nourished, well developed, in no acute distress  HEENT: normal  Neck: no JVD, carotid bruits, or masses Cardiac: RRR; no murmurs, rubs, or gallops,no edema  Respiratory:  clear to auscultation bilaterally, normal work of breathing GI: soft, nontender, nondistended, + BS MS: no deformity or atrophy   Skin: warm and dry Neuro:  Strength and sensation are intact Psych: euthymic mood, full affect  EKG:  EKG is ordered today. Personal review of the ekg ordered shows sinus rhythm3   Recent Labs: 10/13/2022: ALT 48; BUN 16; Creatinine, Ser 1.10; Hemoglobin 15.6; Platelets 263.0; Potassium 3.8; Sodium 138 12/14/2022: TSH 1.99    Lipid Panel     Component Value Date/Time   CHOL 159 10/13/2022 0908   CHOL 151 11/29/2018 1715   TRIG (H) 10/13/2022 0908    463.0 Triglyceride is over 400; calculations on Lipids are invalid.   HDL 31.60 (L) 10/13/2022 0908   HDL 33 (L) 11/29/2018 1715   CHOLHDL 5 10/13/2022 0908   VLDL 27.2 10/09/2021 1251   LDLCALC 40 10/09/2021 1251   LDLCALC  08/08/2020 1036     Comment:     . LDL cholesterol not calculated. Triglyceride levels greater than 400 mg/dL invalidate calculated LDL results. . Reference range: <100 . Desirable range <100 mg/dL for primary prevention;   <70 mg/dL for patients with CHD or diabetic patients  with > or = 2 CHD risk factors. Marland Kitchen LDL-C is now calculated using the Martin-Hopkins  calculation, which is a validated novel method providing  better accuracy than the Friedewald equation in the  estimation of LDL-C.  Cresenciano Genre et al. Annamaria Helling. [id number]): 2061-2068  (http://education.[employer].com/faq/FAQ164)    LDLDIRECT 71.0 10/13/2022 0908     Wt Readings from Last 3 Encounters:  12/17/22 285 lb (129.3 kg)  12/14/22 284 lb 9.6 oz (129.1 kg)  12/08/22 288 lb (130.6 kg)      Other studies Reviewed: Additional studies/ records that were reviewed today include: TTE 11/05/21  Review of the above records today demonstrates:   1. Left ventricular ejection fraction, by estimation, is 60 to 65%. The  left ventricle has normal function. The left ventricle has no regional  wall motion abnormalities. There is moderate left ventricular hypertrophy.  Left ventricular diastolic  parameters are indeterminate.   2. Right  ventricular systolic function is normal. The right ventricular  size is normal. There is normal pulmonary artery systolic pressure.   3. Left atrial size was mildly dilated.   4. Right atrial size was moderately dilated.   5. The mitral valve is normal in  structure. No evidence of mitral valve  regurgitation. No evidence of mitral stenosis.   6. The aortic valve is normal in structure. Aortic valve regurgitation is  not visualized. No aortic stenosis is present.   7. The inferior vena cava is normal in size with greater than 50%  respiratory variability, suggesting right atrial pressure of 3 mmHg.    ASSESSMENT AND PLAN:  1.  Persistent atrial fibrillation: Currently on Eliquis 5 mg daily.  CHA2DS2-VASc of 3.  Status post ablation 09/14/2022.  He did develop more atrial fibrillation and required cardioversion 10/18/2022.  Since then he has done well without further episodes of atrial fibrillation.  Mildly short of breath but he feels that this is due to his weight.  No changes.  2.  Hypertension: Currently well-controlled  3.  Obesity: Diet and excise encouraged Body mass index is 36.59 kg/m.  4.  Obstructive sleep apnea: CPAP compliance encouraged  5.  Secondary hypercoagulable state: Currently on Eliquis for atrial fibrillation as above  Current medicines are reviewed at length with the patient today.   The patient does not have concerns regarding his medicines.  The following changes were made today:  none  Labs/ tests ordered today include:  Orders Placed This Encounter  Procedures   EKG 12-Lead     Disposition:   FU 6 months  Signed, Domingos Riggi Meredith Leeds, MD  12/17/2022 11:32 AM     [facility] [address] [place] [address] [phone] (office) [phone] (fax)

## 2022-12-17 NOTE — Addendum Note (Signed)
Addended by: Stanton Kidney on: 12/17/2022 11:40 AM   Modules accepted: Orders

## 2022-12-17 NOTE — Patient Instructions (Signed)
Medication Instructions:  Your physician recommends that you continue on your current medications as directed. Please refer to the Current Medication list given to you today.  *If you need a refill on your cardiac medications before your next appointment, please call your pharmacy*   Lab Work: None ordered   Testing/Procedures: None ordered   Follow-Up: At [facility], you and your health needs are our priority.  As part of our continuing mission to provide you with exceptional heart care, we have created designated Provider Care Teams.  These Care Teams include your primary Cardiologist (physician) and Advanced Practice Providers (APPs -  Physician Assistants and Nurse Practitioners) who all work together to provide you with the care you need, when you need it.   Your next appointment:   6 month(s)  The format for your next appointment:   In Person  Provider:   You will follow up in the [facility] located at [facility]. Your provider will be: Clint R. Fenton, PA-C    Thank you for choosing [facility]!!   Trinidad Curet, RN [phone]

## 2022-12-30 ENCOUNTER — Encounter: Payer: Self-pay | Admitting: Cardiology

## 2022-12-30 DIAGNOSIS — I4819 Other persistent atrial fibrillation: Secondary | ICD-10-CM

## 2022-12-31 MED ORDER — APIXABAN 5 MG PO TABS: 5.0000 mg | ORAL_TABLET | Freq: Two times a day (BID) | ORAL | 5 refills | Status: DC

## 2022-12-31 NOTE — Telephone Encounter (Signed)
Prescription refill and Sample request for Eliquis received. Indication: Afib  Last office visit: 12/17/22 ([place])  Scr: 1.10 (10/13/22)  Age: 67 Weight: 129.3kg  Eliquis '5mg'$  twice daily is appropriate dose.  Refill sent to pharmacy.   Pt requesting samples.

## 2023-01-03 ENCOUNTER — Other Ambulatory Visit: Payer: Self-pay | Admitting: Neurology

## 2023-01-03 ENCOUNTER — Encounter: Payer: Self-pay | Admitting: Neurology

## 2023-01-03 DIAGNOSIS — Z9989 Dependence on other enabling machines and devices: Secondary | ICD-10-CM

## 2023-01-03 DIAGNOSIS — G4733 Obstructive sleep apnea (adult) (pediatric): Secondary | ICD-10-CM

## 2023-01-12 ENCOUNTER — Encounter: Payer: Self-pay | Admitting: Neurology

## 2023-01-12 ENCOUNTER — Ambulatory Visit: Payer: Medicare HMO | Admitting: Neurology

## 2023-01-12 VITALS — BP 132/78 | HR 67 | Ht 74.0 in | Wt 290.0 lb

## 2023-01-12 DIAGNOSIS — G4733 Obstructive sleep apnea (adult) (pediatric): Secondary | ICD-10-CM

## 2023-01-12 DIAGNOSIS — G4734 Idiopathic sleep related nonobstructive alveolar hypoventilation: Secondary | ICD-10-CM | POA: Diagnosis not present

## 2023-01-12 DIAGNOSIS — I480 Paroxysmal atrial fibrillation: Secondary | ICD-10-CM | POA: Diagnosis not present

## 2023-01-12 DIAGNOSIS — I7121 Aneurysm of the ascending aorta, without rupture: Secondary | ICD-10-CM

## 2023-01-12 DIAGNOSIS — G4739 Other sleep apnea: Secondary | ICD-10-CM

## 2023-01-12 DIAGNOSIS — J439 Emphysema, unspecified: Secondary | ICD-10-CM | POA: Diagnosis not present

## 2023-01-12 DIAGNOSIS — E1142 Type 2 diabetes mellitus with diabetic polyneuropathy: Secondary | ICD-10-CM

## 2023-01-12 DIAGNOSIS — G4731 Primary central sleep apnea: Secondary | ICD-10-CM

## 2023-01-12 MED ORDER — LIDOCAINE-PRILOCAINE 2.5-2.5 % EX CREA: 1.0000 | TOPICAL_CREAM | CUTANEOUS | 0 refills | Status: DC | PRN

## 2023-01-12 NOTE — Progress Notes (Signed)
Provider:  Larey Seat, MD  Primary Care Physician:  Dorothyann Peng, NP [address] [place] [address]     Referring Provider: Dorothyann Peng, [place],  [place] [address]          Chief Complaint according to patient   Patient presents with:     New PROBLEM, established as a sleep atient (Initial Visit)     Here for new problem - struggling with "neuropathy" symptoms for 5+ years. He has never had a NCV/EMG completed. He was started on lyrica and currently on gabapentin 800 mg TID. Neither of these medications have completely helped but reduced.  Other Has cpap set up 12/11/21 with [facility]. Due to insurance having to switch to [facility] and will get another machine       HISTORY OF PRESENT ILLNESS:  Jeffrey Price is a 67 y.o. male patient who is here for Referral  01/12/2023 for a new problem.    .  Chief concern according to patient :  01-12-2023: Jeffrey Price was seen by me in December 2022 prior to a sleep study which confirmed severe obstructive sleep apnea and hypopnea syndrome.  He has been started on auto titration CPAP and was last followed up by nurse practitioner [facility] in October 2023.  He is a diabetic with a history of atrial fibrillation which failed to respond to cardioversion and he finally had an ablation in summer last year.  Dr. Harold Hedge line was his primary care physician at the time. He is now here today with an excellent compliance report and residual AHI of 1.0 a setting of 8 to 18 cm water with 3 cm EPR and 80% compliance.  He is to be referred for neuropathy.  This time from [facility] at [place].    Pain , tingling numbness in both feet and hands, feet are too painful to walk barefoot.    The patient has a longstanding history of diabetic disease he is also on Flexeril for degenerative disc disease and muscle spasms and he is s/p anterior fusion.  He also had a lumbar fusion.  His primary care  physician has increased gabapentin which had already been used to treat his neuropathy but he did not feel enough relief from it.  Plan B was to try amitriptyline, which has not been ever implemented. Lyrica was not covered by [facility] and had to change. He also has to change his DME.      16-Dec 2022;reviewed the patient's operative chart he underwent anterior cervical decompression with fusion at cervical level C4-C5 with allograft.  His diagnosis was cervical myelopathy with disc herniation resulting in spinal cord compression.  Surgical date was 06-18-2021.  It was after his anesthesia was completed and the surgery completed that they were mentioning of apneic events being witnessed.I reviewed the patient's operative chart he underwent anterior cervical decompression with fusion at cervical level C4-C5 with allograft.  His diagnosis was cervical myelopathy with disc herniation resulting in spinal cord compression.  Surgical date was 06-18-2021.  It was after his anesthesia was completed and the surgery completed that they were mentioning of apneic events being witnessed. He was diagnosed with atrial fib after SOB was worsening.     Review of Systems: Out of a complete 14 system review, the patient complains of only the following symptoms, and all other reviewed systems are negative.:   Social History   Socioeconomic History  Marital status: Significant Other    Spouse name: Not on file   Number of children: 1   Years of education: Not on file   Highest education level: Some college, no degree  Occupational History   Occupation: Retiring  Tobacco Use   Smoking status: Former    Types: E-cigarettes, Cigarettes    Quit date: 12/03/2011    Years since quitting: 11.1   Smokeless tobacco: Never   Tobacco comments:    Former smoker 10/12/22  Vaping Use   Vaping Use: Every day   Substances: Nicotine, Flavoring  Substance and Sexual Activity   Alcohol use: Yes    Comment: whiskey, Burbon,  as few drinks per night and more on weekends 10/12/22   Drug use: No   Sexual activity: Not on file  Other Topics Concern   Not on file  Social History Narrative   Patient is widowed.    He has girlfriend.   Right-handed.   Caffeine use: 2-3 cups per day.      Social Determinants of Health   Financial Resource Strain: Low Risk  (12/08/2022)   Overall Financial Resource Strain (CARDIA)    Difficulty of Paying Living Expenses: Not hard at all  Food Insecurity: No Food Insecurity (12/08/2022)   Hunger Vital Sign    Worried About Running Out of Food in the Last Year: Never true    Ran Out of Food in the Last Year: Never true  Transportation Needs: No Transportation Needs (12/08/2022)   PRAPARE - Hydrologist (Medical): No    Lack of Transportation (Non-Medical): No  Physical Activity: Unknown (12/08/2022)   Exercise Vital Sign    Days of Exercise per Week: 0 days    Minutes of Exercise per Session: Not on file  Stress: No Stress Concern Present (12/08/2022)   [place]    Feeling of Stress : Not at all  Social Connections: Moderately Integrated (12/08/2022)   Social Connection and Isolation Panel [NHANES]    Frequency of Communication with Friends and Family: More than three times a week    Frequency of Social Gatherings with Friends and Family: More than three times a week    Attends Religious Services: More than 4 times per year    Active Member of [employer] or Organizations: Yes    Attends Archivist Meetings: More than 4 times per year    Marital Status: Widowed    Family History  Problem Relation Age of Onset   Thyroid disease Mother    Parkinson's disease Father    Colon cancer Neg Hx    Rectal cancer Neg Hx    Stomach cancer Neg Hx    Esophageal cancer Neg Hx     Past Medical History:  Diagnosis Date   Arthritis    in back   Atrial fibrillation ([place])    Blood clot  in vein 1995   bil legs   Bulging lumbar disc    Cataract    mild   Cellulitis 2012   forehead   Complication of anesthesia    had trouble waking up 06/11/2020   DDD (degenerative disc disease), lumbar    Diabetes mellitus without complication (HCC)    GERD (gastroesophageal reflux disease)    Graves disease    Hx of adenomatous colonic polyps 12/15/2017   Hyperlipidemia    Hypertension    Neuromuscular disorder (HCC)    neuropathy in feet  OSA on CPAP    Thyrotoxicosis without thyroid storm    Varicose veins of both lower extremities     Past Surgical History:  Procedure Laterality Date   ANTERIOR CERVICAL DECOMP/DISCECTOMY FUSION N/A 06/18/2021   Procedure: ANTERIOR CERVICAL DECOMPRESSION FUSION CERVICAL4- CERVICAL 5 WITH INSTRUEMENTATION AND ALLOGRAFT;  Surgeon: Phylliss Bob, MD;  Location: [facility];  Service: Orthopedics;  Laterality: N/A;   ATRIAL FIBRILLATION ABLATION N/A 09/14/2022   Procedure: ATRIAL FIBRILLATION ABLATION;  Surgeon: Constance Haw, MD;  Location: [facility];  Service: Cardiovascular;  Laterality: N/A;   BACK SURGERY  06/11/2020   BUBBLE STUDY  09/10/2022   Procedure: BUBBLE STUDY;  Surgeon: Werner Lean, MD;  Location: [facility];  Service: Cardiovascular;;   CARDIOVERSION N/A 12/22/2021   Procedure: CARDIOVERSION;  Surgeon: Josue Hector, MD;  Location: [facility] ENDOSCOPY;  Service: Cardiovascular;  Laterality: N/A;   CARDIOVERSION N/A 10/18/2022   Procedure: CARDIOVERSION;  Surgeon: Elouise Munroe, MD;  Location: [facility] ENDOSCOPY;  Service: Cardiovascular;  Laterality: N/A;   ENDOVENOUS ABLATION SAPHENOUS VEIN W/ LASER Left 10/18/2019   endovenous laser ablation left greater saphenous vein and stab phlebectomy > 20 incisions left leg by Gae Gallop MD    ENDOVENOUS ABLATION SAPHENOUS VEIN W/ LASER Right 11/08/2019   endovenous laser ablation right greater saphenous vein and stab phlebectomy 10-20 incisions right leg by Gae Gallop  MD    LUMBAR EPIDURAL INJECTION     TEE WITHOUT CARDIOVERSION N/A 12/22/2021   Procedure: TRANSESOPHAGEAL ECHOCARDIOGRAM (TEE);  Surgeon: Josue Hector, MD;  Location: [facility] ENDOSCOPY;  Service: Cardiovascular;  Laterality: N/A;   TEE WITHOUT CARDIOVERSION N/A 09/10/2022   Procedure: TRANSESOPHAGEAL ECHOCARDIOGRAM (TEE);  Surgeon: Werner Lean, MD;  Location: [facility] ENDOSCOPY;  Service: Cardiovascular;  Laterality: N/A;     Current Outpatient Medications on File Prior to Visit  Medication Sig Dispense Refill   acetaminophen (TYLENOL) 325 MG tablet Take 2 tablets (650 mg total) by mouth every 6 (six) hours as needed for mild pain (or Fever >/= 101).     apixaban (ELIQUIS) 5 MG TABS tablet Take 1 tablet (5 mg total) by mouth 2 (two) times daily. 60 tablet 5   atenolol (TENORMIN) 50 MG tablet Take 1 tablet by mouth each morning and 1/2 tablet each evening 90 tablet 2   atorvastatin (LIPITOR) 20 MG tablet Take 1 tablet (20 mg total) by mouth daily. 90 tablet 3   blood glucose meter kit and supplies KIT Dispense based on patient and insurance preference. Use up to four times daily as directed. 1 each 0   cetirizine (ZYRTEC) 10 MG tablet Take 10 mg by mouth daily as needed for allergies.     clotrimazole (CLOTRIMAZOLE AF) 1 % cream Apply topically twice daily as needed (rash). 45 g 2   Cyanocobalamin (B-12 SL) Place 1 tablet under the tongue daily.     cyclobenzaprine (FLEXERIL) 10 MG tablet Take 1 tablet (10 mg total) by mouth 2 (two) times daily as needed for muscle spasms. 90 tablet 3   empagliflozin (JARDIANCE) 10 MG TABS tablet Take 1 tablet (10 mg total) by mouth daily before breakfast. 90 tablet 1   fenofibrate (TRICOR) 145 MG tablet Take 1 tablet (145 mg total) by mouth daily. 90 tablet 1   folic acid (FOLVITE) 1 MG tablet Take 1 tablet (1 mg total) by mouth daily.     furosemide (LASIX) 80 MG tablet Take 1 tablet by mouth 2 times daily as needed.  360 tablet 0   gabapentin (NEURONTIN)  800 MG tablet Take 1 tablet (800 mg total) by mouth 3 (three) times daily. 270 tablet 1   glipiZIDE (GLUCOTROL XL) 10 MG 24 hr tablet Take 1 tablet (10 mg total) by mouth daily with breakfast. 90 tablet 0   meloxicam (MOBIC) 15 MG tablet TAKE 1 TABLET BY MOUTH ONCE DAILY AS NEEDED FOR INFLAMMATION 90 tablet 1   methimazole (TAPAZOLE) 5 MG tablet Take 1 tablet (5 mg total) by mouth every morning. 90 tablet 1   pantoprazole (PROTONIX) 40 MG tablet TAKE 1 TABLET BY MOUTH DAILY * PT NEEDS APPT 90 tablet 1   sildenafil (VIAGRA) 100 MG tablet Take 1/2-1 tablet by mouth daily as needed for erectile dysfunction. 30 tablet 2   tazarotene (AVAGE) 0.1 % cream SMARTSIG:1 Application Topical Every Evening     tretinoin (RETIN-A) 0.05 % cream Apply 1 application. topically daily as needed (oil skin). 45 g 3   triamcinolone cream (KENALOG) 0.1 % Apply to affected area(s) twice daily for 14 days (Patient taking differently: 1 Application 2 (two) times daily as needed (irritation).) 45 g 0   metFORMIN (GLUCOPHAGE-XR) 500 MG 24 hr tablet Take 1 tablet (500 mg total) by mouth daily with breakfast. 90 tablet 0   No current facility-administered medications on file prior to visit.    No Known Allergies   DIAGNOSTIC DATA (LABS, IMAGING, TESTING) - I reviewed patient records, labs, notes, testing and imaging myself where available.  Lab Results  Component Value Date   WBC 8.5 10/13/2022   HGB 15.6 10/13/2022   HCT 46.0 10/13/2022   MCV 90.7 10/13/2022   PLT 263.0 10/13/2022      Component Value Date/Time   NA 138 10/13/2022 0908   NA 136 04/29/2022 1222   K 3.8 10/13/2022 0908   CL 97 10/13/2022 0908   CO2 28 10/13/2022 0908   GLUCOSE 136 (H) 10/13/2022 0908   BUN 16 10/13/2022 0908   BUN 16 04/29/2022 1222   CREATININE 1.10 10/13/2022 0908   CREATININE 0.82 08/08/2020 1036   CALCIUM 9.3 10/13/2022 0908   PROT 7.5 10/13/2022 0908   PROT 7.4 11/29/2018 1715   ALBUMIN 4.4 10/13/2022 0908   ALBUMIN  4.4 11/29/2018 1715   AST 39 (H) 10/13/2022 0908   ALT 48 10/13/2022 0908   ALKPHOS 104 10/13/2022 0908   BILITOT 0.8 10/13/2022 0908   BILITOT 0.4 11/29/2018 1715   GFRNONAA >60 09/14/2022 1232   GFRAA >60 06/06/2020 1156   Lab Results  Component Value Date   CHOL 159 10/13/2022   HDL 31.60 (L) 10/13/2022   LDLCALC 40 10/09/2021   LDLDIRECT 71.0 10/13/2022   TRIG (H) 10/13/2022    463.0 Triglyceride is over 400; calculations on Lipids are invalid.   CHOLHDL 5 10/13/2022   Lab Results  Component Value Date   HGBA1C 7.1 (H) 10/13/2022   Lab Results  Component Value Date   [id number] 01/06/2022   Lab Results  Component Value Date   TSH 1.99 12/14/2022    PHYSICAL EXAM:  Today's Vitals   01/12/23 1036  BP: 132/78  Pulse: 67  Weight: 290 lb (131.5 kg)  Height: '6\' 2"'$  (1.88 m)   Body mass index is 37.23 kg/m.   Wt Readings from Last 3 Encounters:  01/12/23 290 lb (131.5 kg)  12/17/22 285 lb (129.3 kg)  12/14/22 284 lb 9.6 oz (129.1 kg)     Ht Readings from Last 3 Encounters:  01/12/23 '6\' 2"'$  (1.88 m)  12/17/22 '6\' 2"'$  (1.88 m)  12/14/22 '6\' 2"'$  (1.88 m)     General: The patient is awake, alert and appears not in acute distress. The patient is well groomed. Head: Normocephalic, atraumatic. Neck is supple. Mallampati 2 - 3 peaked and narrow palate. ,  neck circumference:20 inches . Nasal airflow was  patent.  Retrognathia is not seen.  Dental status: intact Cardiovascular:  Regular rate and cardiac rhythm by pulse,  without distended neck veins. Respiratory: Lungs are clear to auscultation.  Skin:  With evidence of ankle edema- more on the right foot  than left-   red palms and red feet, flushed face.    Neurologic exam : The patient is awake and alert, oriented to place and time.   Memory subjective described as intact.  Attention span & concentration ability appears normal.  Speech is fluent,  with, dysphonia.  Mood and affect are appropriate.   Cranial  nerves: no loss of smell or taste reported  Pupils are equal and not reactive to light. 2-3 mm wide.  Funduscopic exam deferred. .  Extraocular movements in vertical and horizontal planes were intact and without nystagmus. No Diplopia. Visual fields by finger perimetry are intact. Hearing was impaired to soft voice and finger rubbing.  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   Facial sensation intact to fine touch.  Facial motor strength is symmetric/ tongue and uvula move midline.  Neck ROM : rotation, tilt and flexion extension were normal for age and shoulder shrug was symmetrical.    Motor exam:  Symmetric bulk, tone and ROM.   Normal tone without cog wheeling, symmetric grip strength .   Sensory:  Fine touch, and vibration were tested  and  severely decreased in both feet- diabetic neuropathy.   Vibration was not felt on left foot at all. Feet an hands tingling- this is neuropathy in his hands. THIS is ascending and is basically the same examination result since 2022.  Proprioception tested in the upper extremities was normal.   Coordination: Rapid alternating movements in the fingers/hands were of normal speed.  The Finger-to-nose maneuver was intact without evidence of ataxia, dysmetria or tremor.   Gait and station: Patient could rise unassisted from a seated position, walked without assistive device. Left arm less arm swing and limp- left sciatic pain.  Stance is of normal width/ base and the patient turned with 5 steps.  Toe and heel walk were deferred.  Deep tendon reflexes: in the upper extremities are absent in both legs,     After spending a total time of 45 minutes face to face and additional time for physical and neurologic examination, review of laboratory studies,  personal review of imaging studies, reports and results of other testing and review of referral information / records as far as provided in visit, I have established the following assessments:    ASSESSMENT AND  PLAN 67 y.o. year old male  here with:    1) progression of known diabetic neuropathy. I would neither further increase gabapentin, may change back to Lyrica ( financial reasons, costs) ,EKG changes potentially risky on Amitriptyline. He may have a contributory factor of THYROID disease , on Tapazole. EMLA or lidocaine cream topically suggested.    2) gait instability due to neuropathy and due to DDD, low back , sciatica. This radiculopathic pain can also ad to pain. There are no brisk reflexes to suggest upper motor neuron damage.   3) dropping things from  either hand. Left hand slower with weaker grip and less sensation. Neuropathy.   4) currently atrial fib is controlled, it causes SOB when active .  Mr Barse was well aware that we do not have a NCV technician, but we have a trainee. I will order a NCV and EMG for the left leg and arm. lidocaine cream to be applied topically to both feet to help with pain relief.  Compression stockings can help with pain and reduce edema. I like for him to restart using them.   I plan to follow up either personally or through our NP within 6 months, follow up on NCV/ EMG.   CC: I will share my notes with PCP .  After spending a total time of  45  minutes face to face and additional time for physical and neurologic examination, review of laboratory studies,  personal review of imaging studies, reports and results of other testing and review of referral information / records as far as provided in visit,   Electronically signed by: Larey Seat, MD 01/12/2023 10:58 AM  [facility] [place] certified by The [employer] of Sleep Medicine and Diplomate of the [employer] of Sleep Medicine. Board certified In Neurology through the [place], Fellow of the [employer] of Neurology. Medical Director of [employer].

## 2023-01-12 NOTE — Patient Instructions (Signed)
Diabetic Neuropathy Diabetic neuropathy refers to nerve damage that is caused by diabetes. Over time, people with diabetes can develop nerve damage throughout the body. There are several types of diabetic neuropathy: Peripheral neuropathy. This is the most common type of diabetic neuropathy. It damages the nerves that carry signals between the spinal cord and other parts of the body (peripheral nerves). This usually affects nerves in the feet, legs, hands, and arms. Autonomic neuropathy. This type causes damage to nerves that control involuntary functions (autonomic nerves). Involuntary functions are functions of the body that you do not control. They include heartbeat, body temperature, blood pressure, urination, digestion, sweating, sexual function, or response to changes in blood glucose. Focal neuropathy. This type of nerve damage affects one area of the body, such as an arm, a leg, or the face. The injury may involve one nerve or a small group of nerves. Focal neuropathy can be painful and unpredictable. It occurs most often in older adults with diabetes. This often develops suddenly, but usually improves over time and does not cause long-term problems. Proximal neuropathy. This type of nerve damage affects the nerves of the thighs, hips, buttocks, or legs. It causes severe pain, weakness, and muscle death (atrophy), usually in the thigh muscles. It is more common among older men and people who have type 2 diabetes. The length of recovery time may vary. What are the causes? Peripheral, autonomic, and focal neuropathies are caused by diabetes that is not well controlled with treatment. The cause of proximal neuropathy is not known, but it may be caused by inflammation related to uncontrolled blood glucose levels. What are the signs or symptoms? Peripheral neuropathy Peripheral neuropathy develops slowly over time. When the nerves of the feet and legs no longer work, you may experience: Burning,  stabbing, or aching pain in the legs or feet. Pain or cramping in the legs or feet. Loss of feeling (numbness) and inability to feel pressure or pain in the feet. This can lead to: Thick calluses or sores on areas of constant pressure. Ulcers. Reduced ability to feel temperature changes. Foot deformities. Muscle weakness. Loss of balance or coordination. Autonomic neuropathy The symptoms of autonomic neuropathy vary depending on which nerves are affected. Symptoms may include: Problems with digestion, such as: Nausea or vomiting. Poor appetite. Bloating. Diarrhea or constipation. Trouble swallowing. Losing weight without trying to. Problems with the heart, blood, and lungs, such as: Dizziness, especially when standing up. Fainting. Shortness of breath. Irregular heartbeat. Bladder problems, such as: Trouble starting or stopping urination. Leaking urine. Trouble emptying the bladder. Urinary tract infections (UTIs). Problems with other body functions, such as: Sweat. You may sweat too much or too little. Temperature. You might get hot easily. Or, you might feel cold more than usual. Sexual function. Men may not be able to get or maintain an erection. Women may have vaginal dryness and difficulty with arousal. Focal neuropathy Symptoms affect only one area of the body. Common symptoms include: Numbness. Tingling. Burning pain. Prickling feeling. Very sensitive skin. Weakness. Inability to move (paralysis). Muscle twitching. Muscles getting smaller (wasting). Poor coordination. Double or blurred vision. Proximal neuropathy Sudden, severe pain in the hip, thigh, or buttocks. Pain may spread from the back into the legs (sciatica). Pain and numbness in the arms and legs. Tingling. Loss of bladder control or bowel control. Weakness and wasting of thigh muscles. Difficulty getting up from a seated position. Abdominal swelling. Unexplained weight loss. How is this  diagnosed? Diagnosis varies depending on the type   of neuropathy your health care provider suspects. Peripheral neuropathy Your health care provider will do a neurologic exam. This exam checks your reflexes, how you move, and what you can feel. You may have other tests, such as: Blood tests. Tests of the fluid that surrounds the spinal cord (lumbar puncture). CT scan. MRI. Checking the nerves that control muscles (electromyogram, or EMG). Checking how quickly signals pass through your nerves (nerve conduction study). Checking a small piece of a nerve using a microscope (biopsy). Autonomic neuropathy You may have tests, such as: Tests to measure your blood pressure and heart rate. You may be secured to an exam table that moves you from a lying position to an upright position (table tilt test). Breathing tests to check your lungs. Tests to check how food moves through the digestive system (gastric emptying tests). Blood, sweat, or urine tests. Ultrasound of your bladder. Spinal fluid tests. Focal neuropathy This condition may be diagnosed with: A neurologic exam. CT scan. MRI. EMG. Nerve conduction study. Proximal neuropathy There is no test to diagnose this type of neuropathy. You may have tests to rule out other possible causes of this type of neuropathy. Tests may include: X-rays of your spine and lumbar region. Lumbar puncture. MRI. How is this treated? The goal of treatment is to keep nerve damage from getting worse. Treatment may include: Following your diabetes management plan. This will help keep your blood glucose level and your A1C level within your target range. This is the most important treatment. Using prescription pain medicine. Follow these instructions at home: Diabetes management Follow your diabetes management plan as told by your health care provider. Check your blood glucose levels. Keep your blood glucose in your target range. Have your A1C level checked at  least two times a year, or as often as told. Take over the counter and prescription medicines only as told by your health care provider. This includes insulin and diabetes medicine.  Lifestyle  Do not use any products that contain nicotine or tobacco, such as cigarettes, e-cigarettes, and chewing tobacco. If you need help quitting, ask your health care provider. Be physically active every day. Include strength training and balance exercises. Follow a healthy meal plan. Work with your health care provider to manage your blood pressure. General instructions Ask your health care provider if the medicine prescribed to you requires you to avoid driving or using machinery. Check your skin and feet every day for cuts, bruises, redness, blisters, or sores. Keep all follow-up visits. This is important. Contact a health care provider if: You have burning, stabbing, or aching pain in your legs or feet. You are unable to feel pressure or pain in your feet. You develop problems with digestion, such as: Nausea. Vomiting. Bloating. Constipation. Diarrhea. Abdominal pain. You have difficulty with urination, such as: Inability to control when you urinate (incontinence). Inability to completely empty the bladder (retention). You feel as if your heart is racing (palpitations). You feel dizzy, weak, or faint when you stand up. Get help right away if: You cannot urinate. You have sudden weakness or loss of coordination. You have trouble speaking. You have pain or pressure in your chest. You have an irregular heartbeat. You have sudden inability to move a part of your body. These symptoms may represent a serious problem that is an emergency. Do not wait to see if the symptoms will go away. Get medical help right away. Call your local emergency services (911 in the [place] Do not drive yourself to   the hospital. Summary Diabetic neuropathy is nerve damage that is caused by diabetes. It can cause numbness  and pain in the arms, legs, digestive tract, heart, and other body systems. This condition is treated by keeping your blood glucose level and your A1C level within your target range. This can help prevent neuropathy from getting worse. Check your skin and feet every day for cuts, bruises, redness, blisters, or sores. Do not use any products that contain nicotine or tobacco, such as cigarettes, e-cigarettes, and chewing tobacco. This information is not intended to replace advice given to you by your health care provider. Make sure you discuss any questions you have with your health care provider. Document Revised: 03/06/2020 Document Reviewed: 03/06/2020 Elsevier Patient Education  [place]. Peripheral Neuropathy Peripheral neuropathy is a type of nerve damage. It affects nerves that carry signals between the spinal cord and the arms, legs, and the rest of the body (peripheral nerves). It does not affect nerves in the spinal cord or brain. In peripheral neuropathy, one nerve or a group of nerves may be damaged. Peripheral neuropathy is a broad category that includes many specific nerve disorders, like diabetic neuropathy, hereditary neuropathy, and carpal tunnel syndrome. What are the causes? This condition may be caused by: Certain diseases, such as: Diabetes. This is the most common cause of peripheral neuropathy. Autoimmune diseases, such as rheumatoid arthritis and systemic lupus erythematosus. Nerve diseases that are passed from parent to child (inherited). Kidney disease. Thyroid disease. Other causes may include: Nerve injury. Pressure or stress on a nerve that lasts a long time. Lack (deficiency) of B vitamins. This can result from alcoholism, poor diet, or a restricted diet. Infections. Some medicines, such as cancer medicines (chemotherapy). Poisonous (toxic) substances, such as lead and mercury. Too little blood flowing to the legs. In some cases, the cause of this  condition is not known. What are the signs or symptoms? Symptoms of this condition depend on which of your nerves is damaged. Symptoms in the legs, hands, and arms can include: Loss of feeling (numbness) in the feet, hands, or both. Tingling in the feet, hands, or both. Burning pain. Very sensitive skin. Weakness. Not being able to move a part of the body (paralysis). Clumsiness or poor coordination. Muscle twitching. Loss of balance. Symptoms in other parts of the body can include: Not being able to control your bladder. Feeling dizzy. Sexual problems. How is this diagnosed? Diagnosing and finding the cause of peripheral neuropathy can be difficult. Your health care provider will take your medical history and do a physical exam. A neurological exam will also be done. This involves checking things that are affected by your brain, spinal cord, and nerves (nervous system). For example, your health care provider will check your reflexes, how you move, and what you can feel. You may have other tests, such as: Blood tests. Electromyogram (EMG) and nerve conduction tests. These tests check nerve function and how well the nerves are controlling the muscles. Imaging tests, such as a CT scan or MRI, to rule out other causes of your symptoms. Removing a small piece of nerve to be examined in a lab (nerve biopsy). Removing and examining a small amount of the fluid that surrounds the brain and spinal cord (lumbar puncture). How is this treated? Treatment for this condition may involve: Treating the underlying cause of the neuropathy, such as diabetes, kidney disease, or vitamin deficiencies. Stopping medicines that can cause neuropathy, such as chemotherapy. Medicine to help relieve pain.  Medicines may include: Prescription or over-the-counter pain medicine. Anti-seizure medicine. Antidepressants. Pain-relieving patches that are applied to painful areas of skin. Surgery to relieve pressure on a  nerve or to destroy a nerve that is causing pain. Physical therapy to help improve movement and balance. Devices to help you move around (assistive devices). Follow these instructions at home: Medicines Take over-the-counter and prescription medicines only as told by your health care provider. Do not take any other medicines without first asking your health care provider. Ask your health care provider if the medicine prescribed to you requires you to avoid driving or using machinery. Lifestyle  Do not use any products that contain nicotine or tobacco. These products include cigarettes, chewing tobacco, and vaping devices, such as e-cigarettes. Smoking keeps blood from reaching damaged nerves. If you need help quitting, ask your health care provider. Avoid or limit alcohol. Too much alcohol can cause a vitamin B deficiency, and vitamin B is needed for healthy nerves. Eat a healthy diet. This includes: Eating foods that are high in fiber, such as beans, whole grains, and fresh fruits and vegetables. Limiting foods that are high in fat and processed sugars, such as fried or sweet foods. General instructions  If you have diabetes, work closely with your health care provider to keep your blood sugar under control. If you have numbness in your feet: Check every day for signs of injury or infection. Watch for redness, warmth, and swelling. Wear padded socks and comfortable shoes. These help protect your feet. Develop a good support system. Living with peripheral neuropathy can be stressful. Consider talking with a mental health specialist or joining a support group. Use assistive devices and attend physical therapy as told by your health care provider. This may include using a walker or a cane. Keep all follow-up visits. This is important. Where to find more information [employer] of Neurological Disorders: [employer] Contact a health care provider if: You have new signs or symptoms  of peripheral neuropathy. You are struggling emotionally from dealing with peripheral neuropathy. Your pain is not well controlled. Get help right away if: You have an injury or infection that is not healing normally. You develop new weakness in an arm or leg. You have fallen or do so frequently. Summary Peripheral neuropathy is when the nerves in the arms or legs are damaged, resulting in numbness, weakness, or pain. There are many causes of peripheral neuropathy, including diabetes, pinched nerves, vitamin deficiencies, autoimmune disease, and hereditary conditions. Diagnosing and finding the cause of peripheral neuropathy can be difficult. Your health care provider will take your medical history, do a physical exam, and do tests, including blood tests and nerve function tests. Treatment involves treating the underlying cause of the neuropathy and taking medicines to help control pain. Physical therapy and assistive devices may also help. This information is not intended to replace advice given to you by your health care provider. Make sure you discuss any questions you have with your health care provider. Document Revised: 06/30/2021 Document Reviewed: 06/30/2021 Elsevier Patient Education  [place].

## 2023-01-13 ENCOUNTER — Other Ambulatory Visit (HOSPITAL_COMMUNITY): Payer: Self-pay

## 2023-01-13 ENCOUNTER — Other Ambulatory Visit: Payer: Self-pay

## 2023-02-10 ENCOUNTER — Ambulatory Visit (INDEPENDENT_AMBULATORY_CARE_PROVIDER_SITE_OTHER): Payer: Medicare HMO | Admitting: Family Medicine

## 2023-02-10 ENCOUNTER — Encounter: Payer: Self-pay | Admitting: Family Medicine

## 2023-02-10 VITALS — BP 110/62 | HR 80 | Temp 98.5°F | Ht 74.0 in | Wt 287.2 lb

## 2023-02-10 DIAGNOSIS — R300 Dysuria: Secondary | ICD-10-CM

## 2023-02-10 DIAGNOSIS — R6889 Other general symptoms and signs: Secondary | ICD-10-CM

## 2023-02-10 LAB — POCT UA - MICROSCOPIC ONLY

## 2023-02-10 LAB — POC URINALSYSI DIPSTICK (AUTOMATED)
Bilirubin, UA: NEGATIVE
Glucose, UA: NEGATIVE
Ketones, UA: NEGATIVE
Nitrite, UA: NEGATIVE
Protein, UA: NEGATIVE
Spec Grav, UA: 1.01 (ref 1.010–1.025)
Urobilinogen, UA: 0.2 E.U./dL
pH, UA: 7.5 (ref 5.0–8.0)

## 2023-02-10 LAB — POCT INFLUENZA A/B
Influenza A, POC: NEGATIVE
Influenza B, POC: NEGATIVE

## 2023-02-10 LAB — POC COVID19 BINAXNOW: SARS Coronavirus 2 Ag: NEGATIVE

## 2023-02-10 MED ORDER — AMOXICILLIN-POT CLAVULANATE 875-125 MG PO TABS: 1.0000 | ORAL_TABLET | Freq: Two times a day (BID) | ORAL | 0 refills | Status: DC

## 2023-02-10 NOTE — Progress Notes (Signed)
Patient ID: Jeffrey Price, male    DOB: [birth year], 67 y.o.   MRN: [id number]  This visit was conducted in person.  BP 110/62 (BP Location: Left Arm)   Pulse 80   Temp 98.5 F (36.9 C) (Temporal)   Ht 6\' 2"  (1.88 m)   Wt 287 lb 3.2 oz (130.3 kg)   SpO2 98%   BMI 36.87 kg/m    CC:  Chief Complaint  Patient presents with   Cough   Muscle Pain   Fatigue    Subjective:   HPI: Jeffrey Price is a 67 y.o. male patient of Ronna Polio, [place] with history of hypertension, atrial fibrillation, sleep apnea, COPD and diabetes presenting on 02/10/2023 for Cough, Muscle Pain, and Fatigue   Date of onset:  2 days Initial symptoms included fatigue,  weakness, body aches, decreased appetite Symptoms progressed to fever (101.7 F),  some mild shortness of breath  No ear pain,  mild  sore throat  No cough.  No dry mouth.   No wheeze.   Mild dysuria this morning.. none since.  No skin rash, no pustules.  No diarrhea.    Sick contacts:  none COVID testing:   none    He has tried to treat with  tylenol, flexeril, meloxicam   Has history of of COPD. Former smoker .       Relevant past medical, surgical, family and social history reviewed and updated as indicated. Interim medical history since our last visit reviewed. Allergies and medications reviewed and updated. Outpatient Medications Prior to Visit  Medication Sig Dispense Refill   acetaminophen (TYLENOL) 325 MG tablet Take 2 tablets (650 mg total) by mouth every 6 (six) hours as needed for mild pain (or Fever >/= 101).     apixaban (ELIQUIS) 5 MG TABS tablet Take 1 tablet (5 mg total) by mouth 2 (two) times daily. 60 tablet 5   atenolol (TENORMIN) 50 MG tablet Take 1 tablet by mouth each morning and 1/2 tablet each evening 90 tablet 2   atorvastatin (LIPITOR) 20 MG tablet Take 1 tablet (20 mg total) by mouth daily. 90 tablet 3   blood glucose meter kit and supplies KIT Dispense based on patient and insurance preference. Use  up to four times daily as directed. 1 each 0   cetirizine (ZYRTEC) 10 MG tablet Take 10 mg by mouth daily as needed for allergies.     clotrimazole (CLOTRIMAZOLE AF) 1 % cream Apply topically twice daily as needed (rash). 45 g 2   Cyanocobalamin (B-12 SL) Place 1 tablet under the tongue daily.     cyclobenzaprine (FLEXERIL) 10 MG tablet Take 1 tablet (10 mg total) by mouth 2 (two) times daily as needed for muscle spasms. 90 tablet 3   empagliflozin (JARDIANCE) 10 MG TABS tablet Take 1 tablet (10 mg total) by mouth daily before breakfast. 90 tablet 1   fenofibrate (TRICOR) 145 MG tablet Take 1 tablet (145 mg total) by mouth daily. 90 tablet 1   folic acid (FOLVITE) 1 MG tablet Take 1 tablet (1 mg total) by mouth daily.     furosemide (LASIX) 80 MG tablet Take 1 tablet by mouth 2 times daily as needed. 360 tablet 0   gabapentin (NEURONTIN) 800 MG tablet Take 1 tablet (800 mg total) by mouth 3 (three) times daily. 270 tablet 1   glipiZIDE (GLUCOTROL XL) 10 MG 24 hr tablet Take 1 tablet (10 mg total) by mouth daily with breakfast. 90  tablet 0   lidocaine-prilocaine (EMLA) cream Apply 1 Application topically as needed (apply to both feet  daily). 30 g 0   meloxicam (MOBIC) 15 MG tablet TAKE 1 TABLET BY MOUTH ONCE DAILY AS NEEDED FOR INFLAMMATION 90 tablet 1   methimazole (TAPAZOLE) 5 MG tablet Take 1 tablet (5 mg total) by mouth every morning. 90 tablet 1   pantoprazole (PROTONIX) 40 MG tablet TAKE 1 TABLET BY MOUTH DAILY * PT NEEDS APPT 90 tablet 1   sildenafil (VIAGRA) 100 MG tablet Take 1/2-1 tablet by mouth daily as needed for erectile dysfunction. 30 tablet 2   tazarotene (AVAGE) 0.1 % cream SMARTSIG:1 Application Topical Every Evening     tretinoin (RETIN-A) 0.05 % cream Apply 1 application. topically daily as needed (oil skin). 45 g 3   triamcinolone cream (KENALOG) 0.1 % Apply to affected area(s) twice daily for 14 days (Patient taking differently: 1 Application 2 (two) times daily as needed  (irritation).) 45 g 0   metFORMIN (GLUCOPHAGE-XR) 500 MG 24 hr tablet Take 1 tablet (500 mg total) by mouth daily with breakfast. 90 tablet 0   No facility-administered medications prior to visit.     Per HPI unless specifically indicated in ROS section below Review of Systems  Constitutional:  Positive for chills, fatigue and fever.  HENT:  Positive for sore throat. Negative for congestion, ear pain, postnasal drip, sinus pressure and sinus pain.   Eyes:  Negative for pain.  Respiratory:  Positive for shortness of breath. Negative for cough.   Cardiovascular:  Negative for chest pain, palpitations and leg swelling.  Gastrointestinal:  Negative for abdominal pain.  Genitourinary:  Negative for dysuria.  Musculoskeletal:  Negative for arthralgias.  Neurological:  Negative for syncope, light-headedness and headaches.  Psychiatric/Behavioral:  Negative for dysphoric mood.    Objective:  BP 110/62 (BP Location: Left Arm)   Pulse 80   Temp 98.5 F (36.9 C) (Temporal)   Ht 6\' 2"  (1.88 m)   Wt 287 lb 3.2 oz (130.3 kg)   SpO2 98%   BMI 36.87 kg/m   Wt Readings from Last 3 Encounters:  02/10/23 287 lb 3.2 oz (130.3 kg)  01/12/23 290 lb (131.5 kg)  12/17/22 285 lb (129.3 kg)      Physical Exam Constitutional:      Appearance: He is well-developed.  HENT:     Head: Normocephalic.     Right Ear: Hearing normal.     Left Ear: Hearing normal.     Nose: Nose normal.  Neck:     Thyroid: No thyroid mass or thyromegaly.     Vascular: No carotid bruit.     Trachea: Trachea normal.  Cardiovascular:     Rate and Rhythm: Normal rate and regular rhythm.     Pulses: Normal pulses.     Heart sounds: Heart sounds not distant. No murmur heard.    No friction rub. No gallop.     Comments: No peripheral edema Pulmonary:     Effort: Pulmonary effort is normal. No respiratory distress.     Breath sounds: Normal breath sounds.  Skin:    General: Skin is warm and dry.     Findings: No rash.   Psychiatric:        Speech: Speech normal.        Behavior: Behavior normal.        Thought Content: Thought content normal.       Results for orders placed or performed in visit on  02/10/23  POC COVID-19  Result Value Ref Range   SARS Coronavirus 2 Ag Negative Negative  POCT Influenza A/B  Result Value Ref Range   Influenza A, POC Negative Negative   Influenza B, POC Negative Negative  POCT Urinalysis Dipstick (Automated)  Result Value Ref Range   Color, UA yellow    Clarity, UA clear    Glucose, UA Negative Negative   Bilirubin, UA negative    Ketones, UA negative    Spec Grav, UA 1.010 1.010 - 1.025   Blood, UA 2+    pH, UA 7.5 5.0 - 8.0   Protein, UA Negative Negative   Urobilinogen, UA 0.2 0.2 or 1.0 E.U./dL   Nitrite, UA negative    Leukocytes, UA Large (3+) (A) Negative    Assessment and Plan  Flu-like symptoms -     POC COVID-19 BinaxNow -     POCT Influenza A/B  Dysuria -     POCT Urinalysis Dipstick (Automated)  Somewhat unclear etiology on current flulike symptoms and fever.  Negative COVID and flu testing although early in course on day 2 of illness.  Encouraged patient if symptoms progressed to include cough and congestion to retest COVID. Urinalysis did look positive for possible UTI and patient does have dysuria.  Will treat with Augmentin for possible urinary tract infection. He does have COPD but currently no cough, does report mild shortness of breath but there is no wheezing on exam. Sore throat could be from allergies or beginnings of viral URI.  Recommended rest and fluids. He does state that he is having some flare of low back pain and left-sided "swelling" but this is not noted on exam.  He feels this is due to past low back issues and sciatica.  We did discuss the possibility of using a prednisone taper in office today.  There is no sign of focal vertebral pain suggesting discitis as cause of fever.  Return and ER precautions provided  Eliezer Lofts, MD

## 2023-02-10 NOTE — Patient Instructions (Addendum)
Rest, fluids. Tylenol for body pain and fever. Consider retesting for COVID on day 4-5 of illness.. call if positive. We will treat you for presumed urinary tract infection, will send urine for culture to verify.

## 2023-02-12 LAB — URINE CULTURE
MICRO NUMBER:: 14783072
SPECIMEN QUALITY:: ADEQUATE

## 2023-02-16 ENCOUNTER — Other Ambulatory Visit: Payer: Self-pay

## 2023-02-16 DIAGNOSIS — E1142 Type 2 diabetes mellitus with diabetic polyneuropathy: Secondary | ICD-10-CM

## 2023-02-16 DIAGNOSIS — I10 Essential (primary) hypertension: Secondary | ICD-10-CM

## 2023-02-24 ENCOUNTER — Other Ambulatory Visit: Payer: Self-pay | Admitting: Adult Health

## 2023-02-24 DIAGNOSIS — E1142 Type 2 diabetes mellitus with diabetic polyneuropathy: Secondary | ICD-10-CM

## 2023-02-25 DIAGNOSIS — N5201 Erectile dysfunction due to arterial insufficiency: Secondary | ICD-10-CM | POA: Diagnosis not present

## 2023-02-25 DIAGNOSIS — E349 Endocrine disorder, unspecified: Secondary | ICD-10-CM | POA: Diagnosis not present

## 2023-03-03 ENCOUNTER — Ambulatory Visit: Payer: Medicare HMO | Admitting: Neurology

## 2023-03-03 ENCOUNTER — Encounter: Payer: Self-pay | Admitting: Neurology

## 2023-03-03 VITALS — Ht 74.0 in | Wt 290.0 lb

## 2023-03-03 DIAGNOSIS — E1142 Type 2 diabetes mellitus with diabetic polyneuropathy: Secondary | ICD-10-CM

## 2023-03-03 DIAGNOSIS — G5603 Carpal tunnel syndrome, bilateral upper limbs: Secondary | ICD-10-CM

## 2023-03-03 NOTE — Patient Instructions (Signed)
[facility]  Carpal Tunnel Syndrome    EXAMPLES! Find what fits and feels best for you    Carpal tunnel syndrome is a condition that causes pain, numbness, and weakness in your hand and fingers. The carpal tunnel is a narrow area located on the palm side of your wrist. Repeated wrist motion or certain diseases may cause swelling within the tunnel. This swelling pinches the main nerve in the wrist. The main nerve in the wrist is called the median nerve. What are the causes? This condition may be caused by: Repeated and forceful wrist and hand motions. Wrist injuries. Arthritis. A cyst or tumor in the carpal tunnel. Fluid buildup during pregnancy. Use of tools that vibrate. Sometimes the cause of this condition is not known. What increases the risk? The following factors may make you more likely to develop this condition: Having a job that requires you to repeatedly or forcefully move your wrist or hand or requires you to use tools that vibrate. This may include jobs that involve using computers, working on an [employer], or working with power tools such as Radiographer, therapeutic. Being a woman. Having certain conditions, such as: Diabetes. Obesity. An underactive thyroid (hypothyroidism). Kidney failure. Rheumatoid arthritis. What are the signs or symptoms? Symptoms of this condition include: A tingling feeling in your fingers, especially in your thumb, index, and middle fingers. Tingling or numbness in your hand. An aching feeling in your entire arm, especially when your wrist and elbow are bent for a long time. Wrist pain that goes up your arm to your shoulder. Pain that goes down into your palm or fingers. A weak feeling in your hands. You may have trouble grabbing and holding items. Your symptoms may feel worse during the night. How is this diagnosed? This condition is diagnosed with a medical history and physical exam. You may also have tests,  including: Electromyogram (EMG). This test measures electrical signals sent by your nerves into the muscles. Nerve conduction study. This test measures how well electrical signals pass through your nerves. Imaging tests, such as X-rays, ultrasound, and MRI. These tests check for possible causes of your condition. How is this treated? This condition may be treated with: Lifestyle changes. It is important to stop or change the activity that caused your condition. Doing exercise and activities to strengthen and stretch your muscles and tendons (physical therapy). Making lifestyle changes to help with your condition and learning how to do your daily activities safely (occupational therapy). Medicines for pain and inflammation. This may include medicine that is injected into your wrist. A wrist splint or brace. Surgery. Follow these instructions at home: If you have a splint or brace: Wear the splint or brace as told by your health care provider. Remove it only as told by your health care provider. Loosen the splint or brace if your fingers tingle, become numb, or turn cold and blue. Keep the splint or brace clean. If the splint or brace is not waterproof: Do not let it get wet. Cover it with a watertight covering when you take a bath or shower. Managing pain, stiffness, and swelling If directed, put ice on the painful area. To do this: If you have a removeable splint or brace, remove it as told by your health care provider. Put ice in a plastic bag. Place a towel between your skin and the bag or between the splint or brace and the bag. Leave the ice on for 20 minutes, 2-3 times a  day. Do not fall asleep with the cold pack on your skin. Remove the ice if your skin turns bright red. This is very important. If you cannot feel pain, heat, or cold, you have a greater risk of damage to the area. Move your fingers often to reduce stiffness and swelling. General instructions Take over-the-counter  and prescription medicines only as told by your health care provider. Rest your wrist and hand from any activity that may be causing your pain. If your condition is work related, talk with your employer about changes that can be made, such as getting a wrist pad to use while typing. Do any exercises as told by your health care provider, physical therapist, or occupational therapist. Keep all follow-up visits. This is important. Contact a health care provider if: You have new symptoms. Your pain is not controlled with medicines. Your symptoms get worse. Get help right away if: You have severe numbness or tingling in your wrist or hand. Summary Carpal tunnel syndrome is a condition that causes pain, numbness, and weakness in your hand and fingers. It is usually caused by repeated wrist motions. Lifestyle changes and medicines are used to treat carpal tunnel syndrome. Surgery may be recommended. Follow your health care provider's instructions about wearing a splint, resting from activity, keeping follow-up visits, and calling for help. This information is not intended to replace advice given to you by your health care provider. Make sure you discuss any questions you have with your health care provider.

## 2023-03-06 NOTE — Progress Notes (Unsigned)
      Full Name: Jeffrey Price Gender: Male MRN #: [id number] Date of Birth: 11/30/1955    Visit Date: 03/03/2023 13:46 Age: 67 Years Examining Physician: Dr. Lleyton Byers Referring Physician: Dr. Carmen Dohmeier Height: 6 feet 2 inch  History: Pain, tingling numbness in both feet and hands, feet are too painful to walk barefoot. Long-standing hx of diabetic disease.   Summary:   EMG/Nerve Conduction Studies were performed on the bilateral uppers and left lower extremities:  The right median APB motor nerve showed prolonged distal onset latency (5.7 ms, N<4.4) and reduced amplitude(2.3 mV, N>4) The left  median APB motor nerve showed prolonged distal onset latency (5.4 ms, N<4.4) and reduced amplitude(2.6 mV, N>4) The left ulnar motor nerve was within normal limits The right Median 2nd Digit orthodromic sensory nerve showed no response The left radial sensory nerve was within normal limits The left  Median 2nd Digit orthodromic sensory nerve showed prolonged distal peak latency (4.6 ms, N<3.4) and reduced amplitude(2 uV, N>10) The left ulnar orthodromic sensory nerve was within normal limits. The right ulnar orthodromic sensory nerve was within normal limits. The right median/ulnar (palm) comparison nerve showed prolonged distal peak latency (Median Palm, 3.3 ms, N<2.2) and abnormal peak latency difference (Median Palm-Ulnar Palm, 1.1 ms, N<0.4) with a relative median delay.   The left median/ulnar (palm) comparison nerve showed prolonged distal peak latency (Median Palm, 3.6 ms, N<2.2) and abnormal peak latency difference (Median Palm-Ulnar Palm, 1.3 ms, N<0.4) with a relative median delay.   The left peroneal EDB motor nerve showed no response. The left tibial EDB motor nerve showed no response. The left sural sensory nerve showed no response. The left superficial peroneal sensory nerve showed no response. All remaining nerves (as indicated in the following tables) were within  normal limits.   The right extensor houses longus muscle showed polyphasic motor units and diminished motor unit recruitment.  The right abductor hallucis muscle showed increased spontaneous activity.All remaining muscles (as indicated in the following tables) were within normal limits.       Conclusion:  1.  There is bilateral upper extremity moderately-severe bilateral carpal tunnel syndrome. 2.  There is also concomitant severe distal  length-dependent peripheral polyneuropathy with globally absent motor and sensory conductions in the bilateral lower extremities.  [name], MD  [facility] [address] [place], [place] [address]  Phone [phone] Fax [phone]      Conclusion:     ------------------------------- Physician Name, M.D.  [facility] [address], [address] [place], [place] [address] Tel: [phone] Fax: [phone]  Verbal informed consent was obtained from the patient, patient was informed of potential risk of procedure, including bruising, bleeding, hematoma formation, infection, muscle weakness, muscle pain, numbness, among others.        MNC    Nerve / Sites Muscle Latency Ref. Amplitude Ref. Rel Amp Segments Distance Velocity Ref. Area    ms ms mV mV %  cm m/s m/s mVms  L Median - APB     Wrist APB 5.4 ?4.4 2.6 ?4.0 100 Wrist - APB 7   12.2     Upper arm APB 11.1  3.3  129 Upper arm - Wrist 28 49 ?49 16.5     Palm APB 2.4  1.1  31.5 Palm - APB    2.1  R Median - APB     Wrist APB 5.7 ?4.4 2.3 ?4.0 100 Wrist - APB 7   4.3       Upper arm APB 11.1  3.5  154 Upper arm - Wrist 27 49 ?49 21.5  L Ulnar - ADM     Wrist ADM 3.1 ?3.3 9.1 ?6.0 100 Wrist - ADM 7   29.3     B.Elbow ADM 5.1  8.1  89.2 B.Elbow - Wrist 12 61 ?49 26.9     A.Elbow ADM 8.3  7.2  89.1 A.Elbow - B.Elbow 18 56 ?49 26.9     Median above elbow ADM 9.0  3.7  50.8 Median above elbow - ADM    14.3  L Peroneal - EDB     Ankle  EDB NR ?6.5 NR ?2.0 NR Ankle - EDB 9   NR         Pop fossa - Ankle      L Tibial - AH     Ankle AH NR ?5.8 NR ?4.0 NR Ankle - AH 9   NR     Pop fossa AH NR  NR   Pop fossa - Ankle 49 NR ?41 0.2               SNC    Nerve / Sites Rec. Site Peak Lat Ref.  Amp Ref. Segments Distance Peak Diff Ref.    ms ms V V  cm ms ms  L Radial - Anatomical snuff box (Forearm)     Forearm Wrist 2.5 ?2.9 22 ?15 Forearm - Wrist 10    L Sural - Ankle (Calf)     Calf Ankle NR ?4.4 NR ?6 Calf - Ankle 14    L Superficial peroneal - Ankle     Lat leg Ankle NR ?4.4 NR ?6 Lat leg - Ankle 14    L Median, Ulnar - Transcarpal comparison     Median Palm Wrist 3.6 ?2.2 7 ?35 Median Palm - Wrist 8       Ulnar Palm Wrist 2.4 ?2.2 12 ?12 Ulnar Palm - Wrist 8          Median Palm - Ulnar Palm  1.3 ?0.4  R Median, Ulnar - Transcarpal comparison     Median Palm Wrist 3.3 ?2.2 8 ?35 Median Palm - Wrist 8       Ulnar Palm Wrist 2.2 ?2.2 4 ?12 Ulnar Palm - Wrist 8          Median Palm - Ulnar Palm  1.1 ?0.4  L Median - Orthodromic (Dig II, Mid palm)     Dig II Wrist 4.6 ?3.4 2 ?10 Dig II - Wrist 13    R Median - Orthodromic (Dig II, Mid palm)     Dig II Wrist NR ?3.4 NR ?10 Dig II - Wrist 13    L Ulnar - Orthodromic, (Dig V, Mid palm)     Dig V Wrist 3.0 ?3.1 5 ?5 Dig V - Wrist 11    R Ulnar - Orthodromic, (Dig V, Mid palm)     Dig V Wrist 2.8 ?3.1 8 ?5 Dig V - Wrist 11                         F  Wave    Nerve F Lat Ref.   ms ms  L Tibial - AH 62.4 ?56.0  L Ulnar - ADM 31.3 ?32.0         EMG Summary Table    Spontaneous MUAP Recruitment  Muscle IA Fib PSW Fasc Other Amp Dur. Poly Pattern  L. Cervical   paraspinals (low) Normal None None None _______ Normal Normal Normal Normal  R. Cervical paraspinals (low) Normal None None None _______ Normal Normal Normal Normal  L. Deltoid Normal None None None _______ Normal Normal Normal Normal  R. Deltoid Normal None None None _______ Normal Normal Normal Normal  L.  Triceps brachii Normal None None None _______ Normal Normal Normal Normal  R. Triceps brachii Normal None None None _______ Normal Normal Normal Normal  L. Pronator teres Normal None None None _______ Normal Normal Normal Normal  R. Pronator teres Normal None None None _______ Normal Normal Normal Normal  L. First dorsal interosseous Normal None None None _______ Normal Normal Normal Normal  R. First dorsal interosseous Normal None None None _______ Normal Normal Normal Normal  L. Opponens pollicis Normal None None None _______ Normal Normal Normal Normal  R. Opponens pollicis Normal None None None _______ Normal Normal Normal Normal  R. Vastus medialis Normal None None None _______ Normal Normal Normal Normal  R. Tibialis anterior Normal None None None _______ Normal Normal Normal Normal  R. Gastrocnemius (Medial head) Normal None None None _______ Could not tolerate     R. Extensor hallucis longus Normal None None None _______ Normal Normal 2+ Reduced  R. Abductor hallucis Normal None 2+ None _______ Could not tolerate        

## 2023-03-07 NOTE — Procedures (Signed)
Full Name: Jeffrey Price Gender: Male MRN #: [id number] Date of Birth: [birth year]    Visit Date: 03/03/2023 13:46 Age: 67 Years Examining Physician: Dr. Naomie Dean Referring Physician: Dr. Porfirio Mylar Dohmeier Height: 6 feet 2 inch  History: Pain, tingling numbness in both feet and hands, feet are too painful to walk barefoot. Long-standing hx of diabetic disease.   Summary:   EMG/Nerve Conduction Studies were performed on the bilateral uppers and left lower extremities:  The right median APB motor nerve showed prolonged distal onset latency (5.7 ms, N<4.4) and reduced amplitude(2.3 mV, N>4) The left  median APB motor nerve showed prolonged distal onset latency (5.4 ms, N<4.4) and reduced amplitude(2.6 mV, N>4) The left ulnar motor nerve was within normal limits The right Median 2nd Digit orthodromic sensory nerve showed no response The left radial sensory nerve was within normal limits The left  Median 2nd Digit orthodromic sensory nerve showed prolonged distal peak latency (4.6 ms, N<3.4) and reduced amplitude(2 uV, N>10) The left ulnar orthodromic sensory nerve was within normal limits. The right ulnar orthodromic sensory nerve was within normal limits. The right median/ulnar (palm) comparison nerve showed prolonged distal peak latency (Median Palm, 3.3 ms, N<2.2) and abnormal peak latency difference (Median Palm-Ulnar Palm, 1.1 ms, N<0.4) with a relative median delay.   The left median/ulnar (palm) comparison nerve showed prolonged distal peak latency (Median Palm, 3.6 ms, N<2.2) and abnormal peak latency difference (Median Palm-Ulnar Palm, 1.3 ms, N<0.4) with a relative median delay.   The left peroneal EDB motor nerve showed no response. The left tibial EDB motor nerve showed no response. The left sural sensory nerve showed no response. The left superficial peroneal sensory nerve showed no response. All remaining nerves (as indicated in the following tables) were within  normal limits.   The right extensor houses longus muscle showed polyphasic motor units and diminished motor unit recruitment.  The right abductor hallucis muscle showed increased spontaneous activity.All remaining muscles (as indicated in the following tables) were within normal limits.       Conclusion:  1.  There is bilateral upper extremity moderately-severe bilateral carpal tunnel syndrome. 2.  There is also concomitant severe distal  length-dependent peripheral polyneuropathy with globally absent motor and sensory conductions in the bilateral lower extremities.  Naomie Dean, MD  [facility] [address] [place], [place] [address]  Phone [phone] Fax [phone]      Conclusion:     ------------------------------- Physician Name, M.D.  [facility] [address] [place], [place] [address] Tel: [phone] Fax: [phone]  Verbal informed consent was obtained from the patient, patient was informed of potential risk of procedure, including bruising, bleeding, hematoma formation, infection, muscle weakness, muscle pain, numbness, among others.        MNC    Nerve / Sites Muscle Latency Ref. Amplitude Ref. Rel Amp Segments Distance Velocity Ref. Area    ms ms mV mV %  cm m/s m/s mVms  L Median - APB     Wrist APB 5.4 ?4.4 2.6 ?4.0 100 Wrist - APB 7   12.2     Upper arm APB 11.1  3.3  129 Upper arm - Wrist 28 49 ?49 16.5     Palm APB 2.4  1.1  31.5 Palm - APB    2.1  R Median - APB     Wrist APB 5.7 ?4.4 2.3 ?4.0 100 Wrist - APB 7   4.3  Upper arm APB 11.1  3.5  154 Upper arm - Wrist 27 49 ?49 21.5  L Ulnar - ADM     Wrist ADM 3.1 ?3.3 9.1 ?6.0 100 Wrist - ADM 7   29.3     B.Elbow ADM 5.1  8.1  89.2 B.Elbow - Wrist 12 61 ?49 26.9     A.Elbow ADM 8.3  7.2  89.1 A.Elbow - B.Elbow 18 56 ?49 26.9     Median above elbow ADM 9.0  3.7  50.8 Median above elbow - ADM    14.3  L Peroneal - EDB     Ankle  EDB NR ?6.5 NR ?2.0 NR Ankle - EDB 9   NR         Pop fossa - Ankle      L Tibial - AH     Ankle AH NR ?5.8 NR ?4.0 NR Ankle - AH 9   NR     Pop fossa AH NR  NR   Pop fossa - Ankle 49 NR ?41 0.2               SNC    Nerve / Sites Rec. Site Peak Lat Ref.  Amp Ref. Segments Distance Peak Diff Ref.    ms ms V V  cm ms ms  L Radial - Anatomical snuff box (Forearm)     Forearm Wrist 2.5 ?2.9 22 ?15 Forearm - Wrist 10    L Sural - Ankle (Calf)     Calf Ankle NR ?4.4 NR ?6 Calf - Ankle 14    L Superficial peroneal - Ankle     Lat leg Ankle NR ?4.4 NR ?6 Lat leg - Ankle 14    L Median, Ulnar - Transcarpal comparison     Median Palm Wrist 3.6 ?2.2 7 ?35 Median Palm - Wrist 8       Ulnar Palm Wrist 2.4 ?2.2 12 ?12 Ulnar Palm - Wrist 8          Median Palm - Ulnar Palm  1.3 ?0.4  R Median, Ulnar - Transcarpal comparison     Median Palm Wrist 3.3 ?2.2 8 ?35 Median Palm - Wrist 8       Ulnar Palm Wrist 2.2 ?2.2 4 ?12 Ulnar Palm - Wrist 8          Median Palm - Ulnar Palm  1.1 ?0.4  L Median - Orthodromic (Dig II, Mid palm)     Dig II Wrist 4.6 ?3.4 2 ?10 Dig II - Wrist 13    R Median - Orthodromic (Dig II, Mid palm)     Dig II Wrist NR ?3.4 NR ?10 Dig II - Wrist 13    L Ulnar - Orthodromic, (Dig V, Mid palm)     Dig V Wrist 3.0 ?3.1 5 ?5 Dig V - Wrist 11    R Ulnar - Orthodromic, (Dig V, Mid palm)     Dig V Wrist 2.8 ?3.1 8 ?5 Dig V - Wrist 70                         F  Wave    Nerve F Lat Ref.   ms ms  L Tibial - AH 62.4 ?56.0  L Ulnar - ADM 31.3 ?32.0         EMG Summary Table    Spontaneous MUAP Recruitment  Muscle IA Fib PSW Fasc Other Amp Dur. Poly Pattern  L. Cervical  paraspinals (low) Normal None None None _______ Normal Normal Normal Normal  R. Cervical paraspinals (low) Normal None None None _______ Normal Normal Normal Normal  L. Deltoid Normal None None None _______ Normal Normal Normal Normal  R. Deltoid Normal None None None _______ Normal Normal Normal Normal  L.  Triceps brachii Normal None None None _______ Normal Normal Normal Normal  R. Triceps brachii Normal None None None _______ Normal Normal Normal Normal  L. Pronator teres Normal None None None _______ Normal Normal Normal Normal  R. Pronator teres Normal None None None _______ Normal Normal Normal Normal  L. First dorsal interosseous Normal None None None _______ Normal Normal Normal Normal  R. First dorsal interosseous Normal None None None _______ Normal Normal Normal Normal  L. Opponens pollicis Normal None None None _______ Normal Normal Normal Normal  R. Opponens pollicis Normal None None None _______ Normal Normal Normal Normal  R. Vastus medialis Normal None None None _______ Normal Normal Normal Normal  R. Tibialis anterior Normal None None None _______ Normal Normal Normal Normal  R. Gastrocnemius (Medial head) Normal None None None _______ Could not tolerate     R. Extensor hallucis longus Normal None None None _______ Normal Normal 2+ Reduced  R. Abductor hallucis Normal None 2+ None _______ Could not tolerate

## 2023-03-08 ENCOUNTER — Telehealth: Payer: Self-pay | Admitting: Neurology

## 2023-03-08 NOTE — Telephone Encounter (Signed)
Hand surgery referral faxed to [facility] (fax# [phone], phone# [phone]) 

## 2023-03-09 ENCOUNTER — Other Ambulatory Visit: Payer: Self-pay | Admitting: Adult Health

## 2023-03-09 DIAGNOSIS — E1142 Type 2 diabetes mellitus with diabetic polyneuropathy: Secondary | ICD-10-CM
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
## 2023-03-10 ENCOUNTER — Other Ambulatory Visit: Payer: Self-pay

## 2023-03-10 ENCOUNTER — Other Ambulatory Visit (HOSPITAL_COMMUNITY): Payer: Self-pay

## 2023-03-10 MED ORDER — METFORMIN HCL ER 500 MG PO TB24
500.00 mg | ORAL_TABLET | Freq: Every day | ORAL | 0 refills | Status: AC
Start: 2023-03-10 — End: ?
  Filled 2023-03-10 – 2023-05-17 (×3): qty 90, 90d supply, fill #0

## 2023-03-10 MED ORDER — GABAPENTIN 800 MG PO TABS: 800.0000 mg | ORAL_TABLET | Freq: Three times a day (TID) | ORAL | 1 refills | Status: DC

## 2023-03-10 MED ORDER — FUROSEMIDE 80 MG PO TABS
80.0000 mg | ORAL_TABLET | Freq: Two times a day (BID) | ORAL | 0 refills | Status: DC | PRN
  Filled 2023-03-10: qty 180, 90d supply, fill #0
  Filled 2023-05-17 – 2023-08-09 (×2): qty 180, 90d supply, fill #1

## 2023-03-10 MED ORDER — ATENOLOL 50 MG PO TABS: ORAL_TABLET | ORAL | 2 refills | Status: DC

## 2023-03-14 ENCOUNTER — Encounter: Payer: Self-pay | Admitting: Neurology

## 2023-03-25 ENCOUNTER — Other Ambulatory Visit: Payer: Self-pay | Admitting: Adult Health

## 2023-03-28 ENCOUNTER — Encounter: Payer: Self-pay | Admitting: Adult Health

## 2023-03-29 MED ORDER — FENOFIBRATE 145 MG PO TABS: 145.0000 mg | ORAL_TABLET | Freq: Every day | ORAL | 0 refills | Status: DC

## 2023-04-04 ENCOUNTER — Encounter: Payer: Self-pay | Admitting: Family Medicine

## 2023-04-05 ENCOUNTER — Ambulatory Visit (INDEPENDENT_AMBULATORY_CARE_PROVIDER_SITE_OTHER): Payer: Medicare HMO | Admitting: Family Medicine

## 2023-04-05 ENCOUNTER — Encounter: Payer: Self-pay | Admitting: Family Medicine

## 2023-04-05 VITALS — BP 110/64 | HR 85 | Temp 98.3°F | Ht 74.0 in | Wt 284.1 lb

## 2023-04-05 DIAGNOSIS — R300 Dysuria: Secondary | ICD-10-CM

## 2023-04-05 LAB — POC URINALSYSI DIPSTICK (AUTOMATED)
Bilirubin, UA: NEGATIVE
Glucose, UA: NEGATIVE
Ketones, UA: NEGATIVE
Nitrite, UA: POSITIVE
Protein, UA: NEGATIVE
Spec Grav, UA: 1.01 (ref 1.010–1.025)
Urobilinogen, UA: 0.2 E.U./dL
pH, UA: 7.5 (ref 5.0–8.0)

## 2023-04-05 MED ORDER — CIPROFLOXACIN HCL 500 MG PO TABS: 500.0000 mg | ORAL_TABLET | Freq: Two times a day (BID) | ORAL | 0 refills | Status: AC

## 2023-04-05 NOTE — Assessment & Plan Note (Signed)
Acute, consistent with urinary tract infection, given fever concerning for pyelonephritis versus prostatitis. Treated 6 weeks ago for E. coli with Augmentin given at the time it was an unclear presentation of urinary tract infection. Currently patient without pain over prostate but does have some testicular pain. Will treat with Cipro high-dose x 10 days to cover for acute prostatitis versus acute pyelonephritis.  I am courage to follow-up with PCP as it is atypical for male to have recurrent simple UTIs.  Also hematuria noted on UA, needs repeat UA to prove resolution.

## 2023-04-05 NOTE — Progress Notes (Signed)
Patient ID: Jeffrey Price, male    DOB: 1956/02/03, 67 y.o.   MRN: [id number]  This visit was conducted in person.  BP 110/64 (BP Location: Left Arm, Patient Position: Sitting, Cuff Size: Large)   Pulse 85   Temp 98.3 F (36.8 C) (Temporal)   Ht 6\' 2"  (1.88 m)   Wt 284 lb 2 oz (128.9 kg)   SpO2 95%   BMI 36.48 kg/m    CC:  Chief Complaint  Patient presents with   Burning with Urination   Back Pain    Subjective:   HPI: Jeffrey Price is a 67 y.o. male presenting on 04/05/2023 for Burning with Urination and Back Pain  Dysuria  This is a new problem. The current episode started in the past 7 days. The problem occurs every urination. The problem has been gradually worsening. The pain is at a severity of 7/10. The maximum temperature recorded prior to his arrival was 101 - 101.9 F. He is Not sexually active. There is No history of pyelonephritis. Associated symptoms include chills, frequency, hematuria and urgency. Pertinent negatives include no flank pain, nausea or vomiting. Associated symptoms comments: Odor with urine   Low back pain. He has tried increased fluids for the symptoms. The treatment provided no relief. There is no history of catheterization, kidney stones, a single kidney, urinary stasis or a urological procedure.     Had UTi 6 weeks ago.. Culture grew EColi, pansenstitve.  Symptoms resolved completely.      Relevant past medical, surgical, family and social history reviewed and updated as indicated. Interim medical history since our last visit reviewed. Allergies and medications reviewed and updated. Outpatient Medications Prior to Visit  Medication Sig Dispense Refill   acetaminophen (TYLENOL) 325 MG tablet Take 2 tablets (650 mg total) by mouth every 6 (six) hours as needed for mild pain (or Fever >/= 101).     apixaban (ELIQUIS) 5 MG TABS tablet Take 1 tablet (5 mg total) by mouth 2 (two) times daily. 60 tablet 5   atenolol (TENORMIN) 50 MG tablet Take 1  tablet by mouth each morning and 1/2 tablet each evening 90 tablet 2   atorvastatin (LIPITOR) 20 MG tablet Take 1 tablet (20 mg total) by mouth daily. 90 tablet 3   blood glucose meter kit and supplies KIT Dispense based on patient and insurance preference. Use up to four times daily as directed. 1 each 0   cetirizine (ZYRTEC) 10 MG tablet Take 10 mg by mouth daily as needed for allergies.     clotrimazole (CLOTRIMAZOLE AF) 1 % cream Apply topically twice daily as needed (rash). 45 g 2   Cyanocobalamin (B-12 SL) Place 1 tablet under the tongue daily.     cyclobenzaprine (FLEXERIL) 10 MG tablet Take 1 tablet (10 mg total) by mouth 2 (two) times daily as needed for muscle spasms. 90 tablet 3   empagliflozin (JARDIANCE) 10 MG TABS tablet Take 1 tablet (10 mg total) by mouth daily before breakfast. 90 tablet 1   fenofibrate (TRICOR) 145 MG tablet Take 1 tablet (145 mg total) by mouth daily. 30 tablet 0   folic acid (FOLVITE) 1 MG tablet Take 1 tablet (1 mg total) by mouth daily.     furosemide (LASIX) 80 MG tablet Take 1 tablet by mouth 2 times daily as needed. 360 tablet 0   gabapentin (NEURONTIN) 800 MG tablet Take 1 tablet (800 mg total) by mouth 3 (three) times daily. 270 tablet 1  glipiZIDE (GLUCOTROL XL) 10 MG 24 hr tablet Take 1 tablet (10 mg total) by mouth daily with breakfast. 90 tablet 0   lidocaine-prilocaine (EMLA) cream Apply 1 Application topically as needed (apply to both feet  daily). 30 g 0   meloxicam (MOBIC) 15 MG tablet TAKE 1 TABLET BY MOUTH ONCE DAILY AS NEEDED FOR INFLAMMATION 90 tablet 1   metFORMIN (GLUCOPHAGE-XR) 500 MG 24 hr tablet Take 1 tablet (500 mg) by mouth daily with breakfast. 90 tablet 0   methimazole (TAPAZOLE) 5 MG tablet Take 1 tablet (5 mg total) by mouth every morning. 90 tablet 1   pantoprazole (PROTONIX) 40 MG tablet TAKE 1 TABLET BY MOUTH DAILY * PT NEEDS APPT 90 tablet 1   sildenafil (VIAGRA) 100 MG tablet Take 1/2-1 tablet by mouth daily as needed for  erectile dysfunction. 30 tablet 2   tazarotene (AVAGE) 0.1 % cream SMARTSIG:1 Application Topical Every Evening     tretinoin (RETIN-A) 0.05 % cream Apply 1 application. topically daily as needed (oil skin). 45 g 3   triamcinolone cream (KENALOG) 0.1 % Apply to affected area(s) twice daily for 14 days (Patient taking differently: 1 Application 2 (two) times daily as needed (irritation).) 45 g 0   amoxicillin-clavulanate (AUGMENTIN) 875-125 MG tablet Take 1 tablet by mouth 2 (two) times daily. 14 tablet 0   No facility-administered medications prior to visit.     Per HPI unless specifically indicated in ROS section below Review of Systems  Constitutional:  Positive for chills. Negative for fatigue and fever.  HENT:  Negative for ear pain.   Eyes:  Negative for pain.  Respiratory:  Negative for cough and shortness of breath.   Cardiovascular:  Negative for chest pain, palpitations and leg swelling.  Gastrointestinal:  Negative for abdominal pain, nausea and vomiting.  Genitourinary:  Positive for dysuria, frequency, hematuria and urgency. Negative for flank pain.  Musculoskeletal:  Negative for arthralgias.  Neurological:  Negative for syncope, light-headedness and headaches.  Psychiatric/Behavioral:  Negative for dysphoric mood.    Objective:  BP 110/64 (BP Location: Left Arm, Patient Position: Sitting, Cuff Size: Large)   Pulse 85   Temp 98.3 F (36.8 C) (Temporal)   Ht 6\' 2"  (1.88 m)   Wt 284 lb 2 oz (128.9 kg)   SpO2 95%   BMI 36.48 kg/m   Wt Readings from Last 3 Encounters:  04/05/23 284 lb 2 oz (128.9 kg)  03/03/23 290 lb (131.5 kg)  02/10/23 287 lb 3.2 oz (130.3 kg)      Physical Exam Constitutional:      Appearance: He is well-developed.  HENT:     Head: Normocephalic.     Right Ear: Hearing normal.     Left Ear: Hearing normal.     Nose: Nose normal.  Neck:     Thyroid: No thyroid mass or thyromegaly.     Vascular: No carotid bruit.     Trachea: Trachea normal.   Cardiovascular:     Rate and Rhythm: Normal rate and regular rhythm.     Pulses: Normal pulses.     Heart sounds: Heart sounds not distant. No murmur heard.    No friction rub. No gallop.     Comments: No peripheral edema Pulmonary:     Effort: Pulmonary effort is normal. No respiratory distress.     Breath sounds: Normal breath sounds.  Skin:    General: Skin is warm and dry.     Findings: No rash.  Psychiatric:  Speech: Speech normal.        Behavior: Behavior normal.        Thought Content: Thought content normal.       Results for orders placed or performed in visit on 04/05/23  POCT Urinalysis Dipstick (Automated)  Result Value Ref Range   Color, UA Yellow    Clarity, UA Cloudy    Glucose, UA Negative Negative   Bilirubin, UA Negative    Ketones, UA Negative    Spec Grav, UA 1.010 1.010 - 1.025   Blood, UA Small (1+)    pH, UA 7.5 5.0 - 8.0   Protein, UA Negative Negative   Urobilinogen, UA 0.2 0.2 or 1.0 E.U./dL   Nitrite, UA Positive    Leukocytes, UA Large (3+) (A) Negative    Assessment and Plan  Burning with urination Assessment & Plan: Acute, consistent with urinary tract infection, given fever concerning for pyelonephritis versus prostatitis. Treated 6 weeks ago for E. coli with Augmentin given at the time it was an unclear presentation of urinary tract infection. Currently patient without pain over prostate but does have some testicular pain. Will treat with Cipro high-dose x 10 days to cover for acute prostatitis versus acute pyelonephritis.  I am courage to follow-up with PCP as it is atypical for male to have recurrent simple UTIs.  Also hematuria noted on UA, needs repeat UA to prove resolution.   Orders: -     POCT Urinalysis Dipstick (Automated) -     Urine Culture  Other orders -     Ciprofloxacin HCl; Take 1 tablet (500 mg total) by mouth 2 (two) times daily for 10 days.  Dispense: 20 tablet; Refill: 0    No follow-ups on file.    Kerby Nora, MD

## 2023-04-05 NOTE — Patient Instructions (Signed)
Follow up with PCP as planned given atypical t have UTI in male.  Will treat higher dose and extended course given fever and concern for kidney involvement.  Start antibiotic.  Start probiotic ( Align) to prevent GI upset.  Drink water .  We will culture  urine and call with results.  Go to ER if fever on antibiotics or flank pain.

## 2023-04-06 ENCOUNTER — Ambulatory Visit (INDEPENDENT_AMBULATORY_CARE_PROVIDER_SITE_OTHER): Payer: Medicare HMO | Admitting: Adult Health

## 2023-04-06 ENCOUNTER — Encounter: Payer: Self-pay | Admitting: Adult Health

## 2023-04-06 VITALS — BP 122/78 | HR 50 | Temp 98.4°F | Ht 74.0 in | Wt 279.4 lb

## 2023-04-06 DIAGNOSIS — E785 Hyperlipidemia, unspecified: Secondary | ICD-10-CM | POA: Diagnosis not present

## 2023-04-06 DIAGNOSIS — E1142 Type 2 diabetes mellitus with diabetic polyneuropathy: Secondary | ICD-10-CM

## 2023-04-06 DIAGNOSIS — I10 Essential (primary) hypertension: Secondary | ICD-10-CM | POA: Diagnosis not present

## 2023-04-06 DIAGNOSIS — Z7984 Long term (current) use of oral hypoglycemic drugs: Secondary | ICD-10-CM | POA: Diagnosis not present

## 2023-04-06 DIAGNOSIS — N390 Urinary tract infection, site not specified: Secondary | ICD-10-CM

## 2023-04-06 LAB — LIPID PANEL
Cholesterol: 117 mg/dL (ref 0–200)
HDL: 33 mg/dL — ABNORMAL LOW (ref >39.00)
LDL Cholesterol: 44 mg/dL (ref 0–99)
NonHDL: 83.98
Total CHOL/HDL Ratio: 4
Triglycerides: 198 mg/dL — ABNORMAL HIGH (ref 0.0–149.0)
VLDL: 39.6 mg/dL (ref 0.0–40.0)

## 2023-04-06 LAB — BASIC METABOLIC PANEL
BUN: 13 mg/dL (ref 6–23)
CO2: 29 mEq/L (ref 19–32)
Calcium: 9.3 mg/dL (ref 8.4–10.5)
Chloride: 95 mEq/L — ABNORMAL LOW (ref 96–112)
Creatinine, Ser: 0.99 mg/dL (ref 0.40–1.50)
GFR: 79.45 mL/min (ref >60.00)
Glucose, Bld: 153 mg/dL — ABNORMAL HIGH (ref 70–99)
Potassium: 3.7 mEq/L (ref 3.5–5.1)
Sodium: 135 mEq/L (ref 135–145)

## 2023-04-06 LAB — POCT GLYCOSYLATED HEMOGLOBIN (HGB A1C): Hemoglobin A1C: 7 % — AB (ref 4.0–5.6)

## 2023-04-06 LAB — PSA: PSA: 0.3 ng/mL (ref 0.10–4.00)

## 2023-04-06 LAB — URINE CULTURE

## 2023-04-06 MED ORDER — FENOFIBRATE 145 MG PO TABS: 145.0000 mg | ORAL_TABLET | Freq: Every day | ORAL | 1 refills | Status: DC

## 2023-04-06 MED ORDER — EMPAGLIFLOZIN 25 MG PO TABS
25.0000 mg | ORAL_TABLET | Freq: Every day | ORAL | 0 refills | Status: DC
Start: 2023-04-06 — End: 2023-04-12

## 2023-04-06 MED ORDER — EMPAGLIFLOZIN 25 MG PO TABS: 25.0000 mg | ORAL_TABLET | Freq: Every day | ORAL | 0 refills | Status: DC

## 2023-04-06 NOTE — Progress Notes (Signed)
Subjective:    Patient ID: Jeffrey Price, male    DOB: 12-02-1955, 67 y.o.   MRN: [id number]  HPI 67 year old male who  has a past medical history of Arthritis, Atrial fibrillation (HCC), Blood clot in vein (1995), Bulging lumbar disc, Cataract, Cellulitis (2012), Complication of anesthesia, DDD (degenerative disc disease), lumbar, Diabetes mellitus without complication (HCC), GERD (gastroesophageal reflux disease), Graves disease, adenomatous colonic polyps (12/15/2017), Hyperlipidemia, Hypertension, Neuromuscular disorder (HCC), OSA on CPAP, Thyrotoxicosis without thyroid storm, and Varicose veins of both lower extremities.  Diabetes mellitus type 2-managed with metformin 500 mg ER and Jardiance 10 mg   Reports he has some mild GI side effects with Metformin. His last A1c was 7.1 - 6 months ago.   Hypertension-managed with Atenolol 50 mg in the am and 25 mg in the PM ( he has been taking 50 mg BID).  He denies dizziness, lightheadedness, chest pain, or shortness of breath BP Readings from Last 3 Encounters:  04/06/23 122/78  04/05/23 110/64  02/10/23 110/62   UTI - was seen yesterday at another office and diagnosed with a UTI. This is his second UTI in the last month and a half . He was started on  10 days of Cipro 500 mg BID. No longer having back pain.   Hyperlipidemia - Tricor was increased to 145 mg daily about 6 months ago. He continues to take Lipitor 20 mg daily.   Review of Systems See HPI   Past Medical History:  Diagnosis Date   Arthritis    in back   Atrial fibrillation (HCC)    Blood clot in vein 1995   bil legs   Bulging lumbar disc    Cataract    mild   Cellulitis 2012   forehead   Complication of anesthesia    had trouble waking up 06/11/2020   DDD (degenerative disc disease), lumbar    Diabetes mellitus without complication (HCC)    GERD (gastroesophageal reflux disease)    Graves disease    Hx of adenomatous colonic polyps 12/15/2017   Hyperlipidemia     Hypertension    Neuromuscular disorder (HCC)    neuropathy in feet   OSA on CPAP    Thyrotoxicosis without thyroid storm    Varicose veins of both lower extremities     Social History   Socioeconomic History   Marital status: Significant Other    Spouse name: Not on file   Number of children: 1   Years of education: Not on file   Highest education level: Some college, no degree  Occupational History   Occupation: Retiring  Tobacco Use   Smoking status: Former    Types: E-cigarettes, Cigarettes    Quit date: 12/03/2011    Years since quitting: 11.3   Smokeless tobacco: Never   Tobacco comments:    Former smoker 10/12/22  Vaping Use   Vaping Use: Every day   Substances: Nicotine, Flavoring  Substance and Sexual Activity   Alcohol use: Yes    Comment: whiskey, Burbon, as few drinks per night and more on weekends 10/12/22   Drug use: No   Sexual activity: Not on file  Other Topics Concern   Not on file  Social History Narrative   Patient is widowed.    He has girlfriend.   Right-handed.   Caffeine use: 2-3 cups per day.      Social Determinants of Health   Financial Resource Strain: Low Risk  (12/08/2022)   Overall  Financial Resource Strain (CARDIA)    Difficulty of Paying Living Expenses: Not hard at all  Food Insecurity: No Food Insecurity (12/08/2022)   Hunger Vital Sign    Worried About Running Out of Food in the Last Year: Never true    Ran Out of Food in the Last Year: Never true  Transportation Needs: No Transportation Needs (12/08/2022)   PRAPARE - Administrator, Civil Service (Medical): No    Lack of Transportation (Non-Medical): No  Physical Activity: Unknown (12/08/2022)   Exercise Vital Sign    Days of Exercise per Week: 0 days    Minutes of Exercise per Session: Not on file  Stress: No Stress Concern Present (12/08/2022)   [employer] of Occupational Health - Occupational Stress Questionnaire    Feeling of Stress : Not at all  Social  Connections: Moderately Integrated (12/08/2022)   Social Connection and Isolation Panel [NHANES]    Frequency of Communication with Friends and Family: More than three times a week    Frequency of Social Gatherings with Friends and Family: More than three times a week    Attends Religious Services: More than 4 times per year    Active Member of [employer] or Organizations: Yes    Attends Banker Meetings: More than 4 times per year    Marital Status: Widowed  Intimate Partner Violence: Not At Risk (12/08/2022)   Humiliation, Afraid, Rape, and Kick questionnaire    Fear of Current or Ex-Partner: No    Emotionally Abused: No    Physically Abused: No    Sexually Abused: No    Past Surgical History:  Procedure Laterality Date   ANTERIOR CERVICAL DECOMP/DISCECTOMY FUSION N/A 06/18/2021   Procedure: ANTERIOR CERVICAL DECOMPRESSION FUSION CERVICAL4- CERVICAL 5 WITH INSTRUEMENTATION AND ALLOGRAFT;  Surgeon: Estill Bamberg, MD;  Location: [facility] OR;  Service: Orthopedics;  Laterality: N/A;   ATRIAL FIBRILLATION ABLATION N/A 09/14/2022   Procedure: ATRIAL FIBRILLATION ABLATION;  Surgeon: Regan Lemming, MD;  Location: [facility] INVASIVE CV LAB;  Service: Cardiovascular;  Laterality: N/A;   BACK SURGERY  06/11/2020   BUBBLE STUDY  09/10/2022   Procedure: BUBBLE STUDY;  Surgeon: Christell Constant, MD;  Location: [facility] ENDOSCOPY;  Service: Cardiovascular;;   CARDIOVERSION N/A 12/22/2021   Procedure: CARDIOVERSION;  Surgeon: Wendall Stade, MD;  Location: [facility] ENDOSCOPY;  Service: Cardiovascular;  Laterality: N/A;   CARDIOVERSION N/A 10/18/2022   Procedure: CARDIOVERSION;  Surgeon: Parke Poisson, MD;  Location: [facility] ENDOSCOPY;  Service: Cardiovascular;  Laterality: N/A;   ENDOVENOUS ABLATION SAPHENOUS VEIN W/ LASER Left 10/18/2019   endovenous laser ablation left greater saphenous vein and stab phlebectomy > 20 incisions left leg by Cari Caraway MD    ENDOVENOUS ABLATION SAPHENOUS VEIN W/ LASER  Right 11/08/2019   endovenous laser ablation right greater saphenous vein and stab phlebectomy 10-20 incisions right leg by Cari Caraway MD    LUMBAR EPIDURAL INJECTION     TEE WITHOUT CARDIOVERSION N/A 12/22/2021   Procedure: TRANSESOPHAGEAL ECHOCARDIOGRAM (TEE);  Surgeon: Wendall Stade, MD;  Location: [facility] ENDOSCOPY;  Service: Cardiovascular;  Laterality: N/A;   TEE WITHOUT CARDIOVERSION N/A 09/10/2022   Procedure: TRANSESOPHAGEAL ECHOCARDIOGRAM (TEE);  Surgeon: Christell Constant, MD;  Location: [facility] ENDOSCOPY;  Service: Cardiovascular;  Laterality: N/A;    Family History  Problem Relation Age of Onset   Thyroid disease Mother    Parkinson's disease Father    Colon cancer Neg Hx    Rectal cancer Neg Hx  Stomach cancer Neg Hx    Esophageal cancer Neg Hx     No Known Allergies  Current Outpatient Medications on File Prior to Visit  Medication Sig Dispense Refill   acetaminophen (TYLENOL) 325 MG tablet Take 2 tablets (650 mg total) by mouth every 6 (six) hours as needed for mild pain (or Fever >/= 101).     apixaban (ELIQUIS) 5 MG TABS tablet Take 1 tablet (5 mg total) by mouth 2 (two) times daily. 60 tablet 5   atenolol (TENORMIN) 50 MG tablet Take 1 tablet by mouth each morning and 1/2 tablet each evening 90 tablet 2   atorvastatin (LIPITOR) 20 MG tablet Take 1 tablet (20 mg total) by mouth daily. 90 tablet 3   blood glucose meter kit and supplies KIT Dispense based on patient and insurance preference. Use up to four times daily as directed. 1 each 0   cetirizine (ZYRTEC) 10 MG tablet Take 10 mg by mouth daily as needed for allergies.     ciprofloxacin (CIPRO) 500 MG tablet Take 1 tablet (500 mg total) by mouth 2 (two) times daily for 10 days. 20 tablet 0   clotrimazole (CLOTRIMAZOLE AF) 1 % cream Apply topically twice daily as needed (rash). 45 g 2   Cyanocobalamin (B-12 SL) Place 1 tablet under the tongue daily.     cyclobenzaprine (FLEXERIL) 10 MG tablet Take 1 tablet (10 mg  total) by mouth 2 (two) times daily as needed for muscle spasms. 90 tablet 3   empagliflozin (JARDIANCE) 10 MG TABS tablet Take 1 tablet (10 mg total) by mouth daily before breakfast. 90 tablet 1   folic acid (FOLVITE) 1 MG tablet Take 1 tablet (1 mg total) by mouth daily.     furosemide (LASIX) 80 MG tablet Take 1 tablet by mouth 2 times daily as needed. 360 tablet 0   gabapentin (NEURONTIN) 800 MG tablet Take 1 tablet (800 mg total) by mouth 3 (three) times daily. 270 tablet 1   lidocaine-prilocaine (EMLA) cream Apply 1 Application topically as needed (apply to both feet  daily). 30 g 0   meloxicam (MOBIC) 15 MG tablet TAKE 1 TABLET BY MOUTH ONCE DAILY AS NEEDED FOR INFLAMMATION 90 tablet 1   metFORMIN (GLUCOPHAGE-XR) 500 MG 24 hr tablet Take 1 tablet (500 mg) by mouth daily with breakfast. 90 tablet 0   methimazole (TAPAZOLE) 5 MG tablet Take 1 tablet (5 mg total) by mouth every morning. 90 tablet 1   pantoprazole (PROTONIX) 40 MG tablet TAKE 1 TABLET BY MOUTH DAILY * PT NEEDS APPT 90 tablet 1   sildenafil (VIAGRA) 100 MG tablet Take 1/2-1 tablet by mouth daily as needed for erectile dysfunction. 30 tablet 2   tazarotene (AVAGE) 0.1 % cream SMARTSIG:1 Application Topical Every Evening     tretinoin (RETIN-A) 0.05 % cream Apply 1 application. topically daily as needed (oil skin). 45 g 3   triamcinolone cream (KENALOG) 0.1 % Apply to affected area(s) twice daily for 14 days (Patient taking differently: 1 Application 2 (two) times daily as needed (irritation).) 45 g 0   No current facility-administered medications on file prior to visit.    BP 122/78 (BP Location: Left Arm, Patient Position: Sitting, Cuff Size: Large)   Pulse (!) 50   Temp 98.4 F (36.9 C) (Oral)   Ht 6\' 2"  (1.88 m)   Wt 279 lb 6.4 oz (126.7 kg)   SpO2 96%   BMI 35.87 kg/m       Objective:  Physical Exam Vitals and nursing note reviewed.  Constitutional:      Appearance: He is obese.  Cardiovascular:     Rate and  Rhythm: Normal rate and regular rhythm.     Pulses: Normal pulses.     Heart sounds: Normal heart sounds.  Pulmonary:     Effort: Pulmonary effort is normal.     Breath sounds: Normal breath sounds.  Musculoskeletal:        General: Normal range of motion.  Skin:    General: Skin is warm and dry.  Neurological:     General: No focal deficit present.     Mental Status: He is oriented to person, place, and time.  Psychiatric:        Mood and Affect: Mood normal.        Behavior: Behavior normal.        Thought Content: Thought content normal.        Judgment: Judgment normal.       Assessment & Plan:   1. Controlled type 2 diabetes mellitus with diabetic polyneuropathy, without long-term current use of insulin (HCC)  - POC HgB A1c- 7.0. Will increase Jardiance to 25 mg daily  Samples of Jardiance 25 mg were given to the patient, quantity 4,  - empagliflozin (JARDIANCE) 25 MG TABS tablet; Take 1 tablet (25 mg total) by mouth daily before breakfast.  Dispense: 90 tablet; Refill: 0  2. Essential hypertension - Controlled, no change in medication  - Basic Metabolic Panel; Future  3. Hyperlipidemia, unspecified hyperlipidemia type  - Lipid panel; Future - fenofibrate (TRICOR) 145 MG tablet; Take 1 tablet (145 mg total) by mouth daily.  Dispense: 90 tablet; Refill: 1  4. Recurrent UTI - Possibly due to Jardiance? Will have him follow up with Urology though to make sure nothing else is causing his UTI's  -Encouraged to stay hydrated  - Basic Metabolic Panel; Future - PSA; Future  Shirline Frees, NP

## 2023-04-06 NOTE — Patient Instructions (Signed)
It was great seeing you today   I am going to increase your Jardiance to 25 mg daily.   I am also going to check some blood work on you today   Please follow up with Urology about the frequent UTI's

## 2023-04-07 ENCOUNTER — Other Ambulatory Visit: Payer: Self-pay

## 2023-04-07 ENCOUNTER — Other Ambulatory Visit (HOSPITAL_COMMUNITY): Payer: Self-pay

## 2023-04-07 ENCOUNTER — Encounter: Payer: Self-pay | Admitting: Family Medicine

## 2023-04-07 ENCOUNTER — Encounter: Payer: Self-pay | Admitting: Adult Health

## 2023-04-07 DIAGNOSIS — N50812 Left testicular pain: Secondary | ICD-10-CM | POA: Diagnosis not present

## 2023-04-07 DIAGNOSIS — N3000 Acute cystitis without hematuria: Secondary | ICD-10-CM | POA: Diagnosis not present

## 2023-04-07 DIAGNOSIS — N50811 Right testicular pain: Secondary | ICD-10-CM | POA: Diagnosis not present

## 2023-04-08 ENCOUNTER — Telehealth: Payer: Self-pay

## 2023-04-08 ENCOUNTER — Other Ambulatory Visit: Payer: Self-pay

## 2023-04-08 LAB — URINE CULTURE
MICRO NUMBER:: 15008542
SPECIMEN QUALITY:: ADEQUATE

## 2023-04-08 NOTE — Telephone Encounter (Signed)
Noted.. agree pt needs to be seen by Urology.

## 2023-04-08 NOTE — Telephone Encounter (Signed)
Pt message sent to  PCP for advise ?

## 2023-04-11 ENCOUNTER — Other Ambulatory Visit (HOSPITAL_COMMUNITY): Payer: Self-pay

## 2023-04-11 ENCOUNTER — Other Ambulatory Visit: Payer: Self-pay

## 2023-04-11 ENCOUNTER — Other Ambulatory Visit: Payer: Self-pay | Admitting: Adult Health

## 2023-04-11 ENCOUNTER — Encounter: Payer: Self-pay | Admitting: Adult Health

## 2023-04-11 ENCOUNTER — Telehealth: Payer: Self-pay

## 2023-04-11 NOTE — Telephone Encounter (Signed)
Pt message sent to [facility]

## 2023-04-12 ENCOUNTER — Other Ambulatory Visit (HOSPITAL_COMMUNITY): Payer: Self-pay

## 2023-04-12 ENCOUNTER — Other Ambulatory Visit: Payer: Self-pay

## 2023-04-12 ENCOUNTER — Other Ambulatory Visit: Payer: Self-pay | Admitting: Adult Health

## 2023-04-12 MED ORDER — GLIPIZIDE ER 10 MG PO TB24: 10.0000 mg | ORAL_TABLET | Freq: Every day | ORAL | 0 refills | Status: DC

## 2023-04-12 MED ORDER — ATENOLOL 50 MG PO TABS
ORAL_TABLET | ORAL | 2 refills | Status: DC
  Filled 2023-04-12: qty 90, 60d supply, fill #0
  Filled 2023-05-17 – 2023-10-17 (×4): qty 90, 60d supply, fill #1

## 2023-04-12 NOTE — Telephone Encounter (Signed)
Spoke with pt verbalized understanding of Kandee Keen advise

## 2023-04-14 DIAGNOSIS — N50812 Left testicular pain: Secondary | ICD-10-CM | POA: Diagnosis not present

## 2023-04-14 DIAGNOSIS — E349 Endocrine disorder, unspecified: Secondary | ICD-10-CM | POA: Diagnosis not present

## 2023-04-14 DIAGNOSIS — N452 Orchitis: Secondary | ICD-10-CM | POA: Diagnosis not present

## 2023-04-14 DIAGNOSIS — N3000 Acute cystitis without hematuria: Secondary | ICD-10-CM | POA: Diagnosis not present

## 2023-04-14 DIAGNOSIS — R948 Abnormal results of function studies of other organs and systems: Secondary | ICD-10-CM | POA: Diagnosis not present

## 2023-04-29 DIAGNOSIS — N5201 Erectile dysfunction due to arterial insufficiency: Secondary | ICD-10-CM | POA: Diagnosis not present

## 2023-04-29 DIAGNOSIS — N453 Epididymo-orchitis: Secondary | ICD-10-CM | POA: Diagnosis not present

## 2023-05-10 ENCOUNTER — Encounter: Payer: Self-pay | Admitting: Adult Health

## 2023-05-10 DIAGNOSIS — R300 Dysuria: Secondary | ICD-10-CM | POA: Diagnosis not present

## 2023-05-10 DIAGNOSIS — N453 Epididymo-orchitis: Secondary | ICD-10-CM | POA: Diagnosis not present

## 2023-05-10 DIAGNOSIS — R310 Gross hematuria: Secondary | ICD-10-CM | POA: Diagnosis not present

## 2023-05-17 ENCOUNTER — Other Ambulatory Visit: Payer: Self-pay

## 2023-05-17 ENCOUNTER — Other Ambulatory Visit (HOSPITAL_COMMUNITY): Payer: Self-pay

## 2023-05-17 DIAGNOSIS — R310 Gross hematuria: Secondary | ICD-10-CM | POA: Diagnosis not present

## 2023-05-20 NOTE — Telephone Encounter (Signed)
Please advise 

## 2023-05-24 ENCOUNTER — Ambulatory Visit (INDEPENDENT_AMBULATORY_CARE_PROVIDER_SITE_OTHER): Payer: Medicare HMO | Admitting: Adult Health

## 2023-05-24 ENCOUNTER — Encounter: Payer: Self-pay | Admitting: Adult Health

## 2023-05-24 VITALS — BP 100/60 | HR 75 | Temp 97.5°F | Ht 74.0 in | Wt 285.0 lb

## 2023-05-24 DIAGNOSIS — R300 Dysuria: Secondary | ICD-10-CM | POA: Diagnosis not present

## 2023-05-24 DIAGNOSIS — T50905A Adverse effect of unspecified drugs, medicaments and biological substances, initial encounter: Secondary | ICD-10-CM | POA: Diagnosis not present

## 2023-05-24 NOTE — Progress Notes (Signed)
Subjective:    Patient ID: Jeffrey Price, male    DOB: [birth year], 67 y.o.   MRN: [id number]  HPI 67 year old male who  has a past medical history of Arthritis, Atrial fibrillation (HCC), Blood clot in vein (1995), Bulging lumbar disc, Cataract, Cellulitis (2012), Complication of anesthesia, DDD (degenerative disc disease), lumbar, Diabetes mellitus without complication (HCC), GERD (gastroesophageal reflux disease), Graves disease, adenomatous colonic polyps (12/15/2017), Hyperlipidemia, Hypertension, Neuromuscular disorder (HCC), OSA on CPAP, Thyrotoxicosis without thyroid storm, and Varicose veins of both lower extremities.  He presents to the office today for need for work note. He was recently seen by Urology and has upcoming CT scan to find out why he continues to have dysuria. Per patient, urology is wondering if dysuria is kidney stone vs epididymitis vs cancer. He was originally given an out of work note by Urology that expired yesterday. He is wanting another note that will keep him out until after his CT scan. He is ok with working inside as some of his medications he is taking can have a sun sensitivity with adverse reactions      Review of Systems See HPI   Past Medical History:  Diagnosis Date   Arthritis    in back   Atrial fibrillation (HCC)    Blood clot in vein 1995   bil legs   Bulging lumbar disc    Cataract    mild   Cellulitis 2012   forehead   Complication of anesthesia    had trouble waking up 06/11/2020   DDD (degenerative disc disease), lumbar    Diabetes mellitus without complication (HCC)    GERD (gastroesophageal reflux disease)    Graves disease    Hx of adenomatous colonic polyps 12/15/2017   Hyperlipidemia    Hypertension    Neuromuscular disorder (HCC)    neuropathy in feet   OSA on CPAP    Thyrotoxicosis without thyroid storm    Varicose veins of both lower extremities     Social History   Socioeconomic History   Marital status:  Significant Other    Spouse name: Not on file   Number of children: 1   Years of education: Not on file   Highest education level: Some college, no degree  Occupational History   Occupation: Retiring  Tobacco Use   Smoking status: Former    Current packs/day: 0.00    Types: E-cigarettes, Cigarettes    Quit date: 12/03/2011    Years since quitting: 11.4   Smokeless tobacco: Never   Tobacco comments:    Former smoker 10/12/22  Vaping Use   Vaping status: Every Day   Substances: Nicotine, Flavoring  Substance and Sexual Activity   Alcohol use: Yes    Comment: whiskey, Burbon, as few drinks per night and more on weekends 10/12/22   Drug use: No   Sexual activity: Not on file  Other Topics Concern   Not on file  Social History Narrative   Patient is widowed.    He has girlfriend.   Right-handed.   Caffeine use: 2-3 cups per day.      Social Determinants of Health   Financial Resource Strain: Low Risk  (12/08/2022)   Overall Financial Resource Strain (CARDIA)    Difficulty of Paying Living Expenses: Not hard at all  Food Insecurity: No Food Insecurity (12/08/2022)   Hunger Vital Sign    Worried About Running Out of Food in the Last Year: Never true    Ran  Out of Food in the Last Year: Never true  Transportation Needs: No Transportation Needs (12/08/2022)   PRAPARE - Administrator, Civil Service (Medical): No    Lack of Transportation (Non-Medical): No  Physical Activity: Unknown (12/08/2022)   Exercise Vital Sign    Days of Exercise per Week: 0 days    Minutes of Exercise per Session: Not on file  Stress: No Stress Concern Present (12/08/2022)   [employer] of Occupational Health - Occupational Stress Questionnaire    Feeling of Stress : Not at all  Social Connections: Moderately Integrated (12/08/2022)   Social Connection and Isolation Panel [NHANES]    Frequency of Communication with Friends and Family: More than three times a week    Frequency of Social  Gatherings with Friends and Family: More than three times a week    Attends Religious Services: More than 4 times per year    Active Member of [employer] or Organizations: Yes    Attends Banker Meetings: More than 4 times per year    Marital Status: Widowed  Intimate Partner Violence: Not At Risk (12/08/2022)   Humiliation, Afraid, Rape, and Kick questionnaire    Fear of Current or Ex-Partner: No    Emotionally Abused: No    Physically Abused: No    Sexually Abused: No    Past Surgical History:  Procedure Laterality Date   ANTERIOR CERVICAL DECOMP/DISCECTOMY FUSION N/A 06/18/2021   Procedure: ANTERIOR CERVICAL DECOMPRESSION FUSION CERVICAL4- CERVICAL 5 WITH INSTRUEMENTATION AND ALLOGRAFT;  Surgeon: Estill Bamberg, MD;  Location: [facility] OR;  Service: Orthopedics;  Laterality: N/A;   ATRIAL FIBRILLATION ABLATION N/A 09/14/2022   Procedure: ATRIAL FIBRILLATION ABLATION;  Surgeon: Regan Lemming, MD;  Location: [facility] INVASIVE CV LAB;  Service: Cardiovascular;  Laterality: N/A;   BACK SURGERY  06/11/2020   BUBBLE STUDY  09/10/2022   Procedure: BUBBLE STUDY;  Surgeon: Christell Constant, MD;  Location: [facility] ENDOSCOPY;  Service: Cardiovascular;;   CARDIOVERSION N/A 12/22/2021   Procedure: CARDIOVERSION;  Surgeon: Wendall Stade, MD;  Location: [facility] ENDOSCOPY;  Service: Cardiovascular;  Laterality: N/A;   CARDIOVERSION N/A 10/18/2022   Procedure: CARDIOVERSION;  Surgeon: Parke Poisson, MD;  Location: [facility] ENDOSCOPY;  Service: Cardiovascular;  Laterality: N/A;   ENDOVENOUS ABLATION SAPHENOUS VEIN W/ LASER Left 10/18/2019   endovenous laser ablation left greater saphenous vein and stab phlebectomy > 20 incisions left leg by Cari Caraway MD    ENDOVENOUS ABLATION SAPHENOUS VEIN W/ LASER Right 11/08/2019   endovenous laser ablation right greater saphenous vein and stab phlebectomy 10-20 incisions right leg by Cari Caraway MD    LUMBAR EPIDURAL INJECTION     TEE WITHOUT CARDIOVERSION N/A  12/22/2021   Procedure: TRANSESOPHAGEAL ECHOCARDIOGRAM (TEE);  Surgeon: Wendall Stade, MD;  Location: [facility] ENDOSCOPY;  Service: Cardiovascular;  Laterality: N/A;   TEE WITHOUT CARDIOVERSION N/A 09/10/2022   Procedure: TRANSESOPHAGEAL ECHOCARDIOGRAM (TEE);  Surgeon: Christell Constant, MD;  Location: [facility] ENDOSCOPY;  Service: Cardiovascular;  Laterality: N/A;    Family History  Problem Relation Age of Onset   Thyroid disease Mother    Parkinson's disease Father    Colon cancer Neg Hx    Rectal cancer Neg Hx    Stomach cancer Neg Hx    Esophageal cancer Neg Hx     No Known Allergies  Current Outpatient Medications on File Prior to Visit  Medication Sig Dispense Refill   acetaminophen (TYLENOL) 325 MG tablet Take 2 tablets (650 mg total)  by mouth every 6 (six) hours as needed for mild pain (or Fever >/= 101).     apixaban (ELIQUIS) 5 MG TABS tablet Take 1 tablet (5 mg total) by mouth 2 (two) times daily. 60 tablet 5   atenolol (TENORMIN) 50 MG tablet Take 1 tablet (50 mg total) by mouth every morning AND 0.5 tablets (25 mg total) every evening. 90 tablet 2   atorvastatin (LIPITOR) 20 MG tablet Take 1 tablet (20 mg total) by mouth daily. 90 tablet 3   blood glucose meter kit and supplies KIT Dispense based on patient and insurance preference. Use up to four times daily as directed. 1 each 0   cetirizine (ZYRTEC) 10 MG tablet Take 10 mg by mouth daily as needed for allergies.     clotrimazole (CLOTRIMAZOLE AF) 1 % cream Apply topically twice daily as needed (rash). 45 g 2   Cyanocobalamin (B-12 SL) Place 1 tablet under the tongue daily.     cyclobenzaprine (FLEXERIL) 10 MG tablet Take 1 tablet (10 mg total) by mouth 2 (two) times daily as needed for muscle spasms. 90 tablet 3   fenofibrate (TRICOR) 145 MG tablet Take 1 tablet (145 mg total) by mouth daily. 90 tablet 1   folic acid (FOLVITE) 1 MG tablet Take 1 tablet (1 mg total) by mouth daily.     furosemide (LASIX) 80 MG tablet Take 1  tablet by mouth 2 times daily as needed. 360 tablet 0   gabapentin (NEURONTIN) 800 MG tablet Take 1 tablet (800 mg total) by mouth 3 (three) times daily. 270 tablet 1   glipiZIDE (GLUCOTROL XL) 10 MG 24 hr tablet Take 1 tablet (10 mg total) by mouth daily with breakfast. 90 tablet 0   lidocaine-prilocaine (EMLA) cream Apply 1 Application topically as needed (apply to both feet  daily). 30 g 0   meloxicam (MOBIC) 15 MG tablet TAKE 1 TABLET BY MOUTH ONCE DAILY AS NEEDED FOR INFLAMMATION 90 tablet 1   metFORMIN (GLUCOPHAGE-XR) 500 MG 24 hr tablet Take 1 tablet (500 mg) by mouth daily with breakfast. 90 tablet 0   methimazole (TAPAZOLE) 5 MG tablet Take 1 tablet (5 mg total) by mouth every morning. 90 tablet 1   pantoprazole (PROTONIX) 40 MG tablet TAKE 1 TABLET BY MOUTH DAILY * PT NEEDS APPT 90 tablet 1   sildenafil (VIAGRA) 100 MG tablet Take 1/2-1 tablet by mouth daily as needed for erectile dysfunction. 30 tablet 2   tazarotene (AVAGE) 0.1 % cream SMARTSIG:1 Application Topical Every Evening     tretinoin (RETIN-A) 0.05 % cream Apply 1 application. topically daily as needed (oil skin). 45 g 3   triamcinolone cream (KENALOG) 0.1 % Apply to affected area(s) twice daily for 14 days (Patient taking differently: 1 Application 2 (two) times daily as needed (irritation).) 45 g 0   No current facility-administered medications on file prior to visit.    BP 100/60   Pulse 75   Temp (!) 97.5 F (36.4 C) (Oral)   Ht 6\' 2"  (1.88 m)   Wt 285 lb (129.3 kg)   SpO2 93%   BMI 36.59 kg/m       Objective:   Physical Exam Vitals and nursing note reviewed.  Cardiovascular:     Rate and Rhythm: Normal rate and regular rhythm.     Pulses: Normal pulses.     Heart sounds: Normal heart sounds.  Pulmonary:     Effort: Pulmonary effort is normal.     Breath sounds:  Normal breath sounds.  Musculoskeletal:        General: Normal range of motion.  Skin:    General: Skin is warm and dry.  Neurological:      General: No focal deficit present.     Mental Status: He is oriented to person, place, and time.  Psychiatric:        Mood and Affect: Mood normal.        Behavior: Behavior normal.        Thought Content: Thought content normal.        Judgment: Judgment normal.        Assessment & Plan:  1. Dysuria - work note written   2. Adverse effect of drug, initial encounter - Work note written   Shirline Frees, NP

## 2023-06-06 ENCOUNTER — Encounter: Payer: Self-pay | Admitting: Neurology

## 2023-06-06 ENCOUNTER — Ambulatory Visit: Payer: Medicare HMO | Admitting: Neurology

## 2023-06-06 VITALS — BP 112/66 | HR 68 | Ht 74.0 in | Wt 280.0 lb

## 2023-06-06 DIAGNOSIS — I4819 Other persistent atrial fibrillation: Secondary | ICD-10-CM | POA: Diagnosis not present

## 2023-06-06 DIAGNOSIS — G587 Mononeuritis multiplex: Secondary | ICD-10-CM

## 2023-06-06 DIAGNOSIS — M50022 Cervical disc disorder at C5-C6 level with myelopathy: Secondary | ICD-10-CM

## 2023-06-06 DIAGNOSIS — G4731 Primary central sleep apnea: Secondary | ICD-10-CM

## 2023-06-06 DIAGNOSIS — E1142 Type 2 diabetes mellitus with diabetic polyneuropathy: Secondary | ICD-10-CM | POA: Diagnosis not present

## 2023-06-06 NOTE — Progress Notes (Signed)
Provider:  Melvyn Novas, MD  Primary Care Physician:  Shirline Frees, NP [address] [place] [address]     Referring Provider: Evelene Croon, NP          Chief Complaint according to patient   Patient presents with:     New Patient (Initial Visit)     Here to follow up on neuropathy, probable double crush, had UTI and  has OSA.       HISTORY OF PRESENT ILLNESS:  Jeffrey Price is a 67 y.o. male patient who is here for revisit 06/06/2023 . Chief concern according to patient :  Recently having flank pain and burning urination, 14 days of ATB didn't  help. Wednesday is follow up .  Pain, tingling numbness in both feet and hands, feet are too painful to walk barefoot. Long-standing hx of diabetic disease.   Has OSA : Jeffrey Price is a 67 y.o.  Caucasian male patient seen here in a consultation on 05/31/2022 from PCP Miguel Rota, MD,  for follow up on  sleep apnea, atrial fibrillation:   This is a revisit for this gentleman whom I have seen in 2022 when he was referred for an evaluation of possible having sleep apnea following that he had atrial fibrillation shortness of breath and that his girlfriend had recorded his reported his snoring and she had witnessed apneas. The patient underwent cardioversion with dr. Elberta Fortis, which did not work, he is pending ablation procedure. I had ordered a SPLIT night study 10-23-2021, but insurance only allowed a HST-    Calculated pAHI (per hour):   Apnea-hypopnea index was severe: overall 74.3/h, during REM sleep 83/h, and during non-REM sleep 73.3/h.                                           Positional AHI: In supine sleep the AHI exacerbated to 81.1/h in prone sleep 40.9/h on the left side 75.6/h.  Apnea was severe in all sleep positions.   Dr Trevor Mace EMG and NCV Summary: she referred [facility]   EMG/Nerve Conduction Studies were performed on the bilateral uppers and left lower extremities:  The right  median APB motor nerve showed prolonged distal onset latency (5.7 ms, N<4.4) and reduced amplitude(2.3 mV, N>4) The left  median APB motor nerve showed prolonged distal onset latency (5.4 ms, N<4.4) and reduced amplitude(2.6 mV, N>4) The left ulnar motor nerve was within normal limits The right Median 2nd Digit orthodromic sensory nerve showed no response The left radial sensory nerve was within normal limits The left  Median 2nd Digit orthodromic sensory nerve showed prolonged distal peak latency (4.6 ms, N<3.4) and reduced amplitude(2 uV, N>10) The left ulnar orthodromic sensory nerve was within normal limits. The right ulnar orthodromic sensory nerve was within normal limits. The right median/ulnar (palm) comparison nerve showed prolonged distal peak latency (Median Palm, 3.3 ms, N<2.2) and abnormal peak latency difference (Median Palm-Ulnar Palm, 1.1 ms, N<0.4) with a relative median delay.   The left median/ulnar (palm) comparison nerve showed prolonged distal peak latency (Median Palm, 3.6 ms, N<2.2) and abnormal peak latency difference (Median Palm-Ulnar Palm, 1.3 ms, N<0.4) with a relative median delay.   The left peroneal EDB motor nerve showed no response. The left tibial EDB motor nerve showed no response. The left sural  sensory nerve showed no response. The left superficial peroneal sensory nerve showed no response. All remaining nerves (as indicated in the following tables) were within normal limits.   The right extensor houses longus muscle showed polyphasic motor units and diminished motor unit recruitment.  The right abductor hallucis muscle showed increased spontaneous activity.All remaining muscles (as indicated in the following tables) were within normal limits.         Conclusion:  1.  There is bilateral upper extremity moderately-severe bilateral carpal tunnel syndrome. 2.  There is also concomitant severe distal  length-dependent peripheral polyneuropathy with globally  absent motor and sensory conductions in the bilateral lower extremities.   Naomie Dean, MD    16-Dec 2022;reviewed the patient's operative chart he underwent anterior cervical decompression with fusion at cervical level C4-C5 with allograft.  His diagnosis was cervical myelopathy with disc herniation resulting in spinal cord compression.  Surgical date was 06-18-2021.  It was after his anesthesia was completed and the surgery completed that they were mentioning of apneic events being witnessed.I reviewed the patient's operative chart he underwent anterior cervical decompression with fusion at cervical level C4-C5 with allograft.  His diagnosis was cervical myelopathy with disc herniation resulting in spinal cord compression.  Surgical date was 06-18-2021.  It was after his anesthesia was completed and the surgery completed that they were mentioning of apneic events being witnessed. He was diagnosed with atrial fib after SOB was worsening.     Review of Systems: Out of a complete 14 system review, the patient complains of only the following symptoms, and all other reviewed systems are negative.:  Fatigue, sleepiness , snoring, fragmented sleep.  How likely are you to doze in the following situations: 0 = not likely, 1 = slight chance, 2 = moderate chance, 3 = high chance   Sitting and Reading? Watching Television? Sitting inactive in a public place (theater or meeting)? As a passenger in a car for an hour without a break? Lying down in the afternoon when circumstances permit? Sitting and talking to someone? Sitting quietly after lunch without alcohol? In a car, while stopped for a few minutes in traffic?   His Epworth Sleepiness Scale was endorsed at 4, his fatigue severity at 15 and his geriatric depression scale at 4 out of 15. Social History   Socioeconomic History   Marital status: Significant Other    Spouse name: Not on file   Number of children: 1   Years of education: Not on  file   Highest education level: Some college, no degree  Occupational History   Occupation: Retiring  Tobacco Use   Smoking status: Former    Current packs/day: 0.00    Types: E-cigarettes, Cigarettes    Quit date: 12/03/2011    Years since quitting: 11.5   Smokeless tobacco: Never   Tobacco comments:    Former smoker 10/12/22  Vaping Use   Vaping status: Every Day   Substances: Nicotine, Flavoring  Substance and Sexual Activity   Alcohol use: Yes    Comment: whiskey, Burbon, as few drinks per night and more on weekends 10/12/22   Drug use: No   Sexual activity: Not on file  Other Topics Concern   Not on file  Social History Narrative   Patient is widowed.    He has girlfriend.   Right-handed.   Caffeine use: 2-3 cups per day.      Social Determinants of Health   Financial Resource Strain: Low Risk  (12/08/2022)  Overall Financial Resource Strain (CARDIA)    Difficulty of Paying Living Expenses: Not hard at all  Food Insecurity: No Food Insecurity (12/08/2022)   Hunger Vital Sign    Worried About Running Out of Food in the Last Year: Never true    Ran Out of Food in the Last Year: Never true  Transportation Needs: No Transportation Needs (12/08/2022)   PRAPARE - Administrator, Civil Service (Medical): No    Lack of Transportation (Non-Medical): No  Physical Activity: Unknown (12/08/2022)   Exercise Vital Sign    Days of Exercise per Week: 0 days    Minutes of Exercise per Session: Not on file  Stress: No Stress Concern Present (12/08/2022)   [employer] of Occupational Health - Occupational Stress Questionnaire    Feeling of Stress : Not at all  Social Connections: Moderately Integrated (12/08/2022)   Social Connection and Isolation Panel [NHANES]    Frequency of Communication with Friends and Family: More than three times a week    Frequency of Social Gatherings with Friends and Family: More than three times a week    Attends Religious Services: More  than 4 times per year    Active Member of [employer] or Organizations: Yes    Attends Banker Meetings: More than 4 times per year    Marital Status: Widowed    Family History  Problem Relation Age of Onset   Thyroid disease Mother    Parkinson's disease Father    Colon cancer Neg Hx    Rectal cancer Neg Hx    Stomach cancer Neg Hx    Esophageal cancer Neg Hx     Past Medical History:  Diagnosis Date   Arthritis    in back   Atrial fibrillation (HCC)    Blood clot in vein 1995   bil legs   Bulging lumbar disc    Cataract    mild   Cellulitis 2012   forehead   Complication of anesthesia    had trouble waking up 06/11/2020   DDD (degenerative disc disease), lumbar    Diabetes mellitus without complication (HCC)    GERD (gastroesophageal reflux disease)    Graves disease    Hx of adenomatous colonic polyps 12/15/2017   Hyperlipidemia    Hypertension    Neuromuscular disorder (HCC)    neuropathy in feet   OSA on CPAP    Thyrotoxicosis without thyroid storm    Varicose veins of both lower extremities     Past Surgical History:  Procedure Laterality Date   ANTERIOR CERVICAL DECOMP/DISCECTOMY FUSION N/A 06/18/2021   Procedure: ANTERIOR CERVICAL DECOMPRESSION FUSION CERVICAL4- CERVICAL 5 WITH INSTRUEMENTATION AND ALLOGRAFT;  Surgeon: Estill Bamberg, MD;  Location: [facility] OR;  Service: Orthopedics;  Laterality: N/A;   ATRIAL FIBRILLATION ABLATION N/A 09/14/2022   Procedure: ATRIAL FIBRILLATION ABLATION;  Surgeon: Regan Lemming, MD;  Location: [facility] INVASIVE CV LAB;  Service: Cardiovascular;  Laterality: N/A;   BACK SURGERY  06/11/2020   BUBBLE STUDY  09/10/2022   Procedure: BUBBLE STUDY;  Surgeon: Christell Constant, MD;  Location: [facility] ENDOSCOPY;  Service: Cardiovascular;;   CARDIOVERSION N/A 12/22/2021   Procedure: CARDIOVERSION;  Surgeon: Wendall Stade, MD;  Location: [facility] ENDOSCOPY;  Service: Cardiovascular;  Laterality: N/A;   CARDIOVERSION N/A 10/18/2022    Procedure: CARDIOVERSION;  Surgeon: Parke Poisson, MD;  Location: [facility] ENDOSCOPY;  Service: Cardiovascular;  Laterality: N/A;   ENDOVENOUS ABLATION SAPHENOUS VEIN W/ LASER Left 10/18/2019  endovenous laser ablation left greater saphenous vein and stab phlebectomy > 20 incisions left leg by Cari Caraway MD    ENDOVENOUS ABLATION SAPHENOUS VEIN W/ LASER Right 11/08/2019   endovenous laser ablation right greater saphenous vein and stab phlebectomy 10-20 incisions right leg by Cari Caraway MD    LUMBAR EPIDURAL INJECTION     TEE WITHOUT CARDIOVERSION N/A 12/22/2021   Procedure: TRANSESOPHAGEAL ECHOCARDIOGRAM (TEE);  Surgeon: Wendall Stade, MD;  Location: [facility] ENDOSCOPY;  Service: Cardiovascular;  Laterality: N/A;   TEE WITHOUT CARDIOVERSION N/A 09/10/2022   Procedure: TRANSESOPHAGEAL ECHOCARDIOGRAM (TEE);  Surgeon: Christell Constant, MD;  Location: [facility] ENDOSCOPY;  Service: Cardiovascular;  Laterality: N/A;     Current Outpatient Medications on File Prior to Visit  Medication Sig Dispense Refill   acetaminophen (TYLENOL) 325 MG tablet Take 2 tablets (650 mg total) by mouth every 6 (six) hours as needed for mild pain (or Fever >/= 101).     apixaban (ELIQUIS) 5 MG TABS tablet Take 1 tablet (5 mg total) by mouth 2 (two) times daily. 60 tablet 5   atenolol (TENORMIN) 50 MG tablet Take 1 tablet (50 mg total) by mouth every morning AND 0.5 tablets (25 mg total) every evening. 90 tablet 2   atorvastatin (LIPITOR) 20 MG tablet Take 1 tablet (20 mg total) by mouth daily. 90 tablet 3   blood glucose meter kit and supplies KIT Dispense based on patient and insurance preference. Use up to four times daily as directed. 1 each 0   cetirizine (ZYRTEC) 10 MG tablet Take 10 mg by mouth daily as needed for allergies.     clotrimazole (CLOTRIMAZOLE AF) 1 % cream Apply topically twice daily as needed (rash). 45 g 2   Cyanocobalamin (B-12 SL) Place 1 tablet under the tongue daily.     cyclobenzaprine  (FLEXERIL) 10 MG tablet Take 1 tablet (10 mg total) by mouth 2 (two) times daily as needed for muscle spasms. 90 tablet 3   fenofibrate (TRICOR) 145 MG tablet Take 1 tablet (145 mg total) by mouth daily. 90 tablet 1   folic acid (FOLVITE) 1 MG tablet Take 1 tablet (1 mg total) by mouth daily.     furosemide (LASIX) 80 MG tablet Take 1 tablet by mouth 2 times daily as needed. 360 tablet 0   gabapentin (NEURONTIN) 800 MG tablet Take 1 tablet (800 mg total) by mouth 3 (three) times daily. 270 tablet 1   glipiZIDE (GLUCOTROL XL) 10 MG 24 hr tablet Take 1 tablet (10 mg total) by mouth daily with breakfast. 90 tablet 0   lidocaine-prilocaine (EMLA) cream Apply 1 Application topically as needed (apply to both feet  daily). 30 g 0   meloxicam (MOBIC) 15 MG tablet TAKE 1 TABLET BY MOUTH ONCE DAILY AS NEEDED FOR INFLAMMATION 90 tablet 1   metFORMIN (GLUCOPHAGE-XR) 500 MG 24 hr tablet Take 1 tablet (500 mg) by mouth daily with breakfast. 90 tablet 0   methimazole (TAPAZOLE) 5 MG tablet Take 1 tablet (5 mg total) by mouth every morning. 90 tablet 1   pantoprazole (PROTONIX) 40 MG tablet TAKE 1 TABLET BY MOUTH DAILY * PT NEEDS APPT 90 tablet 1   sildenafil (VIAGRA) 100 MG tablet Take 1/2-1 tablet by mouth daily as needed for erectile dysfunction. 30 tablet 2   tazarotene (AVAGE) 0.1 % cream SMARTSIG:1 Application Topical Every Evening     tretinoin (RETIN-A) 0.05 % cream Apply 1 application. topically daily as needed (oil skin). 45 g 3  triamcinolone cream (KENALOG) 0.1 % Apply to affected area(s) twice daily for 14 days (Patient taking differently: 1 Application 2 (two) times daily as needed (irritation).) 45 g 0   No current facility-administered medications on file prior to visit.    No Known Allergies   DIAGNOSTIC DATA (LABS, IMAGING, TESTING) - I reviewed patient records, labs, notes, testing and imaging myself where available.  Lab Results  Component Value Date   WBC 8.5 10/13/2022   HGB 15.6  10/13/2022   HCT 46.0 10/13/2022   MCV 90.7 10/13/2022   PLT 263.0 10/13/2022      Component Value Date/Time   NA 135 04/06/2023 1132   NA 136 04/29/2022 1222   K 3.7 04/06/2023 1132   CL 95 (L) 04/06/2023 1132   CO2 29 04/06/2023 1132   GLUCOSE 153 (H) 04/06/2023 1132   BUN 13 04/06/2023 1132   BUN 16 04/29/2022 1222   CREATININE 0.99 04/06/2023 1132   CREATININE 0.82 08/08/2020 1036   CALCIUM 9.3 04/06/2023 1132   PROT 7.5 10/13/2022 0908   PROT 7.4 11/29/2018 1715   ALBUMIN 4.4 10/13/2022 0908   ALBUMIN 4.4 11/29/2018 1715   AST 39 (H) 10/13/2022 0908   ALT 48 10/13/2022 0908   ALKPHOS 104 10/13/2022 0908   BILITOT 0.8 10/13/2022 0908   BILITOT 0.4 11/29/2018 1715   GFRNONAA >60 09/14/2022 1232   GFRAA >60 06/06/2020 1156   Lab Results  Component Value Date   CHOL 117 04/06/2023   HDL 33.00 (L) 04/06/2023   LDLCALC 44 04/06/2023   LDLDIRECT 71.0 10/13/2022   TRIG 198.0 (H) 04/06/2023   CHOLHDL 4 04/06/2023   Lab Results  Component Value Date   HGBA1C 7.0 (A) 04/06/2023   Lab Results  Component Value Date   VITAMINB12 643 01/06/2022   Lab Results  Component Value Date   TSH 1.99 12/14/2022    PHYSICAL EXAM:  Today's Vitals   06/06/23 1024  BP: 112/66  Pulse: 68  Weight: 280 lb (127 kg)  Height: 6\' 2"  (1.88 m)   Body mass index is 35.95 kg/m.   Wt Readings from Last 3 Encounters:  06/06/23 280 lb (127 kg)  05/24/23 285 lb (129.3 kg)  04/06/23 279 lb 6.4 oz (126.7 kg)     Ht Readings from Last 3 Encounters:  06/06/23 6\' 2"  (1.88 m)  05/24/23 6\' 2"  (1.88 m)  04/06/23 6\' 2"  (1.88 m)      General:  The patient is awake, alert and appears not in acute distress. The patient is well groomed. Head: Normocephalic, atraumatic. Neck is supple. Mallampati 2 - 3 peaked and narrow palate. ,  neck circumference:20 inches . Nasal airflow was  patent.  Retrognathia is not seen.  Dental status: intact Cardiovascular:  Regular rate and cardiac rhythm by  pulse,  without distended neck veins. Respiratory: Lungs are clear to auscultation.  Skin:  With evidence of ankle edema- more on the right foot  than left-   red palms and red feet, flushed face.    Neurologic exam : The patient is awake and alert, oriented to place and time.   Memory subjective described as intact.  Attention span & concentration ability appears normal.  Speech is fluent,  with, dysphonia.  Mood and affect are appropriate.   Cranial nerves: no loss of smell or taste reported  Pupils are equal and not reactive to light. 2-3 mm wide.  Funduscopic exam deferred. .  Extraocular movements in vertical and horizontal planes  were intact and without nystagmus. No Diplopia. Visual fields by finger perimetry are intact. Hearing was impaired to soft voice and finger rubbing.  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   Facial sensation intact to fine touch.  Facial motor strength is symmetric/ tongue and uvula move midline.  Neck ROM : rotation, tilt and flexion extension were normal for age and shoulder shrug was symmetrical.    Motor exam:  Symmetric bulk, tone and ROM.   Normal tone without cog wheeling, symmetrically affected grip strength . Carpaltunnel    Sensory:  Fine touch, and vibration were tested  and  severely decreased in both feet- diabetic neuropathy.   Vibration was not felt on left foot at all. Feet an hands tingling- this is neuropathy in his hands. THIS is ascending and is basically the same examination result since 2022.  Proprioception tested in the upper extremities was normal.   Coordination: Rapid alternating movements in the fingers/hands were of normal speed.  The Finger-to-nose maneuver was intact without evidence of ataxia, dysmetria or tremor.   Gait and station: Patient could rise unassisted from a seated position, walked without assistive device. Left arm less arm swing and limp- left sciatic pain.  Stance is of normal width/ base and the patient turned  with 5 steps.  Toe and heel walk were deferred.  Deep tendon reflexes: in the upper extremities are absent in both legs.    After spending a total time of 45 minutes face to face and additional time for physical and neurologic examination, review of laboratory studies,  personal review of imaging studies, reports and results of other testing and review of referral information / records as far as provided in visit, I have established the following assessments:      ASSESSMENT AND PLAN 67 y.o. year old male  here with:  Possible double crush injuries at the upper extremities - also - he has likely diabetic  length dependent  neuropathy as well, its likely superimposed. DM diagnosed only 2 years ago.    Dr Trevor Mace EMG and NCV Summary: she referred to [facility].  Hand surgery referral faxed to [facility] [place] (fax# [phone], phone# [phone])     1) OSA on CPAP.  The patient has been highly pliant with CPAP 100% of the last 30 days 27 of these days over 4 hours with an average of 6 hours.  Uses an auto CPAP between 8 and 20 cm water pressure with 3 cm EPR, his 95th percentile pressure is 14 cm water he does have still high air leakage but very few residual apneas.  His AHI is 1.3/h.  So I do not need to change any of the settings I will like for him to get the best fitting mask possible.          NP follow up within 12 months.   I would like to thank Shirline Frees, NP and No referring provider defined for this encounter. for allowing me to meet with and to take care of this pleasant patient.     After spending a total time of  25  minutes face to face and additional time for physical and neurologic exam, personal review of imaging studies, reports and results of other testing and review of referral information / records as far as provided in visit,   Electronically signed by: Melvyn Novas, MD 06/06/2023 10:54 AM  [facility]  and [facility] Board certified by The American  Board of Sleep Medicine and Diplomate of the American Academy of Sleep Medicine. Board certified In Neurology through the ABPN, Fellow of the [employer].

## 2023-06-06 NOTE — Patient Instructions (Addendum)
ASSESSMENT AND PLAN 67 y.o. year old male  here with:  Possible double crush injuries at the upper extremities - also - he has likely diabetic  length dependent  neuropathy as well, its likely superimposed. DM diagnosed only 2 years ago.    Dr Trevor Mace EMG and NCV Summary: [facility].  Hand surgery referral faxed to [facility] [place] (fax# [phone], phone# [phone])     1) OSA on CPAP.  The patient has been highly pliant with CPAP 100% of the last 30 days 27 of these days over 4 hours with an average of 6 hours.  Uses an auto CPAP between 8 and 20 cm water pressure with 3 cm EPR, his 95th percentile pressure is 14 cm water he does have still high air leakage but very few residual apneas.  His AHI is 1.3/h.  So I do not need to change any of the settings I will like for him to get the best fitting mask possible.          NP follow up within 12 months.   I would like to thank Shirline Frees, NP and No referring provider defined for this encounter. for allowing me to meet with and to take care of this pleasant patient.     Healthy Living: Sleep In this video, you will learn why sleep is an important part of a healthy lifestyle. To view the content, go to this web address: [email or web address]  This video will expire on: 05/01/2025. If you need access to this video following this date, please reach out to the healthcare provider who assigned it to you. This information is not intended to replace advice given to you by your health care provider. Make sure you discuss any questions you have with your health care provider. Elsevier Patient Education  2024 [employer].

## 2023-06-08 DIAGNOSIS — K439 Ventral hernia without obstruction or gangrene: Secondary | ICD-10-CM | POA: Diagnosis not present

## 2023-06-08 DIAGNOSIS — R310 Gross hematuria: Secondary | ICD-10-CM | POA: Diagnosis not present

## 2023-06-08 DIAGNOSIS — K760 Fatty (change of) liver, not elsewhere classified: Secondary | ICD-10-CM | POA: Diagnosis not present

## 2023-06-08 DIAGNOSIS — K429 Umbilical hernia without obstruction or gangrene: Secondary | ICD-10-CM | POA: Diagnosis not present

## 2023-06-17 ENCOUNTER — Encounter (HOSPITAL_COMMUNITY): Payer: Self-pay | Admitting: Physician Assistant

## 2023-06-17 ENCOUNTER — Ambulatory Visit (HOSPITAL_COMMUNITY)
Admission: RE | Admit: 2023-06-17 | Discharge: 2023-06-17 | Disposition: A | Discharge status: Home / Self Care | Payer: Medicare HMO | Source: Ambulatory Visit | Attending: Physician Assistant | Admitting: Physician Assistant

## 2023-06-17 VITALS — BP 118/72 | HR 80 | Ht 74.0 in | Wt 280.6 lb

## 2023-06-17 DIAGNOSIS — D6869 Other thrombophilia: Secondary | ICD-10-CM | POA: Diagnosis not present

## 2023-06-17 DIAGNOSIS — I2510 Atherosclerotic heart disease of native coronary artery without angina pectoris: Secondary | ICD-10-CM | POA: Diagnosis not present

## 2023-06-17 DIAGNOSIS — I4819 Other persistent atrial fibrillation: Secondary | ICD-10-CM | POA: Diagnosis not present

## 2023-06-17 DIAGNOSIS — G4733 Obstructive sleep apnea (adult) (pediatric): Secondary | ICD-10-CM | POA: Insufficient documentation

## 2023-06-17 DIAGNOSIS — I10 Essential (primary) hypertension: Secondary | ICD-10-CM | POA: Diagnosis not present

## 2023-06-17 NOTE — Progress Notes (Signed)
Primary Care Physician: Shirline Frees, NP Referring Physician: Dr. Elberta Fortis Primary Cardiologist: Dr Tarri Abernethy is a 67 y.o. male with a h/o persistent afib that was scheduled to have an ablation in August but got sick and it was postponed until 11/7. He was advised to cone to afib clinic to discuss meds that may help restore SR until then.  He states that at rest he feels ok. Mostly when he is symptomatic is with walking a fair distance.   We  discussed meds that may help to restore SR as a bridge to ablation. He does  have thyroid disease, on tapazole, so I think amiodarone would not be best option.  Pt does not want to pursue Tikosyn for the necessary hospitalization. He does have multivessel CAD by chest CT. Recent stress test did not show any ischemia. Multaq would be price prohibitive for him as he is in the donut hole. Use of flecainide would probably entail another cardioversion and he does not know if he wants to go thru with another cardioversion. We also discussed additional dose of atenolol as he is just on this once a day and he is in agreement with this.   Follow up in the AF clinic 10/12/22. Patient is s/p afib ablation with Dr Elberta Fortis on 09/14/22. He states that he felt well for about one week but started noticing more dyspnea with exertion. He is in afib today. He denies chest pain, swallowing pain, or groin issues. Patient is s/p repeat DCCV on 10/18/22.  Follow up in the AF clinic 06/17/23. Patient reports that he has done very well since his last visit. He has not had any interim symptoms of afib. No bleeding issues on anticoagulation.   Today, he denies symptoms of palpitations, chest pain, orthopnea, PND, lower extremity edema, dizziness, presyncope, syncope, or neurologic sequela. The patient is tolerating medications without difficulties and is otherwise without complaint today.   Past Medical History:  Diagnosis Date   Arthritis    in back   Atrial  fibrillation (HCC)    Blood clot in vein 1995   bil legs   Bulging lumbar disc    Cataract    mild   Cellulitis 2012   forehead   Complication of anesthesia    had trouble waking up 06/11/2020   DDD (degenerative disc disease), lumbar    Diabetes mellitus without complication (HCC)    GERD (gastroesophageal reflux disease)    Graves disease    Hx of adenomatous colonic polyps 12/15/2017   Hyperlipidemia    Hypertension    Neuromuscular disorder (HCC)    neuropathy in feet   OSA on CPAP    Thyrotoxicosis without thyroid storm    Varicose veins of both lower extremities     Current Outpatient Medications  Medication Sig Dispense Refill   acetaminophen (TYLENOL) 325 MG tablet Take 2 tablets (650 mg total) by mouth every 6 (six) hours as needed for mild pain (or Fever >/= 101).     apixaban (ELIQUIS) 5 MG TABS tablet Take 1 tablet (5 mg total) by mouth 2 (two) times daily. 60 tablet 5   atenolol (TENORMIN) 50 MG tablet Take 1 tablet (50 mg total) by mouth every morning AND 0.5 tablets (25 mg total) every evening. 90 tablet 2   atorvastatin (LIPITOR) 20 MG tablet Take 1 tablet (20 mg total) by mouth daily. 90 tablet 3   blood glucose meter kit and supplies KIT Dispense based on  patient and insurance preference. Use up to four times daily as directed. 1 each 0   cetirizine (ZYRTEC) 10 MG tablet Take 10 mg by mouth daily as needed for allergies.     clotrimazole (CLOTRIMAZOLE AF) 1 % cream Apply topically twice daily as needed (rash). 45 g 2   Cyanocobalamin (B-12 SL) Place 1 tablet under the tongue daily.     cyclobenzaprine (FLEXERIL) 10 MG tablet Take 1 tablet (10 mg total) by mouth 2 (two) times daily as needed for muscle spasms. 90 tablet 3   fenofibrate (TRICOR) 145 MG tablet Take 1 tablet (145 mg total) by mouth daily. 90 tablet 1   folic acid (FOLVITE) 1 MG tablet Take 1 tablet (1 mg total) by mouth daily.     furosemide (LASIX) 80 MG tablet Take 1 tablet by mouth 2 times daily as  needed. (Patient taking differently: Take 160 mg by mouth daily.) 360 tablet 0   gabapentin (NEURONTIN) 800 MG tablet Take 1 tablet (800 mg total) by mouth 3 (three) times daily. (Patient taking differently: Take 800 mg by mouth 2 (two) times daily.) 270 tablet 1   glipiZIDE (GLUCOTROL XL) 10 MG 24 hr tablet Take 1 tablet (10 mg total) by mouth daily with breakfast. 90 tablet 0   lidocaine-prilocaine (EMLA) cream Apply 1 Application topically as needed (apply to both feet  daily). 30 g 0   meloxicam (MOBIC) 15 MG tablet TAKE 1 TABLET BY MOUTH ONCE DAILY AS NEEDED FOR INFLAMMATION 90 tablet 1   metFORMIN (GLUCOPHAGE-XR) 500 MG 24 hr tablet Take 1 tablet (500 mg) by mouth daily with breakfast. 90 tablet 0   methimazole (TAPAZOLE) 5 MG tablet Take 1 tablet (5 mg total) by mouth every morning. 90 tablet 1   pantoprazole (PROTONIX) 40 MG tablet TAKE 1 TABLET BY MOUTH DAILY * PT NEEDS APPT 90 tablet 1   sildenafil (VIAGRA) 100 MG tablet Take 1/2-1 tablet by mouth daily as needed for erectile dysfunction. 30 tablet 2   tazarotene (AVAGE) 0.1 % cream SMARTSIG:1 Application Topical Every Evening     tretinoin (RETIN-A) 0.05 % cream Apply 1 application. topically daily as needed (oil skin). 45 g 3   triamcinolone cream (KENALOG) 0.1 % Apply to affected area(s) twice daily for 14 days (Patient taking differently: 1 Application 2 (two) times daily as needed (irritation).) 45 g 0   No current facility-administered medications for this encounter.    ROS- All systems are reviewed and negative except as per the HPI above  Physical Exam: Vitals:   06/17/23 1154  BP: 118/72  Pulse: 80  Weight: 127.3 kg  Height: 6\' 2"  (1.88 m)     Wt Readings from Last 3 Encounters:  06/17/23 127.3 kg  06/06/23 127 kg  05/24/23 129.3 kg    GEN: Well nourished, well developed in no acute distress NECK: No JVD; No carotid bruits CARDIAC: Regular rate and rhythm, no murmurs, rubs, gallops RESPIRATORY:  Clear to  auscultation without rales, wheezing or rhonchi  ABDOMEN: Soft, non-tender, non-distended EXTREMITIES:  No edema; No deformity    EKG today demonstrates SR Vent. rate 80 BPM PR interval 188 ms QRS duration 78 ms QT/QTcB 388/447 ms   CHA2DS2-VASc Score = 4  The patient's score is based upon: CHF History: 0 HTN History: 1 Diabetes History: 1 Stroke History: 0 Vascular Disease History: 1 Age Score: 1 Gender Score: 0        ASSESSMENT AND PLAN: Persistent Atrial Fibrillation (ICD10:  I48.19)  The patient's CHA2DS2-VASc score is 4, indicating a 4.8% annual risk of stroke.   S/p afib ablation 09/14/22 Patient appears to be maintaining SR.  If AAD is needed, would avoid amiodarone with hyperthyroidism on methimazole. Multaq is cost prohibitive. Would like to avoid class IC with CAD although myoview showed no ischemia. Could consider dofetilide.  Continue Xarelto 20 mg daily   Secondary Hypercoagulable State (ICD10:  D68.69) The patient is at significant risk for stroke/thromboembolism based upon his CHA2DS2-VASc Score of 4.  Continue Rivaroxaban (Xarelto).   HTN Stable on current regimen  OSA  Encouraged nightly CPAP  CAD CAC score 315 Myoview 01/20/22, no ischemia.  No anginal symptoms   Follow up with Dr Elberta Fortis in 6 months and with AF Clinic in one year.    Jorja Loa PA-C [facility] [address] [place], [place] [address] [phone]

## 2023-06-22 ENCOUNTER — Ambulatory Visit: Payer: Medicare HMO | Admitting: Internal Medicine

## 2023-06-22 ENCOUNTER — Encounter: Payer: Self-pay | Admitting: Internal Medicine

## 2023-06-22 VITALS — BP 128/80 | HR 62 | Ht 74.0 in | Wt 275.6 lb

## 2023-06-22 DIAGNOSIS — E1142 Type 2 diabetes mellitus with diabetic polyneuropathy: Secondary | ICD-10-CM

## 2023-06-22 DIAGNOSIS — E785 Hyperlipidemia, unspecified: Secondary | ICD-10-CM

## 2023-06-22 DIAGNOSIS — R310 Gross hematuria: Secondary | ICD-10-CM | POA: Diagnosis not present

## 2023-06-22 DIAGNOSIS — E1159 Type 2 diabetes mellitus with other circulatory complications: Secondary | ICD-10-CM | POA: Diagnosis not present

## 2023-06-22 DIAGNOSIS — Z7984 Long term (current) use of oral hypoglycemic drugs: Secondary | ICD-10-CM | POA: Diagnosis not present

## 2023-06-22 DIAGNOSIS — E119 Type 2 diabetes mellitus without complications: Secondary | ICD-10-CM

## 2023-06-22 DIAGNOSIS — E0500 Thyrotoxicosis with diffuse goiter without thyrotoxic crisis or storm: Secondary | ICD-10-CM | POA: Diagnosis not present

## 2023-06-22 DIAGNOSIS — N3000 Acute cystitis without hematuria: Secondary | ICD-10-CM | POA: Diagnosis not present

## 2023-06-22 DIAGNOSIS — N453 Epididymo-orchitis: Secondary | ICD-10-CM | POA: Diagnosis not present

## 2023-06-22 LAB — HEMOGLOBIN A1C: Hgb A1c MFr Bld: 7.3 % — ABNORMAL HIGH (ref 4.6–6.5)

## 2023-06-22 LAB — T3, FREE: T3, Free: 3.2 pg/mL (ref 2.3–4.2)

## 2023-06-22 LAB — TSH: TSH: 3.44 u[IU]/mL (ref 0.35–5.50)

## 2023-06-22 LAB — T4, FREE: Free T4: 1.04 ng/dL (ref 0.60–1.60)
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
## 2023-06-22 NOTE — Patient Instructions (Addendum)
Please continue methimazole 5 mg every day.  Please continue: - Jardiance 10 mg daily  - Metformin ER 500 mg daily in am  If you cannot use Jardiance, then use Glipizide XL 10 mg before b'fast.  Please stop at the lab.  Please return in 3-4 months, but possibly sooner for labs.

## 2023-06-23 ENCOUNTER — Encounter (INDEPENDENT_AMBULATORY_CARE_PROVIDER_SITE_OTHER): Payer: Self-pay

## 2023-06-23 MED ORDER — METHIMAZOLE 5 MG PO TABS
5.0000 mg | ORAL_TABLET | ORAL | 3 refills | Status: DC
Start: 2023-06-23 — End: 2023-10-20

## 2023-07-13 ENCOUNTER — Encounter: Payer: Self-pay | Admitting: Adult Health

## 2023-07-13 ENCOUNTER — Ambulatory Visit (INDEPENDENT_AMBULATORY_CARE_PROVIDER_SITE_OTHER): Payer: Medicare HMO | Admitting: Adult Health

## 2023-07-13 VITALS — BP 120/60 | HR 65 | Temp 97.8°F | Ht 74.0 in | Wt 278.0 lb

## 2023-07-13 DIAGNOSIS — I10 Essential (primary) hypertension: Secondary | ICD-10-CM

## 2023-07-13 DIAGNOSIS — E119 Type 2 diabetes mellitus without complications: Secondary | ICD-10-CM | POA: Diagnosis not present

## 2023-07-13 DIAGNOSIS — Z7984 Long term (current) use of oral hypoglycemic drugs: Secondary | ICD-10-CM

## 2023-07-13 NOTE — Progress Notes (Signed)
Subjective:    Patient ID: Jeffrey Price, male    DOB: 02-Aug-1956, 67 y.o.   MRN: [id number]  HPI  67 year old male who  has a past medical history of Arthritis, Atrial fibrillation (HCC), Blood clot in vein (1995), Bulging lumbar disc, Cataract, Cellulitis (2012), Complication of anesthesia, DDD (degenerative disc disease), lumbar, Diabetes mellitus without complication (HCC), GERD (gastroesophageal reflux disease), Graves disease, adenomatous colonic polyps (12/15/2017), Hyperlipidemia, Hypertension, Neuromuscular disorder (HCC), OSA on CPAP, Thyrotoxicosis without thyroid storm, and Varicose veins of both lower extremities.  Diabetes mellitus type 2-managed with metformin 500 mg ER and Glipizide 10 mg XR. He was changed three months ago from [place] to Glipizide due to cost but he did not start this medication. He was also seen [facility] endocrinology and they will take over managing his diabetes. They want him to stay on Jardiance and he is wondering if we have any samples.    Lab Results  Component Value Date   HGBA1C 7.3 (H) 06/22/2023   Hypertension-managed with Atenolol 50 mg in the am and 25 mg in the PM ( he has been taking 50 mg BID).  He denies dizziness, lightheadedness, chest pain, or shortness of breath BP Readings from Last 3 Encounters:  07/13/23 120/60  06/22/23 128/80  06/17/23 118/72    Review of Systems See HPI   Past Medical History:  Diagnosis Date   Arthritis    in back   Atrial fibrillation (HCC)    Blood clot in vein 1995   bil legs   Bulging lumbar disc    Cataract    mild   Cellulitis 2012   forehead   Complication of anesthesia    had trouble waking up 06/11/2020   DDD (degenerative disc disease), lumbar    Diabetes mellitus without complication (HCC)    GERD (gastroesophageal reflux disease)    Graves disease    Hx of adenomatous colonic polyps 12/15/2017   Hyperlipidemia    Hypertension    Neuromuscular disorder (HCC)    neuropathy in feet    OSA on CPAP    Thyrotoxicosis without thyroid storm    Varicose veins of both lower extremities     Social History   Socioeconomic History   Marital status: Significant Other    Spouse name: Not on file   Number of children: 1   Years of education: Not on file   Highest education level: Some college, no degree  Occupational History   Occupation: Retiring  Tobacco Use   Smoking status: Former    Current packs/day: 0.00    Types: E-cigarettes, Cigarettes    Quit date: 12/03/2011    Years since quitting: 11.6   Smokeless tobacco: Never   Tobacco comments:    Former smoker 10/12/22  Vaping Use   Vaping status: Every Day   Substances: Nicotine, Flavoring  Substance and Sexual Activity   Alcohol use: Yes    Comment: whiskey, Burbon, as few drinks per night and more on weekends 10/12/22   Drug use: No   Sexual activity: Not on file  Other Topics Concern   Not on file  Social History Narrative   Patient is widowed.    He has girlfriend.   Right-handed.   Caffeine use: 2-3 cups per day.      Social Determinants of Health   Financial Resource Strain: Low Risk  (12/08/2022)   Overall Financial Resource Strain (CARDIA)    Difficulty of Paying Living Expenses: Not hard at  all  Food Insecurity: No Food Insecurity (12/08/2022)   Hunger Vital Sign    Worried About Running Out of Food in the Last Year: Never true    Ran Out of Food in the Last Year: Never true  Transportation Needs: No Transportation Needs (12/08/2022)   PRAPARE - Administrator, Civil Service (Medical): No    Lack of Transportation (Non-Medical): No  Physical Activity: Unknown (12/08/2022)   Exercise Vital Sign    Days of Exercise per Week: 0 days    Minutes of Exercise per Session: Not on file  Stress: No Stress Concern Present (12/08/2022)   [employer] of Occupational Health - Occupational Stress Questionnaire    Feeling of Stress : Not at all  Social Connections: Moderately Integrated  (12/08/2022)   Social Connection and Isolation Panel [NHANES]    Frequency of Communication with Friends and Family: More than three times a week    Frequency of Social Gatherings with Friends and Family: More than three times a week    Attends Religious Services: More than 4 times per year    Active Member of [employer] or Organizations: Yes    Attends Banker Meetings: More than 4 times per year    Marital Status: Widowed  Intimate Partner Violence: Not At Risk (12/08/2022)   Humiliation, Afraid, Rape, and Kick questionnaire    Fear of Current or Ex-Partner: No    Emotionally Abused: No    Physically Abused: No    Sexually Abused: No    Past Surgical History:  Procedure Laterality Date   ANTERIOR CERVICAL DECOMP/DISCECTOMY FUSION N/A 06/18/2021   Procedure: ANTERIOR CERVICAL DECOMPRESSION FUSION CERVICAL4- CERVICAL 5 WITH INSTRUEMENTATION AND ALLOGRAFT;  Surgeon: Estill Bamberg, MD;  Location: [facility] OR;  Service: Orthopedics;  Laterality: N/A;   ATRIAL FIBRILLATION ABLATION N/A 09/14/2022   Procedure: ATRIAL FIBRILLATION ABLATION;  Surgeon: Regan Lemming, MD;  Location: [facility] INVASIVE CV LAB;  Service: Cardiovascular;  Laterality: N/A;   BACK SURGERY  06/11/2020   BUBBLE STUDY  09/10/2022   Procedure: BUBBLE STUDY;  Surgeon: Christell Constant, MD;  Location: [facility] ENDOSCOPY;  Service: Cardiovascular;;   CARDIOVERSION N/A 12/22/2021   Procedure: CARDIOVERSION;  Surgeon: Wendall Stade, MD;  Location: [facility] ENDOSCOPY;  Service: Cardiovascular;  Laterality: N/A;   CARDIOVERSION N/A 10/18/2022   Procedure: CARDIOVERSION;  Surgeon: Parke Poisson, MD;  Location: [facility]COPY;  Service: Cardiovascular;  Laterality: N/A;   ENDOVENOUS ABLATION SAPHENOUS VEIN W/ LASER Left 10/18/2019   endovenous laser ablation left greater saphenous vein and stab phlebectomy > 20 incisions left leg by Cari Caraway MD    ENDOVENOUS ABLATION SAPHENOUS VEIN W/ LASER Right 11/08/2019   endovenous laser  ablation right greater saphenous vein and stab phlebectomy 10-20 incisions right leg by Cari Caraway MD    LUMBAR EPIDURAL INJECTION     TEE WITHOUT CARDIOVERSION N/A 12/22/2021   Procedure: TRANSESOPHAGEAL ECHOCARDIOGRAM (TEE);  Surgeon: Wendall Stade, MD;  Location: [facility] ENDOSCOPY;  Service: Cardiovascular;  Laterality: N/A;   TEE WITHOUT CARDIOVERSION N/A 09/10/2022   Procedure: TRANSESOPHAGEAL ECHOCARDIOGRAM (TEE);  Surgeon: Christell Constant, MD;  Location: [facility] ENDOSCOPY;  Service: Cardiovascular;  Laterality: N/A;    Family History  Problem Relation Age of Onset   Thyroid disease Mother    Parkinson's disease Father    Colon cancer Neg Hx    Rectal cancer Neg Hx    Stomach cancer Neg Hx    Esophageal cancer Neg Hx  No Known Allergies  Current Outpatient Medications on File Prior to Visit  Medication Sig Dispense Refill   acetaminophen (TYLENOL) 325 MG tablet Take 2 tablets (650 mg total) by mouth every 6 (six) hours as needed for mild pain (or Fever >/= 101).     apixaban (ELIQUIS) 5 MG TABS tablet Take 1 tablet (5 mg total) by mouth 2 (two) times daily. 60 tablet 5   atenolol (TENORMIN) 50 MG tablet Take 1 tablet (50 mg total) by mouth every morning AND 0.5 tablets (25 mg total) every evening. 90 tablet 2   atorvastatin (LIPITOR) 20 MG tablet Take 1 tablet (20 mg total) by mouth daily. 90 tablet 3   blood glucose meter kit and supplies KIT Dispense based on patient and insurance preference. Use up to four times daily as directed. 1 each 0   cetirizine (ZYRTEC) 10 MG tablet Take 10 mg by mouth daily as needed for allergies.     clotrimazole (CLOTRIMAZOLE AF) 1 % cream Apply topically twice daily as needed (rash). 45 g 2   clotrimazole-betamethasone (LOTRISONE) cream Apply topically 2 (two) times daily.     Cyanocobalamin (B-12 SL) Place 1 tablet under the tongue daily.     cyclobenzaprine (FLEXERIL) 10 MG tablet Take 1 tablet (10 mg total) by mouth 2 (two) times daily as  needed for muscle spasms. 90 tablet 3   fenofibrate (TRICOR) 145 MG tablet Take 1 tablet (145 mg total) by mouth daily. 90 tablet 1   folic acid (FOLVITE) 1 MG tablet Take 1 tablet (1 mg total) by mouth daily.     furosemide (LASIX) 80 MG tablet Take 1 tablet by mouth 2 times daily as needed. (Patient taking differently: Take 160 mg by mouth daily.) 360 tablet 0   gabapentin (NEURONTIN) 800 MG tablet Take 1 tablet (800 mg total) by mouth 3 (three) times daily. (Patient taking differently: Take 800 mg by mouth 2 (two) times daily.) 270 tablet 1   glipiZIDE (GLUCOTROL XL) 10 MG 24 hr tablet Take 1 tablet (10 mg total) by mouth daily with breakfast. 90 tablet 0   lidocaine-prilocaine (EMLA) cream Apply 1 Application topically as needed (apply to both feet  daily). 30 g 0   meloxicam (MOBIC) 15 MG tablet TAKE 1 TABLET BY MOUTH ONCE DAILY AS NEEDED FOR INFLAMMATION 90 tablet 1   metFORMIN (GLUCOPHAGE-XR) 500 MG 24 hr tablet Take 1 tablet (500 mg) by mouth daily with breakfast. 90 tablet 0   methimazole (TAPAZOLE) 5 MG tablet Take 1 tablet (5 mg total) by mouth every other day. 45 tablet 3   pantoprazole (PROTONIX) 40 MG tablet TAKE 1 TABLET BY MOUTH DAILY * PT NEEDS APPT 90 tablet 1   sildenafil (VIAGRA) 100 MG tablet Take 1/2-1 tablet by mouth daily as needed for erectile dysfunction. 30 tablet 2   tamsulosin (FLOMAX) 0.4 MG CAPS capsule Take 0.4 mg by mouth daily.     tazarotene (AVAGE) 0.1 % cream SMARTSIG:1 Application Topical Every Evening     tretinoin (RETIN-A) 0.05 % cream Apply 1 application. topically daily as needed (oil skin). 45 g 3   triamcinolone cream (KENALOG) 0.1 % Apply to affected area(s) twice daily for 14 days (Patient taking differently: 1 Application 2 (two) times daily as needed (irritation).) 45 g 0   No current facility-administered medications on file prior to visit.    BP 120/60   Pulse 65   Temp 97.8 F (36.6 C) (Oral)   Ht 6\' 2"  (1.88  m)   Wt 278 lb (126.1 kg)    SpO2 95%   BMI 35.69 kg/m       Objective:   Physical Exam Vitals and nursing note reviewed.  Constitutional:      Appearance: Normal appearance. He is obese.  Cardiovascular:     Rate and Rhythm: Normal rate and regular rhythm.     Pulses: Normal pulses.     Heart sounds: Normal heart sounds.  Pulmonary:     Effort: Pulmonary effort is normal.     Breath sounds: Normal breath sounds.  Skin:    General: Skin is warm and dry.  Neurological:     General: No focal deficit present.     Mental Status: He is alert and oriented to person, place, and time.  Psychiatric:        Mood and Affect: Mood normal.        Behavior: Behavior normal.        Thought Content: Thought content normal.        Judgment: Judgment normal.        Assessment & Plan:  1. Diabetes mellitus treated with oral medication (HCC) Medication Samples have been provided to the patient.  Drug name: Jardiance       Strength: 10mg         Qty: 21  LOT: [id number]  Exp.Date: Jan 2026  Dosing instructions: Take one daily.   The patient has been instructed regarding the correct time, dose, and frequency of taking this medication, including desired effects and most common side effects.   - Will have him follow up with Endocrinology for DM   2. Essential hypertension - Well controlled. No change in medication   Shirline Frees, NP

## 2023-08-09 ENCOUNTER — Other Ambulatory Visit (HOSPITAL_COMMUNITY): Payer: Self-pay

## 2023-08-09 ENCOUNTER — Other Ambulatory Visit: Payer: Self-pay | Admitting: Adult Health

## 2023-08-09 DIAGNOSIS — E1142 Type 2 diabetes mellitus with diabetic polyneuropathy: Secondary | ICD-10-CM

## 2023-08-10 ENCOUNTER — Other Ambulatory Visit: Payer: Self-pay

## 2023-08-10 ENCOUNTER — Other Ambulatory Visit (HOSPITAL_COMMUNITY): Payer: Self-pay

## 2023-08-10 MED ORDER — CLOTRIMAZOLE-BETAMETHASONE 1-0.05 % EX CREA
1.0000 | TOPICAL_CREAM | Freq: Two times a day (BID) | CUTANEOUS | 1 refills | Status: AC
  Filled 2023-08-10: qty 30, 30d supply, fill #0
  Filled 2023-10-05: qty 30, 30d supply, fill #1

## 2023-08-10 MED ORDER — METFORMIN HCL ER 500 MG PO TB24
500.0000 mg | ORAL_TABLET | Freq: Every day | ORAL | 0 refills | Status: DC
Start: 2023-08-10 — End: 2023-10-05
  Filled 2023-08-10: qty 90, 90d supply, fill #0

## 2023-08-10 NOTE — Telephone Encounter (Signed)
Okay for refill?  

## 2023-08-15 ENCOUNTER — Ambulatory Visit: Payer: PPO | Admitting: Adult Health

## 2023-08-22 ENCOUNTER — Encounter: Payer: Self-pay | Admitting: Internal Medicine

## 2023-08-22 ENCOUNTER — Encounter: Payer: Self-pay | Admitting: Cardiology

## 2023-08-23 ENCOUNTER — Telehealth: Payer: Self-pay

## 2023-08-23 NOTE — Telephone Encounter (Signed)
Patient came in to the office today and picked up samples (3 boxes) of Jardiance.

## 2023-08-23 NOTE — Telephone Encounter (Signed)
Sample  Medication:Jardiance Dose: 10 mg Quantity:3 boxes (21 pills) ZHY:[id number] EXP:11/2024  Xander Jutras,RMA   Pt has already picked up.,

## 2023-08-26 MED ORDER — APIXABAN 5 MG PO TABS: 5.0000 mg | ORAL_TABLET | Freq: Two times a day (BID) | ORAL | 0 refills | Status: DC

## 2023-08-26 NOTE — Telephone Encounter (Signed)
2 weeks of samples & assistance paperwork left at front desk

## 2023-09-20 ENCOUNTER — Other Ambulatory Visit (HOSPITAL_BASED_OUTPATIENT_CLINIC_OR_DEPARTMENT_OTHER): Payer: Self-pay | Admitting: Cardiovascular Disease

## 2023-09-20 DIAGNOSIS — E785 Hyperlipidemia, unspecified: Secondary | ICD-10-CM

## 2023-09-26 DIAGNOSIS — E119 Type 2 diabetes mellitus without complications: Secondary | ICD-10-CM | POA: Diagnosis not present

## 2023-09-26 LAB — HM DIABETES EYE EXAM

## 2023-10-05 ENCOUNTER — Other Ambulatory Visit: Payer: Self-pay | Admitting: Adult Health

## 2023-10-05 ENCOUNTER — Other Ambulatory Visit (HOSPITAL_COMMUNITY): Payer: Self-pay

## 2023-10-05 ENCOUNTER — Encounter (HOSPITAL_COMMUNITY): Payer: Self-pay | Admitting: Pharmacist

## 2023-10-05 DIAGNOSIS — E1142 Type 2 diabetes mellitus with diabetic polyneuropathy: Secondary | ICD-10-CM

## 2023-10-05 MED ORDER — METFORMIN HCL ER 500 MG PO TB24
500.0000 mg | ORAL_TABLET | Freq: Every day | ORAL | 0 refills | Status: DC
  Filled 2023-10-05 – 2023-10-17 (×2): qty 90, 90d supply, fill #0

## 2023-10-14 ENCOUNTER — Ambulatory Visit (INDEPENDENT_AMBULATORY_CARE_PROVIDER_SITE_OTHER): Payer: Medicare HMO | Admitting: Adult Health

## 2023-10-14 ENCOUNTER — Encounter: Payer: Self-pay | Admitting: Adult Health

## 2023-10-14 ENCOUNTER — Encounter: Payer: Self-pay | Admitting: Cardiology

## 2023-10-14 VITALS — BP 120/78 | HR 68 | Temp 98.3°F | Ht 74.0 in | Wt 279.4 lb

## 2023-10-14 DIAGNOSIS — Z0000 Encounter for general adult medical examination without abnormal findings: Secondary | ICD-10-CM

## 2023-10-14 DIAGNOSIS — I4819 Other persistent atrial fibrillation: Secondary | ICD-10-CM

## 2023-10-14 DIAGNOSIS — E119 Type 2 diabetes mellitus without complications: Secondary | ICD-10-CM | POA: Diagnosis not present

## 2023-10-14 DIAGNOSIS — G8929 Other chronic pain: Secondary | ICD-10-CM | POA: Diagnosis not present

## 2023-10-14 DIAGNOSIS — K219 Gastro-esophageal reflux disease without esophagitis: Secondary | ICD-10-CM

## 2023-10-14 DIAGNOSIS — Z125 Encounter for screening for malignant neoplasm of prostate: Secondary | ICD-10-CM | POA: Diagnosis not present

## 2023-10-14 DIAGNOSIS — M545 Low back pain, unspecified: Secondary | ICD-10-CM

## 2023-10-14 DIAGNOSIS — E0590 Thyrotoxicosis, unspecified without thyrotoxic crisis or storm: Secondary | ICD-10-CM

## 2023-10-14 DIAGNOSIS — E1142 Type 2 diabetes mellitus with diabetic polyneuropathy: Secondary | ICD-10-CM | POA: Diagnosis not present

## 2023-10-14 DIAGNOSIS — Z7984 Long term (current) use of oral hypoglycemic drugs: Secondary | ICD-10-CM

## 2023-10-14 DIAGNOSIS — G4733 Obstructive sleep apnea (adult) (pediatric): Secondary | ICD-10-CM | POA: Diagnosis not present

## 2023-10-14 DIAGNOSIS — E785 Hyperlipidemia, unspecified: Secondary | ICD-10-CM

## 2023-10-14 DIAGNOSIS — I10 Essential (primary) hypertension: Secondary | ICD-10-CM

## 2023-10-14 LAB — CBC
HCT: 44.1 % (ref 39.0–52.0)
Hemoglobin: 15.6 g/dL (ref 13.0–17.0)
MCHC: 35.4 g/dL (ref 30.0–36.0)
MCV: 89.7 fL (ref 78.0–100.0)
Platelets: 271 10*3/uL (ref 150.0–400.0)
RBC: 4.92 Mil/uL (ref 4.22–5.81)
RDW: 14 % (ref 11.5–15.5)
WBC: 9.5 10*3/uL (ref 4.0–10.5)

## 2023-10-14 LAB — TSH: TSH: 2.72 u[IU]/mL (ref 0.35–5.50)

## 2023-10-14 LAB — MICROALBUMIN / CREATININE URINE RATIO
Creatinine,U: 17.4 mg/dL
Microalb Creat Ratio: 4 mg/g (ref 0.0–30.0)
Microalb, Ur: <0.7 mg/dL (ref 0.0–1.9)

## 2023-10-14 LAB — LIPID PANEL
Cholesterol: 143 mg/dL (ref 0–200)
HDL: 38.8 mg/dL — ABNORMAL LOW (ref >39.00)
LDL Cholesterol: 68 mg/dL (ref 0–99)
NonHDL: 103.76
Total CHOL/HDL Ratio: 4
Triglycerides: 179 mg/dL — ABNORMAL HIGH (ref 0.0–149.0)
VLDL: 35.8 mg/dL (ref 0.0–40.0)

## 2023-10-14 LAB — COMPREHENSIVE METABOLIC PANEL
ALT: 46 U/L (ref 0–53)
AST: 39 U/L — ABNORMAL HIGH (ref 0–37)
Albumin: 4.4 g/dL (ref 3.5–5.2)
Alkaline Phosphatase: 105 U/L (ref 39–117)
BUN: 12 mg/dL (ref 6–23)
CO2: 32 meq/L (ref 19–32)
Calcium: 10 mg/dL (ref 8.4–10.5)
Chloride: 98 meq/L (ref 96–112)
Creatinine, Ser: 0.88 mg/dL (ref 0.40–1.50)
GFR: 89.36 mL/min (ref >60.00)
Glucose, Bld: 133 mg/dL — ABNORMAL HIGH (ref 70–99)
Potassium: 4 meq/L (ref 3.5–5.1)
Sodium: 139 meq/L (ref 135–145)
Total Bilirubin: 0.6 mg/dL (ref 0.2–1.2)
Total Protein: 7.8 g/dL (ref 6.0–8.3)

## 2023-10-14 LAB — PSA: PSA: 0.2 ng/mL (ref 0.10–4.00)

## 2023-10-14 NOTE — Telephone Encounter (Signed)
Called pt in regards to Eliquis.  Reports doesn't have any left of his regular prescription but has 1 box of 14 left of samples.  Reports he is in a donut hole until next year.  Advised will send message to primary RN to f/u.   Pt also asked about scheduling f/u OV.  Advised primary RN will f/u.
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
## 2023-10-14 NOTE — Telephone Encounter (Signed)
 Care team updated and letter sent for eye exam notes.

## 2023-10-17 ENCOUNTER — Other Ambulatory Visit (HOSPITAL_COMMUNITY): Payer: Self-pay

## 2023-10-17 ENCOUNTER — Other Ambulatory Visit: Payer: Self-pay | Admitting: Cardiovascular Disease

## 2023-10-17 ENCOUNTER — Other Ambulatory Visit: Payer: Self-pay | Admitting: Adult Health

## 2023-10-17 ENCOUNTER — Other Ambulatory Visit: Payer: Self-pay | Admitting: Cardiology

## 2023-10-17 ENCOUNTER — Other Ambulatory Visit: Payer: Self-pay

## 2023-10-17 DIAGNOSIS — E785 Hyperlipidemia, unspecified: Secondary | ICD-10-CM

## 2023-10-17 MED ORDER — APIXABAN 5 MG PO TABS: 5.0000 mg | ORAL_TABLET | Freq: Two times a day (BID) | ORAL | 6 refills | Status: DC

## 2023-10-18 ENCOUNTER — Other Ambulatory Visit: Payer: Self-pay | Admitting: Neurology

## 2023-10-18 ENCOUNTER — Other Ambulatory Visit (HOSPITAL_BASED_OUTPATIENT_CLINIC_OR_DEPARTMENT_OTHER): Payer: Self-pay | Admitting: Cardiovascular Disease

## 2023-10-18 ENCOUNTER — Other Ambulatory Visit: Payer: Self-pay | Admitting: Adult Health

## 2023-10-18 ENCOUNTER — Other Ambulatory Visit (HOSPITAL_COMMUNITY): Payer: Self-pay

## 2023-10-18 DIAGNOSIS — E785 Hyperlipidemia, unspecified: Secondary | ICD-10-CM

## 2023-10-18 DIAGNOSIS — E1142 Type 2 diabetes mellitus with diabetic polyneuropathy: Secondary | ICD-10-CM

## 2023-10-18 DIAGNOSIS — K219 Gastro-esophageal reflux disease without esophagitis: Secondary | ICD-10-CM

## 2023-10-18 DIAGNOSIS — E0500 Thyrotoxicosis with diffuse goiter without thyrotoxic crisis or storm: Secondary | ICD-10-CM

## 2023-10-18 MED ORDER — FUROSEMIDE 80 MG PO TABS
80.0000 mg | ORAL_TABLET | Freq: Two times a day (BID) | ORAL | 1 refills | Status: DC | PRN
  Filled 2023-10-18: qty 200, 100d supply, fill #0
  Filled 2023-10-24: qty 360, 180d supply, fill #0

## 2023-10-19 ENCOUNTER — Ambulatory Visit: Payer: Medicare HMO | Admitting: Internal Medicine

## 2023-10-19 ENCOUNTER — Encounter: Payer: Self-pay | Admitting: Internal Medicine

## 2023-10-19 ENCOUNTER — Other Ambulatory Visit: Payer: Self-pay

## 2023-10-19 ENCOUNTER — Telehealth: Payer: Self-pay

## 2023-10-19 VITALS — BP 120/70 | HR 69 | Ht 74.0 in | Wt 279.8 lb

## 2023-10-19 DIAGNOSIS — E785 Hyperlipidemia, unspecified: Secondary | ICD-10-CM | POA: Diagnosis not present

## 2023-10-19 DIAGNOSIS — E1142 Type 2 diabetes mellitus with diabetic polyneuropathy: Secondary | ICD-10-CM | POA: Diagnosis not present

## 2023-10-19 DIAGNOSIS — E0500 Thyrotoxicosis with diffuse goiter without thyrotoxic crisis or storm: Secondary | ICD-10-CM | POA: Diagnosis not present

## 2023-10-19 DIAGNOSIS — Z7984 Long term (current) use of oral hypoglycemic drugs: Secondary | ICD-10-CM | POA: Diagnosis not present

## 2023-10-19 LAB — POCT GLYCOSYLATED HEMOGLOBIN (HGB A1C): Hemoglobin A1C: 6.6 % — AB (ref 4.0–5.6)

## 2023-10-19 MED ORDER — EMPAGLIFLOZIN 10 MG PO TABS: 10.0000 mg | ORAL_TABLET | Freq: Every day | ORAL | 3 refills | Status: DC

## 2023-10-19 MED ORDER — METFORMIN HCL ER 500 MG PO TB24: 500.0000 mg | ORAL_TABLET | Freq: Every day | ORAL | 3 refills | Status: DC

## 2023-10-19 NOTE — Patient Instructions (Addendum)
Please continue methimazole 5 mg every other day.  Please restart: - Jardiance 10 mg daily   Continue: - Metformin ER 500 mg daily in am  If you cannot use Jardiance, then use Glipizide XL 10 mg before b'fast.  Please return in 3-4 months, but possibly sooner for labs.

## 2023-10-19 NOTE — Telephone Encounter (Signed)
Sample  Medication:Jardiance Dose: 10 mg Quantity:4 boxes (28 pills) WUJ:[id number] EXP:11/2024  Zeanna Sunde,RMA   Provided at office visit
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
## 2023-10-20 ENCOUNTER — Other Ambulatory Visit (HOSPITAL_COMMUNITY): Payer: Self-pay

## 2023-10-20 ENCOUNTER — Encounter: Payer: Self-pay | Admitting: Pharmacist

## 2023-10-20 ENCOUNTER — Other Ambulatory Visit: Payer: Self-pay | Admitting: Adult Health

## 2023-10-20 ENCOUNTER — Other Ambulatory Visit: Payer: Self-pay

## 2023-10-20 DIAGNOSIS — E1142 Type 2 diabetes mellitus with diabetic polyneuropathy: Secondary | ICD-10-CM

## 2023-10-20 MED ORDER — GLIPIZIDE ER 10 MG PO TB24
10.0000 mg | ORAL_TABLET | Freq: Every day | ORAL | 0 refills | Status: DC
  Filled 2023-10-20: qty 90, 90d supply, fill #0

## 2023-10-20 MED ORDER — LIDOCAINE-PRILOCAINE 2.5-2.5 % EX CREA: 1.0000 | TOPICAL_CREAM | CUTANEOUS | 0 refills | Status: DC | PRN

## 2023-10-20 NOTE — Telephone Encounter (Signed)
Okay for refill?   This was never prescribed by you

## 2023-10-21 ENCOUNTER — Other Ambulatory Visit: Payer: Self-pay

## 2023-10-21 MED ORDER — BLOOD GLUC METER DISP-STRIPS DEVI: 0 refills | Status: AC

## 2023-10-21 MED ORDER — FREESTYLE FREEDOM LITE W/DEVICE KIT: PACK | 0 refills | Status: AC

## 2023-10-21 MED ORDER — LANCET DEVICES MISC: 0 refills | Status: AC

## 2023-10-21 MED ORDER — BLOOD GLUCOSE MONITOR KIT: PACK | 0 refills | Status: AC

## 2023-10-21 MED ORDER — FREESTYLE LITE TEST VI STRP: ORAL_STRIP | 12 refills | Status: AC

## 2023-10-21 MED ORDER — FREESTYLE LANCETS MISC: 12 refills | Status: AC

## 2023-10-24 ENCOUNTER — Other Ambulatory Visit: Payer: Self-pay

## 2023-11-04 ENCOUNTER — Other Ambulatory Visit (HOSPITAL_COMMUNITY): Payer: Self-pay

## 2023-12-14 ENCOUNTER — Other Ambulatory Visit: Payer: Self-pay | Admitting: Adult Health

## 2023-12-19 ENCOUNTER — Ambulatory Visit (INDEPENDENT_AMBULATORY_CARE_PROVIDER_SITE_OTHER): Payer: Medicare (Managed Care)

## 2023-12-19 VITALS — Ht 74.0 in | Wt 284.0 lb

## 2023-12-19 DIAGNOSIS — Z0000 Encounter for general adult medical examination without abnormal findings: Secondary | ICD-10-CM | POA: Diagnosis not present

## 2023-12-19 DIAGNOSIS — Z1211 Encounter for screening for malignant neoplasm of colon: Secondary | ICD-10-CM

## 2023-12-19 NOTE — Progress Notes (Signed)
 Subjective:   Jeffrey Price is a 68 y.o. male who presents for Medicare Annual/Subsequent preventive examination.  Visit Complete: Virtual I connected with  Jeffrey Price on 12/19/23 by a audio enabled telemedicine application and verified that I am speaking with the correct person using two identifiers.  Patient Location: Home  Provider Location: Home Office  I discussed the limitations of evaluation and management by telemedicine. The patient expressed understanding and agreed to proceed.  Vital Signs: Because this visit was a virtual/telehealth visit, some criteria may be missing or patient reported. Any vitals not documented were not able to be obtained and vitals that have been documented are patient reported.  Patient Medicare AWV questionnaire was completed by the patient on 12/19/23; I have confirmed that all information answered by patient is correct and no changes since this date.  Cardiac Risk Factors include: advanced age (>90men, >31 women);male gender;diabetes mellitus;dyslipidemia     Objective:    Today's Vitals   12/19/23 1002  Weight: 284 lb (128.8 kg)  Height: 6\' 2"  (1.88 m)   Body mass index is 36.46 kg/m.     12/19/2023   10:10 AM 12/08/2022    2:18 PM 10/18/2022    7:52 AM 09/10/2022   10:28 AM 12/22/2021   12:19 PM 11/30/2021   11:19 AM 06/15/2021    2:30 PM  Advanced Directives  Does Patient Have a Medical Advance Directive? Yes Yes No Yes No No No  Type of Estate agent of [place];Living will Healthcare Power of [place];Living will  Healthcare Power of [place];Living will     Copy of Healthcare Power of Attorney in Chart? No - copy requested No - copy requested  No - copy requested     Would patient like information on creating a medical advance directive?     No - Patient declined  No - Patient declined    Current Medications (verified) Outpatient Encounter Medications as of 12/19/2023  Medication Sig   acetaminophen   (TYLENOL ) 325 MG tablet Take 2 tablets (650 mg total) by mouth every 6 (six) hours as needed for mild pain (or Fever >/= 101).   apixaban  (ELIQUIS ) 5 MG TABS tablet Take 1 tablet (5 mg total) by mouth 2 (two) times daily.   atenolol  (TENORMIN ) 50 MG tablet TAKE 1 TABLET BY MOUTH IN THE MORNING AND 1/2 (ONE-HALF) IN THE EVENING   atorvastatin  (LIPITOR) 20 MG tablet Take 1 tablet by mouth once daily   Blood Gluc Meter Disp-Strips (BLOOD GLUCOSE METER DISPOSABLE) DEVI Use to check blood glucose TID   blood glucose meter kit and supplies KIT Dispense based on patient and insurance preference. Use up to four times daily as directed.   Blood Glucose Monitoring Suppl (FREESTYLE FREEDOM LITE) w/Device KIT Use to check blood sugar 3 times a day   cetirizine (ZYRTEC) 10 MG tablet Take 10 mg by mouth daily as needed for allergies.   clotrimazole  (CLOTRIMAZOLE  AF) 1 % cream Apply topically twice daily as needed (rash).   clotrimazole -betamethasone  (LOTRISONE ) cream Apply 1 Application topically 2 (two) times daily.   Cyanocobalamin  (B-12 SL) Place 1 tablet under the tongue daily.   cyclobenzaprine  (FLEXERIL ) 10 MG tablet TAKE 1 TABLET BY MOUTH TWICE DAILY AS NEEDED FOR MUSCLE SPASM   empagliflozin  (JARDIANCE ) 10 MG TABS tablet Take 1 tablet (10 mg total) by mouth daily before breakfast.   fenofibrate  (TRICOR ) 145 MG tablet Take 1 tablet by mouth once daily   folic acid  (FOLVITE ) 1 MG  tablet Take 1 tablet (1 mg total) by mouth daily.   furosemide  (LASIX ) 80 MG tablet Take 1 tablet by mouth 2 times daily as needed.   gabapentin  (NEURONTIN ) 800 MG tablet TAKE 1 TABLET BY MOUTH THREE TIMES DAILY   glipiZIDE  (GLUCOTROL  XL) 10 MG 24 hr tablet Take 1 tablet (10 mg total) by mouth daily with breakfast.   glucose blood (FREESTYLE LITE) test strip Use to check blood sugar 3 times a day   Lancet Devices MISC Use to check blood glucose TID   Lancets (FREESTYLE) lancets Use to check blood sugar 3 times a day    lidocaine -prilocaine  (EMLA ) cream Apply 1 Application topically as needed (apply to both feet  daily).   metFORMIN  (GLUCOPHAGE -XR) 500 MG 24 hr tablet Take 1 tablet (500 mg) by mouth daily with breakfast.   methimazole  (TAPAZOLE ) 5 MG tablet TAKE 1 TABLET BY MOUTH IN THE MORNING   pantoprazole  (PROTONIX ) 40 MG tablet TAKE 1 TABLET BY MOUTH ONCE DAILY . APPOINTMENT REQUIRED FOR FUTURE REFILLS   sildenafil  (VIAGRA ) 100 MG tablet Take 1/2-1 tablet by mouth daily as needed for erectile dysfunction.   tamsulosin (FLOMAX) 0.4 MG CAPS capsule Take 0.4 mg by mouth daily.   tazarotene (AVAGE) 0.1 % cream    tretinoin  (RETIN-A ) 0.05 % cream Apply 1 application. topically daily as needed (oil skin).   triamcinolone  cream (KENALOG ) 0.1 % Apply to affected area(s) twice daily for 14 days (Patient taking differently: 1 Application 2 (two) times daily as needed (irritation).)   No facility-administered encounter medications on file as of 12/19/2023.    Allergies (verified) Patient has no known allergies.   History: Past Medical History:  Diagnosis Date   Arthritis    in back   Atrial fibrillation (HCC)    Blood clot in vein 1995   bil legs   Bulging lumbar disc    Cataract    mild   Cellulitis 2012   forehead   Complication of anesthesia    had trouble waking up 06/11/2020   DDD (degenerative disc disease), lumbar    Diabetes mellitus without complication (HCC)    GERD (gastroesophageal reflux disease)    Graves disease    Hx of adenomatous colonic polyps 12/15/2017   Hyperlipidemia    Hypertension    Neuromuscular disorder (HCC)    neuropathy in feet   OSA on CPAP    Thyrotoxicosis without thyroid  storm    Varicose veins of both lower extremities    Past Surgical History:  Procedure Laterality Date   ANTERIOR CERVICAL DECOMP/DISCECTOMY FUSION N/A 06/18/2021   Procedure: ANTERIOR CERVICAL DECOMPRESSION FUSION CERVICAL4- CERVICAL 5 WITH INSTRUEMENTATION AND ALLOGRAFT;  Surgeon: Virl Grimes, MD;  Location: [facility] OR;  Service: Orthopedics;  Laterality: N/A;   ATRIAL FIBRILLATION ABLATION N/A 09/14/2022   Procedure: ATRIAL FIBRILLATION ABLATION;  Surgeon: Lei Pump, MD;  Location: [facility] INVASIVE CV LAB;  Service: Cardiovascular;  Laterality: N/A;   BACK SURGERY  06/11/2020   BUBBLE STUDY  09/10/2022   Procedure: BUBBLE STUDY;  Surgeon: Jann Melody, MD;  Location: [facility] ENDOSCOPY;  Service: Cardiovascular;;   CARDIOVERSION N/A 12/22/2021   Procedure: CARDIOVERSION;  Surgeon: Loyde Rule, MD;  Location: [facility] ENDOSCOPY;  Service: Cardiovascular;  Laterality: N/A;   CARDIOVERSION N/A 10/18/2022   Procedure: CARDIOVERSION;  Surgeon: Euell Herrlich, MD;  Location: [facility] ENDOSCOPY;  Service: Cardiovascular;  Laterality: N/A;   ENDOVENOUS ABLATION SAPHENOUS VEIN W/ LASER Left 10/18/2019   endovenous laser ablation left  greater saphenous vein and stab phlebectomy > 20 incisions left leg by Kirtland Perfect MD    ENDOVENOUS ABLATION SAPHENOUS VEIN W/ LASER Right 11/08/2019   endovenous laser ablation right greater saphenous vein and stab phlebectomy 10-20 incisions right leg by Kirtland Perfect MD    LUMBAR EPIDURAL INJECTION     TEE WITHOUT CARDIOVERSION N/A 12/22/2021   Procedure: TRANSESOPHAGEAL ECHOCARDIOGRAM (TEE);  Surgeon: Loyde Rule, MD;  Location: [facility] ENDOSCOPY;  Service: Cardiovascular;  Laterality: N/A;   TEE WITHOUT CARDIOVERSION N/A 09/10/2022   Procedure: TRANSESOPHAGEAL ECHOCARDIOGRAM (TEE);  Surgeon: Jann Melody, MD;  Location[facility] END[facility]COPY;  Service: Cardiovascular;  Laterality: N/A;   Family History  Problem Relation Age of Onset   Thyroid  disease Mother    Parkinson's disease Father    Colon cancer Neg Hx    Rectal cancer Neg Hx    Stomach cancer Neg Hx    Esophageal cancer Neg Hx    Social History   Socioeconomic History   Marital status: Significant Other    Spouse name: Not on file   Number of children: 1   Years of education: Not  on file   Highest education level: 12th grade  Occupational History   Occupation: Retiring  Tobacco Use   Smoking status: Former    Current packs/day: 0.00    Types: E-cigarettes, Cigarettes    Quit date: 12/03/2011    Years since quitting: 12.0   Smokeless tobacco: Never   Tobacco comments:    Former smoker 10/12/22  Vaping Use   Vaping status: Every Day   Substances: Nicotine , Flavoring  Substance and Sexual Activity   Alcohol use: Yes    Comment: whiskey, [place], as few drinks per night and more on weekends 10/12/22   Drug use: No   Sexual activity: Not on file  Other Topics Concern   Not on file  Social History Narrative   Patient is widowed.    He has girlfriend.   Right-handed.   Caffeine use: 2-3 cups per day.      Social Drivers of Corporate investment banker Strain: Low Risk  (12/19/2023)   Overall Financial Resource Strain (CARDIA)    Difficulty of Paying Living Expenses: Not hard at all  Food Insecurity: No Food Insecurity (12/19/2023)   Hunger Vital Sign    Worried About Running Out of Food in the Last Year: Never true    Ran Out of Food in the Last Year: Never true  Transportation Needs: No Transportation Needs (12/19/2023)   PRAPARE - Administrator, Civil Service (Medical): No    Lack of Transportation (Non-Medical): No  Physical Activity: Sufficiently Active (12/19/2023)   Exercise Vital Sign    Days of Exercise per Week: 5 days    Minutes of Exercise per Session: 30 min  Stress: No Stress Concern Present (12/19/2023)   [employer] of Occupational Health - Occupational Stress Questionnaire    Feeling of Stress : Not at all  Social Connections: Moderately Integrated (12/19/2023)   Social Connection and Isolation Panel [NHANES]    Frequency of Communication with Friends and Family: More than three times a week    Frequency of Social Gatherings with Friends and Family: More than three times a week    Attends Religious Services: More than 4  times per year    Active Member of [employer] or Organizations: Yes    Attends Banker Meetings: More than 4 times per year    Marital  Status: Widowed  Recent Concern: Social Connections - Socially Isolated (10/12/2023)   Social Connection and Isolation Panel [NHANES]    Frequency of Communication with Friends and Family: Three times a week    Frequency of Social Gatherings with Friends and Family: Once a week    Attends Religious Services: Never    Database administrator or Organizations: No    Attends Engineer, structural: Not on file    Marital Status: Widowed    Tobacco Counseling Counseling given: Not Answered Tobacco comments: Former smoker 10/12/22   Clinical Intake:  Pre-visit preparation completed: Yes  Pain : No/denies pain     BMI - recorded: 36.46 Nutritional Status: BMI > 30  Obese Nutritional Risks: None Diabetes: Yes CBG done?: Yes CBG resulted in Enter/ Edit results?: Yes (CBG 169 Per patient) Did pt. bring in CBG monitor from home?: No  How often do you need to have someone help you when you read instructions, pamphlets, or other written materials from your doctor or pharmacy?: 1 - Never  Interpreter Needed?: No  Information entered by :: Farris Hong LPN   Activities of Daily Living    12/19/2023   10:09 AM 12/19/2023    9:05 AM  In your present state of health, do you have any difficulty performing the following activities:  Hearing? 0 0  Vision? 0 0  Difficulty concentrating or making decisions? 0 0  Walking or climbing stairs? 0 0  Dressing or bathing? 0 0  Doing errands, shopping? 0 0  Preparing Food and eating ? N N  Using the Toilet? N N  In the past six months, have you accidently leaked urine? N N  Do you have problems with loss of bowel control? N N  Managing your Medications? N N  Managing your Finances? N N  Housekeeping or managing your Housekeeping? N N    Patient Care Team: Alto Atta, NP as PCP -  General (Family Medicine) Loyde Rule, MD as PCP - Cardiology (Cardiology) Lei Pump, MD as PCP - Electrophysiology (Cardiology) [facility], [place]  Indicate any recent Medical Services you may have received from other than [facility] providers in the past year (date may be approximate).     Assessment:   This is a routine wellness examination for [place].  Hearing/Vision screen Hearing Screening - Comments:: Denies hearing difficulties   Vision Screening - Comments:: Wears rx glasses - up to date with routine eye exams with  [facility]   Goals Addressed               This Visit's Progress     Increase physical activity (pt-stated)         Depression Screen    12/19/2023   10:09 AM 10/14/2023   10:11 AM 12/08/2022    2:13 PM 01/06/2022   10:47 AM 12/07/2021   12:20 PM 10/09/2021   12:36 PM 12/29/2018    3:33 PM  PHQ 2/9 Scores  PHQ - 2 Score 0 0 0 0 3 1 0  PHQ- 9 Score  1  2 14       Fall Risk    12/19/2023   10:09 AM 12/19/2023    9:05 AM 10/14/2023   10:11 AM 05/23/2023    8:41 PM 12/08/2022    2:17 PM  Fall Risk   Falls in the past year? 0 0 0 0 0  Number falls in past yr: 0 0 0  0  Injury with Fall? 0 0 0  0  Risk for fall due to : No Fall Risks  No Fall Risks  No Fall Risks  Follow up Falls prevention discussed;Falls evaluation completed  Falls evaluation completed  Falls prevention discussed    MEDICARE RISK AT HOME: Medicare Risk at Home Any stairs in or around the home?: No If so, are there any without handrails?: No Home free of loose throw rugs in walkways, pet beds, electrical cords, etc?: Yes Adequate lighting in your home to reduce risk of falls?: Yes Life alert?: No Use of a cane, walker or w/c?: No Grab bars in the bathroom?: No Shower chair or bench in shower?: No Elevated toilet seat or a handicapped toilet?: Yes  TIMED UP AND GO:  Was the test performed?  No    Cognitive Function:        12/19/2023   10:10 AM  12/08/2022    2:18 PM  6CIT Screen  What Year? 0 points 0 points  What month? 0 points 0 points  What time? 0 points 0 points  Count back from 20 0 points 0 points  Months in reverse 0 points 0 points  Repeat phrase 0 points 0 points  Total Score 0 points 0 points    Immunizations Immunization History  Administered Date(s) Administered   Fluad Quad(high Dose 65+) 07/23/2022   Influenza,inj,Quad PF,6+ Mos 09/29/2018, 07/27/2019, 08/08/2020, 10/09/2021   Influenza-Unspecified 08/13/2017, 12/23/2021, 07/25/2023   PFIZER(Purple Top)SARS-COV-2 Vaccination 01/26/2020, 02/16/2020, 09/06/2020, 01/13/2022   PNEUMOCOCCAL CONJUGATE-20 10/09/2021   RSV,unspecified 12/07/2022   Tdap 03/31/2018   Zoster Recombinant(Shingrix) 01/13/2022, 03/12/2022    TDAP status: Up to date  Flu Vaccine status: Up to date  Pneumococcal vaccine status: Up to date  Covid-19 vaccine status: Declined, Education has been provided regarding the importance of this vaccine but patient still declined. Advised may receive this vaccine at local pharmacy or Health Dept.or vaccine clinic. Aware to provide a copy of the vaccination record if obtained from local pharmacy or Health Dept. Verbalized acceptance and understanding.  Qualifies for Shingles Vaccine? Yes   Zostavax completed Yes   Shingrix Completed?: Yes  Screening Tests Health Maintenance  Topic Date Due   Colonoscopy  12/09/2020   COVID-19 Vaccine (5 - 2024-25 season) 04/13/2024 (Originally 07/10/2023)   HEMOGLOBIN A1C  04/18/2024   OPHTHALMOLOGY EXAM  09/25/2024   Diabetic kidney evaluation - eGFR measurement  10/13/2024   Diabetic kidney evaluation - Urine ACR  10/13/2024   FOOT EXAM  10/18/2024   Medicare Annual Wellness (AWV)  12/18/2024   DTaP/Tdap/Td (2 - Td or Tdap) 03/31/2028   Pneumonia Vaccine 80+ Years old  Completed   INFLUENZA VACCINE  Completed   Hepatitis C Screening  Completed   Zoster Vaccines- Shingrix  Completed   HPV VACCINES   Aged Out    Health Maintenance  Health Maintenance Due  Topic Date Due   Colonoscopy  12/09/2020    Colorectal cancer screening: Referral to GI placed 12/19/23. Pt aware the office will call re: appt.    Additional Screening:  Hepatitis C Screening: does qualify; Completed 03/31/18  Vision Screening: Recommended annual ophthalmology exams for early detection of glaucoma and other disorders of the eye. Is the patient up to date with their annual eye exam?  Yes  Who is the provider or what is the name of the office in which the patient attends annual eye exams? [facility] If pt is not established with a provider, would they  like to be referred to a provider to establish care? No .   Dental Screening: Recommended annual dental exams for proper oral hygiene  Diabetic Foot Exam: Diabetic Foot Exam: Completed 10/19/23  Community Resource Referral / Chronic Care Management:  CRR required this visit?  No   CCM required this visit?  No     Plan:     I have personally reviewed and noted the following in the patient's chart:   Medical and social history Use of alcohol, tobacco or illicit drugs  Current medications and supplements including opioid prescriptions. Patient is not currently taking opioid prescriptions. Functional ability and status Nutritional status Physical activity Advanced directives List of other physicians Hospitalizations, surgeries, and ER visits in previous 12 months Vitals Screenings to include cognitive, depression, and falls Referrals and appointments  In addition, I have reviewed and discussed with patient certain preventive protocols, quality metrics, and best practice recommendations. A written personalized care plan for preventive services as well as general preventive health recommendations were provided to patient.     Dewayne Ford, LPN   1/61/0960   After Visit Summary: (MyChart) Due to this being a telephonic visit, the after visit  summary with patients personalized plan was offered to patient via MyChart   Nurse Notes: None

## 2023-12-19 NOTE — Patient Instructions (Addendum)
 [name] , Thank you for taking time to come for your Medicare Wellness Visit. I appreciate your ongoing commitment to your health goals. Please review the following plan we discussed and let me know if I can assist you in the future.   Referrals/Orders/Follow-Ups/Clinician Recommendations:   This is a list of the screening recommended for you and due dates:  Health Maintenance  Topic Date Due   Colon Cancer Screening  12/09/2020   COVID-19 Vaccine (5 - 2024-25 season) 04/13/2024*   Hemoglobin A1C  04/18/2024   Eye exam for diabetics  09/25/2024   Yearly kidney function blood test for diabetes  10/13/2024   Yearly kidney health urinalysis for diabetes  10/13/2024   Complete foot exam   10/18/2024   Medicare Annual Wellness Visit  12/18/2024   DTaP/Tdap/Td vaccine (2 - Td or Tdap) 03/31/2028   Pneumonia Vaccine  Completed   Flu Shot  Completed   Hepatitis C Screening  Completed   Zoster (Shingles) Vaccine  Completed   HPV Vaccine  Aged Out  *Topic was postponed. The date shown is not the original due date.    Advanced directives: (Copy Requested) Please bring a copy of your health care power of attorney and living will to the office to be added to your chart at your convenience.  Next Medicare Annual Wellness Visit scheduled for next year: Yes

## 2024-01-09 ENCOUNTER — Other Ambulatory Visit (HOSPITAL_BASED_OUTPATIENT_CLINIC_OR_DEPARTMENT_OTHER): Payer: Self-pay | Admitting: Cardiovascular Disease

## 2024-01-09 DIAGNOSIS — E785 Hyperlipidemia, unspecified: Secondary | ICD-10-CM

## 2024-01-21 ENCOUNTER — Other Ambulatory Visit: Payer: Self-pay | Admitting: Adult Health

## 2024-01-21 DIAGNOSIS — E0500 Thyrotoxicosis with diffuse goiter without thyrotoxic crisis or storm: Secondary | ICD-10-CM

## 2024-01-24 ENCOUNTER — Encounter: Payer: Self-pay | Admitting: Adult Health

## 2024-01-24 ENCOUNTER — Other Ambulatory Visit: Payer: Self-pay | Admitting: Adult Health

## 2024-01-24 DIAGNOSIS — E785 Hyperlipidemia, unspecified: Secondary | ICD-10-CM

## 2024-01-24 NOTE — Telephone Encounter (Signed)
 Okay for refill?

## 2024-01-25 MED ORDER — FENOFIBRATE 145 MG PO TABS: 145.0000 mg | ORAL_TABLET | Freq: Every day | ORAL | 0 refills | Status: DC

## 2024-01-27 ENCOUNTER — Ambulatory Visit: Payer: Medicare (Managed Care)

## 2024-01-27 ENCOUNTER — Ambulatory Visit (INDEPENDENT_AMBULATORY_CARE_PROVIDER_SITE_OTHER): Payer: Medicare (Managed Care) | Admitting: Adult Health

## 2024-01-27 VITALS — BP 130/80 | HR 68 | Temp 97.9°F | Ht 74.0 in | Wt 264.0 lb

## 2024-01-27 DIAGNOSIS — J988 Other specified respiratory disorders: Secondary | ICD-10-CM | POA: Diagnosis not present

## 2024-01-27 LAB — CBC
HCT: 44.3 % (ref 39.0–52.0)
Hemoglobin: 14.9 g/dL (ref 13.0–17.0)
MCHC: 33.7 g/dL (ref 30.0–36.0)
MCV: 89.1 fl (ref 78.0–100.0)
Platelets: 206 10*3/uL (ref 150.0–400.0)
RBC: 4.97 Mil/uL (ref 4.22–5.81)
RDW: 14.1 % (ref 11.5–15.5)
WBC: 5.4 10*3/uL (ref 4.0–10.5)

## 2024-01-27 LAB — BASIC METABOLIC PANEL
BUN: 15 mg/dL (ref 6–23)
CO2: 29 meq/L (ref 19–32)
Calcium: 9.1 mg/dL (ref 8.4–10.5)
Chloride: 96 meq/L (ref 96–112)
Creatinine, Ser: 1.02 mg/dL (ref 0.40–1.50)
GFR: 76.22 mL/min (ref >60.00)
Glucose, Bld: 138 mg/dL — ABNORMAL HIGH (ref 70–99)
Potassium: 3.7 meq/L (ref 3.5–5.1)
Sodium: 135 meq/L (ref 135–145)

## 2024-01-27 MED ORDER — HYDROCODONE BIT-HOMATROP MBR 5-1.5 MG/5ML PO SOLN
5.0000 mL | Freq: Three times a day (TID) | ORAL | 0 refills | Status: DC | PRN
Start: 2024-01-27 — End: 2024-05-23

## 2024-01-27 MED ORDER — ALBUTEROL SULFATE HFA 108 (90 BASE) MCG/ACT IN AERS: 2.0000 | INHALATION_SPRAY | Freq: Four times a day (QID) | RESPIRATORY_TRACT | 0 refills | Status: DC | PRN

## 2024-01-27 MED ORDER — DOXYCYCLINE HYCLATE 100 MG PO CAPS: 100.0000 mg | ORAL_CAPSULE | Freq: Two times a day (BID) | ORAL | 0 refills | Status: DC

## 2024-01-27 NOTE — Patient Instructions (Signed)
 I am going to check some blood work and a chest xray on you   I have sent in an antibiotic called Doxycycline and some cough medication for you

## 2024-01-27 NOTE — Progress Notes (Signed)
 Subjective:    Patient ID: Jeffrey Price, male    DOB: 09-Jul-1956, 68 y.o.   MRN: [id number]  HPI 68 year old male who  has a past medical history of Arthritis, Atrial fibrillation (HCC), Blood clot in vein (1995), Bulging lumbar disc, Cataract, Cellulitis (2012), Complication of anesthesia, DDD (degenerative disc disease), lumbar, Diabetes mellitus without complication (HCC), GERD (gastroesophageal reflux disease), Graves disease, adenomatous colonic polyps (12/15/2017), Hyperlipidemia, Hypertension, Neuromuscular disorder (HCC), OSA on CPAP, Thyrotoxicosis without thyroid storm, and Varicose veins of both lower extremities.  He presents to the office today for an acute issue. He reports that for the last two weeks he has been experiencing fatigue, a semi productive cough with clear sputum, body aches, fatigue, chest congestion, ? Wheezing, shortness of breath, decreased appetite, and intermittent fevers up to 100. His partner is sick with similar symptoms. Reports that they have been " passing something back and forth for the last two months".   At home he has been using Dayquil/Nyquil and sudafed with some mild relief.   He has not had any chills, n/v/d.   Wt Readings from Last 3 Encounters:  01/27/24 264 lb (119.7 kg)  12/19/23 284 lb (128.8 kg)  10/19/23 279 lb 12.8 oz (126.9 kg)     Review of Systems See HPI   Past Medical History:  Diagnosis Date   Arthritis    in back   Atrial fibrillation (HCC)    Blood clot in vein 1995   bil legs   Bulging lumbar disc    Cataract    mild   Cellulitis 2012   forehead   Complication of anesthesia    had trouble waking up 06/11/2020   DDD (degenerative disc disease), lumbar    Diabetes mellitus without complication (HCC)    GERD (gastroesophageal reflux disease)    Graves disease    Hx of adenomatous colonic polyps 12/15/2017   Hyperlipidemia    Hypertension    Neuromuscular disorder (HCC)    neuropathy in feet   OSA on CPAP     Thyrotoxicosis without thyroid storm    Varicose veins of both lower extremities     Social History   Socioeconomic History   Marital status: Significant Other    Spouse name: Not on file   Number of children: 1   Years of education: Not on file   Highest education level: 12th grade  Occupational History   Occupation: Retiring  Tobacco Use   Smoking status: Former    Current packs/day: 0.00    Types: E-cigarettes, Cigarettes    Quit date: 12/03/2011    Years since quitting: 12.1   Smokeless tobacco: Never   Tobacco comments:    Former smoker 10/12/22  Vaping Use   Vaping status: Every Day   Substances: Nicotine, Flavoring  Substance and Sexual Activity   Alcohol use: Yes    Comment: whiskey, Burbon, as few drinks per night and more on weekends 10/12/22   Drug use: No   Sexual activity: Not on file  Other Topics Concern   Not on file  Social History Narrative   Patient is widowed.    He has girlfriend.   Right-handed.   Caffeine use: 2-3 cups per day.      Social Drivers of Corporate investment banker Strain: Low Risk  (01/26/2024)   Overall Financial Resource Strain (CARDIA)    Difficulty of Paying Living Expenses: Not hard at all  Food Insecurity: No Food Insecurity (01/26/2024)  Hunger Vital Sign    Worried About Running Out of Food in the Last Year: Never true    Ran Out of Food in the Last Year: Never true  Transportation Needs: No Transportation Needs (01/26/2024)   PRAPARE - Administrator, Civil Service (Medical): No    Lack of Transportation (Non-Medical): No  Physical Activity: Insufficiently Active (01/26/2024)   Exercise Vital Sign    Days of Exercise per Week: 3 days    Minutes of Exercise per Session: 20 min  Stress: No Stress Concern Present (01/26/2024)   [employer] of Occupational Health - Occupational Stress Questionnaire    Feeling of Stress : Not at all  Social Connections: Socially Integrated (01/26/2024)   Social Connection  and Isolation Panel [NHANES]    Frequency of Communication with Friends and Family: More than three times a week    Frequency of Social Gatherings with Friends and Family: Twice a week    Attends Religious Services: 1 to 4 times per year    Active Member of [employer] or Organizations: Yes    Attends Banker Meetings: 1 to 4 times per year    Marital Status: Living with partner  Intimate Partner Violence: Not At Risk (12/19/2023)   Humiliation, Afraid, Rape, and Kick questionnaire    Fear of Current or Ex-Partner: No    Emotionally Abused: No    Physically Abused: No    Sexually Abused: No    Past Surgical History:  Procedure Laterality Date   ANTERIOR CERVICAL DECOMP/DISCECTOMY FUSION N/A 06/18/2021   Procedure: ANTERIOR CERVICAL DECOMPRESSION FUSION CERVICAL4- CERVICAL 5 WITH INSTRUEMENTATION AND ALLOGRAFT;  Surgeon: Estill Bamberg, MD;  Location: [facility] OR;  Service: Orthopedics;  Laterality: N/A;   ATRIAL FIBRILLATION ABLATION N/A 09/14/2022   Procedure: ATRIAL FIBRILLATION ABLATION;  Surgeon: Regan Lemming, MD;  Location: [facility] INVASIVE CV LAB;  Service: Cardiovascular;  Laterality: N/A;   BACK SURGERY  06/11/2020   BUBBLE STUDY  09/10/2022   Procedure: BUBBLE STUDY;  Surgeon: Christell Constant, MD;  Location: [facility] ENDOSCOPY;  Service: Cardiovascular;;   CARDIOVERSION N/A 12/22/2021   Procedure: CARDIOVERSION;  Surgeon: Wendall Stade, MD;  Location: [facility] ENDOSCOPY;  Service: Cardiovascular;  Laterality: N/A;   CARDIOVERSION N/A 10/18/2022   Procedure: CARDIOVERSION;  Surgeon: Parke Poisson, MD;  Location[facility] ENDOSCOPY;  Service: Cardiovascular;  Laterality: N/A;   ENDOVENOUS ABLATION SAPHENOUS VEIN W/ LASER Left 10/18/2019   endovenous laser ablation left greater saphenous vein and stab phlebectomy > 20 incisions left leg by Cari Caraway MD    ENDOVENOUS ABLATION SAPHENOUS VEIN W/ LASER Right 11/08/2019   endovenous laser ablation right greater saphenous vein and stab  phlebectomy 10-20 incisions right leg by Cari Caraway MD    LUMBAR EPIDURAL INJECTION     TEE WITHOUT CARDIOVERSION N/A 12/22/2021   Procedure: TRANSESOPHAGEAL ECHOCARDIOGRAM (TEE);  Surgeon: Wendall Stade, MD;  Location: [facility] ENDOSCOPY;  Service: Cardiovascular;  Laterality: N/A;   TEE WITHOUT CARDIOVERSION N/A 09/10/2022   Procedure: TRANSESOPHAGEAL ECHOCARDIOGRAM (TEE);  Surgeon: Christell Constant, MD;  Location: [facility] ENDOSCOPY;  Service: Cardiovascular;  Laterality: N/A;    Family History  Problem Relation Age of Onset   Thyroid disease Mother    Parkinson's disease Father    Colon cancer Neg Hx    Rectal cancer Neg Hx    Stomach cancer Neg Hx    Esophageal cancer Neg Hx     No Known Allergies  Current Outpatient Medications on File  Prior to Visit  Medication Sig Dispense Refill   acetaminophen (TYLENOL) 325 MG tablet Take 2 tablets (650 mg total) by mouth every 6 (six) hours as needed for mild pain (or Fever >/= 101).     apixaban (ELIQUIS) 5 MG TABS tablet Take 1 tablet (5 mg total) by mouth 2 (two) times daily. 60 tablet 6   atenolol (TENORMIN) 50 MG tablet TAKE 1 TABLET BY MOUTH IN THE MORNING AND 1/2 (ONE-HALF) IN THE EVENING 90 tablet 0   atorvastatin (LIPITOR) 20 MG tablet Take 1 tablet (20 mg total) by mouth daily. Please call [phone] to schedule an overdue appointment for future refills. Thank you. 2nd attempt. 15 tablet 0   Blood Gluc Meter Disp-Strips (BLOOD GLUCOSE METER DISPOSABLE) DEVI Use to check blood glucose TID 100 each 0   blood glucose meter kit and supplies KIT Dispense based on patient and insurance preference. Use up to four times daily as directed. 1 each 0   Blood Glucose Monitoring Suppl (FREESTYLE FREEDOM LITE) w/Device KIT Use to check blood sugar 3 times a day 1 kit 0   cetirizine (ZYRTEC) 10 MG tablet Take 10 mg by mouth daily as needed for allergies.     clotrimazole (CLOTRIMAZOLE AF) 1 % cream Apply topically twice daily as needed (rash). 45  g 2   clotrimazole-betamethasone (LOTRISONE) cream Apply 1 Application topically 2 (two) times daily. 30 g 1   Cyanocobalamin (B-12 SL) Place 1 tablet under the tongue daily.     cyclobenzaprine (FLEXERIL) 10 MG tablet TAKE 1 TABLET BY MOUTH TWICE DAILY AS NEEDED FOR MUSCLE SPASM 90 tablet 0   empagliflozin (JARDIANCE) 10 MG TABS tablet Take 1 tablet (10 mg total) by mouth daily before breakfast. 90 tablet 3   fenofibrate (TRICOR) 145 MG tablet Take 1 tablet (145 mg total) by mouth daily. 90 tablet 0   folic acid (FOLVITE) 1 MG tablet Take 1 tablet (1 mg total) by mouth daily.     furosemide (LASIX) 80 MG tablet Take 1 tablet by mouth 2 times daily as needed. 360 tablet 1   gabapentin (NEURONTIN) 800 MG tablet TAKE 1 TABLET BY MOUTH THREE TIMES DAILY 270 tablet 0   glipiZIDE (GLUCOTROL XL) 10 MG 24 hr tablet Take 1 tablet (10 mg total) by mouth daily with breakfast. 90 tablet 0   glucose blood (FREESTYLE LITE) test strip Use to check blood sugar 3 times a day 300 each 12   Lancet Devices MISC Use to check blood glucose TID 100 each 0   Lancets (FREESTYLE) lancets Use to check blood sugar 3 times a day 300 each 12   lidocaine-prilocaine (EMLA) cream Apply 1 Application topically as needed (apply to both feet  daily). 30 g 0   metFORMIN (GLUCOPHAGE-XR) 500 MG 24 hr tablet Take 1 tablet (500 mg) by mouth daily with breakfast. 90 tablet 3   methimazole (TAPAZOLE) 5 MG tablet TAKE 1 TABLET BY MOUTH IN THE MORNING 30 tablet 0   pantoprazole (PROTONIX) 40 MG tablet TAKE 1 TABLET BY MOUTH ONCE DAILY . APPOINTMENT REQUIRED FOR FUTURE REFILLS 90 tablet 0   sildenafil (VIAGRA) 100 MG tablet Take 1/2-1 tablet by mouth daily as needed for erectile dysfunction. 30 tablet 2   tamsulosin (FLOMAX) 0.4 MG CAPS capsule Take 0.4 mg by mouth daily.     tazarotene (AVAGE) 0.1 % cream      tretinoin (RETIN-A) 0.05 % cream Apply 1 application. topically daily as needed (oil skin). 45 g  3   triamcinolone cream (KENALOG)  0.1 % Apply to affected area(s) twice daily for 14 days (Patient taking differently: 1 Application 2 (two) times daily as needed (irritation).) 45 g 0   No current facility-administered medications on file prior to visit.    BP 130/80   Pulse 68   Temp 97.9 F (36.6 C) (Oral)   Ht 6\' 2"  (1.88 m)   Wt 264 lb (119.7 kg)   SpO2 98%   BMI 33.90 kg/m       Objective:   Physical Exam Vitals and nursing note reviewed.  Constitutional:      Appearance: Normal appearance.  Cardiovascular:     Rate and Rhythm: Normal rate and regular rhythm.     Pulses: Normal pulses.     Heart sounds: Normal heart sounds.  Pulmonary:     Breath sounds: Examination of the right-lower field reveals decreased breath sounds. Decreased breath sounds present. No wheezing or rhonchi.  Neurological:     General: No focal deficit present.     Mental Status: He is alert and oriented to person, place, and time.  Psychiatric:        Mood and Affect: Mood normal.        Behavior: Behavior normal.        Thought Content: Thought content normal.        Judgment: Judgment normal.        Assessment & Plan:  1. Respiratory infection (Primary) - Diminished breath sounds in right lower lung field and 20 pounds weight loss over the last month.  Need to r/o PNA. Will start treatment with Doxycyline.  - doxycycline (VIBRAMYCIN) 100 MG capsule; Take 1 capsule (100 mg total) by mouth 2 (two) times daily.  Dispense: 20 capsule; Refill: 0 - HYDROcodone bit-homatropine (HYCODAN) 5-1.5 MG/5ML syrup; Take 5 mLs by mouth every 8 (eight) hours as needed for cough.  Dispense: 120 mL; Refill: 0 - albuterol (VENTOLIN HFA) 108 (90 Base) MCG/ACT inhaler; Inhale 2 puffs into the lungs every 6 (six) hours as needed for wheezing or shortness of breath.  Dispense: 8 g; Refill: 0 - DG Chest 2 View; Future - CBC; Future - Basic Metabolic Panel; Future  Shirline Frees, NP  Time spent with patient today was 32 minutes which consisted  of chart review, discussing respiratory infection, , work up, treatment, listening,  answering questions and documentation.

## 2024-01-31 ENCOUNTER — Encounter: Payer: Self-pay | Admitting: Adult Health

## 2024-02-06 ENCOUNTER — Other Ambulatory Visit: Payer: Self-pay | Admitting: Cardiovascular Disease

## 2024-02-06 ENCOUNTER — Other Ambulatory Visit: Payer: Self-pay | Admitting: Adult Health

## 2024-02-06 DIAGNOSIS — K219 Gastro-esophageal reflux disease without esophagitis: Secondary | ICD-10-CM

## 2024-02-06 NOTE — Telephone Encounter (Signed)
 Prescription refill request for Eliquis received. Indication: PAF Last office visit: 06/17/23  C Fenton PA Scr: 1.02 on 01/27/24  Epic Age: 68 Weight: 119.7kg  Based on above findings Eliquis 5mg  twice daily is the appropriate dose.  Refill approved.

## 2024-02-09 ENCOUNTER — Other Ambulatory Visit: Payer: Self-pay | Admitting: Adult Health

## 2024-02-09 DIAGNOSIS — R634 Abnormal weight loss: Secondary | ICD-10-CM

## 2024-02-17 ENCOUNTER — Encounter: Payer: Self-pay | Admitting: Internal Medicine

## 2024-02-17 ENCOUNTER — Ambulatory Visit: Payer: Medicare (Managed Care) | Admitting: Internal Medicine

## 2024-02-17 VITALS — BP 120/64 | HR 67 | Ht 74.0 in | Wt 270.6 lb

## 2024-02-17 DIAGNOSIS — E785 Hyperlipidemia, unspecified: Secondary | ICD-10-CM | POA: Diagnosis not present

## 2024-02-17 DIAGNOSIS — E1142 Type 2 diabetes mellitus with diabetic polyneuropathy: Secondary | ICD-10-CM | POA: Diagnosis not present

## 2024-02-17 DIAGNOSIS — Z7985 Long-term (current) use of injectable non-insulin antidiabetic drugs: Secondary | ICD-10-CM

## 2024-02-17 DIAGNOSIS — E0500 Thyrotoxicosis with diffuse goiter without thyrotoxic crisis or storm: Secondary | ICD-10-CM

## 2024-02-17 DIAGNOSIS — Z7984 Long term (current) use of oral hypoglycemic drugs: Secondary | ICD-10-CM

## 2024-02-17 LAB — POCT GLYCOSYLATED HEMOGLOBIN (HGB A1C): Hemoglobin A1C: 7 % — AB (ref 4.0–5.6)

## 2024-02-17 MED ORDER — TIRZEPATIDE 2.5 MG/0.5ML ~~LOC~~ SOAJ: 2.5000 mg | SUBCUTANEOUS | 3 refills | Status: DC

## 2024-02-17 NOTE — Addendum Note (Signed)
 Addended by: Pollie Meyer on: 02/17/2024 01:12 PM   Modules accepted: Orders
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
## 2024-02-17 NOTE — Patient Instructions (Addendum)
 Please continue methimazole 5 mg every other day.  Please continue: - Jardiance 25 mg daily  - Metformin ER 500 mg daily in am  Try to start: - Mounjaro 2.5 mg weekly  Let me know if we can increase the dose of Mounjaro.  Please return in 3-4 months, but possibly sooner for labs.

## 2024-03-01 ENCOUNTER — Ambulatory Visit
Admission: RE | Admit: 2024-03-01 | Discharge: 2024-03-01 | Discharge status: Home / Self Care | Payer: Medicare (Managed Care) | Source: Ambulatory Visit | Attending: Adult Health

## 2024-03-01 DIAGNOSIS — K5730 Diverticulosis of large intestine without perforation or abscess without bleeding: Secondary | ICD-10-CM | POA: Diagnosis not present

## 2024-03-01 DIAGNOSIS — K760 Fatty (change of) liver, not elsewhere classified: Secondary | ICD-10-CM | POA: Diagnosis not present

## 2024-03-01 DIAGNOSIS — R634 Abnormal weight loss: Secondary | ICD-10-CM

## 2024-03-01 DIAGNOSIS — R599 Enlarged lymph nodes, unspecified: Secondary | ICD-10-CM | POA: Diagnosis not present

## 2024-03-01 DIAGNOSIS — R918 Other nonspecific abnormal finding of lung field: Secondary | ICD-10-CM | POA: Diagnosis not present

## 2024-03-01 MED ORDER — IOPAMIDOL (ISOVUE-300) INJECTION 61%
80.0000 mL | Freq: Once | INTRAVENOUS | Status: AC | PRN
  Administered 2024-03-01: 80 mL via INTRAVENOUS

## 2024-03-02 ENCOUNTER — Other Ambulatory Visit: Payer: Self-pay | Admitting: Internal Medicine

## 2024-03-02 ENCOUNTER — Other Ambulatory Visit: Payer: Self-pay | Admitting: Adult Health

## 2024-03-02 ENCOUNTER — Encounter: Payer: Self-pay | Admitting: Internal Medicine

## 2024-03-02 MED ORDER — TIRZEPATIDE 5 MG/0.5ML ~~LOC~~ SOAJ: 5.0000 mg | SUBCUTANEOUS | 3 refills | Status: DC

## 2024-03-04 ENCOUNTER — Encounter: Payer: Self-pay | Admitting: Adult Health

## 2024-03-05 ENCOUNTER — Other Ambulatory Visit: Payer: Self-pay | Admitting: Adult Health

## 2024-03-06 ENCOUNTER — Encounter: Payer: Self-pay | Admitting: Cardiovascular Disease

## 2024-03-06 ENCOUNTER — Telehealth: Payer: Self-pay

## 2024-03-06 ENCOUNTER — Other Ambulatory Visit (HOSPITAL_BASED_OUTPATIENT_CLINIC_OR_DEPARTMENT_OTHER): Payer: Self-pay | Admitting: Cardiovascular Disease

## 2024-03-06 ENCOUNTER — Ambulatory Visit (INDEPENDENT_AMBULATORY_CARE_PROVIDER_SITE_OTHER): Payer: Medicare (Managed Care) | Admitting: Adult Health

## 2024-03-06 ENCOUNTER — Encounter: Payer: Self-pay | Admitting: Adult Health

## 2024-03-06 ENCOUNTER — Other Ambulatory Visit (HOSPITAL_COMMUNITY): Payer: Self-pay

## 2024-03-06 VITALS — BP 120/80 | HR 67 | Temp 97.7°F | Ht 74.0 in | Wt 264.0 lb

## 2024-03-06 DIAGNOSIS — R911 Solitary pulmonary nodule: Secondary | ICD-10-CM

## 2024-03-06 DIAGNOSIS — S86892A Other injury of other muscle(s) and tendon(s) at lower leg level, left leg, initial encounter: Secondary | ICD-10-CM | POA: Diagnosis not present

## 2024-03-06 DIAGNOSIS — E785 Hyperlipidemia, unspecified: Secondary | ICD-10-CM

## 2024-03-06 MED ORDER — DIAZEPAM 5 MG PO TABS: 5.0000 mg | ORAL_TABLET | Freq: Two times a day (BID) | ORAL | 0 refills | Status: AC | PRN

## 2024-03-06 NOTE — Telephone Encounter (Signed)
 Updated patient on CT scan results. Will proceed with PET Scan   PRESSION: 1. Spiculated 11 mm anterior left upper lobe pulmonary nodule and solid right upper lobe pulmonary nodules measuring up to 9 mm are indeterminate

## 2024-03-06 NOTE — Telephone Encounter (Signed)
 Pharmacy Patient Advocate Encounter   Received notification from CoverMyMeds that prior authorization for CYCLOBENZAPRINE  10MG  TABS is required/requested.   Insurance verification completed.   The patient is insured through [employer] .   Per test claim: PA required; PA submitted to above mentioned insurance via CoverMyMeds Key/confirmation #/EOC [id number] Status is pending

## 2024-03-06 NOTE — Progress Notes (Signed)
 Subjective:    Patient ID: OVEL GUBA, male    DOB: [birth year], 68 y.o.   MRN: [id number]  Leg Pain    68 year old male who  has a past medical history of Arthritis, Atrial fibrillation (HCC), Blood clot in vein (1995), Bulging lumbar disc, Cataract, Cellulitis (2012), Complication of anesthesia, DDD (degenerative disc disease), lumbar, Diabetes mellitus without complication (HCC), GERD (gastroesophageal reflux disease), Graves disease, adenomatous colonic polyps (12/15/2017), Hyperlipidemia, Hypertension, Neuromuscular disorder (HCC), OSA on CPAP, Thyrotoxicosis without thyroid  storm, and Varicose veins of both lower extremities.  He presents to the office today for an acute issue. He reports that he woke up a few days ago with severe pain in his lower left leg - on the left side next to his shin. He denies trauma to the area.  He has not noticed any redness, warmth, or lower extremity edema.  The day or 2 prior to when his symptoms started he was working in the garden and had been exercising on the treadmill.  Area is tender to touch and with walking.  When he elevates his leg the pain improves.  He has not been using anything over-the-counter for pain relief  He does have a remote history of blood clots 20 years ago and does take Eliquis  5 mg BID for history of Afib.    Review of Systems See HPI   Past Medical History:  Diagnosis Date   Arthritis    in back   Atrial fibrillation (HCC)    Blood clot in vein 1995   bil legs   Bulging lumbar disc    Cataract    mild   Cellulitis 2012   forehead   Complication of anesthesia    had trouble waking up 06/11/2020   DDD (degenerative disc disease), lumbar    Diabetes mellitus without complication (HCC)    GERD (gastroesophageal reflux disease)    Graves disease    Hx of adenomatous colonic polyps 12/15/2017   Hyperlipidemia    Hypertension    Neuromuscular disorder (HCC)    neuropathy in feet   OSA on CPAP    Thyrotoxicosis  without thyroid  storm    Varicose veins of both lower extremities     Social History   Socioeconomic History   Marital status: Significant Other    Spouse name: Not on file   Number of children: 1   Years of education: Not on file   Highest education level: 12th grade  Occupational History   Occupation: Retiring  Tobacco Use   Smoking status: Former    Current packs/day: 0.00    Types: E-cigarettes, Cigarettes    Quit date: 12/03/2011    Years since quitting: 12.2   Smokeless tobacco: Never   Tobacco comments:    Former smoker 10/12/22  Vaping Use   Vaping status: Every Day   Substances: Nicotine , Flavoring  Substance and Sexual Activity   Alcohol use: Yes    Comment: whiskey, Burbon, as few drinks per night and more on weekends 10/12/22   Drug use: No   Sexual activity: Not on file  Other Topics Concern   Not on file  Social History Narrative   Patient is widowed.    He has girlfriend.   Right-handed.   Caffeine use: 2-3 cups per day.      Social Drivers of Corporate investment banker Strain: Low Risk  (01/26/2024)   Overall Financial Resource Strain (CARDIA)    Difficulty of Paying Living  Expenses: Not hard at all  Food Insecurity: No Food Insecurity (01/26/2024)   Hunger Vital Sign    Worried About Running Out of Food in the Last Year: Never true    Ran Out of Food in the Last Year: Never true  Transportation Needs: No Transportation Needs (01/26/2024)   PRAPARE - Administrator, Civil Service (Medical): No    Lack of Transportation (Non-Medical): No  Physical Activity: Insufficiently Active (01/26/2024)   Exercise Vital Sign    Days of Exercise per Week: 3 days    Minutes of Exercise per Session: 20 min  Stress: No Stress Concern Present (01/26/2024)   [employer] of Occupational Health - Occupational Stress Questionnaire    Feeling of Stress : Not at all  Social Connections: Socially Integrated (01/26/2024)   Social Connection and Isolation  Panel [NHANES]    Frequency of Communication with Friends and Family: More than three times a week    Frequency of Social Gatherings with Friends and Family: Twice a week    Attends Religious Services: 1 to 4 times per year    Active Member of [employer] or Organizations: Yes    Attends Banker Meetings: 1 to 4 times per year    Marital Status: Living with partner  Intimate Partner Violence: Not At Risk (12/19/2023)   Humiliation, Afraid, Rape, and Kick questionnaire    Fear of Current or Ex-Partner: No    Emotionally Abused: No    Physically Abused: No    Sexually Abused: No    Past Surgical History:  Procedure Laterality Date   ANTERIOR CERVICAL DECOMP/DISCECTOMY FUSION N/A 06/18/2021   Procedure: ANTERIOR CERVICAL DECOMPRESSION FUSION CERVICAL4- CERVICAL 5 WITH INSTRUEMENTATION AND ALLOGRAFT;  Surgeon: Virl Grimes, MD;  Location: [facility] OR;  Service: Orthopedics;  Laterality: N/A;   ATRIAL FIBRILLATION ABLATION N/A 09/14/2022   Procedure: ATRIAL FIBRILLATION ABLATION;  Surgeon: Lei Pump, MD;  Location: [facility] INVASIVE CV LAB;  Service: Cardiovascular;  Laterality: N/A;   BACK SURGERY  06/11/2020   BUBBLE STUDY  09/10/2022   Procedure: BUBBLE STUDY;  Surgeon: Jann Melody, MD;  Location: [facility] ENDOSCOPY;  Service: Cardiovascular;;   CARDIOVERSION N/A 12/22/2021   Procedure: CARDIOVERSION;  Surgeon: Loyde Rule, MD;  Location: [facility] ENDOSCOPY;  Service: Cardiovascular;  Laterality: N/A;   CARDIOVERSION N/A 10/18/2022   Procedure: CARDIOVERSION;  Surgeon: Euell Herrlich, MD;  Location: [facility] ENDOSCOPY;  Service: Cardiovascular;  Laterality: N/A;   ENDOVENOUS ABLATION SAPHENOUS VEIN W/ LASER Left 10/18/2019   endovenous laser ablation left greater saphenous vein and stab phlebectomy > 20 incisions left leg by Kirtland Perfect MD    ENDOVENOUS ABLATION SAPHENOUS VEIN W/ LASER Right 11/08/2019   endovenous laser ablation right greater saphenous vein and stab phlebectomy  10-20 incisions right leg by Kirtland Perfect MD    LUMBAR EPIDURAL INJECTION     TEE WITHOUT CARDIOVERSION N/A 12/22/2021   Procedure: TRANSESOPHAGEAL ECHOCARDIOGRAM (TEE);  Surgeon: Loyde Rule, MD;  Location: [facility] ENDOSCOPY;  Service: Cardiovascular;  Laterality: N/A;   TEE WITHOUT CARDIOVERSION N/A 09/10/2022   Procedure: TRANSESOPHAGEAL ECHOCARDIOGRAM (TEE);  Surgeon: Jann Melody, MD;  Location: [facility] ENDOSCOPY;  Service: Cardiovascular;  Laterality: N/A;    Family History  Problem Relation Age of Onset   Thyroid  disease Mother    Parkinson's disease Father    Colon cancer Neg Hx    Rectal cancer Neg Hx    Stomach cancer Neg Hx    Esophageal cancer Neg  Hx     No Known Allergies  Current Outpatient Medications on File Prior to Visit  Medication Sig Dispense Refill   acetaminophen  (TYLENOL ) 325 MG tablet Take 2 tablets (650 mg total) by mouth every 6 (six) hours as needed for mild pain (or Fever >/= 101).     albuterol  (VENTOLIN  HFA) 108 (90 Base) MCG/ACT inhaler Inhale 2 puffs into the lungs every 6 (six) hours as needed for wheezing or shortness of breath. 8 g 0   apixaban  (ELIQUIS ) 5 MG TABS tablet Take 1 tablet (5 mg total) by mouth 2 (two) times daily. 180 tablet 1   atenolol  (TENORMIN ) 50 MG tablet TAKE 1 TABLET BY MOUTH ONCE DAILY IN  THE  MORNING  AND  ONE  HALF  IN  THE  EVENING 90 tablet 0   Blood Gluc Meter Disp-Strips (BLOOD GLUCOSE METER DISPOSABLE) DEVI Use to check blood glucose TID 100 each 0   blood glucose meter kit and supplies KIT Dispense based on patient and insurance preference. Use up to four times daily as directed. 1 each 0   Blood Glucose Monitoring Suppl (FREESTYLE FREEDOM LITE) w/Device KIT Use to check blood sugar 3 times a day 1 kit 0   cetirizine (ZYRTEC) 10 MG tablet Take 10 mg by mouth daily as needed for allergies.     clotrimazole  (CLOTRIMAZOLE  AF) 1 % cream Apply topically twice daily as needed (rash). 45 g 2   clotrimazole -betamethasone   (LOTRISONE ) cream Apply 1 Application topically 2 (two) times daily. 30 g 1   Cyanocobalamin  (B-12 SL) Place 1 tablet under the tongue daily.     cyclobenzaprine  (FLEXERIL ) 10 MG tablet TAKE 1 TABLET BY MOUTH TWICE DAILY AS NEEDED FOR MUSCLE SPASM 90 tablet 0   doxycycline  (VIBRAMYCIN ) 100 MG capsule Take 1 capsule (100 mg total) by mouth 2 (two) times daily. 20 capsule 0   empagliflozin  (JARDIANCE ) 10 MG TABS tablet Take 1 tablet (10 mg total) by mouth daily before breakfast. 90 tablet 3   fenofibrate  (TRICOR ) 145 MG tablet Take 1 tablet (145 mg total) by mouth daily. 90 tablet 0   folic acid  (FOLVITE ) 1 MG tablet Take 1 tablet (1 mg total) by mouth daily.     furosemide  (LASIX ) 80 MG tablet Take 1 tablet by mouth twice daily as needed 180 tablet 0   gabapentin  (NEURONTIN ) 800 MG tablet TAKE 1 TABLET BY MOUTH THREE TIMES DAILY 270 tablet 0   glucose blood (FREESTYLE LITE) test strip Use to check blood sugar 3 times a day 300 each 12   HYDROcodone  bit-homatropine (HYCODAN) 5-1.5 MG/5ML syrup Take 5 mLs by mouth every 8 (eight) hours as needed for cough. 120 mL 0   Lancet Devices MISC Use to check blood glucose TID 100 each 0   Lancets (FREESTYLE) lancets Use to check blood sugar 3 times a day 300 each 12   lidocaine -prilocaine  (EMLA ) cream Apply 1 Application topically as needed (apply to both feet  daily). 30 g 0   metFORMIN  (GLUCOPHAGE -XR) 500 MG 24 hr tablet Take 1 tablet (500 mg) by mouth daily with breakfast. 90 tablet 3   methimazole  (TAPAZOLE ) 5 MG tablet TAKE 1 TABLET BY MOUTH IN THE MORNING 30 tablet 0   pantoprazole  (PROTONIX ) 40 MG tablet TAKE 1 TABLET BY MOUTH ONCE DAILY. APPOINTMENT REQUIRED FOR FUTURE REFILLS 90 tablet 0   sildenafil  (VIAGRA ) 100 MG tablet Take 1/2-1 tablet by mouth daily as needed for erectile dysfunction. 30 tablet 2   tamsulosin (  FLOMAX) 0.4 MG CAPS capsule Take 0.4 mg by mouth daily.     tazarotene (AVAGE) 0.1 % cream      tirzepatide  (MOUNJARO ) 5 MG/0.5ML Pen  Inject 5 mg into the skin once a week. 2 mL 3   tretinoin  (RETIN-A ) 0.05 % cream Apply 1 application. topically daily as needed (oil skin). 45 g 3   triamcinolone  cream (KENALOG ) 0.1 % Apply to affected area(s) twice daily for 14 days (Patient taking differently: 1 Application 2 (two) times daily as needed (irritation).) 45 g 0   No current facility-administered medications on file prior to visit.    BP 120/80   Pulse 67   Temp 97.7 F (36.5 C) (Oral)   Ht 6\' 2"  (1.88 m)   Wt 264 lb (119.7 kg)   SpO2 98%   BMI 33.90 kg/m       Objective:   Physical Exam Vitals and nursing note reviewed.  Constitutional:      Appearance: Normal appearance.  Cardiovascular:     Rate and Rhythm: Normal rate and regular rhythm.     Pulses: Normal pulses.          Popliteal pulses are 2+ on the right side and 2+ on the left side.       Dorsalis pedis pulses are 2+ on the right side and 2+ on the left side.       Posterior tibial pulses are 2+ on the right side and 2+ on the left side.     Heart sounds: Normal heart sounds.  Pulmonary:     Effort: Pulmonary effort is normal.     Breath sounds: Normal breath sounds.  Musculoskeletal:        General: Normal range of motion.     Right lower leg: No edema.     Left lower leg: Tenderness present. No bony tenderness. No edema.       Legs:     Comments: Tenderness with flexion and extension of the ankle   Skin:    General: Skin is warm and dry.     Findings: No bruising or erythema.  Neurological:     General: No focal deficit present.     Mental Status: He is alert and oriented to person, place, and time.  Psychiatric:        Mood and Affect: Mood normal.        Behavior: Behavior normal.        Thought Content: Thought content normal.        Judgment: Judgment normal.        Assessment & Plan:   1. Left medial tibial stress syndrome, initial encounter (Primary) - Symptoms seem to be related to shinsplints.  He does take a Flexeril   every night and has not noticed any improvement with doing so.  Will substitute his Flexeril  with volume for the next few nights.  Encouraged rest and ice is much as possible throughout the day.  Follow-up if not improving in the next 3 to 4 weeks or sooner if symptoms worsen or symptoms change - diazepam (VALIUM) 5 MG tablet; Take 1 tablet (5 mg total) by mouth every 12 (twelve) hours as needed for up to 5 days for anxiety.  Dispense: 10 tablet; Refill: 0  Alto Atta, NP

## 2024-03-07 ENCOUNTER — Encounter: Payer: Self-pay | Admitting: Adult Health

## 2024-03-07 ENCOUNTER — Telehealth: Payer: Self-pay

## 2024-03-07 NOTE — Telephone Encounter (Signed)
 Left message to return phone call.

## 2024-03-07 NOTE — Telephone Encounter (Signed)
 Copied from CRM [id number]. Topic: Clinical - Request for Lab/Test Order >> Mar 07, 2024  5:14 PM Juleen Oakland F wrote: Reason for CRM: Patient returning Kendra's phone call. If you call back Thursday 03/08/24 please call after 10am

## 2024-03-07 NOTE — Telephone Encounter (Signed)
 Noted.

## 2024-03-07 NOTE — Telephone Encounter (Signed)
 Pharmacy Patient Advocate Encounter  Received notification from  [employer] [facility] [employer]  that Prior Authorization for Cyclobenzaprine  HCl 10MG  tablets has been DENIED.  Full denial letter will be uploaded to the media tab. See denial reason below.   PA #/Case ID/Reference #: [id number]

## 2024-03-08 ENCOUNTER — Telehealth: Payer: Self-pay | Admitting: *Deleted

## 2024-03-08 ENCOUNTER — Other Ambulatory Visit: Payer: Self-pay | Admitting: Adult Health

## 2024-03-08 MED ORDER — METHOCARBAMOL 750 MG PO TABS: 750.0000 mg | ORAL_TABLET | Freq: Three times a day (TID) | ORAL | 1 refills | Status: DC | PRN

## 2024-03-08 NOTE — Telephone Encounter (Signed)
 Spoke with pt and advised of mychart message from [place] with drug Substitution.

## 2024-03-08 NOTE — Telephone Encounter (Signed)
 Copied from CRM [id number]. Topic: Clinical - Medical Advice >> Mar 08, 2024 11:27 AM [name] wrote: Reason for CRM: patient calling back regarding phone call he received. He thinks it has to do with his cyclobenzaprine  (FLEXERIL ) 10 MG tablet and possible alternatives. Please advise with patient regarding this call as he said he is now available. Thank you.

## 2024-03-08 NOTE — Telephone Encounter (Signed)
**Note De-identified  [place] Obfuscation** Please advise 

## 2024-03-13 ENCOUNTER — Telehealth: Payer: Self-pay

## 2024-03-13 NOTE — Telephone Encounter (Signed)
 Copied from CRM [id number]. Topic: General - Other >> Mar 13, 2024 11:46 AM Albertha Alosa wrote: Reason for CRM: [facility] pre service center called regarding patient being schedule for a pet scan would like a callback at   [phone] ext: [phone]

## 2024-03-16 ENCOUNTER — Other Ambulatory Visit (HOSPITAL_BASED_OUTPATIENT_CLINIC_OR_DEPARTMENT_OTHER): Payer: Self-pay | Admitting: Cardiovascular Disease

## 2024-03-16 ENCOUNTER — Encounter (HOSPITAL_COMMUNITY)
Admission: RE | Admit: 2024-03-16 | Discharge: 2024-03-16 | Disposition: A | Discharge status: Home / Self Care | Payer: Medicare (Managed Care) | Source: Ambulatory Visit | Attending: Adult Health

## 2024-03-16 ENCOUNTER — Encounter: Payer: Self-pay | Admitting: Adult Health

## 2024-03-16 DIAGNOSIS — E785 Hyperlipidemia, unspecified: Secondary | ICD-10-CM

## 2024-03-16 DIAGNOSIS — R911 Solitary pulmonary nodule: Secondary | ICD-10-CM | POA: Diagnosis not present

## 2024-03-16 LAB — GLUCOSE, CAPILLARY: Glucose-Capillary: 133 mg/dL — ABNORMAL HIGH (ref 70–99)

## 2024-03-16 MED ORDER — FLUDEOXYGLUCOSE F - 18 (FDG) INJECTION
14.5450 | Freq: Once | INTRAVENOUS | Status: AC | PRN
  Administered 2024-03-16: 14.545 via INTRAVENOUS

## 2024-03-16 NOTE — Progress Notes (Signed)
   03/16/2024  Patient ID: Jeffrey Price, male   DOB: [birth year], 68 y.o.   MRN: [id number]  Pharmacy Quality Measure Review  This patient is appearing on a report for being at risk of failing the adherence measure for cholesterol (statin) medications this calendar year.   Medication: Atorvastatin  20mg  Last fill date: 03/06/24 for 15 day supply  Insurance report was not up to date. No action needed at this time.   [name], PharmD Clinical Pharmacist [phone]

## 2024-03-20 ENCOUNTER — Ambulatory Visit: Payer: Medicare (Managed Care) | Attending: Cardiology | Admitting: Cardiology

## 2024-03-20 ENCOUNTER — Encounter: Payer: Self-pay | Admitting: Cardiology

## 2024-03-20 VITALS — BP 100/62 | HR 61 | Ht 74.0 in | Wt 254.0 lb

## 2024-03-20 DIAGNOSIS — I4819 Other persistent atrial fibrillation: Secondary | ICD-10-CM

## 2024-03-20 DIAGNOSIS — D6869 Other thrombophilia: Secondary | ICD-10-CM | POA: Diagnosis not present

## 2024-03-20 DIAGNOSIS — I10 Essential (primary) hypertension: Secondary | ICD-10-CM | POA: Diagnosis not present

## 2024-03-20 DIAGNOSIS — G4733 Obstructive sleep apnea (adult) (pediatric): Secondary | ICD-10-CM | POA: Diagnosis not present

## 2024-03-20 NOTE — Telephone Encounter (Signed)
 Please advise I don't see any notes for this.

## 2024-03-20 NOTE — Patient Instructions (Signed)
  Follow-Up: At [facility], you and your health needs are our priority.  As part of our continuing mission to provide you with exceptional heart care, our providers are all part of one team.  This team includes your primary Cardiologist (physician) and Advanced Practice Providers or APPs (Physician Assistants and Nurse Practitioners) who all work together to provide you with the care you need, when you need it.  Your next appointment:   12 month(s)  Provider:   [facility]

## 2024-03-20 NOTE — Progress Notes (Signed)
  Electrophysiology Office Note:   Date:  03/20/2024  ID:  Jeffrey Price, DOB 09-Jun-1956, MRN [id number]  Primary Cardiologist: Jeffrey Mediate, MD Primary Heart Failure: None Electrophysiologist: Jeffrey Henton Cortland Ding, MD      History of Present Illness:   Jeffrey Price is a 68 y.o. male with h/o hypertension, obesity, hyperlipidemia, hypothyroidism, diabetes, atrial fibrillation seen today for routine electrophysiology followup.   Since last being seen in our clinic the patient reports doing well.  He has had no further episodes of atrial fibrillation.  He is now on Mounjaro  and has been losing a significant amount of weight.  He is felt somewhat woozy over the last few days.  His blood pressure is mildly low today.  He thinks it is potentially due to his weight loss.  Aside from that, he is doing well.  He was trying to go to the gym, but developed shinsplints..  he denies chest pain, palpitations, dyspnea, PND, orthopnea, nausea, vomiting, dizziness, syncope, edema, weight gain, or early satiety.   Review of systems complete and found to be negative unless listed in HPI.   EP Information / Studies Reviewed:    EKG is ordered today. Personal review as below.        Risk Assessment/Calculations:    CHA2DS2-VASc Score = 4   This indicates a 4.8% annual risk of stroke. The patient's score is based upon: CHF History: 0 HTN History: 1 Diabetes History: 1 Stroke History: 0 Vascular Disease History: 1 Age Score: 1 Gender Score: 0             Physical Exam:   VS:  BP 100/62 (BP Location: Right Arm, Patient Position: Sitting, Cuff Size: Large)   Pulse 61   Ht 6\' 2"  (1.88 m)   Wt 254 lb (115.2 kg)   SpO2 98%   BMI 32.61 kg/m    Wt Readings from Last 3 Encounters:  03/20/24 254 lb (115.2 kg)  03/06/24 264 lb (119.7 kg)  02/17/24 270 lb 9.6 oz (122.7 kg)     GEN: Well nourished, well developed in no acute distress NECK: No JVD; No carotid bruits CARDIAC: Regular rate and  rhythm, no murmurs, rubs, gallops RESPIRATORY:  Clear to auscultation without rales, wheezing or rhonchi  ABDOMEN: Soft, non-tender, non-distended EXTREMITIES:  No edema; No deformity   ASSESSMENT AND PLAN:    1.  Persistent atrial fibrillation: Post ablation 09/14/2022.  He had a cardioversion 1 month later.  He has had no further episodes of atrial fibrillation.  Jeffrey Price continue with current management.  2.  Secondary hypercoagulable state: On Eliquis   3.  Hypertension: Blood pressure is low today.  Jeffrey Price continue with current management.  4.  Obstructive sleep apnea: CPAP compliance encouraged  Follow up with [facility] Clinic in 12 months  Signed, Jeffrey Gutterman Cortland Ding, MD

## 2024-03-21 ENCOUNTER — Other Ambulatory Visit: Payer: Self-pay | Admitting: Adult Health

## 2024-03-21 ENCOUNTER — Ambulatory Visit: Payer: Self-pay | Admitting: Adult Health

## 2024-03-21 DIAGNOSIS — R911 Solitary pulmonary nodule: Secondary | ICD-10-CM

## 2024-03-21 DIAGNOSIS — E785 Hyperlipidemia, unspecified: Secondary | ICD-10-CM

## 2024-03-21 MED ORDER — ATORVASTATIN CALCIUM 20 MG PO TABS: 20.0000 mg | ORAL_TABLET | Freq: Every day | ORAL | 1 refills | Status: DC

## 2024-03-21 MED ORDER — ATENOLOL 25 MG PO TABS: 25.0000 mg | ORAL_TABLET | Freq: Two times a day (BID) | ORAL | 3 refills | Status: AC

## 2024-03-23 NOTE — Progress Notes (Signed)
   03/23/2024  Patient ID: Jeffrey Price, male   DOB: 1956/03/25, 68 y.o.   MRN: [id number]  Pharmacy Quality Measure Review  This patient is appearing on a report for being at risk of failing the adherence measure for cholesterol (statin) medications this calendar year.   Medication: Atorvastatin  20mg  Last fill date: 03/21/24 for 90 day supply  Insurance report was not up to date. No action needed at this time.   [name], PharmD Clinical Pharmacist [phone]

## 2024-03-29 ENCOUNTER — Encounter: Payer: Self-pay | Admitting: Internal Medicine

## 2024-03-29 LAB — AMB RESULTS CONSOLE CBG: Glucose: 125

## 2024-03-29 LAB — HEMOGLOBIN A1C: Hemoglobin A1C: 6.3

## 2024-03-29 NOTE — Progress Notes (Signed)
 Patient came to mobile screening at [facility]. Patient wanted to monitor blood pressure as he has become more active and lost weight. BP 126/75. Non fasting glucose 125. A1C 6.3%. We discussed keeping up with the active lifestyle. No SDOH needs currently.

## 2024-03-30 MED ORDER — TIRZEPATIDE 7.5 MG/0.5ML ~~LOC~~ SOAJ: 7.5000 mg | SUBCUTANEOUS | 1 refills | Status: DC

## 2024-04-18 ENCOUNTER — Other Ambulatory Visit: Payer: Self-pay | Admitting: Adult Health

## 2024-04-18 DIAGNOSIS — E785 Hyperlipidemia, unspecified: Secondary | ICD-10-CM

## 2024-05-07 ENCOUNTER — Encounter: Payer: Self-pay | Admitting: Adult Health

## 2024-05-07 ENCOUNTER — Telehealth: Payer: Self-pay

## 2024-05-07 NOTE — Telephone Encounter (Signed)
 Copied from CRM [id number]. Topic: General - Other >> May 07, 2024 11:05 AM Chiquita SQUIBB wrote: Reason for CRM: [facility] imagining calling in stating that he is scheduled for a CT scan tomorrow and they are checking to see if authorization has been done yet. They need this by 12:00 or the appointment has to be cancelled. Please call Consuelo at [phone] OK to leave a voicemail

## 2024-05-08 ENCOUNTER — Ambulatory Visit
Admission: RE | Admit: 2024-05-08 | Discharge: 2024-05-08 | Disposition: A | Discharge status: Home / Self Care | Payer: Medicare (Managed Care) | Source: Ambulatory Visit | Attending: Adult Health | Admitting: Adult Health

## 2024-05-08 DIAGNOSIS — J439 Emphysema, unspecified: Secondary | ICD-10-CM | POA: Diagnosis not present

## 2024-05-08 DIAGNOSIS — I2510 Atherosclerotic heart disease of native coronary artery without angina pectoris: Secondary | ICD-10-CM | POA: Diagnosis not present

## 2024-05-08 DIAGNOSIS — R911 Solitary pulmonary nodule: Secondary | ICD-10-CM

## 2024-05-08 DIAGNOSIS — J984 Other disorders of lung: Secondary | ICD-10-CM | POA: Diagnosis not present

## 2024-05-08 DIAGNOSIS — K760 Fatty (change of) liver, not elsewhere classified: Secondary | ICD-10-CM | POA: Diagnosis not present

## 2024-05-09 NOTE — Telephone Encounter (Signed)
 Can you help with this please.

## 2024-05-15 ENCOUNTER — Encounter: Payer: Self-pay | Admitting: Adult Health

## 2024-05-15 DIAGNOSIS — H16142 Punctate keratitis, left eye: Secondary | ICD-10-CM | POA: Diagnosis not present

## 2024-05-16 ENCOUNTER — Ambulatory Visit: Payer: Self-pay | Admitting: Adult Health

## 2024-05-16 ENCOUNTER — Encounter: Payer: Self-pay | Admitting: Adult Health

## 2024-05-16 ENCOUNTER — Other Ambulatory Visit: Payer: Self-pay | Admitting: Adult Health

## 2024-05-16 DIAGNOSIS — R911 Solitary pulmonary nodule: Secondary | ICD-10-CM

## 2024-05-16 MED ORDER — TAZAROTENE 0.1 % EX CREA: TOPICAL_CREAM | CUTANEOUS | 1 refills | Status: AC

## 2024-05-16 MED ORDER — TRETINOIN 0.05 % EX CREA: 1.0000 | TOPICAL_CREAM | Freq: Every day | CUTANEOUS | 3 refills | Status: DC | PRN

## 2024-05-16 NOTE — Telephone Encounter (Signed)
 This was last filled 2 years ago by another provider. Ok to fill?

## 2024-05-17 NOTE — Telephone Encounter (Signed)
**Note De-identified  [place] Obfuscation** Please advise 

## 2024-05-19 ENCOUNTER — Other Ambulatory Visit: Payer: Self-pay | Admitting: Adult Health

## 2024-05-19 DIAGNOSIS — E1142 Type 2 diabetes mellitus with diabetic polyneuropathy: Secondary | ICD-10-CM

## 2024-05-19 DIAGNOSIS — K219 Gastro-esophageal reflux disease without esophagitis: Secondary | ICD-10-CM

## 2024-05-20 ENCOUNTER — Encounter: Payer: Self-pay | Admitting: Internal Medicine

## 2024-05-22 ENCOUNTER — Encounter: Payer: Self-pay | Admitting: Internal Medicine

## 2024-05-22 ENCOUNTER — Other Ambulatory Visit: Payer: Self-pay | Admitting: Adult Health

## 2024-05-22 NOTE — Telephone Encounter (Unsigned)
 Copied from CRM [id number]. Topic: Clinical - Medication Refill >> May 22, 2024 11:34 AM Shereese L wrote: Medication: tazarotene  (AVAGE ) 0.1 % cream triamcinolone  cream (KENALOG ) 0.1 % empagliflozin  (JARDIANCE ) 10 MG TABS tablet furosemide  (LASIX ) 80 MG tablet pantoprazole  (PROTONIX ) 40 MG tablet  Has the patient contacted their pharmacy? Yes (Agent: If no, request that the patient contact the pharmacy for the refill. If patient does not wish to contact the pharmacy document the reason why and proceed with request.) (Agent: If yes, when and what did the pharmacy advise?)  This is the patient's preferred pharmacy:  [facility] [address] [place], [place] - [address] [place] [address] Phone: [phone] Fax: [phone]   Is this the correct pharmacy for this prescription? Yes If no, delete pharmacy and type the correct one.   Has the prescription been filled recently? Yes  Is the patient out of the medication? Yes  Has the patient been seen for an appointment in the last year OR does the patient have an upcoming appointment? Yes  Can we respond through MyChart? Yes  Agent: Please be advised that Rx refills may take up to 3 business days. We ask that you follow-up with your pharmacy.

## 2024-05-23 ENCOUNTER — Ambulatory Visit (INDEPENDENT_AMBULATORY_CARE_PROVIDER_SITE_OTHER): Payer: Medicare (Managed Care) | Admitting: Family Medicine

## 2024-05-23 ENCOUNTER — Encounter: Payer: Self-pay | Admitting: Family Medicine

## 2024-05-23 VITALS — BP 100/68 | HR 70 | Resp 16 | Ht 74.0 in | Wt 243.0 lb

## 2024-05-23 DIAGNOSIS — M79605 Pain in left leg: Secondary | ICD-10-CM

## 2024-05-23 DIAGNOSIS — K429 Umbilical hernia without obstruction or gangrene: Secondary | ICD-10-CM | POA: Diagnosis not present

## 2024-05-23 DIAGNOSIS — M79604 Pain in right leg: Secondary | ICD-10-CM | POA: Diagnosis not present

## 2024-05-23 DIAGNOSIS — M5416 Radiculopathy, lumbar region: Secondary | ICD-10-CM

## 2024-05-23 DIAGNOSIS — R21 Rash and other nonspecific skin eruption: Secondary | ICD-10-CM

## 2024-05-23 DIAGNOSIS — B354 Tinea corporis: Secondary | ICD-10-CM

## 2024-05-23 MED ORDER — PREDNISONE 20 MG PO TABS: ORAL_TABLET | ORAL | 0 refills | Status: DC

## 2024-05-23 MED ORDER — EMPAGLIFLOZIN 10 MG PO TABS: 10.0000 mg | ORAL_TABLET | Freq: Every day | ORAL | 0 refills | Status: DC

## 2024-05-23 MED ORDER — HYDROCODONE-ACETAMINOPHEN 5-325 MG PO TABS
1.0000 | ORAL_TABLET | Freq: Three times a day (TID) | ORAL | 0 refills | Status: AC | PRN
Start: 2024-05-23 — End: 2024-05-28

## 2024-05-23 MED ORDER — KETOCONAZOLE 2 % EX CREA: 1.0000 | TOPICAL_CREAM | Freq: Every day | CUTANEOUS | 0 refills | Status: DC

## 2024-05-23 NOTE — Progress Notes (Signed)
 ACUTE VISIT Chief Complaint  Patient presents with  . pinched nerve    Down both legs, has been using Tyelnol; unable to get comfortable to sleep   . Referral    hernia   HPI: Jeffrey Price is a 68 y.o. male, a patient of Wellsite geologist, NP, who was unavailable today, with a PMHx significant for TAA, HTN, PAF, Graves Disease, DMII, HLD, and many others conditions, who is here today complaining of Bilateral Leg Pain; Hernia.  Lower Back Pain w/ Bilateral Leg Pain: Hx of Peripheral Neuropathy and Double Crush Syndrome.  He says that pain started suddenly yesterday without any recent injury possibly contributing, did some exercise on a treadmill and elliptical bike.  Describes pain in distal lateral aspect of legs as constant and severe 10/10, worsened over time since onset; associated symptoms are numbness, burning, tingling. Denies abdominal pain, nausea, or vomiting.  Periumbilical Hernia x2: Says that he's had two periumbilical hernias for the past year, and now that he's started losing weight, these are becoming more noticeable and aggravating.   Rash, Bilateral Arms: Says that he's had a rash on his forearms, which did not resolve after using Clotrimazole . On chart review, it seems that Triamcinolone  has been used in the past and was effective.  Review of Systems  Constitutional:  Negative for appetite change, chills, fatigue and fever.  Gastrointestinal:  Negative for abdominal pain, nausea and vomiting.       (+) Hernias  Genitourinary:  Negative for decreased urine volume, difficulty urinating, dysuria, hematuria and urgency.  Musculoskeletal:  Positive for back pain. Negative for neck pain.  Skin:  Positive for rash. Negative for color change.  Neurological:  Negative for syncope, weakness, numbness and headaches.  Psychiatric/Behavioral:  Negative for confusion. The patient is not nervous/anxious.    See other pertinent positives and negatives in HPI.  Current  Outpatient Medications on File Prior to Visit  Medication Sig Dispense Refill  . acetaminophen  (TYLENOL ) 325 MG tablet Take 2 tablets (650 mg total) by mouth every 6 (six) hours as needed for mild pain (or Fever >/= 101).    . albuterol  (VENTOLIN  HFA) 108 (90 Base) MCG/ACT inhaler Inhale 2 puffs into the lungs every 6 (six) hours as needed for wheezing or shortness of breath. 8 g 0  . apixaban  (ELIQUIS ) 5 MG TABS tablet Take 1 tablet (5 mg total) by mouth 2 (two) times daily. 180 tablet 1  . atenolol  (TENORMIN ) 25 MG tablet Take 1 tablet (25 mg total) by mouth 2 (two) times daily. 180 tablet 3  . atorvastatin  (LIPITOR) 20 MG tablet Take 1 tablet (20 mg total) by mouth daily. 90 tablet 1  . Blood Gluc Meter Disp-Strips (BLOOD GLUCOSE METER DISPOSABLE) DEVI Use to check blood glucose TID 100 each 0  . blood glucose meter kit and supplies KIT Dispense based on patient and insurance preference. Use up to four times daily as directed. 1 each 0  . Blood Glucose Monitoring Suppl (FREESTYLE FREEDOM LITE) w/Device KIT Use to check blood sugar 3 times a day 1 kit 0  . cetirizine (ZYRTEC) 10 MG tablet Take 10 mg by mouth daily as needed for allergies.    . clotrimazole  (CLOTRIMAZOLE  AF) 1 % cream Apply topically twice daily as needed (rash). 45 g 2  . clotrimazole -betamethasone  (LOTRISONE ) cream Apply 1 Application topically 2 (two) times daily. 30 g 1  . Cyanocobalamin  (B-12 SL) Place 1 tablet under the tongue daily.    . fenofibrate  (  TRICOR ) 145 MG tablet Take 1 tablet by mouth once daily 90 tablet 1  . folic acid  (FOLVITE ) 1 MG tablet Take 1 tablet (1 mg total) by mouth daily.    . furosemide  (LASIX ) 80 MG tablet Take 1 tablet by mouth twice daily as needed 180 tablet 0  . gabapentin  (NEURONTIN ) 800 MG tablet TAKE 1 TABLET BY MOUTH THREE TIMES DAILY 270 tablet 0  . glucose blood (FREESTYLE LITE) test strip Use to check blood sugar 3 times a day 300 each 12  . Lancet Devices MISC Use to check blood glucose  TID 100 each 0  . Lancets (FREESTYLE) lancets Use to check blood sugar 3 times a day 300 each 12  . lidocaine -prilocaine  (EMLA ) cream Apply 1 Application topically as needed (apply to both feet  daily). 30 g 0  . metFORMIN  (GLUCOPHAGE -XR) 500 MG 24 hr tablet Take 1 tablet (500 mg) by mouth daily with breakfast. 90 tablet 3  . methimazole  (TAPAZOLE ) 5 MG tablet TAKE 1 TABLET BY MOUTH IN THE MORNING 30 tablet 0  . methocarbamol  (ROBAXIN -750) 750 MG tablet Take 1 tablet (750 mg total) by mouth every 8 (eight) hours as needed for muscle spasms. 90 tablet 1  . pantoprazole  (PROTONIX ) 40 MG tablet TAKE 1 TABLET BY MOUTH ONCE DAILY . APPOINTMENT REQUIRED FOR FUTURE REFILLS 90 tablet 0  . sildenafil  (VIAGRA ) 100 MG tablet Take 1/2-1 tablet by mouth daily as needed for erectile dysfunction. 30 tablet 2  . tamsulosin (FLOMAX) 0.4 MG CAPS capsule Take 0.4 mg by mouth daily.    . tazarotene  (AVAGE ) 0.1 % cream Apply a thin layer to affected area(s) at nighttime 60 g 1  . tirzepatide  (MOUNJARO ) 7.5 MG/0.5ML Pen Inject 7.5 mg into the skin once a week. 6 mL 1  . tretinoin  (RETIN-A ) 0.05 % cream Apply 1 application  topically daily as needed (oil skin). 45 g 3  . triamcinolone  cream (KENALOG ) 0.1 % Apply to affected area(s) twice daily for 14 days (Patient taking differently: 1 Application 2 (two) times daily as needed (irritation).) 45 g 0  . empagliflozin  (JARDIANCE ) 10 MG TABS tablet Take 1 tablet (10 mg total) by mouth daily before breakfast. 90 tablet 0   No current facility-administered medications on file prior to visit.    Past Medical History:  Diagnosis Date  . Arthritis    in back  . Atrial fibrillation (HCC)   . Blood clot in vein 1995   bil legs  . Bulging lumbar disc   . Cataract    mild  . Cellulitis 2012   forehead  . Complication of anesthesia    had trouble waking up 06/11/2020  . DDD (degenerative disc disease), lumbar   . Diabetes mellitus without complication (HCC)   . GERD  (gastroesophageal reflux disease)   . Graves disease   . Hx of adenomatous colonic polyps 12/15/2017  . Hyperlipidemia   . Hypertension   . Neuromuscular disorder (HCC)    neuropathy in feet  . OSA on CPAP   . Sleep apnea   . Thyrotoxicosis without thyroid  storm   . Varicose veins of both lower extremities    No Known Allergies  Social History   Socioeconomic History  . Marital status: Significant Other    Spouse name: Not on file  . Number of children: 1  . Years of education: Not on file  . Highest education level: 12th grade  Occupational History  . Occupation: Retiring  Tobacco Use  .  Smoking status: Former    Current packs/day: 0.00    Types: E-cigarettes, Cigarettes    Quit date: 12/03/2011    Years since quitting: 12.4  . Smokeless tobacco: Never  . Tobacco comments:    Former smoker 10/12/22  Vaping Use  . Vaping status: Every Day  . Substances: Nicotine , Flavoring  Substance and Sexual Activity  . Alcohol use: Yes    Comment: whiskey, Burbon, as few drinks per night and more on weekends 10/12/22  . Drug use: No  . Sexual activity: Not on file  Other Topics Concern  . Not on file  Social History Narrative   Patient is widowed.    He has girlfriend.   Right-handed.   Caffeine use: 2-3 cups per day.      Social Drivers of Corporate investment banker Strain: Low Risk  (05/22/2024)   Overall Financial Resource Strain (CARDIA)   . Difficulty of Paying Living Expenses: Not hard at all  Food Insecurity: No Food Insecurity (05/22/2024)   Hunger Vital Sign   . Worried About Programme researcher, broadcasting/film/video in the Last Year: Never true   . Ran Out of Food in the Last Year: Never true  Transportation Needs: No Transportation Needs (05/22/2024)   PRAPARE - Transportation   . Lack of Transportation (Medical): No   . Lack of Transportation (Non-Medical): No  Physical Activity: Insufficiently Active (05/22/2024)   Exercise Vital Sign   . Days of Exercise per Week: 5 days   .  Minutes of Exercise per Session: 20 min  Stress: No Stress Concern Present (05/22/2024)   [employer] of Occupational Health - Occupational Stress Questionnaire   . Feeling of Stress: Only a little  Social Connections: Moderately Integrated (05/22/2024)   Social Connection and Isolation Panel   . Frequency of Communication with Friends and Family: More than three times a week   . Frequency of Social Gatherings with Friends and Family: Twice a week   . Attends Religious Services: 1 to 4 times per year   . Active Member of Clubs or Organizations: No   . Attends Banker Meetings: Not on file   . Marital Status: Living with partner    Vitals:   05/23/24 0850  BP: 100/68  Pulse: 70  Resp: 16  SpO2: 96%   Body mass index is 31.2 kg/m.  Physical Exam Vitals and nursing note reviewed.  Constitutional:      General: He is not in acute distress.    Appearance: He is well-developed.  HENT:     Head: Normocephalic and atraumatic.  Eyes:     Conjunctiva/sclera: Conjunctivae normal.  Cardiovascular:     Rate and Rhythm: Normal rate and regular rhythm.     Heart sounds: No murmur heard.    Comments: DP, PT, and popliteal pulses palpable, bilateral. Varicose veins LE's, bilateral. Trace pitting LE edema, bilateral. Pulmonary:     Effort: Pulmonary effort is normal. No respiratory distress.     Breath sounds: Normal breath sounds.  Abdominal:     Palpations: Abdomen is soft. There is no hepatomegaly or mass.     Tenderness: There is no abdominal tenderness.     Hernia: A hernia is present. Hernia is present in the umbilical area (periumbilical).   Musculoskeletal:     Lumbar back: No tenderness or bony tenderness. Negative right straight leg raise test and negative left straight leg raise test.     Right lower leg: No deformity or tenderness.  Left lower leg: No deformity or tenderness.     Comments: Pain elicited with movement on exam table during  examination. No local edema or erythema appreciated, no suspicious lesions.  Skin:    General: Skin is warm.     Findings: Rash present. No erythema. Rash is macular.     Comments: Mildly hyperpigmented circular pattern lesions on forearms, L>R, minimal scaly borer and clear center.  Neurological:     Mental Status: He is alert and oriented to person, place, and time.     Coordination: Coordination normal.     Deep Tendon Reflexes:     Reflex Scores:      Patellar reflexes are 2+ on the right side and 2+ on the left side.      Achilles reflexes are 2+ on the right side and 2+ on the left side.    Comments: Antalgic gait, not assisted.  Psychiatric:        Mood and Affect: Mood and affect normal.    ASSESSMENT AND PLAN:  Jeffrey Price was seen here today for Lower Back Pain w/ Bilateral Leg Pain.  Pain in both lower extremities -     predniSONE ; 3 tabs for 3 days, 2 tabs for 3 days, 1 tabs for 3 days, and 1/2 tab for 3 days. Take tables together with breakfast.  Dispense: 20 tablet; Refill: 0 -     HYDROcodone -Acetaminophen ; Take 1 tablet by mouth every 8 (eight) hours as needed for up to 5 days for moderate pain (pain score 4-6).  Dispense: 15 tablet; Refill: 0  Umbilical hernia without obstruction and without gangrene -     Ambulatory referral to General Surgery  Skin rash  Tinea corporis -     Ketoconazole ; Apply 1 Application topically daily.  Dispense: 30 g; Refill: 0  Lumbar radiculopathy -     predniSONE ; 3 tabs for 3 days, 2 tabs for 3 days, 1 tabs for 3 days, and 1/2 tab for 3 days. Take tables together with breakfast.  Dispense: 20 tablet; Refill: 0 -     HYDROcodone -Acetaminophen ; Take 1 tablet by mouth every 8 (eight) hours as needed for up to 5 days for moderate pain (pain score 4-6).  Dispense: 15 tablet; Refill: 0  We discussed possible causes, reporting that the rash is getting worse, will treat empirically for tinea with ketoconazole .  Recommend follow-up with  PCP if rash is persistent to discuss need for dermatology referral. ***  Return in about 2 weeks (around 06/06/2024) for back and leg pain with PCP. I,Emily Lagle,acting as a Neurosurgeon for Genaro Bekker [place], MD.,have documented all relevant documentation on the behalf of Avalina Benko [place], MD,as directed by  Aranza Geddes [place], MD while in the presence of Seth Higginbotham [place], MD.  *** (refresh reminder)  I, Debe Anfinson [place], MD, have reviewed all documentation for this visit. The documentation on 05/23/24 for the exam, diagnosis, procedures, and orders are all accurate and complete. Terisha Losasso G. [place], MD  [facility]. [place] office.  Discharge Instructions   None

## 2024-05-23 NOTE — Patient Instructions (Addendum)
 A few things to remember from today's visit:  Pain in both lower extremities - Plan: predniSONE  (DELTASONE ) 20 MG tablet, HYDROcodone -acetaminophen  (NORCO/VICODIN) 5-325 MG tablet  Umbilical hernia without obstruction and without gangrene - Plan: Ambulatory referral to General Surgery  Skin rash  Tinea corporis - Plan: ketoconazole  (NIZORAL ) 2 % cream  Lumbar radiculopathy - Plan: predniSONE  (DELTASONE ) 20 MG tablet, HYDROcodone -acetaminophen  (NORCO/VICODIN) 5-325 MG tablet  Apply ketoconazole  cream on skin lesions and follow with PCP is not resolved in 3-4 weeks. Take Prednisone  with breakfast. Fall precautions. Arrange a follow up appt, 2 weeks.  If you need refills for medications you take chronically, please call your pharmacy. Do not use My Chart to request refills or for acute issues that need immediate attention. If you send a my chart message, it may take a few days to be addressed, specially if I am not in the office.  Please be sure medication list is accurate. If a new problem present, please set up appointment sooner than planned today.

## 2024-05-24 ENCOUNTER — Ambulatory Visit: Payer: Medicare (Managed Care) | Admitting: Adult Health

## 2024-05-25 ENCOUNTER — Other Ambulatory Visit (HOSPITAL_COMMUNITY): Payer: Self-pay

## 2024-05-25 MED ORDER — KETOCONAZOLE 2 % EX CREA: 1.0000 | TOPICAL_CREAM | Freq: Every day | CUTANEOUS | 0 refills | Status: DC

## 2024-05-25 NOTE — Addendum Note (Signed)
 Addended by: EVELINE DOMINO E on: 05/25/2024 07:00 AM   Modules accepted: Orders

## 2024-05-28 MED ORDER — TRIAMCINOLONE ACETONIDE 0.1 % EX CREA: 1.0000 | TOPICAL_CREAM | Freq: Two times a day (BID) | CUTANEOUS | 2 refills | Status: DC | PRN

## 2024-05-28 MED ORDER — TRETINOIN 0.05 % EX CREA: 1.0000 | TOPICAL_CREAM | Freq: Every day | CUTANEOUS | 3 refills | Status: AC | PRN

## 2024-05-28 NOTE — Addendum Note (Signed)
 Addended by: KATHRYNE MILLMAN B on: 05/28/2024 02:03 PM   Modules accepted: Orders

## 2024-05-29 ENCOUNTER — Telehealth: Payer: Self-pay

## 2024-05-29 ENCOUNTER — Other Ambulatory Visit (HOSPITAL_COMMUNITY): Payer: Self-pay

## 2024-05-29 NOTE — Telephone Encounter (Signed)
 Pharmacy Patient Advocate Encounter   Received notification from Onbase that prior authorization for Tretinoin  0.05% cream is required/requested.   Insurance verification completed.   The patient is insured through [employer] .   Per test claim: PA required; PA submitted to above mentioned insurance via CoverMyMeds Key/confirmation #/EOC [id number] Status is pending

## 2024-05-29 NOTE — Telephone Encounter (Signed)
   Pre-operative Risk Assessment    Patient Name: Jeffrey Price  DOB: [birth year] MRN: [id number]   Date of last office visit: 03/20/24 Date of next office visit: n/a   Request for Surgical Clearance    Procedure:  open umbilical and supraumbilical hernia repair with mesh surgery   Date of Surgery:  Clearance TBD                                 Surgeon:  Lonni Pizza, MD  Surgeon's Group or Practice Name:  [facility]  Phone number:  [phone] Fax number:  [phone]   Type of Clearance Requested:   - Medical  - Pharmacy:  Hold Apixaban  (Eliquis ) Not indicated    Type of Anesthesia:  General    Additional requests/questions:    Bonney Rebeca Blight   05/29/2024, 8:56 AM

## 2024-05-30 ENCOUNTER — Telehealth: Payer: Self-pay

## 2024-05-30 ENCOUNTER — Other Ambulatory Visit (HOSPITAL_COMMUNITY): Payer: Self-pay

## 2024-05-30 NOTE — Telephone Encounter (Signed)
 Pharmacy Patient Advocate Encounter   Received notification from CoverMyMeds that prior authorization for Tazarotene  0.1% cream  is required/requested.   Insurance verification completed.   The patient is insured through [employer] .   Per test claim: PA required; PA started via CoverMyMeds. KEY [id number] . Waiting for clinical questions to populate.

## 2024-05-30 NOTE — Telephone Encounter (Signed)
 Pharmacy Patient Advocate Encounter  Received notification from [employer] that Prior Authorization for Tretinoin  0.05% cream  has been DENIED.  Full denial letter will be uploaded to the media tab. See denial reason below.   PA #/Case ID/Reference #: [id number]

## 2024-05-30 NOTE — Telephone Encounter (Signed)
**Note De-identified  [place] Obfuscation** Please advise 

## 2024-05-31 NOTE — Telephone Encounter (Signed)
 PLEASE BE ADVISED Clinical questions have been answered and PA submitted.TO PLAN. PA currently Pending.

## 2024-05-31 NOTE — Telephone Encounter (Signed)
 Pharmacy Patient Advocate Encounter  Received notification from [employer] that Prior Authorization for Tazarotene  0.1% cream has been APPROVED from may be resubmitted from 05/01/2024 to 05/31/2025   CaseId:[id number];Status:Approved;Review Type:Prior Auth;Coverage Start Date:05/01/2024;Coverage End Date:05/31/2025; Effective Date: 05/01/2024 Authorization Expiration Date: 05/31/2025

## 2024-05-31 NOTE — Telephone Encounter (Signed)
 Noted

## 2024-06-01 NOTE — Telephone Encounter (Signed)
 Left message for patient to call us  back to get him scheduled for a phone visit.

## 2024-06-01 NOTE — Telephone Encounter (Signed)
 Patient with diagnosis of atrial fibrillation on Eliquis  for anticoagulation.    Procedure:  open umbilical and supraumbilical hernia repair with mesh surgery    Date of Surgery:  Clearance TBD      CHA2DS2-VASc Score = 4   This indicates a 4.8% annual risk of stroke. The patient's score is based upon: CHF History: 0 HTN History: 1 Diabetes History: 1 Stroke History: 0 Vascular Disease History: 1 Age Score: 1 Gender Score: 0    CrCl 110 Platelet count 206  Patient has not had an Afib/aflutter ablation within the last 3 months or DCCV within the last 30 days  Per office protocol, patient can hold Eliquis  for 2 days prior to procedure.   Patient will not need bridging with Lovenox (enoxaparin) around procedure.  **This guidance is not considered finalized until pre-operative APP has relayed final recommendations.**

## 2024-06-01 NOTE — Telephone Encounter (Signed)
   Name: Jeffrey Price  DOB: [birth year]  MRN: [id number]  Primary Cardiologist: Maude Emmer, MD   Preoperative team, please contact this patient and set up a phone call appointment for further preoperative risk assessment. Please obtain consent and complete medication review. Last seen 03/20/2024. Thank you for your help.  I confirm that guidance regarding antiplatelet and oral anticoagulation therapy has been completed and, if necessary, noted below.  Per office protocol, patient can hold Eliquis  for 2 days prior to procedure.   Patient will not need bridging with Lovenox (enoxaparin) around procedure.  I also confirmed the patient resides in the state of [place] . As per [facility] Medical Board telemedicine laws, the patient must reside in the state in which the provider is licensed.   Lamarr Satterfield, NP 06/01/2024, 3:08 PM [facility]

## 2024-06-04 NOTE — Progress Notes (Unsigned)
 Jeffrey Price

## 2024-06-05 ENCOUNTER — Encounter: Payer: Self-pay | Admitting: Neurology

## 2024-06-05 ENCOUNTER — Ambulatory Visit: Payer: Medicare (Managed Care) | Admitting: Neurology

## 2024-06-05 VITALS — BP 108/60 | HR 66 | Ht 74.0 in | Wt 245.0 lb

## 2024-06-05 DIAGNOSIS — I480 Paroxysmal atrial fibrillation: Secondary | ICD-10-CM

## 2024-06-05 DIAGNOSIS — I7121 Aneurysm of the ascending aorta, without rupture: Secondary | ICD-10-CM | POA: Diagnosis not present

## 2024-06-05 DIAGNOSIS — J439 Emphysema, unspecified: Secondary | ICD-10-CM | POA: Diagnosis not present

## 2024-06-05 DIAGNOSIS — M50022 Cervical disc disorder at C5-C6 level with myelopathy: Secondary | ICD-10-CM

## 2024-06-05 DIAGNOSIS — Z7984 Long term (current) use of oral hypoglycemic drugs: Secondary | ICD-10-CM

## 2024-06-05 DIAGNOSIS — G4733 Obstructive sleep apnea (adult) (pediatric): Secondary | ICD-10-CM | POA: Diagnosis not present

## 2024-06-05 DIAGNOSIS — E1142 Type 2 diabetes mellitus with diabetic polyneuropathy: Secondary | ICD-10-CM

## 2024-06-05 DIAGNOSIS — I2510 Atherosclerotic heart disease of native coronary artery without angina pectoris: Secondary | ICD-10-CM | POA: Diagnosis not present

## 2024-06-05 MED ORDER — LIDOCAINE-PRILOCAINE 2.5-2.5 % EX CREA: 1.0000 | TOPICAL_CREAM | CUTANEOUS | 0 refills | Status: DC | PRN

## 2024-06-05 MED ORDER — GABAPENTIN 800 MG PO TABS: 800.0000 mg | ORAL_TABLET | Freq: Two times a day (BID) | ORAL | 0 refills | Status: DC

## 2024-06-05 NOTE — Progress Notes (Signed)
 Provider:  Dedra Gores, MD  Primary Care Physician:  Merna Huxley, NP [address] [place] [address]     Referring Provider: Merna Huxley, Np [address] [place],  [place] [address]          Chief Complaint according to patient   Patient presents with:                HISTORY OF PRESENT ILLNESS:  Jeffrey Price is a 68 y.o. male patient who is here for revisit 06/05/2024 for  CPAP compliance: he also l has neuropathy with pain, he never had surgery for carpal tunnel dx in 06-06-2023 . Lidocaine  cream for his feet has helped a little bit,  he feels the pain in his feet when he is in bed, not while walking. ( Circulation??). Not creepy crawly sensations.  He also ws dx with possible double crush injuries to the neck last year.   Chief concern according to patient :  I have new insurance and wondered if I can change from CPAP to inspire ?   Can I try Lyrica          Jeffrey Price is a 68 y.o. male patient who is here for revisit 06/06/2023 . Chief concern according to patient :  Recently having flank pain and burning urination, 14 days of ATB didn't  help. Wednesday is follow up .   Pain, tingling numbness in both feet and hands, feet are too painful to walk barefoot. Long-standing hx of diabetic disease.   OSA on CPAP.  The patient has been highly pliant with CPAP 100% of the last 30 days 27 of these days over 4 hours with an average of 6 hours.  Uses an auto CPAP between 8 and 20 cm water pressure with 3 cm EPR, his 95th percentile pressure is 14 cm water he does have still high air leakage but very few residual apneas.  His AHI is 1.3/h.  So I do not need to change any of the settings I will like for him to get the best fitting mask possible.       Has OSA : Jeffrey Price is a 68 y.o.  Caucasian male patient seen here in a consultation on 05/31/2022 from PCP Koeberlein, MD,  for follow up on  sleep apnea, atrial fibrillation:   This  is a revisit for this gentleman whom I have seen in 2022 when he was referred for an evaluation of possible having sleep apnea following that he had atrial fibrillation shortness of breath and that his girlfriend had recorded his reported his snoring and she had witnessed apneas. The patient underwent cardioversion with dr. Inocencio, which did not work, he is pending ablation procedure. I had ordered a SPLIT night study 10-23-2021, but insurance only allowed a HST-    Calculated pAHI (per hour):   Apnea-hypopnea index was severe: overall 74.3/h, during REM sleep 83/h, and during non-REM sleep 73.3/h.                                           Positional AHI: In supine sleep the AHI exacerbated to 81.1/h in prone sleep 40.9/h on the left side 75.6/h.  Apnea was severe in all sleep positions. 16-Dec 2022;reviewed the patient's operative chart he underwent anterior cervical decompression with fusion at cervical level  C4-C5 with allograft. His diagnosis was cervical myelopathy with disc herniation resulting in spinal cord compression. Surgical date was 06-18-2021. It was after his anesthesia was completed and the surgery completed that they were mentioning of apneic events being witnessed.I reviewed the patient's operative chart he underwent anterior cervical decompression with fusion at cervical level C4-C5 with allograft. His diagnosis was cervical myelopathy with disc herniation resulting in spinal cord compression. Surgical date was 06-18-2021.     Fam Hx : see below   Social HX; See below       Review of Systems: Out of a complete 14 system review, the patient complains of only the following symptoms, and all other reviewed systems are negative.:   SLEEPINESS ?  How likely are you to doze in the following situations: 0 = not likely, 1 = slight chance, 2 = moderate chance, 3 = high chance  Sitting and Reading? Watching Television? Sitting inactive in a public place (theater or meeting)? Lying  down in the afternoon when circumstances permit? Sitting and talking to someone? Sitting quietly after lunch without alcohol? In a car, while stopped for a few minutes in traffic? As a passenger in a car for an hour without a break?  Total = 2/ 24  FSS at  28/ 63 points.  GDS 1/ 15.      Social History   Socioeconomic History   Marital status: Significant Other    Spouse name: Not on file   Number of children: 1   Years of education: Not on file   Highest education level: 12th grade  Occupational History   Occupation: Retiring  Tobacco Use   Smoking status: Former    Current packs/day: 0.00    Types: E-cigarettes, Cigarettes    Quit date: 12/03/2011    Years since quitting: 12.5   Smokeless tobacco: Never   Tobacco comments:    Former smoker 10/12/22  Vaping Use   Vaping status: Every Day   Substances: Nicotine , Flavoring  Substance and Sexual Activity   Alcohol use: Yes    Comment: whiskey, Burbon, as few drinks per night and more on weekends 10/12/22   Drug use: No   Sexual activity: Not on file  Other Topics Concern   Not on file  Social History Narrative   Patient is widowed.    He has girlfriend.   Right-handed.   Caffeine use: 2-3 cups per day.      Social Drivers of Corporate investment banker Strain: Low Risk  (05/22/2024)   Overall Financial Resource Strain (CARDIA)    Difficulty of Paying Living Expenses: Not hard at all  Food Insecurity: No Food Insecurity (05/22/2024)   Hunger Vital Sign    Worried About Running Out of Food in the Last Year: Never true    Ran Out of Food in the Last Year: Never true  Transportation Needs: No Transportation Needs (05/22/2024)   PRAPARE - Administrator, Civil Service (Medical): No    Lack of Transportation (Non-Medical): No  Physical Activity: Insufficiently Active (05/22/2024)   Exercise Vital Sign    Days of Exercise per Week: 5 days    Minutes of Exercise per Session: 20 min  Stress: No Stress  Concern Present (05/22/2024)   [employer] of Occupational Health - Occupational Stress Questionnaire    Feeling of Stress: Only a little  Social Connections: Moderately Integrated (05/22/2024)   Social Connection and Isolation Panel    Frequency of Communication with Friends and  Family: More than three times a week    Frequency of Social Gatherings with Friends and Family: Twice a week    Attends Religious Services: 1 to 4 times per year    Active Member of [employer] or Organizations: No    Attends Engineer, structural: Not on file    Marital Status: Living with partner    Family History  Problem Relation Age of Onset   Thyroid  disease Mother    Parkinson's disease Father    Colon cancer Neg Hx    Rectal cancer Neg Hx    Stomach cancer Neg Hx    Esophageal cancer Neg Hx     Past Medical History:  Diagnosis Date   Arthritis    in back   Atrial fibrillation (HCC)    Blood clot in vein 1995   bil legs   Bulging lumbar disc    Cataract    mild   Cellulitis 2012   forehead   Complication of anesthesia    had trouble waking up 06/11/2020   DDD (degenerative disc disease), lumbar    Diabetes mellitus without complication (HCC)    GERD (gastroesophageal reflux disease)    Graves disease    Hx of adenomatous colonic polyps 12/15/2017   Hyperlipidemia    Hypertension    Neuromuscular disorder (HCC)    neuropathy in feet   OSA on CPAP    Sleep apnea    Thyrotoxicosis without thyroid  storm    Varicose veins of both lower extremities     Past Surgical History:  Procedure Laterality Date   ANTERIOR CERVICAL DECOMP/DISCECTOMY FUSION N/A 06/18/2021   Procedure: ANTERIOR CERVICAL DECOMPRESSION FUSION CERVICAL4- CERVICAL 5 WITH INSTRUEMENTATION AND ALLOGRAFT;  Surgeon: Beuford Anes, MD;  Location: [facility] OR;  Service: Orthopedics;  Laterality: N/A;   ATRIAL FIBRILLATION ABLATION N/A 09/14/2022   Procedure: ATRIAL FIBRILLATION ABLATION;  Surgeon: Inocencio Soyla Lunger, MD;   Location: [facility] INVASIVE CV LAB;  Service: Cardiovascular;  Laterality: N/A;   BACK SURGERY  06/11/2020   BUBBLE STUDY  09/10/2022   Procedure: BUBBLE STUDY;  Surgeon: Santo Stanly LABOR, MD;  Location: [facility] ENDOSCOPY;  Service: Cardiovascular;;   CARDIOVERSION N/A 12/22/2021   Procedure: CARDIOVERSION;  Surgeon: Delford Maude BROCKS, MD;  Location: [facility] ENDOSCOPY;  Service: Cardiovascular;  Laterality: N/A;   CARDIOVERSION N/A 10/18/2022   Procedure: CARDIOVERSION;  Surgeon: Loni Soyla LABOR, MD;  Location: [facility] ENDOSCOPY;  Service: Cardiovascular;  Laterality: N/A;   ENDOVENOUS ABLATION SAPHENOUS VEIN W/ LASER Left 10/18/2019   endovenous laser ablation left greater saphenous vein and stab phlebectomy > 20 incisions left leg by Medford Blade MD    ENDOVENOUS ABLATION SAPHENOUS VEIN W/ LASER Right 11/08/2019   endovenous laser ablation right greater saphenous vein and stab phlebectomy 10-20 incisions right leg by Medford Blade MD    LUMBAR EPIDURAL INJECTION     SPINE SURGERY     TEE WITHOUT CARDIOVERSION N/A 12/22/2021   Procedure: TRANSESOPHAGEAL ECHOCARDIOGRAM (TEE);  Surgeon: Delford Maude BROCKS, MD;  Location: [facility] ENDOSCOPY;  Service: Cardiovascular;  Laterality: N/A;   TEE WITHOUT CARDIOVERSION N/A 09/10/2022   Procedure: TRANSESOPHAGEAL ECHOCARDIOGRAM (TEE);  Surgeon: Santo Stanly LABOR, MD;  Location: [facility] ENDOSCOPY;  Service: Cardiovascular;  Laterality: N/A;     Current Outpatient Medications on File Prior to Visit  Medication Sig Dispense Refill   acetaminophen  (TYLENOL ) 325 MG tablet Take 2 tablets (650 mg total) by mouth every 6 (six) hours as needed for mild pain (or Fever >/= 101).  apixaban  (ELIQUIS ) 5 MG TABS tablet Take 1 tablet (5 mg total) by mouth 2 (two) times daily. 180 tablet 1   atenolol  (TENORMIN ) 25 MG tablet Take 1 tablet (25 mg total) by mouth 2 (two) times daily. 180 tablet 3   atorvastatin  (LIPITOR) 20 MG tablet Take 1 tablet (20 mg total) by mouth daily. 90 tablet  1   Blood Gluc Meter Disp-Strips (BLOOD GLUCOSE METER DISPOSABLE) DEVI Use to check blood glucose TID 100 each 0   blood glucose meter kit and supplies KIT Dispense based on patient and insurance preference. Use up to four times daily as directed. 1 each 0   Blood Glucose Monitoring Suppl (FREESTYLE FREEDOM LITE) w/Device KIT Use to check blood sugar 3 times a day 1 kit 0   cetirizine (ZYRTEC) 10 MG tablet Take 10 mg by mouth daily as needed for allergies.     clotrimazole  (CLOTRIMAZOLE  AF) 1 % cream Apply topically twice daily as needed (rash). 45 g 2   clotrimazole -betamethasone  (LOTRISONE ) cream Apply 1 Application topically 2 (two) times daily. 30 g 1   Cyanocobalamin  (B-12 SL) Place 1 tablet under the tongue daily.     empagliflozin  (JARDIANCE ) 10 MG TABS tablet Take 1 tablet (10 mg total) by mouth daily before breakfast. 90 tablet 0   fenofibrate  (TRICOR ) 145 MG tablet Take 1 tablet by mouth once daily 90 tablet 1   folic acid  (FOLVITE ) 1 MG tablet Take 1 tablet (1 mg total) by mouth daily.     furosemide  (LASIX ) 80 MG tablet Take 1 tablet by mouth twice daily as needed 180 tablet 0   gabapentin  (NEURONTIN ) 800 MG tablet TAKE 1 TABLET BY MOUTH THREE TIMES DAILY 270 tablet 0   glucose blood (FREESTYLE LITE) test strip Use to check blood sugar 3 times a day 300 each 12   ketoconazole  (NIZORAL ) 2 % cream Apply 1 Application topically daily. 1 gram daily. 30 g 0   Lancet Devices MISC Use to check blood glucose TID 100 each 0   Lancets (FREESTYLE) lancets Use to check blood sugar 3 times a day 300 each 12   lidocaine -prilocaine  (EMLA ) cream Apply 1 Application topically as needed (apply to both feet  daily). 30 g 0   metFORMIN  (GLUCOPHAGE -XR) 500 MG 24 hr tablet Take 1 tablet (500 mg) by mouth daily with breakfast. 90 tablet 3   methimazole  (TAPAZOLE ) 5 MG tablet TAKE 1 TABLET BY MOUTH IN THE MORNING 30 tablet 0   methocarbamol  (ROBAXIN -750) 750 MG tablet Take 1 tablet (750 mg total) by mouth  every 8 (eight) hours as needed for muscle spasms. 90 tablet 1   pantoprazole  (PROTONIX ) 40 MG tablet TAKE 1 TABLET BY MOUTH ONCE DAILY . APPOINTMENT REQUIRED FOR FUTURE REFILLS 90 tablet 0   sildenafil  (VIAGRA ) 100 MG tablet Take 1/2-1 tablet by mouth daily as needed for erectile dysfunction. 30 tablet 2   tazarotene  (AVAGE ) 0.1 % cream Apply a thin layer to affected area(s) at nighttime 60 g 1   tirzepatide  (MOUNJARO ) 7.5 MG/0.5ML Pen Inject 7.5 mg into the skin once a week. 6 mL 1   tretinoin  (RETIN-A ) 0.05 % cream Apply 1 application  topically daily as needed (oil skin). 45 g 3   triamcinolone  cream (KENALOG ) 0.1 % Apply 1 Application topically 2 (two) times daily as needed (irritation). 15 g 2   No current facility-administered medications on file prior to visit.    No Known Allergies   DIAGNOSTIC DATA (LABS, IMAGING, TESTING) -  I reviewed patient records, labs, notes, testing and imaging myself where available.  Lab Results  Component Value Date   WBC 5.4 01/27/2024   HGB 14.9 01/27/2024   HCT 44.3 01/27/2024   MCV 89.1 01/27/2024   PLT 206.0 01/27/2024      Component Value Date/Time   NA 135 01/27/2024 1426   NA 136 04/29/2022 1222   K 3.7 01/27/2024 1426   CL 96 01/27/2024 1426   CO2 29 01/27/2024 1426   GLUCOSE 138 (H) 01/27/2024 1426   BUN 15 01/27/2024 1426   BUN 16 04/29/2022 1222   CREATININE 1.02 01/27/2024 1426   CREATININE 0.82 08/08/2020 1036   CALCIUM  9.1 01/27/2024 1426   PROT 7.8 10/14/2023 1032   PROT 7.4 11/29/2018 1715   ALBUMIN 4.4 10/14/2023 1032   ALBUMIN 4.4 11/29/2018 1715   AST 39 (H) 10/14/2023 1032   ALT 46 10/14/2023 1032   ALKPHOS 105 10/14/2023 1032   BILITOT 0.6 10/14/2023 1032   BILITOT 0.4 11/29/2018 1715   GFRNONAA >60 09/14/2022 1232   GFRAA >60 06/06/2020 1156   Lab Results  Component Value Date   CHOL 143 10/14/2023   HDL 38.80 (L) 10/14/2023   LDLCALC 68 10/14/2023   LDLDIRECT 71.0 10/13/2022   TRIG 179.0 (H) 10/14/2023    CHOLHDL 4 10/14/2023   Lab Results  Component Value Date   HGBA1C 6.3 03/29/2024   Lab Results  Component Value Date   VITAMINB12 643 01/06/2022   Lab Results  Component Value Date   TSH 2.72 10/14/2023    PHYSICAL EXAM:  Vitals:   06/05/24 1056  BP: 108/60  Pulse: 66   No data found. Body mass index is 31.46 kg/m.   Wt Readings from Last 3 Encounters:  06/05/24 245 lb (111.1 kg)  05/23/24 243 lb (110.2 kg)  03/20/24 254 lb (115.2 kg)     Ht Readings from Last 3 Encounters:  06/05/24 6' 2 (1.88 m)  05/23/24 6' 2 (1.88 m)  03/20/24 6' 2 (1.88 m)      General: The patient is awake, alert and appears not in acute distress and groomed. Head: Normocephalic, atraumatic.  Neck is supple.Neck is supple.  Mallampati 2 - 3 peaked and narrow palate.   neck circumference:20 inches   Nasal airflow was partially patent.  Rhinophyma noted.  Retrognathia is not seen.  Dental status: intact Cardiovascular:  Regular rate and cardiac rhythm by pulse,  without distended neck veins. Respiratory: Lungs are clear to auscultation.  Skin:  With evidence of ankle edema- more on the right foot  than left-   red palms and red feet, flushed face.    Neurologic exam : The patient is awake and alert, oriented to place and time.   Memory subjective described as intact.  Attention span & concentration ability appears normal.  Speech is fluent,  with, dysphonia.  Mood and affect are appropriate.   Cranial nerves: no loss of smell or taste reported  Pupils are equal and not reactive to light. 2-3 mm wide.  Funduscopic exam deferred. .  Extraocular movements in vertical and horizontal planes were intact and without nystagmus. No Diplopia. Visual fields by finger perimetry are intact.  Hearing was impaired to soft voice and finger rubbing.  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   Facial sensation intact to fine touch.  Facial motor strength is symmetric/ tongue and uvula move midline.   Neck ROM : rotation, tilt and flexion extension were normal for age and  shoulder shrug was symmetrical.    Motor exam:  Symmetric bulk, tone and ROM.   Normal tone without cog wheeling, symmetrically affected grip strength . Carpaltunnel dx.    Sensory:  Fine touch, and vibration were tested  and  severely decreased in both feet- diabetic neuropathy.   Vibration was not felt on left foot at all. Feet an hands tingling- this is neuropathy in his hands. THIS is ascending and is basically the same examination result since 2022.  Proprioception tested in the upper extremities was normal.   Coordination: Rapid alternating movements in the fingers/hands were of normal speed.  The Finger-to-nose maneuver was intact without evidence of ataxia, dysmetria or tremor.   Gait and station: Patient could rise unassisted from a seated position, walked without assistive device. Left arm less arm swing and limp- left sciatic pain.  Stance is of normal width/ base and the patient turned with 5 steps.  Toe and heel walk were deferred.  Deep tendon reflexes: in the upper extremities are absent in both legs.     ASSESSMENT AND PLAN :   68 y.o. year old male  here with:    1) OSA : CPAP compliance, which has dropped form excellent last year to sporadic this year.  New mask and headgear needed.   2) co-morbidities that influence apnea and hypoxia are history of smoking, there have been changes seen in the CT of the lungs.  The patient has an aortic aneurysm which is monitored, carotid artery disease coronary artery disease and atrial fibrillation history,   3)  Neuropathy : progressive diabetes has affected the neuropathy, kidney function and he needs regular eye exams ( Dr [place]) .  4)  had been on prednisone  last months with spinal stenosis, sciatica treatment.  This is  detrimental to his DM.     Plan : continue CPAP use, I will order a in lab study or HST to see if your baseline is changed, if you  have hypoxia, central apnea etc.   Air-leak needs correction.  I ordered new supplies in the interval.   RV will be in 12 months unless there are new findings on the tests.   I will be happy to support a change to lyrica  if the gabapentin  and EMLA  cream  doesn't help.      I would like to thank Merna Huxley, NP  for allowing me to meet with this pleasant patient.   Sleep Clinic Patients are generally offered input on sleep hygiene, life style changes and how to improve compliance with medical treatment where applicable. Review and reiteration of good sleep hygiene measures is offered to any sleep clinic patient, be it in the first consultation or with any follow up visits.  Any CPAP patient should be reminded to be fully compliant with PAP therapy , (defined as using PAP therapy for more than 4 hours each night ) with the goal to improve sleep related symptoms and decrease long term cardiovascular risks. Any PAP therapy patient should be reminded, that it may take up to 3 months to get fully used to using PAP and it may take 1-2 weeks for an established CPAP user to acclimatize to changes in pressure or mask. The earlier full compliance is achieved, the better long term compliance tends to be.   Please note that untreated obstructive sleep apnea may carry additional perioperative morbidity. Patients with significant obstructive sleep apnea should receive perioperative PAP therapy and the surgical team should be informed of  the diagnosis and degree of sleep disordered breathing.  Sleep fragmentation in the presence of normal proportional sleep stages is a nonspecific findings and per se does not signify an intrinsic sleep disorder or a cause for the patient's sleep-related symptoms.  Causes include (but are not limited to) the unfamiliarity of sleeping while recorded by HST device or sleeping in a sleep lab for a full Polysomnography sleep study, but also circadian rhythm disturbances,  medication side effects or an underlying mood disorder or medical problem.    Any patient with sleepiness should be cautioned not to drive, work at heights, or operate dangerous or heavy equipment when feeling tired or sleepy.      The patient will be seen in follow-up in the sleep clinic at [facility] for discussion of test results, sleep related symptoms and treatment compliance review, further management strategies, etc.   The referring provider will be notified of the test results.   The patient's condition requires frequent monitoring and adjustments in the treatment plan, reflecting the ongoing complexity of care.  This provider is the continuing focal point for all needed services for this condition.  After spending a total time of  35  minutes face to face and time for  history taking, physical and neurologic examination, review of laboratory studies,  personal review of imaging studies, reports and results of other testing and review of referral information / records as far as provided in visit,   Electronically signed by: Dedra Gores, MD 06/05/2024 11:21 AM  [facility] Board certified by The [employer] of Sleep Medicine and Diplomate of the [employer] of Sleep Medicine. Board certified In Neurology through the ABPN, Fellow of the [employer] of Neurology.

## 2024-06-05 NOTE — Patient Instructions (Signed)
  68 y.o. year old male patient here with:    1) OSA : CPAP compliance, which has dropped form excellent last year to sporadic this year.  New mask and headgear needed.   2) co-morbidities that influence apnea and hypoxia are history of smoking, there have been changes seen in the CT of the lungs.  The patient has an aortic aneurysm which is monitored, carotid artery disease coronary artery disease and atrial fibrillation history,   3)  Neuropathy : progressive diabetes has affected the neuropathy, kidney function and he needs regular eye exams ( Dr [place]) .  4)  double crush , neuropathy and orthopedic injuries,  had been on prednisone  last months with spinal stenosis, sciatica treatment.  This is  detrimental to his DM.     Plan : continue CPAP use, I will order a in lab study or HST to see if your baseline is changed, if you have hypoxia, central apnea etc.   Air-leak needs correction.  I ordered new supplies in the interval.   RV will be in 12 months unless there are new findings on the tests.   I will be happy to support a change to lyrica  if the gabapentin  and EMLA  cream  doesn't help.

## 2024-06-06 ENCOUNTER — Other Ambulatory Visit (HOSPITAL_COMMUNITY): Payer: Self-pay

## 2024-06-06 ENCOUNTER — Encounter: Payer: Self-pay | Admitting: Adult Health

## 2024-06-06 ENCOUNTER — Telehealth: Payer: Self-pay | Admitting: *Deleted

## 2024-06-06 ENCOUNTER — Ambulatory Visit (INDEPENDENT_AMBULATORY_CARE_PROVIDER_SITE_OTHER): Payer: Medicare (Managed Care) | Admitting: Adult Health

## 2024-06-06 VITALS — BP 100/60 | HR 69 | Temp 97.8°F | Ht 74.0 in | Wt 239.0 lb

## 2024-06-06 DIAGNOSIS — Z8739 Personal history of other diseases of the musculoskeletal system and connective tissue: Secondary | ICD-10-CM | POA: Diagnosis not present

## 2024-06-06 DIAGNOSIS — M5416 Radiculopathy, lumbar region: Secondary | ICD-10-CM

## 2024-06-06 NOTE — Telephone Encounter (Signed)
 Pt has been scheduled for tele preop appt 06/28/24. Pt said he is trying to have surgery early to mis sept, so he wanted to get this clearance done so they can schedule surgery.   Med rec and consent are done.     Patient Consent for Virtual Visit        Jeffrey Price has provided verbal consent on 06/06/2024 for a virtual visit (video or telephone).   CONSENT FOR VIRTUAL VISIT FOR:  Jeffrey Price  By participating in this virtual visit I agree to the following:  I hereby voluntarily request, consent and authorize [facility] and its employed or contracted physicians, physician assistants, nurse practitioners or other licensed health care professionals (the Practitioner), to provide me with telemedicine health care services (the "Services) as deemed necessary by the treating Practitioner. I acknowledge and consent to receive the Services by the Practitioner via telemedicine. I understand that the telemedicine visit will involve communicating with the Practitioner through live audiovisual communication technology and the disclosure of certain medical information by electronic transmission. I acknowledge that I have been given the opportunity to request an in-person assessment or other available alternative prior to the telemedicine visit and am voluntarily participating in the telemedicine visit.  I understand that I have the right to withhold or withdraw my consent to the use of telemedicine in the course of my care at any time, without affecting my right to future care or treatment, and that the Practitioner or I may terminate the telemedicine visit at any time. I understand that I have the right to inspect all information obtained and/or recorded in the course of the telemedicine visit and may receive copies of available information for a reasonable fee.  I understand that some of the potential risks of receiving the Services via telemedicine include:  Delay or interruption in medical  evaluation due to technological equipment failure or disruption; Information transmitted may not be sufficient (e.g. poor resolution of images) to allow for appropriate medical decision making by the Practitioner; and/or  In rare instances, security protocols could fail, causing a breach of personal health information.  Furthermore, I acknowledge that it is my responsibility to provide information about my medical history, conditions and care that is complete and accurate to the best of my ability. I acknowledge that Practitioner's advice, recommendations, and/or decision may be based on factors not within their control, such as incomplete or inaccurate data provided by me or distortions of diagnostic images or specimens that may result from electronic transmissions. I understand that the practice of medicine is not an exact science and that Practitioner makes no warranties or guarantees regarding treatment outcomes. I acknowledge that a copy of this consent can be made available to me via my patient portal [facility] MyChart), or I can request a printed copy by calling the office of [facility].    I understand that my insurance will be billed for this visit.   I have read or had this consent read to me. I understand the contents of this consent, which adequately explains the benefits and risks of the Services being provided via telemedicine.  I have been provided ample opportunity to ask questions regarding this consent and the Services and have had my questions answered to my satisfaction. I give my informed consent for the services to be provided through the use of telemedicine in my medical care

## 2024-06-06 NOTE — Telephone Encounter (Signed)
 Pt has been scheduled for tele preop appt 06/28/24. Pt said he is trying to have surgery early to mis sept, so he wanted to get this clearance done so they can schedule surgery.   Med rec and consent are done.

## 2024-06-06 NOTE — Progress Notes (Signed)
 Subjective:    Patient ID: Jeffrey Price, male    DOB: 1956-09-07, 68 y.o.   MRN: [id number]  HPI  46 male who is being evaluated today for follow-up on bilateral leg pain.  He was seen by another provider in the office roughly 2 weeks.  ago.  This is a recurrent problem that was gradually worsening.  Pain was present in the lumbar spine.  His symptoms were aggravated by bending, position changes, sitting, and standing.  Sodi of symptoms included leg pain and paresthesias he has.  He did not have any abdominal pain, bladder incontinence, bowel incontinence, chest pain, dysuria, fever, numbness, pelvic pain, perianal numbness, weakness or weight loss past baseline.  He had tried NSAIDs and a muscle relaxer for his symptoms but this did not seem to help.  It was thought that his pain was more radicular in nature he was prescribed a steroid taper and Percocet.  He reports that he is feeling much better does not really have any lower back pain and his bilateral leg pain has resolved.  Review of Systems See HPI   Past Medical History:  Diagnosis Date   Arthritis    in back   Atrial fibrillation (HCC)    Blood clot in vein 1995   bil legs   Bulging lumbar disc    Cataract    mild   Cellulitis 2012   forehead   Complication of anesthesia    had trouble waking up 06/11/2020   DDD (degenerative disc disease), lumbar    Diabetes mellitus without complication (HCC)    GERD (gastroesophageal reflux disease)    Graves disease    Hx of adenomatous colonic polyps 12/15/2017   Hyperlipidemia    Hypertension    Neuromuscular disorder (HCC)    neuropathy in feet   OSA on CPAP    Sleep apnea    Thyrotoxicosis without thyroid  storm    Varicose veins of both lower extremities     Social History   Socioeconomic History   Marital status: Significant Other    Spouse name: Not on file   Number of children: 1   Years of education: Not on file   Highest education level: 12th grade   Occupational History   Occupation: Retiring  Tobacco Use   Smoking status: Former    Current packs/day: 0.00    Types: E-cigarettes, Cigarettes    Quit date: 12/03/2011    Years since quitting: 12.5   Smokeless tobacco: Never   Tobacco comments:    Former smoker 10/12/22  Vaping Use   Vaping status: Every Day   Substances: Nicotine , Flavoring  Substance and Sexual Activity   Alcohol use: Yes    Comment: whiskey, Burbon, as few drinks per night and more on weekends 10/12/22   Drug use: No   Sexual activity: Not on file  Other Topics Concern   Not on file  Social History Narrative   Patient is widowed.    He has girlfriend.   Right-handed.   Caffeine use: 2-3 cups per day.      Social Drivers of Corporate investment banker Strain: Low Risk  (05/22/2024)   Overall Financial Resource Strain (CARDIA)    Difficulty of Paying Living Expenses: Not hard at all  Food Insecurity: No Food Insecurity (05/22/2024)   Hunger Vital Sign    Worried About Running Out of Food in the Last Year: Never true    Ran Out of Food in the Last Year:  Never true  Transportation Needs: No Transportation Needs (05/22/2024)   PRAPARE - Administrator, Civil Service (Medical): No    Lack of Transportation (Non-Medical): No  Physical Activity: Insufficiently Active (05/22/2024)   Exercise Vital Sign    Days of Exercise per Week: 5 days    Minutes of Exercise per Session: 20 min  Stress: No Stress Concern Present (05/22/2024)   [employer] of Occupational Health - Occupational Stress Questionnaire    Feeling of Stress: Only a little  Social Connections: Moderately Integrated (05/22/2024)   Social Connection and Isolation Panel    Frequency of Communication with Friends and Family: More than three times a week    Frequency of Social Gatherings with Friends and Family: Twice a week    Attends Religious Services: 1 to 4 times per year    Active Member of [employer] or Organizations: No     Attends Banker Meetings: Not on file    Marital Status: Living with partner  Intimate Partner Violence: Not At Risk (03/29/2024)   Humiliation, Afraid, Rape, and Kick questionnaire    Fear of Current or Ex-Partner: No    Emotionally Abused: No    Physically Abused: No    Sexually Abused: No    Past Surgical History:  Procedure Laterality Date   ANTERIOR CERVICAL DECOMP/DISCECTOMY FUSION N/A 06/18/2021   Procedure: ANTERIOR CERVICAL DECOMPRESSION FUSION CERVICAL4- CERVICAL 5 WITH INSTRUEMENTATION AND ALLOGRAFT;  Surgeon: Beuford Anes, MD;  Location: [facility] OR;  Service: Orthopedics;  Laterality: N/A;   ATRIAL FIBRILLATION ABLATION N/A 09/14/2022   Procedure: ATRIAL FIBRILLATION ABLATION;  Surgeon: Inocencio Soyla Lunger, MD;  Location: [facility] INVASIVE CV LAB;  Service: Cardiovascular;  Laterality: N/A;   BACK SURGERY  06/11/2020   BUBBLE STUDY  09/10/2022   Procedure: BUBBLE STUDY;  Surgeon: Santo Stanly LABOR, MD;  Location: [facility] ENDOSCOPY;  Service: Cardiovascular;;   CARDIOVERSION N/A 12/22/2021   Procedure: CARDIOVERSION;  Surgeon: Delford Maude BROCKS, MD;  Location: [facility] ENDOSCOPY;  Service: Cardiovascular;  Laterality: N/A;   CARDIOVERSION N/A 10/18/2022   Procedure: CARDIOVERSION;  Surgeon: Loni Soyla LABOR, MD;  Location: [facility] ENDOSCOPY;  Service: Cardiovascular;  Laterality: N/A;   ENDOVENOUS ABLATION SAPHENOUS VEIN W/ LASER Left 10/18/2019   endovenous laser ablation left greater saphenous vein and stab phlebectomy > 20 incisions left leg by Medford Blade MD    ENDOVENOUS ABLATION SAPHENOUS VEIN W/ LASER Right 11/08/2019   endovenous laser ablation right greater saphenous vein and stab phlebectomy 10-20 incisions right leg by Medford Blade MD    LUMBAR EPIDURAL INJECTION     SPINE SURGERY     TEE WITHOUT CARDIOVERSION N/A 12/22/2021   Procedure: TRANSESOPHAGEAL ECHOCARDIOGRAM (TEE);  Surgeon: Delford Maude BROCKS, MD;  Location: [facility] ENDOSCOPY;  Service: Cardiovascular;  Laterality:  N/A;   TEE WITHOUT CARDIOVERSION N/A 09/10/2022   Procedure: TRANSESOPHAGEAL ECHOCARDIOGRAM (TEE);  Surgeon: Santo Stanly LABOR, MD;  Location: [facility] ENDOSCOPY;  Service: Cardiovascular;  Laterality: N/A;    Family History  Problem Relation Age of Onset   Thyroid  disease Mother    Parkinson's disease Father    Colon cancer Neg Hx    Rectal cancer Neg Hx    Stomach cancer Neg Hx    Esophageal cancer Neg Hx     No Known Allergies  Current Outpatient Medications on File Prior to Visit  Medication Sig Dispense Refill   acetaminophen  (TYLENOL ) 325 MG tablet Take 2 tablets (650 mg total) by mouth every 6 (six) hours as  needed for mild pain (or Fever >/= 101).     apixaban  (ELIQUIS ) 5 MG TABS tablet Take 1 tablet (5 mg total) by mouth 2 (two) times daily. 180 tablet 1   atenolol  (TENORMIN ) 25 MG tablet Take 1 tablet (25 mg total) by mouth 2 (two) times daily. 180 tablet 3   atorvastatin  (LIPITOR) 20 MG tablet Take 1 tablet (20 mg total) by mouth daily. 90 tablet 1   Blood Gluc Meter Disp-Strips (BLOOD GLUCOSE METER DISPOSABLE) DEVI Use to check blood glucose TID 100 each 0   blood glucose meter kit and supplies KIT Dispense based on patient and insurance preference. Use up to four times daily as directed. 1 each 0   Blood Glucose Monitoring Suppl (FREESTYLE FREEDOM LITE) w/Device KIT Use to check blood sugar 3 times a day 1 kit 0   cetirizine (ZYRTEC) 10 MG tablet Take 10 mg by mouth daily as needed for allergies.     clotrimazole  (CLOTRIMAZOLE  AF) 1 % cream Apply topically twice daily as needed (rash). 45 g 2   clotrimazole -betamethasone  (LOTRISONE ) cream Apply 1 Application topically 2 (two) times daily. 30 g 1   Cyanocobalamin  (B-12 SL) Place 1 tablet under the tongue daily.     empagliflozin  (JARDIANCE ) 10 MG TABS tablet Take 1 tablet (10 mg total) by mouth daily before breakfast. 90 tablet 0   fenofibrate  (TRICOR ) 145 MG tablet Take 1 tablet by mouth once daily 90 tablet 1   folic  acid (FOLVITE ) 1 MG tablet Take 1 tablet (1 mg total) by mouth daily.     furosemide  (LASIX ) 80 MG tablet Take 1 tablet by mouth twice daily as needed 180 tablet 0   gabapentin  (NEURONTIN ) 800 MG tablet Take 1 tablet (800 mg total) by mouth 2 (two) times daily. 270 tablet 0   glucose blood (FREESTYLE LITE) test strip Use to check blood sugar 3 times a day 300 each 12   ketoconazole  (NIZORAL ) 2 % cream Apply 1 Application topically daily. 1 gram daily. 30 g 0   Lancet Devices MISC Use to check blood glucose TID 100 each 0   Lancets (FREESTYLE) lancets Use to check blood sugar 3 times a day 300 each 12   lidocaine -prilocaine  (EMLA ) cream Apply 1 Application topically as needed (apply to both feet  daily). 30 g 0   metFORMIN  (GLUCOPHAGE -XR) 500 MG 24 hr tablet Take 1 tablet (500 mg) by mouth daily with breakfast. 90 tablet 3   methimazole  (TAPAZOLE ) 5 MG tablet TAKE 1 TABLET BY MOUTH IN THE MORNING 30 tablet 0   methocarbamol  (ROBAXIN -750) 750 MG tablet Take 1 tablet (750 mg total) by mouth every 8 (eight) hours as needed for muscle spasms. 90 tablet 1   pantoprazole  (PROTONIX ) 40 MG tablet TAKE 1 TABLET BY MOUTH ONCE DAILY . APPOINTMENT REQUIRED FOR FUTURE REFILLS 90 tablet 0   sildenafil  (VIAGRA ) 100 MG tablet Take 1/2-1 tablet by mouth daily as needed for erectile dysfunction. 30 tablet 2   tazarotene  (AVAGE ) 0.1 % cream Apply a thin layer to affected area(s) at nighttime 60 g 1   tirzepatide  (MOUNJARO ) 7.5 MG/0.5ML Pen Inject 7.5 mg into the skin once a week. 6 mL 1   tretinoin  (RETIN-A ) 0.05 % cream Apply 1 application  topically daily as needed (oil skin). 45 g 3   triamcinolone  cream (KENALOG ) 0.1 % Apply 1 Application topically 2 (two) times daily as needed (irritation). 15 g 2   No current facility-administered medications on file prior to  visit.    BP 100/60   Pulse 69   Temp 97.8 F (36.6 C) (Oral)   Ht 6' 2 (1.88 m)   Wt 239 lb (108.4 kg)   SpO2 97%   BMI 30.69 kg/m        Objective:   Physical Exam Vitals and nursing note reviewed.  Constitutional:      Appearance: Normal appearance. He is obese.  Cardiovascular:     Rate and Rhythm: Normal rate and regular rhythm.     Pulses: Normal pulses.     Heart sounds: Normal heart sounds.  Pulmonary:     Effort: Pulmonary effort is normal.     Breath sounds: Normal breath sounds.  Musculoskeletal:        General: Normal range of motion.  Skin:    General: Skin is warm and dry.  Neurological:     General: No focal deficit present.     Mental Status: He is alert and oriented to person, place, and time.  Psychiatric:        Mood and Affect: Mood normal.        Behavior: Behavior normal.        Thought Content: Thought content normal.        Judgment: Judgment normal.       Assessment & Plan:  1. Lumbar radiculopathy (Primary) - Resolved. - Follow up as needed  Shanita Kanan, NP

## 2024-06-07 ENCOUNTER — Encounter: Payer: Self-pay | Admitting: Internal Medicine

## 2024-06-07 LAB — HEMOGLOBIN A1C: Hemoglobin A1C: 6

## 2024-06-07 NOTE — Progress Notes (Signed)
 No sdoh needs

## 2024-06-07 NOTE — Progress Notes (Signed)
 Sixto Bowdish D, CMA  Joylene Bradley; Garcia, Patricia; Ziegler, Melissa; Tucker, Dolanda; Cain, Stanley New orders have been placed for the above pt, DOB: 05-Oct-2056 Thanks

## 2024-06-10 ENCOUNTER — Encounter: Payer: Self-pay | Admitting: Adult Health

## 2024-06-12 ENCOUNTER — Ambulatory Visit: Payer: Self-pay

## 2024-06-12 NOTE — Telephone Encounter (Signed)
 Summary: Respiratory Infection   Patient sent in a message to Mychart to Dr. Darleene but didn't get a response. He states to be experiencing the same symptoms as his spouse and is requesting if he could get a script called in or if he needs to come in to be checked out. [phone] (M)

## 2024-06-12 NOTE — Telephone Encounter (Signed)
 FYI Only or Action Required?: FYI only for provider.  Patient was last seen in primary care on 06/06/2024 by Merna Huxley, NP.  Called Nurse Triage reporting Cough.  Symptoms began several days ago.  Symptoms are: gradually worsening.  Triage Disposition: See Physician Within 24 Hours  Patient/caregiver understands and will follow disposition?: Yes       Summary: Respiratory Infection   Patient sent in a message to Mychart to Dr. Huxley but didn't get a response. He states to be experiencing the same symptoms as his spouse and is requesting if he could get a script called in or if he needs to come in to be checked out. [phone] (M)          Reason for Disposition  [1] Known COPD or other severe lung disease (i.e., bronchiectasis, cystic fibrosis, lung surgery) AND [2] symptoms getting worse (i.e., increased sputum purulence or amount, increased breathing difficulty  Answer Assessment - Initial Assessment Questions 1. ONSET: When did the cough begin?      4-5 days ago  2. SEVERITY: How bad is the cough today?      Moderate  3. SPUTUM: Describe the color of your sputum (e.g., none, dry cough; clear, white, yellow, green)     Tan 4. HEMOPTYSIS: Are you coughing up any blood? If Yes, ask: How much? (e.g., flecks, streaks, tablespoons, etc.)     No 5. DIFFICULTY BREATHING: Are you having difficulty breathing? If Yes, ask: How bad is it? (e.g., mild, moderate, severe)      No 6. FEVER: Do you have a fever? If Yes, ask: What is your temperature, how was it measured, and when did it start?     No 7. CARDIAC HISTORY: Do you have any history of heart disease? (e.g., heart attack, congestive heart failure)      Yes 8. LUNG HISTORY: Do you have any history of lung disease?  (e.g., pulmonary embolus, asthma, emphysema)     Yes 9. PE RISK FACTORS: Do you have a history of blood clots? (or: recent major surgery, recent prolonged travel, bedridden)      No 10. OTHER SYMPTOMS: Do you have any other symptoms? (e.g., runny nose, wheezing, chest pain)       Sore throat, nasal congestion  Protocols used: Cough - Acute Productive-A-AH

## 2024-06-13 ENCOUNTER — Encounter: Payer: Self-pay | Admitting: Adult Health

## 2024-06-13 ENCOUNTER — Ambulatory Visit (INDEPENDENT_AMBULATORY_CARE_PROVIDER_SITE_OTHER): Payer: Medicare (Managed Care) | Admitting: Adult Health

## 2024-06-13 VITALS — BP 102/64 | HR 69 | Temp 98.0°F | Wt 241.6 lb

## 2024-06-13 DIAGNOSIS — R051 Acute cough: Secondary | ICD-10-CM | POA: Diagnosis not present

## 2024-06-13 DIAGNOSIS — J988 Other specified respiratory disorders: Secondary | ICD-10-CM | POA: Diagnosis not present

## 2024-06-13 MED ORDER — HYDROCODONE BIT-HOMATROP MBR 5-1.5 MG/5ML PO SOLN: 5.0000 mL | Freq: Three times a day (TID) | ORAL | 0 refills | Status: DC | PRN

## 2024-06-13 MED ORDER — ALBUTEROL SULFATE HFA 108 (90 BASE) MCG/ACT IN AERS: 2.0000 | INHALATION_SPRAY | Freq: Four times a day (QID) | RESPIRATORY_TRACT | 0 refills | Status: AC | PRN

## 2024-06-13 MED ORDER — DOXYCYCLINE HYCLATE 100 MG PO CAPS: 100.0000 mg | ORAL_CAPSULE | Freq: Two times a day (BID) | ORAL | 0 refills | Status: DC

## 2024-06-13 NOTE — Progress Notes (Signed)
 Subjective:    Patient ID: Jeffrey Price, male    DOB: 08-03-1956, 68 y.o.   MRN: [id number]  Cough    68 year old male who  has a past medical history of Arthritis, Atrial fibrillation (HCC), Blood clot in vein (1995), Bulging lumbar disc, Cataract, Cellulitis (2012), Complication of anesthesia, DDD (degenerative disc disease), lumbar, Diabetes mellitus without complication (HCC), GERD (gastroesophageal reflux disease), Graves disease, adenomatous colonic polyps (12/15/2017), Hyperlipidemia, Hypertension, Neuromuscular disorder (HCC), OSA on CPAP, Sleep apnea, Thyrotoxicosis without thyroid  storm, and Varicose veins of both lower extremities.  He presents to the office today for an acute visit. He reports that he has for the last 5 days he has had a dry cough with chest congestion, mild wheezing and shortness of breath,  sinus congestion with pressure, low grade fever and chills. He has been using OTC coughh medication which has helped.   Review of Systems  Respiratory:  Positive for cough.    See HPI   Past Medical History:  Diagnosis Date   Arthritis    in back   Atrial fibrillation (HCC)    Blood clot in vein 1995   bil legs   Bulging lumbar disc    Cataract    mild   Cellulitis 2012   forehead   Complication of anesthesia    had trouble waking up 06/11/2020   DDD (degenerative disc disease), lumbar    Diabetes mellitus without complication (HCC)    GERD (gastroesophageal reflux disease)    Graves disease    Hx of adenomatous colonic polyps 12/15/2017   Hyperlipidemia    Hypertension    Neuromuscular disorder (HCC)    neuropathy in feet   OSA on CPAP    Sleep apnea    Thyrotoxicosis without thyroid  storm    Varicose veins of both lower extremities     Social History   Socioeconomic History   Marital status: Significant Other    Spouse name: Not on file   Number of children: 1   Years of education: Not on file   Highest education level: 12th grade   Occupational History   Occupation: Retiring  Tobacco Use   Smoking status: Former    Current packs/day: 0.00    Types: E-cigarettes, Cigarettes    Quit date: 12/03/2011    Years since quitting: 12.5   Smokeless tobacco: Never   Tobacco comments:    Former smoker 10/12/22  Vaping Use   Vaping status: Every Day   Substances: Nicotine , Flavoring  Substance and Sexual Activity   Alcohol use: Yes    Comment: whiskey, Burbon, as few drinks per night and more on weekends 10/12/22   Drug use: No   Sexual activity: Not on file  Other Topics Concern   Not on file  Social History Narrative   Patient is widowed.    He has girlfriend.   Right-handed.   Caffeine use: 2-3 cups per day.      Social Drivers of Corporate investment banker Strain: Low Risk  (05/22/2024)   Overall Financial Resource Strain (CARDIA)    Difficulty of Paying Living Expenses: Not hard at all  Food Insecurity: No Food Insecurity (06/07/2024)   Hunger Vital Sign    Worried About Running Out of Food in the Last Year: Never true    Ran Out of Food in the Last Year: Never true  Transportation Needs: No Transportation Needs (06/07/2024)   PRAPARE - Administrator, Civil Service (  Medical): No    Lack of Transportation (Non-Medical): No  Physical Activity: Insufficiently Active (05/22/2024)   Exercise Vital Sign    Days of Exercise per Week: 5 days    Minutes of Exercise per Session: 20 min  Stress: No Stress Concern Present (05/22/2024)   [employer] of Occupational Health - Occupational Stress Questionnaire    Feeling of Stress: Only a little  Social Connections: Moderately Integrated (05/22/2024)   Social Connection and Isolation Panel    Frequency of Communication with Friends and Family: More than three times a week    Frequency of Social Gatherings with Friends and Family: Twice a week    Attends Religious Services: 1 to 4 times per year    Active Member of [employer] or Organizations: No     Attends Banker Meetings: Not on file    Marital Status: Living with partner  Intimate Partner Violence: Not At Risk (06/07/2024)   Humiliation, Afraid, Rape, and Kick questionnaire    Fear of Current or Ex-Partner: No    Emotionally Abused: No    Physically Abused: No    Sexually Abused: No    Past Surgical History:  Procedure Laterality Date   ANTERIOR CERVICAL DECOMP/DISCECTOMY FUSION N/A 06/18/2021   Procedure: ANTERIOR CERVICAL DECOMPRESSION FUSION CERVICAL4- CERVICAL 5 WITH INSTRUEMENTATION AND ALLOGRAFT;  Surgeon: Beuford Anes, MD;  Location: [facility] OR;  Service: Orthopedics;  Laterality: N/A;   ATRIAL FIBRILLATION ABLATION N/A 09/14/2022   Procedure: ATRIAL FIBRILLATION ABLATION;  Surgeon: Inocencio Soyla Lunger, MD;  Location: [facility] INVASIVE CV LAB;  Service: Cardiovascular;  Laterality: N/A;   BACK SURGERY  06/11/2020   BUBBLE STUDY  09/10/2022   Procedure: BUBBLE STUDY;  Surgeon: Santo Stanly LABOR, MD;  Location: [facility] ENDOSCOPY;  Service: Cardiovascular;;   CARDIOVERSION N/A 12/22/2021   Procedure: CARDIOVERSION;  Surgeon: Delford Maude BROCKS, MD;  Location: [facility] ENDOSCOPY;  Service: Cardiovascular;  Laterality: N/A;   CARDIOVERSION N/A 10/18/2022   Procedure: CARDIOVERSION;  Surgeon: Loni Soyla LABOR, MD;  Location: [facility] ENDOSCOPY;  Service: Cardiovascular;  Laterality: N/A;   ENDOVENOUS ABLATION SAPHENOUS VEIN W/ LASER Left 10/18/2019   endovenous laser ablation left greater saphenous vein and stab phlebectomy > 20 incisions left leg by Medford Blade MD    ENDOVENOUS ABLATION SAPHENOUS VEIN W/ LASER Right 11/08/2019   endovenous laser ablation right greater saphenous vein and stab phlebectomy 10-20 incisions right leg by Medford Blade MD    LUMBAR EPIDURAL INJECTION     SPINE SURGERY     TEE WITHOUT CARDIOVERSION N/A 12/22/2021   Procedure: TRANSESOPHAGEAL ECHOCARDIOGRAM (TEE);  Surgeon: Delford Maude BROCKS, MD;  Location: [facility] ENDOSCOPY;  Service: Cardiovascular;  Laterality:  N/A;   TEE WITHOUT CARDIOVERSION N/A 09/10/2022   Procedure: TRANSESOPHAGEAL ECHOCARDIOGRAM (TEE);  Surgeon: Santo Stanly LABOR, MD;  Location: [facility] ENDOSCOPY;  Service: Cardiovascular;  Laterality: N/A;    Family History  Problem Relation Age of Onset   Thyroid  disease Mother    Parkinson's disease Father    Colon cancer Neg Hx    Rectal cancer Neg Hx    Stomach cancer Neg Hx    Esophageal cancer Neg Hx     No Known Allergies  Current Outpatient Medications on File Prior to Visit  Medication Sig Dispense Refill   acetaminophen  (TYLENOL ) 325 MG tablet Take 2 tablets (650 mg total) by mouth every 6 (six) hours as needed for mild pain (or Fever >/= 101).     apixaban  (ELIQUIS ) 5 MG TABS tablet Take 1  tablet (5 mg total) by mouth 2 (two) times daily. 180 tablet 1   atenolol  (TENORMIN ) 25 MG tablet Take 1 tablet (25 mg total) by mouth 2 (two) times daily. 180 tablet 3   atorvastatin  (LIPITOR) 20 MG tablet Take 1 tablet (20 mg total) by mouth daily. 90 tablet 1   Blood Gluc Meter Disp-Strips (BLOOD GLUCOSE METER DISPOSABLE) DEVI Use to check blood glucose TID 100 each 0   blood glucose meter kit and supplies KIT Dispense based on patient and insurance preference. Use up to four times daily as directed. 1 each 0   Blood Glucose Monitoring Suppl (FREESTYLE FREEDOM LITE) w/Device KIT Use to check blood sugar 3 times a day 1 kit 0   cetirizine (ZYRTEC) 10 MG tablet Take 10 mg by mouth daily as needed for allergies.     clotrimazole  (CLOTRIMAZOLE  AF) 1 % cream Apply topically twice daily as needed (rash). 45 g 2   clotrimazole -betamethasone  (LOTRISONE ) cream Apply 1 Application topically 2 (two) times daily. 30 g 1   Cyanocobalamin  (B-12 SL) Place 1 tablet under the tongue daily.     empagliflozin  (JARDIANCE ) 10 MG TABS tablet Take 1 tablet (10 mg total) by mouth daily before breakfast. 90 tablet 0   fenofibrate  (TRICOR ) 145 MG tablet Take 1 tablet by mouth once daily 90 tablet 1   folic  acid (FOLVITE ) 1 MG tablet Take 1 tablet (1 mg total) by mouth daily.     furosemide  (LASIX ) 80 MG tablet Take 1 tablet by mouth twice daily as needed 180 tablet 0   gabapentin  (NEURONTIN ) 800 MG tablet Take 1 tablet (800 mg total) by mouth 2 (two) times daily. 270 tablet 0   glucose blood (FREESTYLE LITE) test strip Use to check blood sugar 3 times a day 300 each 12   ketoconazole  (NIZORAL ) 2 % cream Apply 1 Application topically daily. 1 gram daily. 30 g 0   Lancet Devices MISC Use to check blood glucose TID 100 each 0   Lancets (FREESTYLE) lancets Use to check blood sugar 3 times a day 300 each 12   lidocaine -prilocaine  (EMLA ) cream Apply 1 Application topically as needed (apply to both feet  daily). 30 g 0   metFORMIN  (GLUCOPHAGE -XR) 500 MG 24 hr tablet Take 1 tablet (500 mg) by mouth daily with breakfast. 90 tablet 3   methimazole  (TAPAZOLE ) 5 MG tablet TAKE 1 TABLET BY MOUTH IN THE MORNING 30 tablet 0   methocarbamol  (ROBAXIN -750) 750 MG tablet Take 1 tablet (750 mg total) by mouth every 8 (eight) hours as needed for muscle spasms. 90 tablet 1   pantoprazole  (PROTONIX ) 40 MG tablet TAKE 1 TABLET BY MOUTH ONCE DAILY . APPOINTMENT REQUIRED FOR FUTURE REFILLS 90 tablet 0   sildenafil  (VIAGRA ) 100 MG tablet Take 1/2-1 tablet by mouth daily as needed for erectile dysfunction. 30 tablet 2   tazarotene  (AVAGE ) 0.1 % cream Apply a thin layer to affected area(s) at nighttime 60 g 1   tirzepatide  (MOUNJARO ) 7.5 MG/0.5ML Pen Inject 7.5 mg into the skin once a week. 6 mL 1   tretinoin  (RETIN-A ) 0.05 % cream Apply 1 application  topically daily as needed (oil skin). 45 g 3   triamcinolone  cream (KENALOG ) 0.1 % Apply 1 Application topically 2 (two) times daily as needed (irritation). 15 g 2   No current facility-administered medications on file prior to visit.    BP 102/64 (BP Location: Right Arm, Patient Position: Sitting, Cuff Size: Large)   Pulse 69  Temp 98 F (36.7 C) (Oral)   Wt 241 lb 9.6 oz  (109.6 kg)   SpO2 98%   BMI 31.02 kg/m       Objective:   Physical Exam Vitals and nursing note reviewed.  Constitutional:      Appearance: Normal appearance.  HENT:     Nose: Rhinorrhea present. Rhinorrhea is purulent.     Right Turbinates: Enlarged and swollen.     Left Turbinates: Enlarged and swollen.     Right Sinus: Maxillary sinus tenderness and frontal sinus tenderness present.     Left Sinus: Maxillary sinus tenderness and frontal sinus tenderness present.     Mouth/Throat:     Mouth: Mucous membranes are moist.     Pharynx: Oropharynx is clear. Uvula midline. No pharyngeal swelling.  Cardiovascular:     Rate and Rhythm: Normal rate and regular rhythm.     Pulses: Normal pulses.     Heart sounds: Normal heart sounds.  Pulmonary:     Effort: Pulmonary effort is normal.     Breath sounds: Normal breath sounds. No wheezing or rhonchi.  Musculoskeletal:        General: Normal range of motion.  Skin:    General: Skin is warm and dry.     Capillary Refill: Capillary refill takes less than 2 seconds.  Neurological:     General: No focal deficit present.     Mental Status: He is alert and oriented to person, place, and time.  Psychiatric:        Mood and Affect: Mood normal.        Behavior: Behavior normal.        Thought Content: Thought content normal.        Judgment: Judgment normal.        Assessment & Plan:   1. Respiratory infection (Primary) - Will treat for suspected bacterial respiratory infection.  - Follow up if not improving in the next 3-4 days  - doxycycline  (VIBRAMYCIN ) 100 MG capsule; Take 1 capsule (100 mg total) by mouth 2 (two) times daily.  Dispense: 14 capsule; Refill: 0 - albuterol  (VENTOLIN  HFA) 108 (90 Base) MCG/ACT inhaler; Inhale 2 puffs into the lungs every 6 (six) hours as needed for wheezing or shortness of breath.  Dispense: 8 g; Refill: 0  2. Acute cough  - HYDROcodone  bit-homatropine (HYCODAN) 5-1.5 MG/5ML syrup; Take 5 mLs by  mouth every 8 (eight) hours as needed for cough.  Dispense: 120 mL; Refill: 0   Darleene Shape, NP

## 2024-06-14 ENCOUNTER — Encounter: Payer: Self-pay | Admitting: Internal Medicine

## 2024-06-14 ENCOUNTER — Ambulatory Visit: Payer: Medicare (Managed Care) | Admitting: Internal Medicine

## 2024-06-14 VITALS — BP 110/60 | HR 68 | Ht 74.0 in | Wt 243.6 lb

## 2024-06-14 DIAGNOSIS — E0500 Thyrotoxicosis with diffuse goiter without thyrotoxic crisis or storm: Secondary | ICD-10-CM | POA: Diagnosis not present

## 2024-06-14 DIAGNOSIS — E785 Hyperlipidemia, unspecified: Secondary | ICD-10-CM

## 2024-06-14 DIAGNOSIS — E1142 Type 2 diabetes mellitus with diabetic polyneuropathy: Secondary | ICD-10-CM | POA: Diagnosis not present

## 2024-06-14 DIAGNOSIS — Z7984 Long term (current) use of oral hypoglycemic drugs: Secondary | ICD-10-CM | POA: Diagnosis not present

## 2024-06-14 DIAGNOSIS — Z7985 Long-term (current) use of injectable non-insulin antidiabetic drugs: Secondary | ICD-10-CM | POA: Diagnosis not present

## 2024-06-14 LAB — T4, FREE: Free T4: 1.2 ng/dL (ref 0.8–1.8)

## 2024-06-14 LAB — TSH: TSH: 1.64 m[IU]/L (ref 0.40–4.50)

## 2024-06-14 LAB — T3, FREE: T3, Free: 3.7 pg/mL (ref 2.3–4.2)

## 2024-06-14 MED ORDER — METHIMAZOLE 5 MG PO TABS: 5.0000 mg | ORAL_TABLET | ORAL | Status: DC

## 2024-06-14 MED ORDER — EMPAGLIFLOZIN 10 MG PO TABS: 10.0000 mg | ORAL_TABLET | Freq: Every day | ORAL | 3 refills | Status: DC

## 2024-06-14 NOTE — Patient Instructions (Addendum)
 Please continue methimazole  5 mg every other day.  Please continue: - Jardiance  25 mg daily  - Metformin  ER 500 mg daily in am - Mounjaro  7.5 mg weekly  Please return in 6 months.
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
## 2024-06-15 ENCOUNTER — Other Ambulatory Visit (HOSPITAL_BASED_OUTPATIENT_CLINIC_OR_DEPARTMENT_OTHER): Payer: Self-pay | Admitting: Cardiovascular Disease

## 2024-06-15 ENCOUNTER — Ambulatory Visit: Payer: Self-pay | Admitting: Internal Medicine

## 2024-06-15 DIAGNOSIS — E785 Hyperlipidemia, unspecified: Secondary | ICD-10-CM

## 2024-06-15 LAB — MICROALBUMIN / CREATININE URINE RATIO
Creatinine, Urine: 54 mg/dL (ref 20–320)
Microalb Creat Ratio: 4 mg/g{creat} (ref <30)
Microalb, Ur: 0.2 mg/dL

## 2024-06-18 NOTE — Progress Notes (Signed)
 The patient attended a screening event on 03/29/2024 where his  BP screening results was 126/75, non-fasting blood glucose 125, hemoglobin A1c 6.3%. At the event pt stated he has become more active and lost weight. At the event the patient did not document insurance coverage and does not smoke. Patient did not have any SDOH insecurities. Pt list pcp as Darleene Shape, NP. At the event pt was instructed to keep staying active by clinician. Per chart review pt has a pcp and the last office visit was 06/13/2024 for respiratory infection. The pt BP was 102/64 on 06/13/2024. According to chart pt is being seen by a cardiologist and has a future appt on 06/28/2024. No additional Health equity team support indicated at this time.

## 2024-06-19 ENCOUNTER — Other Ambulatory Visit: Payer: Self-pay | Admitting: Family Medicine

## 2024-06-19 DIAGNOSIS — B354 Tinea corporis: Secondary | ICD-10-CM

## 2024-06-20 ENCOUNTER — Ambulatory Visit: Payer: Medicare (Managed Care) | Admitting: Internal Medicine

## 2024-06-28 ENCOUNTER — Ambulatory Visit: Payer: Medicare (Managed Care) | Attending: Cardiology | Admitting: Nurse Practitioner

## 2024-06-28 ENCOUNTER — Encounter: Payer: Self-pay | Admitting: Nurse Practitioner

## 2024-06-28 DIAGNOSIS — Z01810 Encounter for preprocedural cardiovascular examination: Secondary | ICD-10-CM

## 2024-06-28 NOTE — Progress Notes (Signed)
 Virtual Visit via Telephone Note   Because of [name] co-morbid illnesses, he is at least at moderate risk for complications without adequate follow up.  This format is felt to be most appropriate for this patient at this time.  Due to technical limitations with video connection (technology), today's appointment will be conducted as an audio only telehealth visit, and [name] verbally agreed to proceed in this manner.   All issues noted in this document were discussed and addressed.  No physical exam could be performed with this format.  Evaluation Performed:  Preoperative cardiovascular risk assessment _____________   Date:  06/28/2024   Patient ID:  Jeffrey Price, DOB 07-26-1956, MRN [id number] Patient Location:  Home Provider location:   Office  Primary Care Provider:  Merna Huxley, NP Primary Cardiologist:  Maude Emmer, MD  Chief Complaint / Patient Profile   68 y.o. y/o male with a h/o persistent atrial fibrillation on chronic anticoagulation s/p ablation 09/2022 with cardioversion 1 month later, hypertension, obesity, OSA, hyperlipidemia, hypothyroidism, type 2 diabetes who is pending open umbilical and supraumbilical hernia repair with mesh with Dr. Teresa on date TBD and presents today for telephonic preoperative cardiovascular risk assessment.  History of Present Illness    Jeffrey Price is a 68 y.o. male who presents via audio/video conferencing for a telehealth visit today.  Pt was last seen in cardiology clinic on 03/20/24 by Dr. Inocencio.  At that time AUBERT CHOYCE was doing well.  The patient is now pending procedure as outlined above. Since his last visit, he  denies chest pain, shortness of breath, lower extremity edema, fatigue, palpitations, melena, hematuria, hemoptysis, diaphoresis, weakness, presyncope, syncope, orthopnea, and PND.   Past Medical History    Past Medical History:  Diagnosis Date   Arthritis    in back   Atrial fibrillation  (HCC)    Blood clot in vein 1995   bil legs   Bulging lumbar disc    Cataract    mild   Cellulitis 2012   forehead   Complication of anesthesia    had trouble waking up 06/11/2020   DDD (degenerative disc disease), lumbar    Diabetes mellitus without complication (HCC)    GERD (gastroesophageal reflux disease)    Graves disease    Hx of adenomatous colonic polyps 12/15/2017   Hyperlipidemia    Hypertension    Neuromuscular disorder (HCC)    neuropathy in feet   OSA on CPAP    Sleep apnea    Thyrotoxicosis without thyroid  storm    Varicose veins of both lower extremities    Past Surgical History:  Procedure Laterality Date   ANTERIOR CERVICAL DECOMP/DISCECTOMY FUSION N/A 06/18/2021   Procedure: ANTERIOR CERVICAL DECOMPRESSION FUSION CERVICAL4- CERVICAL 5 WITH INSTRUEMENTATION AND ALLOGRAFT;  Surgeon: Beuford Anes, MD;  Location: [facility] OR;  Service: Orthopedics;  Laterality: N/A;   ATRIAL FIBRILLATION ABLATION N/A 09/14/2022   Procedure: ATRIAL FIBRILLATION ABLATION;  Surgeon: Inocencio Soyla Lunger, MD;  Location: [facility] INVASIVE CV LAB;  Service: Cardiovascular;  Laterality: N/A;   BACK SURGERY  06/11/2020   BUBBLE STUDY  09/10/2022   Procedure: BUBBLE STUDY;  Surgeon: Santo Stanly LABOR, MD;  Location: [facility] ENDOSCOPY;  Service: Cardiovascular;;   CARDIOVERSION N/A 12/22/2021   Procedure: CARDIOVERSION;  Surgeon: Emmer Maude BROCKS, MD;  Location: [facility] ENDOSCOPY;  Service: Cardiovascular;  Laterality: N/A;   CARDIOVERSION N/A 10/18/2022   Procedure: CARDIOVERSION;  Surgeon: Loni Soyla LABOR, MD;  Location: [facility] ENDOSCOPY;  Service: Cardiovascular;  Laterality: N/A;   ENDOVENOUS ABLATION SAPHENOUS VEIN W/ LASER Left 10/18/2019   endovenous laser ablation left greater saphenous vein and stab phlebectomy > 20 incisions left leg by Medford Blade MD    ENDOVENOUS ABLATION SAPHENOUS VEIN W/ LASER Right 11/08/2019   endovenous laser ablation right greater saphenous vein and stab phlebectomy  10-20 incisions right leg by Medford Blade MD    LUMBAR EPIDURAL INJECTION     SPINE SURGERY     TEE WITHOUT CARDIOVERSION N/A 12/22/2021   Procedure: TRANSESOPHAGEAL ECHOCARDIOGRAM (TEE);  Surgeon: Delford Maude BROCKS, MD;  Location: [facility] ENDOSCOPY;  Service: Cardiovascular;  Laterality: N/A;   TEE WITHOUT CARDIOVERSION N/A 09/10/2022   Procedure: TRANSESOPHAGEAL ECHOCARDIOGRAM (TEE);  Surgeon: Santo Stanly LABOR, MD;  Location: [facility] ENDOSCOPY;  Service: Cardiovascular;  Laterality: N/A;    Allergies  No Known Allergies  Home Medications    Prior to Admission medications   Medication Sig Start Date End Date Taking? Authorizing Provider  acetaminophen  (TYLENOL ) 325 MG tablet Take 2 tablets (650 mg total) by mouth every 6 (six) hours as needed for mild pain (or Fever >/= 101). 12/02/21   Elgergawy, Brayton RAMAN, MD  albuterol  (VENTOLIN  HFA) 108 (90 Base) MCG/ACT inhaler Inhale 2 puffs into the lungs every 6 (six) hours as needed for wheezing or shortness of breath. 06/13/24   Nafziger, Darleene, NP  apixaban  (ELIQUIS ) 5 MG TABS tablet Take 1 tablet (5 mg total) by mouth 2 (two) times daily. 02/06/24   Camnitz, Soyla Lunger, MD  atenolol  (TENORMIN ) 25 MG tablet Take 1 tablet (25 mg total) by mouth 2 (two) times daily. 03/21/24 06/19/24  Nafziger, Darleene, NP  atorvastatin  (LIPITOR) 20 MG tablet Take 1 tablet by mouth once daily 06/18/24   Nishan, Peter C, MD  Blood Gluc Meter Disp-Strips (BLOOD GLUCOSE METER DISPOSABLE) DEVI Use to check blood glucose TID 10/21/23   Merna Darleene, NP  blood glucose meter kit and supplies KIT Dispense based on patient and insurance preference. Use up to four times daily as directed. 10/21/23   Nafziger, Darleene, NP  Blood Glucose Monitoring Suppl (FREESTYLE FREEDOM LITE) w/Device KIT Use to check blood sugar 3 times a day 10/21/23   Trixie File, MD  cetirizine (ZYRTEC) 10 MG tablet Take 10 mg by mouth daily as needed for allergies.    [provider]  clotrimazole   (CLOTRIMAZOLE  AF) 1 % cream Apply topically twice daily as needed (rash). 10/12/22   Nafziger, Darleene, NP  clotrimazole -betamethasone  (LOTRISONE ) cream Apply 1 Application topically 2 (two) times daily. 08/10/23   Nafziger, Darleene, NP  Cyanocobalamin  (B-12 SL) Place 1 tablet under the tongue daily.    [provider]  doxycycline  (VIBRAMYCIN ) 100 MG capsule Take 1 capsule (100 mg total) by mouth 2 (two) times daily. 06/13/24   Nafziger, Darleene, NP  empagliflozin  (JARDIANCE ) 10 MG TABS tablet Take 1 tablet (10 mg total) by mouth daily before breakfast. 06/14/24   Trixie File, MD  fenofibrate  (TRICOR ) 145 MG tablet Take 1 tablet by mouth once daily 04/18/24   Nafziger, Cory, NP  folic acid  (FOLVITE ) 1 MG tablet Take 1 tablet (1 mg total) by mouth daily. 12/03/21   Elgergawy, Brayton RAMAN, MD  furosemide  (LASIX ) 80 MG tablet Take 1 tablet by mouth twice daily as needed 05/22/24   Nafziger, Darleene, NP  gabapentin  (NEURONTIN ) 800 MG tablet Take 1 tablet (800 mg total) by mouth 2 (two) times daily. 06/05/24   Dohmeier, Dedra, MD  glucose blood (FREESTYLE LITE)  test strip Use to check blood sugar 3 times a day 10/21/23   Trixie File, MD  HYDROcodone  bit-homatropine [facility]) 5-1.5 MG/5ML syrup Take 5 mLs by mouth every 8 (eight) hours as needed for cough. 06/13/24   Nafziger, Darleene, NP  ketoconazole  (NIZORAL ) 2 % cream APPLY A 1 GRAM APPLICATION TOPICALLY DAILY 06/19/24   [place], Betty G, MD  Lancet Devices MISC Use to check blood glucose TID 10/21/23   Merna Darleene, NP  Lancets (FREESTYLE) lancets Use to check blood sugar 3 times a day 10/21/23   Trixie File, MD  lidocaine -prilocaine  (EMLA ) cream Apply 1 Application topically as needed (apply to both feet  daily). 06/05/24   Dohmeier, Dedra, MD  metFORMIN  (GLUCOPHAGE -XR) 500 MG 24 hr tablet Take 1 tablet (500 mg) by mouth daily with breakfast. 10/19/23   Trixie File, MD  methimazole  (TAPAZOLE ) 5 MG tablet Take 1 tablet (5 mg total) by mouth every  other day. 06/14/24   Trixie File, MD  methocarbamol  (ROBAXIN -750) 750 MG tablet Take 1 tablet (750 mg total) by mouth every 8 (eight) hours as needed for muscle spasms. 03/08/24   Nafziger, Darleene, NP  pantoprazole  (PROTONIX ) 40 MG tablet TAKE 1 TABLET BY MOUTH ONCE DAILY . APPOINTMENT REQUIRED FOR FUTURE REFILLS 05/22/24   Merna Darleene, NP  sildenafil  (VIAGRA ) 100 MG tablet Take 1/2-1 tablet by mouth daily as needed for erectile dysfunction. 01/14/22   Koberlein, Junell C, MD  tazarotene  (AVAGE ) 0.1 % cream Apply a thin layer to affected area(s) at nighttime 05/16/24   Nafziger, Darleene, NP  tirzepatide  (MOUNJARO ) 7.5 MG/0.5ML Pen Inject 7.5 mg into the skin once a week. 03/30/24   Trixie File, MD  tretinoin  (RETIN-A ) 0.05 % cream Apply 1 application  topically daily as needed (oil skin). 05/28/24   Nafziger, Darleene, NP  triamcinolone  cream (KENALOG ) 0.1 % Apply 1 Application topically 2 (two) times daily as needed (irritation). 05/28/24   Merna Darleene, NP    Physical Exam    Vital Signs:  LORING LISKEY does not have vital signs available for review today.  Given telephonic nature of communication, physical exam is limited. AAOx3. NAD. Normal affect.  Speech and respirations are unlabored.  Accessory Clinical Findings    None  Assessment & Plan    1.  Preoperative Cardiovascular Risk Assessment: According to the Revised Cardiac Risk Index (RCRI), his Perioperative Risk of Major Cardiac Event is (%): 0.4. His Functional Capacity in METs is: 7.25 according to the Duke Activity Status Index (DASI). The patient is doing well from a cardiac perspective. Therefore, based on ACC/AHA guidelines, the patient would be at acceptable risk for the planned procedure without further cardiovascular testing.   The patient was advised that if he develops new symptoms prior to surgery to contact our office to arrange for a follow-up visit, and he verbalized understanding.  Per office protocol, patient can  hold Eliquis  for 2 days prior to procedure.   Patient will not need bridging with Lovenox (enoxaparin) around procedure.  A copy of this note will be routed to requesting surgeon.  Time:   Today, I have spent 10 minutes with the patient with telehealth technology discussing medical history, symptoms, and management plan.     Rosaline EMERSON Bane, NP-C  06/28/2024, 10:24 AM [address], [address] [place], [place] [address] Office [phone] Fax [phone]

## 2024-06-30 ENCOUNTER — Other Ambulatory Visit: Payer: Self-pay | Admitting: Neurology

## 2024-07-13 ENCOUNTER — Other Ambulatory Visit: Payer: Self-pay | Admitting: Family Medicine

## 2024-07-13 DIAGNOSIS — B354 Tinea corporis: Secondary | ICD-10-CM

## 2024-07-17 ENCOUNTER — Ambulatory Visit: Payer: Medicare (Managed Care) | Admitting: Neurology

## 2024-07-17 DIAGNOSIS — E1142 Type 2 diabetes mellitus with diabetic polyneuropathy: Secondary | ICD-10-CM

## 2024-07-17 DIAGNOSIS — G4733 Obstructive sleep apnea (adult) (pediatric): Secondary | ICD-10-CM

## 2024-07-17 DIAGNOSIS — G587 Mononeuritis multiplex: Secondary | ICD-10-CM

## 2024-07-17 DIAGNOSIS — I480 Paroxysmal atrial fibrillation: Secondary | ICD-10-CM

## 2024-07-18 NOTE — Progress Notes (Signed)
 [facility]   HOME SLEEP TEST REPORT ( by Watch PAT)   STUDY DATE:  07-17-2024  / mail order test     ORDERING CLINICIAN:   Nafziger, NP  REFERRING CLINICIAN:    CLINICAL INFORMATION/HISTORY:  repeat HST. Jeffrey Price is a 68 y.o. male patient who is here for revisit 06/05/2024 for  CPAP compliance: he also l has neuropathy with pain, he never had surgery for carpal tunnel dx in 06-06-2023 . Lidocaine  cream for his feet has helped a little bit,  he feels the pain in his feet when he is in bed, not while walking. ( Circulation??). Not creepy crawly sensations.  He also ws dx with possible double crush injuries to the neck last year.  co-morbidities that influence apnea and hypoxia are history of smoking, there have been changes seen in the CT of the lungs.  The patient has an aortic aneurysm which is monitored, carotid artery disease coronary artery disease and atrial fibrillation history,    Chief concern according to patient :  I have new insurance and wondered if I can change from CPAP to inspire ?   Can I try Lyrica  ?     OSA on CPAP. 2024:  The patient has been highly compliant with CPAP 100% of the last 30 days 27 of these days over 4 hours with an average of 6 hours.  Uses an auto CPAP between 8 and 20 cm water pressure with 3 cm EPR, his 95th percentile pressure is 14 cm water he does have still high air leakage but very few residual apneas.  His AHI is 1.3/h.  So I do not need to change any of the settings I will like for him to get the best fitting mask possible.       Epworth sleepiness score: 2/ 24   FSS at  28/ 63 points.   GDS 1/ 15.    BMI: 31.5 kg/m   Neck Circumference: 20   FINDINGS:   Sleep Summary:    Total Recording Time (hours, min):     6 h 35 m   Total Sleep Time (hours, min):        5 h 37 m          Percent REM (%):  20%                                       Respiratory Indices:   Calculated pAHI (per hour)   CMS based   :  Apnea Hypopnea Index ( AHI )  was 18.5 /h              REM pAHI:    24  /h                                           NREM pAHI:   17 /h                          Supine AHI:  42.3/h but this sleep position was present for less than 1 hour.  Oxygen Saturation Statistics:   Oxygen Saturation (%) Mean: 82%             O2 Saturation Range (%):      between 81 and 97%                                  O2 Saturation (minutes) <89%:        9 minutes    Pulse Rate Statistics:   Pulse Mean (bpm):    67 bpm             Pulse Range:   between 46 and 111 bpm   Caveat: the watch pat device does not provide data of cardiac rhythm.              IMPRESSION:  This HST confirms the presence of  moderate severe sleep apnea , following  CMS criteria ( Medicare)    RECOMMENDATION:  Avoiding supine sleep is important, and weight loss is always important to relive apnea and hypopnea.  I you are doing well  on CPAP, stay on PAP therapy!  Lets find the best mask fit and comfortable settings for you.  There was mostly sleep- positional hypoxia noted, the oxygen saturation dipped during supine sleep.  No central events were present.    After recent experiences have fallen short of expectations with hypoglossal nerve stimulator treatment, I will defer Inspire treatment to another sleep clinic, through your PCP or your cardiologist.       Any CPAP patient should be reminded to be fully compliant with PAP therapy , (defined as using PAP therapy for more than 4 hours each night ) with the goal to improve sleep related symptoms and decrease long term cardiovascular risks. Any PAP therapy patient should be reminded, that it may take up to 3 months to get fully used to using PAP and it may take 1-2 weeks for an established CPAP user to acclimatize to changes in pressure or mask. The earlier full compliance is achieved, the better long term compliance tends to be.    Please note that untreated obstructive sleep apnea may carry additional perioperative morbidity. Patients with significant obstructive sleep apnea should receive perioperative PAP therapy and the surgical team should be informed of the diagnosis and degree of sleep disordered breathing.  Sleep fragmentation in the presence of normal proportional sleep stages is a nonspecific findings and per se does not signify an intrinsic sleep disorder or a cause for the patient's sleep-related symptoms.  Causes include (but are not limited to) the unfamiliarity of sleeping while recorded by HST device or sleeping in a sleep lab for a full Polysomnography sleep study, but also circadian rhythm disturbances, medication side effects or an underlying mood disorder or medical problem.     INTERPRETING PHYSICIAN:   Dedra Gores, MD

## 2024-07-24 ENCOUNTER — Ambulatory Visit: Payer: Self-pay | Admitting: General Surgery

## 2024-07-24 NOTE — Progress Notes (Signed)
 Surgical Instructions   Your procedure is scheduled on Friday, 07/27/24. Report to [facility] Main Entrance A at  8 A.M., then check in with the Admitting office. Any questions or running late day of surgery: call [phone]  Questions prior to your surgery date: call [phone], Monday-Friday, 8am-4pm. If you experience any cold or flu symptoms such as cough, fever, chills, shortness of breath, etc. between now and your scheduled surgery, please notify us  at the above number.     Remember:  Do not eat after midnight the night before your surgery-Thursday   You may drink clear liquids until 7 AM, the morning of your surgery.   Clear liquids allowed are: Water, Non-Citrus Juices (without pulp), Carbonated Beverages, Clear Tea (no milk, honey, etc.), Black Coffee Only (NO MILK, CREAM OR POWDERED CREAMER of any kind), and Gatorade.    Take these medicines the morning of surgery with A SIP OF WATER: atenolol  (TENORMIN )  atorvastatin  (LIPITOR)  cetirizine (ZYRTEC)  fenofibrate  (TRICOR )  pantoprazole  (PROTONIX )   May take these medicines IF NEEDED: acetaminophen  (TYLENOL )  albuterol  (VENTOLIN  HFA) Inhaler  gabapentin  (NEURONTIN )  methimazole  (TAPAZOLE ) - Takes EOD  Hold Eliquis  2 days prior to procedure per MD.  Last dose will be on Tuesday, 07/24/24.   Hold Jardiance  72 hours prior to procedure.  Last dose was on Monday, 07/23/24.  Hold tirzepatide  (MOUNJARO ) 7 days prior to procedure.  Last dose on 07/19/24.  Do not take Metformin  on the day of surgery.   One week prior to surgery, STOP taking any Aspirin (unless otherwise instructed by your surgeon) Aleve, Naproxen, Ibuprofen, Motrin, Advil, Goody's, BC's, all herbal medications, fish oil, and non-prescription vitamins.                     Do NOT Smoke (Tobacco/Vaping) for 24 hours prior to your procedure.  If you use a CPAP at night, you may bring your mask/headgear for your overnight stay.   You will be asked to  remove any contacts, glasses, piercing's, hearing aid's, dentures/partials prior to surgery. Please bring cases for these items if needed.    Patients discharged the day of surgery will not be allowed to drive home, and someone needs to stay with them for 24 hours.  SURGICAL WAITING ROOM VISITATION Patients may have no more than 2 support people in the waiting area - these visitors may rotate.   Pre-op nurse will coordinate an appropriate time for 1 ADULT support person, who may not rotate, to accompany patient in pre-op.  Children under the age of 38 must have an adult with them who is not the patient and must remain in the main waiting area with an adult.  If the patient needs to stay at the hospital during part of their recovery, the visitor guidelines for inpatient rooms apply.  Please refer to the [facility] website for the visitor guidelines for any additional information.   If you received a COVID test during your pre-op visit  it is requested that you wear a mask when out in public, stay away from anyone that may not be feeling well and notify your surgeon if you develop symptoms. If you have been in contact with anyone that has tested positive in the last 10 days please notify you surgeon.      Pre-operative CHG Bathing Instructions   You can play a key role in reducing the risk of infection after surgery. Your skin needs to be as free of germs as  possible. You can reduce the number of germs on your skin by washing with CHG (chlorhexidine  gluconate) soap before surgery. CHG is an antiseptic soap that kills germs and continues to kill germs even after washing.   DO NOT use if you have an allergy to chlorhexidine /CHG or antibacterial soaps. If your skin becomes reddened or irritated, stop using the CHG and notify one of our RNs at [phone].              TAKE A SHOWER THE NIGHT BEFORE SURGERY AND THE DAY OF SURGERY    Please keep in mind the following:  DO NOT shave, including  legs and underarms, 48 hours prior to surgery.   You may shave your face before/day of surgery.  Place clean sheets on your bed the night before surgery Use a clean washcloth (not used since being washed) for each shower. DO NOT sleep with pet's night before surgery.  CHG Shower Instructions:  Wash your face and private area with normal soap. If you choose to wash your hair, wash first with your normal shampoo.  After you use shampoo/soap, rinse your hair and body thoroughly to remove shampoo/soap residue.  Turn the water OFF and apply half the bottle of CHG soap to a CLEAN washcloth.  Apply CHG soap ONLY FROM YOUR NECK DOWN TO YOUR TOES (washing for 3-5 minutes)  DO NOT use CHG soap on face, private areas, open wounds, or sores.  Pay special attention to the area where your surgery is being performed.  If you are having back surgery, having someone wash your back for you may be helpful. Wait 2 minutes after CHG soap is applied, then you may rinse off the CHG soap.  Pat dry with a clean towel  Put on clean pajamas    Additional instructions for the day of surgery: DO NOT APPLY any lotions, deodorants, cologne, or perfumes.   Do not wear jewelry or makeup Do not wear nail polish, gel polish, artificial nails, or any other type of covering on natural nails (fingers and toes) Do not bring valuables to the hospital. [facility] is not responsible for valuables/personal belongings. Put on clean/comfortable clothes.  Please brush your teeth.  Ask your nurse before applying any prescription medications to the skin.

## 2024-07-25 ENCOUNTER — Encounter (HOSPITAL_COMMUNITY)
Admission: RE | Admit: 2024-07-25 | Discharge: 2024-07-25 | Disposition: A | Discharge status: Home / Self Care | Payer: Medicare (Managed Care) | Source: Ambulatory Visit | Attending: General Surgery | Admitting: General Surgery

## 2024-07-25 ENCOUNTER — Other Ambulatory Visit: Payer: Self-pay

## 2024-07-25 ENCOUNTER — Encounter (HOSPITAL_COMMUNITY): Payer: Self-pay

## 2024-07-25 ENCOUNTER — Encounter: Payer: Self-pay | Admitting: Neurology

## 2024-07-25 VITALS — BP 114/65 | HR 65 | Temp 98.4°F | Resp 18 | Ht 74.0 in | Wt 239.7 lb

## 2024-07-25 DIAGNOSIS — I4819 Other persistent atrial fibrillation: Secondary | ICD-10-CM | POA: Diagnosis not present

## 2024-07-25 DIAGNOSIS — Z01812 Encounter for preprocedural laboratory examination: Secondary | ICD-10-CM | POA: Diagnosis not present

## 2024-07-25 DIAGNOSIS — K219 Gastro-esophageal reflux disease without esophagitis: Secondary | ICD-10-CM | POA: Diagnosis not present

## 2024-07-25 DIAGNOSIS — Z79899 Other long term (current) drug therapy: Secondary | ICD-10-CM | POA: Insufficient documentation

## 2024-07-25 DIAGNOSIS — E669 Obesity, unspecified: Secondary | ICD-10-CM | POA: Insufficient documentation

## 2024-07-25 DIAGNOSIS — Z7901 Long term (current) use of anticoagulants: Secondary | ICD-10-CM | POA: Diagnosis not present

## 2024-07-25 DIAGNOSIS — E0590 Thyrotoxicosis, unspecified without thyrotoxic crisis or storm: Secondary | ICD-10-CM | POA: Insufficient documentation

## 2024-07-25 DIAGNOSIS — Z87891 Personal history of nicotine dependence: Secondary | ICD-10-CM | POA: Diagnosis not present

## 2024-07-25 DIAGNOSIS — Z79890 Hormone replacement therapy (postmenopausal): Secondary | ICD-10-CM | POA: Diagnosis not present

## 2024-07-25 DIAGNOSIS — I10 Essential (primary) hypertension: Secondary | ICD-10-CM | POA: Diagnosis not present

## 2024-07-25 DIAGNOSIS — Z01818 Encounter for other preprocedural examination: Secondary | ICD-10-CM

## 2024-07-25 DIAGNOSIS — Z7985 Long-term (current) use of injectable non-insulin antidiabetic drugs: Secondary | ICD-10-CM | POA: Insufficient documentation

## 2024-07-25 DIAGNOSIS — E119 Type 2 diabetes mellitus without complications: Secondary | ICD-10-CM | POA: Diagnosis not present

## 2024-07-25 DIAGNOSIS — E1142 Type 2 diabetes mellitus with diabetic polyneuropathy: Secondary | ICD-10-CM

## 2024-07-25 DIAGNOSIS — G4733 Obstructive sleep apnea (adult) (pediatric): Secondary | ICD-10-CM | POA: Insufficient documentation

## 2024-07-25 DIAGNOSIS — E785 Hyperlipidemia, unspecified: Secondary | ICD-10-CM | POA: Diagnosis not present

## 2024-07-25 DIAGNOSIS — Z6830 Body mass index (BMI) 30.0-30.9, adult: Secondary | ICD-10-CM | POA: Insufficient documentation

## 2024-07-25 HISTORY — DX: Cardiac arrhythmia, unspecified: I49.9

## 2024-07-25 LAB — BASIC METABOLIC PANEL WITH GFR
Anion gap: 11 (ref 5–15)
BUN: 9 mg/dL (ref 8–23)
CO2: 29 mmol/L (ref 22–32)
Calcium: 9.4 mg/dL (ref 8.9–10.3)
Chloride: 99 mmol/L (ref 98–111)
Creatinine, Ser: 0.98 mg/dL (ref 0.61–1.24)
GFR, Estimated: >60 mL/min (ref >60)
Glucose, Bld: 108 mg/dL — ABNORMAL HIGH (ref 70–99)
Potassium: 3.4 mmol/L — ABNORMAL LOW (ref 3.5–5.1)
Sodium: 139 mmol/L (ref 135–145)

## 2024-07-25 LAB — CBC
HCT: 44.7 % (ref 39.0–52.0)
Hemoglobin: 14.8 g/dL (ref 13.0–17.0)
MCH: 29.1 pg (ref 26.0–34.0)
MCHC: 33.1 g/dL (ref 30.0–36.0)
MCV: 88 fL (ref 80.0–100.0)
Platelets: 279 K/uL (ref 150–400)
RBC: 5.08 MIL/uL (ref 4.22–5.81)
RDW: 14.3 % (ref 11.5–15.5)
WBC: 8.5 K/uL (ref 4.0–10.5)
nRBC: 0 % (ref 0.0–0.2)

## 2024-07-25 LAB — GLUCOSE, CAPILLARY: Glucose-Capillary: 122 mg/dL — ABNORMAL HIGH (ref 70–99)

## 2024-07-25 NOTE — Progress Notes (Signed)
 PCP - Darleene Shape, NP Cardiologist - Dr. Maude Emmer, clearance 06/28/2024 EP Cardiologist: Dr. Soyla Norton  PPM/ICD - denies Device Orders - na Rep Notified - na  Chest (CT): 05/08/2024 EKG - 03/20/2024 Stress Test - 01/20/2022 ECHO - 09/10/2022 Cardiac Cath -   Sleep Study - OSA CPAP - wears sometimes  Type II diabetic.  A1C 6.0 on 06/07/2024.  Blood sugar 122 at PAT Fasting Blood Sugar -110-125  Checks Blood Sugar: about 3 times per week  Last dose of GLP1 agonist-  Mounjaro  GLP1 instructions: hold 7 days, states last dose 07/19/2024  Blood Thinner Instructions: Eliquis , states last dose 07/24/2024 Aspirin Instructions:denies  ERAS Protcol -NPO  Anesthesia review: Yes. HTN, DM, OSA with CPAP, A-Fib  Patient denies shortness of breath, fever, cough and chest pain at PAT appointment   All instructions explained to the patient, with a verbal understanding of the material. Patient agrees to go over the instructions while at home for a better understanding. Patient also instructed to self quarantine after being tested for COVID-19. The opportunity to ask questions was provided.

## 2024-07-25 NOTE — Progress Notes (Addendum)
 Surgical Instructions   Your procedure is scheduled on Friday, 07/27/24. Report to [facility] Main Entrance A at  8 A.M., then check in with the Admitting office. Any questions or running late day of surgery: call [phone]  Questions prior to your surgery date: call [phone], Monday-Friday, 8am-4pm. If you experience any cold or flu symptoms such as cough, fever, chills, shortness of breath, etc. between now and your scheduled surgery, please notify us  at the above number.     Remember:  Do not eat or drink after midnight the night before your surgery-Thursday   Take these medicines the morning of surgery with A SIP OF WATER: atenolol  (TENORMIN )  atorvastatin  (LIPITOR)  cetirizine (ZYRTEC)  fenofibrate  (TRICOR )  pantoprazole  (PROTONIX )   May take these medicines IF NEEDED: acetaminophen  (TYLENOL )  albuterol  (VENTOLIN  HFA) Inhaler  gabapentin  (NEURONTIN )  methimazole  (TAPAZOLE ) - Takes EOD  Hold Eliquis  2 days prior to procedure per MD.  Last dose will be on Tuesday, 07/24/24.   One week prior to surgery, STOP taking any Aspirin (unless otherwise instructed by your surgeon) Aleve, Naproxen, Ibuprofen, Motrin, Advil, Goody's, BC's, all herbal medications, fish oil, and non-prescription vitamins.   WHAT DO I DO ABOUT MY DIABETES MEDICATION?   Do not take oral diabetes medicines (pills) the morning of surgery.        Hold Jardiance  72 hours prior to procedure.  Last dose was on Monday, 07/23/24.        Hold tirzepatide  (MOUNJARO ) 7 days prior to procedure.  Last dose on 07/19/24.        Do not take Metformin  on the day of surgery.  The day of surgery, do not take other diabetes injectables, including Byetta (exenatide), Bydureon (exenatide ER), Victoza (liraglutide), or Trulicity (dulaglutide).   HOW TO MANAGE YOUR DIABETES BEFORE AND AFTER SURGERY  Why is it important to control my blood sugar before and after surgery? Improving blood sugar levels before and  after surgery helps healing and can limit problems. A way of improving blood sugar control is eating a healthy diet by:  Eating less sugar and carbohydrates  Increasing activity/exercise  Talking with your doctor about reaching your blood sugar goals High blood sugars (greater than 180 mg/dL) can raise your risk of infections and slow your recovery, so you will need to focus on controlling your diabetes during the weeks before surgery. Make sure that the doctor who takes care of your diabetes knows about your planned surgery including the date and location.  How do I manage my blood sugar before surgery? Check your blood sugar at least 4 times a day, starting 2 days before surgery, to make sure that the level is not too high or low.  Check your blood sugar the morning of your surgery when you wake up and every 2 hours until you get to the Short Stay unit.  If your blood sugar is less than 70 mg/dL, you will need to treat for low blood sugar: Do not take insulin . Treat a low blood sugar (less than 70 mg/dL) with  cup of clear juice (cranberry or apple), 4 glucose tablets, OR glucose gel. Recheck blood sugar in 15 minutes after treatment (to make sure it is greater than 70 mg/dL). If your blood sugar is not greater than 70 mg/dL on recheck, call [phone] for further instructions. Report your blood sugar to the short stay nurse when you get to Short Stay.  If you are admitted to the hospital after surgery: Your blood sugar  will be checked by the staff and you will probably be given insulin  after surgery (instead of oral diabetes medicines) to make sure you have good blood sugar levels. The goal for blood sugar control after surgery is 80-180 mg/dL.                      Do NOT Smoke (Tobacco/Vaping) for 24 hours prior to your procedure.  If you use a CPAP at night, you may bring your mask/headgear for your overnight stay.   You will be asked to remove any contacts, glasses, piercing's,  hearing aid's, dentures/partials prior to surgery. Please bring cases for these items if needed.    Patients discharged the day of surgery will not be allowed to drive home, and someone needs to stay with them for 24 hours.  SURGICAL WAITING ROOM VISITATION Patients may have no more than 2 support people in the waiting area - these visitors may rotate.   Pre-op nurse will coordinate an appropriate time for 1 ADULT support person, who may not rotate, to accompany patient in pre-op.  Children under the age of 55 must have an adult with them who is not the patient and must remain in the main waiting area with an adult.  If the patient needs to stay at the hospital during part of their recovery, the visitor guidelines for inpatient rooms apply.  Please refer to the [facility] website for the visitor guidelines for any additional information.   If you received a COVID test during your pre-op visit  it is requested that you wear a mask when out in public, stay away from anyone that may not be feeling well and notify your surgeon if you develop symptoms. If you have been in contact with anyone that has tested positive in the last 10 days please notify you surgeon.      Pre-operative CHG Bathing Instructions   You can play a key role in reducing the risk of infection after surgery. Your skin needs to be as free of germs as possible. You can reduce the number of germs on your skin by washing with CHG (chlorhexidine  gluconate) soap before surgery. CHG is an antiseptic soap that kills germs and continues to kill germs even after washing.   DO NOT use if you have an allergy to chlorhexidine /CHG or antibacterial soaps. If your skin becomes reddened or irritated, stop using the CHG and notify one of our RNs at [phone].              TAKE A SHOWER THE NIGHT BEFORE SURGERY AND THE DAY OF SURGERY    Please keep in mind the following:  DO NOT shave, including legs and underarms, 48 hours prior to  surgery.   You may shave your face before/day of surgery.  Place clean sheets on your bed the night before surgery Use a clean washcloth (not used since being washed) for each shower. DO NOT sleep with pet's night before surgery.  CHG Shower Instructions:  Wash your face and private area with normal soap. If you choose to wash your hair, wash first with your normal shampoo.  After you use shampoo/soap, rinse your hair and body thoroughly to remove shampoo/soap residue.  Turn the water OFF and apply half the bottle of CHG soap to a CLEAN washcloth.  Apply CHG soap ONLY FROM YOUR NECK DOWN TO YOUR TOES (washing for 3-5 minutes)  DO NOT use CHG soap on face, private areas, open wounds,  or sores.  Pay special attention to the area where your surgery is being performed.  If you are having back surgery, having someone wash your back for you may be helpful. Wait 2 minutes after CHG soap is applied, then you may rinse off the CHG soap.  Pat dry with a clean towel  Put on clean pajamas    Additional instructions for the day of surgery: DO NOT APPLY any lotions, deodorants, cologne, or perfumes.   Do not wear jewelry or makeup Do not wear nail polish, gel polish, artificial nails, or any other type of covering on natural nails (fingers and toes) Do not bring valuables to the hospital. [facility] is not responsible for valuables/personal belongings. Put on clean/comfortable clothes.  Please brush your teeth.  Ask your nurse before applying any prescription medications to the skin.

## 2024-07-26 NOTE — H&P (Signed)
 HPI  Jeffrey Price is an 68 y.o. male who was seen in clinic on 05/25/24 for evaluation of umbilical and supraumbilical hernias.  Patient states he has had the hernias for several years, not exactly sure when they first appeared. Umbilical hernia sometimes mildly tender to deep palpation. No skin changes, no obstructive symptoms. Remain soft. Unable to reduce supraumbilical hernia and partially reducing umbilical hernia. Both fat containing.   10 point review of systems is negative except as listed above in HPI.  Objective  Past Medical History: Past Medical History:  Diagnosis Date   Arthritis    in back   Atrial fibrillation (HCC)    Blood clot in vein 1995   bil legs   Bulging lumbar disc    Cataract    mild   Cellulitis 2012   forehead   Complication of anesthesia    had trouble waking up 06/11/2020   DDD (degenerative disc disease), lumbar    Diabetes mellitus without complication (HCC)    Dysrhythmia    A-fib   GERD (gastroesophageal reflux disease)    Graves disease    Hx of adenomatous colonic polyps 12/15/2017   Hyperlipidemia    Hypertension    Neuromuscular disorder (HCC)    neuropathy in feet   OSA on CPAP    wears CPAP sometimes   Sleep apnea    Thyrotoxicosis without thyroid  storm    Varicose veins of both lower extremities     Past Surgical History: Past Surgical History:  Procedure Laterality Date   ANTERIOR CERVICAL DECOMP/DISCECTOMY FUSION N/A 06/18/2021   Procedure: ANTERIOR CERVICAL DECOMPRESSION FUSION CERVICAL4- CERVICAL 5 WITH INSTRUEMENTATION AND ALLOGRAFT;  Surgeon: Jeffrey Anes, MD;  Location: [facility] OR;  Service: Orthopedics;  Laterality: N/A;   ATRIAL FIBRILLATION ABLATION N/A 09/14/2022   Procedure: ATRIAL FIBRILLATION ABLATION;  Surgeon: Jeffrey Soyla Lunger, MD;  Location: [facility] INVASIVE CV LAB;  Service: Cardiovascular;  Laterality: N/A;   BACK SURGERY  06/11/2020   BUBBLE STUDY  09/10/2022   Procedure: BUBBLE STUDY;  Surgeon:  Jeffrey Stanly LABOR, MD;  Location: [facility] ENDOSCOPY;  Service: Cardiovascular;;   CARDIOVERSION N/A 12/22/2021   Procedure: CARDIOVERSION;  Surgeon: Jeffrey Maude BROCKS, MD;  Location: [facility] ENDOSCOPY;  Service: Cardiovascular;  Laterality: N/A;   CARDIOVERSION N/A 10/18/2022   Procedure: CARDIOVERSION;  Surgeon: Jeffrey Soyla LABOR, MD;  Location: [facility] ENDOSCOPY;  Service: Cardiovascular;  Laterality: N/A;   ENDOVENOUS ABLATION SAPHENOUS VEIN W/ LASER Left 10/18/2019   endovenous laser ablation left greater saphenous vein and stab phlebectomy > 20 incisions left leg by Jeffrey Blade MD    ENDOVENOUS ABLATION SAPHENOUS VEIN W/ LASER Right 11/08/2019   endovenous laser ablation right greater saphenous vein and stab phlebectomy 10-20 incisions right leg by Jeffrey Blade MD    LUMBAR EPIDURAL INJECTION     SPINE SURGERY     TEE WITHOUT CARDIOVERSION N/A 12/22/2021   Procedure: TRANSESOPHAGEAL ECHOCARDIOGRAM (TEE);  Surgeon: Jeffrey Maude BROCKS, MD;  Location: [facility] ENDOSCOPY;  Service: Cardiovascular;  Laterality: N/A;   TEE WITHOUT CARDIOVERSION N/A 09/10/2022   Procedure: TRANSESOPHAGEAL ECHOCARDIOGRAM (TEE);  Surgeon: Jeffrey Stanly LABOR, MD;  Location: [facility] ENDOSCOPY;  Service: Cardiovascular;  Laterality: N/A;    Family History:  Family History  Problem Relation Age of Onset   Thyroid  disease Mother    Parkinson's disease Father    Colon cancer Neg Hx    Rectal cancer Neg Hx    Stomach cancer Neg Hx    Esophageal cancer  Neg Hx     Social History:  reports that he quit smoking about 12 years ago. His smoking use included e-cigarettes and cigarettes. He has never used smokeless tobacco. He reports current alcohol use. He reports that he does not use drugs.  Allergies: No Known Allergies  Medications: I have reviewed the patient's current medications.  Labs: Pertinent lab work personally reviewed.  Imaging: Pertinent imaging personally reviewed  CT AP 03/02/24: Two hernias noted, one fat  containing umbilical hernia and one fat containing supraumbilical hernia. Umbilical hernia measuring ~1x1.5cm, and supraumbilical hernia measuring 1x 2.5cm. There is ~1.5cm gap between the two hernias   Physical Exam There were no vitals taken for this visit. General: No acute distress, well appearing HEENT: PERRL, hearing grossly normal, mucous membranes moist CV: Regular rate and rhythm Pulm: Normal work of breathing on room air Abd: Soft, nontender, nondistended. Umbilical hernia fat containing, partially reducible. Supraumbilical hernia soft but unable to reduce. Extremities: Warm and well perfused Neuro: A&O x4, no focal neurologic deficits Psych: Appropriate mood and effect    Assessment   Jeffrey Price is an 68 y.o. male with umbilical and supraumbilical hernias  Plan  Will proceed with open repair of umbilical and supraumbilical hernia. We discussed likely the hernias are close enough that I would connect the bridging fascia creating one defect for mesh placement. If intra-op I note they are far enough away and that each individual hernia is <2cm then I would likely primarily repair both rather than creating a larger defect for mesh placement. - We discussed risks of surgery including but not limited to: bleeding, infection, post op seroma, post-op hematoma, damage to surrounding structures, recurrence, pain. Patient voiced understanding and wishes to proceed with surgery.    Jeffrey Silversmith, MD [facility]

## 2024-07-26 NOTE — Anesthesia Preprocedure Evaluation (Signed)
 Anesthesia Evaluation  Patient identified by MRN, date of birth, ID band Patient awake    Reviewed: Allergy & Precautions, H&P , NPO status , Patient's Chart, lab work & pertinent test results  History of Anesthesia Complications Negative for: history of anesthetic complications  Airway Mallampati: II  TM Distance: >3 FB Neck ROM: Full    Dental no notable dental hx.    Pulmonary sleep apnea and Continuous Positive Airway Pressure Ventilation , COPD, former smoker   Pulmonary exam normal breath sounds clear to auscultation       Cardiovascular hypertension, + CAD  negative cardio ROS Normal cardiovascular exam+ dysrhythmias Atrial Fibrillation  Rhythm:Regular Rate:Normal  TEE 2023 IMPRESSIONS     1. Left ventricular ejection fraction, by estimation, is 50 to 55%. The  left ventricle has low normal function.   2. Right ventricular systolic function is normal. The right ventricular  size is normal.   3. The left atrial appendage is patent. It is a mixed cauliflower with  one posterior lobe. Ostial dimensions are 28 mm by 24 mm with suitable  depth for a LAA-O device if desired.. Left atrial size was moderately  dilated. No left atrial/left atrial  appendage thrombus was detected. The LAA emptying velocity was 57 cm/s.   4. Right atrial size was moderately dilated.   5. The mitral valve is normal in structure. Trivial mitral valve  regurgitation. No evidence of mitral stenosis.   6. The aortic valve is tricuspid. Aortic valve regurgitation is trivial.  No aortic stenosis is present.   7. Dillated SVC and coronary sinus.   8. Evidence of atrial level shunting detected by color flow Doppler.  Agitated saline contrast bubble study was positive with shunting observed  within 3-6 cardiac cycles suggestive of interatrial shunt. Shunting  occured with Valsalva equivalent.      Neuro/Psych neg Seizures  Neuromuscular disease   negative psych ROS   GI/Hepatic negative GI ROS, Neg liver ROS,GERD  ,,  Endo/Other  negative endocrine ROSdiabetes, Type 2    Renal/GU negative Renal ROS  negative genitourinary   Musculoskeletal  (+) Arthritis ,    Abdominal   Peds negative pediatric ROS (+)  Hematology negative hematology ROS (+)   Anesthesia Other Findings   Reproductive/Obstetrics negative OB ROS                              Anesthesia Physical Anesthesia Plan  ASA: 3  Anesthesia Plan: General   Post-op Pain Management: Tylenol  PO (pre-op)*   Induction: Intravenous  PONV Risk Score and Plan: 2 and Ondansetron , Dexamethasone  and Treatment may vary due to age or medical condition  Airway Management Planned: Oral ETT  Additional Equipment: None  Intra-op Plan:   Post-operative Plan: Extubation in OR  Informed Consent: I have reviewed the patients History and Physical, chart, labs and discussed the procedure including the risks, benefits and alternatives for the proposed anesthesia with the patient or authorized representative who has indicated his/her understanding and acceptance.     Dental advisory given  Plan Discussed with: CRNA  Anesthesia Plan Comments: (PAT note by Lynwood Hope, PA-C: 68 yo male follows with cardiology for hx of persistent atrial fibrillation on chronic anticoagulation s/p ablation 09/2022 with cardioversion 1 month later, hypertension, obesity, OSA, hyperlipidemia. TEE 09/2022 showed EF 50-55%, normal RV, no significant valvular abnormalities. Nuclear stress 01/2022 showed no ischemia. Seen by Rosaline Bane. NP on 06/28/24 for preop eval.  Per note, Preoperative Cardiovascular Risk Assessment: According to the Revised Cardiac Risk Index (RCRI), his Perioperative Risk of Major Cardiac Event is (%): 0.4. His Functional Capacity in METs is: 7.25 according to the Duke Activity Status Index (DASI). The patient is doing well from a cardiac  perspective. Therefore, based on ACC/AHA guidelines, the patient would be at acceptable risk for the planned procedure without further cardiovascular testing. The patient was advised that if he develops new symptoms prior to surgery to contact our office to arrange for a follow-up visit, and he verbalized understanding. Per office protocol, patient can hold Eliquis  for 2 days prior to procedure. Patient will not need bridging with Lovenox  (enoxaparin ) around procedure.  Other pertinent hx includes former smoker (quit 2013), hyperthyroidism on methimazole , NIDDM2 (A1c 6.0 06/07/24), GERD on PPI, OSA on CPAP.  Reports LD Mounjaro  07/19/24. LD Eliquis  07/24/24.  Preop labs reviewed, unremarkable.  EKG 03/20/24: NSR. Rate 61.  TEE 09/10/22: 1. Left ventricular ejection fraction, by estimation, is 50 to 55%. The  left ventricle has low normal function.   2. Right ventricular systolic function is normal. The right ventricular  size is normal.   3. The left atrial appendage is patent. It is a mixed cauliflower with  one posterior lobe. Ostial dimensions are 28 mm by 24 mm with suitable  depth for a LAA-O device if desired.. Left atrial size was moderately  dilated. No left atrial/left atrial  appendage thrombus was detected. The LAA emptying velocity was 57 cm/s.   4. Right atrial size was moderately dilated.   5. The mitral valve is normal in structure. Trivial mitral valve  regurgitation. No evidence of mitral stenosis.   6. The aortic valve is tricuspid. Aortic valve regurgitation is trivial.  No aortic stenosis is present.   7. Dillated SVC and coronary sinus.   8. Evidence of atrial level shunting detected by color flow Doppler.  Agitated saline contrast bubble study was positive with shunting observed  within 3-6 cardiac cycles suggestive of interatrial shunt. Shunting  occured with Valsalva equivalent.   Nuclear stress 01/20/22:   Findings are consistent with no prior ischemia. The study  is intermediate risk due to reduced LVEF   No ST deviation was noted.   LV perfusion is normal.   Left ventricular function is abnormal. Nuclear stress EF: 37 %. The left ventricular ejection fraction is moderately decreased (30-44%). Visually appears 45-50%.  End diastolic cavity size is normal.   Prior study not available for comparison.   Consider limited TTE to assess EF  )         Anesthesia Quick Evaluation

## 2024-07-26 NOTE — Progress Notes (Signed)
 Anesthesia Chart Review:  68 yo male follows with cardiology for hx of persistent atrial fibrillation on chronic anticoagulation s/p ablation 09/2022 with cardioversion 1 month later, hypertension, obesity, OSA, hyperlipidemia. TEE 09/2022 showed EF 50-55%, normal RV, no significant valvular abnormalities. Nuclear stress 01/2022 showed no ischemia. Seen by Rosaline Bane. NP on 06/28/24 for preop eval. Per note, Preoperative Cardiovascular Risk Assessment: According to the Revised Cardiac Risk Index (RCRI), his Perioperative Risk of Major Cardiac Event is (%): 0.4. His Functional Capacity in METs is: 7.25 according to the Duke Activity Status Index (DASI). The patient is doing well from a cardiac perspective. Therefore, based on ACC/AHA guidelines, the patient would be at acceptable risk for the planned procedure without further cardiovascular testing. The patient was advised that if he develops new symptoms prior to surgery to contact our office to arrange for a follow-up visit, and he verbalized understanding. Per office protocol, patient can hold Eliquis  for 2 days prior to procedure. Patient will not need bridging with Lovenox  (enoxaparin ) around procedure.  Other pertinent hx includes former smoker (quit 2013), hyperthyroidism on methimazole , NIDDM2 (A1c 6.0 06/07/24), GERD on PPI, OSA on CPAP.  Reports LD Mounjaro  07/19/24. LD Eliquis  07/24/24.  Preop labs reviewed, unremarkable.  EKG 03/20/24: NSR. Rate 61.  TEE 09/10/22: 1. Left ventricular ejection fraction, by estimation, is 50 to 55%. The  left ventricle has low normal function.   2. Right ventricular systolic function is normal. The right ventricular  size is normal.   3. The left atrial appendage is patent. It is a mixed cauliflower with  one posterior lobe. Ostial dimensions are 28 mm by 24 mm with suitable  depth for a LAA-O device if desired.. Left atrial size was moderately  dilated. No left atrial/left atrial  appendage thrombus  was detected. The LAA emptying velocity was 57 cm/s.   4. Right atrial size was moderately dilated.   5. The mitral valve is normal in structure. Trivial mitral valve  regurgitation. No evidence of mitral stenosis.   6. The aortic valve is tricuspid. Aortic valve regurgitation is trivial.  No aortic stenosis is present.   7. Dillated SVC and coronary sinus.   8. Evidence of atrial level shunting detected by color flow Doppler.  Agitated saline contrast bubble study was positive with shunting observed  within 3-6 cardiac cycles suggestive of interatrial shunt. Shunting  occured with Valsalva equivalent.   Nuclear stress 01/20/22:   Findings are consistent with no prior ischemia. The study is intermediate risk due to reduced LVEF   No ST deviation was noted.   LV perfusion is normal.   Left ventricular function is abnormal. Nuclear stress EF: 37 %. The left ventricular ejection fraction is moderately decreased (30-44%). Visually appears 45-50%.  End diastolic cavity size is normal.   Prior study not available for comparison.   Consider limited TTE to assess EF   Daunte, [place] [facility] Short Stay Center/Anesthesiology Phone [phone] 07/26/2024 8:01 AM

## 2024-07-27 ENCOUNTER — Ambulatory Visit (HOSPITAL_COMMUNITY): Payer: Medicare (Managed Care) | Admitting: Anesthesiology

## 2024-07-27 ENCOUNTER — Other Ambulatory Visit: Payer: Self-pay

## 2024-07-27 ENCOUNTER — Ambulatory Visit (HOSPITAL_COMMUNITY)
Admission: RE | Admit: 2024-07-27 | Discharge: 2024-07-27 | Disposition: A | Discharge status: Home / Self Care | Payer: Medicare (Managed Care) | Attending: General Surgery | Admitting: General Surgery

## 2024-07-27 ENCOUNTER — Encounter (HOSPITAL_COMMUNITY): Payer: Self-pay | Admitting: Physician Assistant

## 2024-07-27 ENCOUNTER — Encounter (HOSPITAL_COMMUNITY)
Admission: RE | Disposition: A | Discharge status: Home / Self Care | Payer: Self-pay | Source: Home / Self Care | Attending: General Surgery

## 2024-07-27 ENCOUNTER — Encounter (HOSPITAL_COMMUNITY): Payer: Self-pay | Admitting: General Surgery

## 2024-07-27 ENCOUNTER — Other Ambulatory Visit: Payer: Self-pay | Admitting: Cardiology

## 2024-07-27 DIAGNOSIS — K429 Umbilical hernia without obstruction or gangrene: Secondary | ICD-10-CM

## 2024-07-27 DIAGNOSIS — J449 Chronic obstructive pulmonary disease, unspecified: Secondary | ICD-10-CM | POA: Insufficient documentation

## 2024-07-27 DIAGNOSIS — I480 Paroxysmal atrial fibrillation: Secondary | ICD-10-CM

## 2024-07-27 DIAGNOSIS — G709 Myoneural disorder, unspecified: Secondary | ICD-10-CM | POA: Diagnosis not present

## 2024-07-27 DIAGNOSIS — M1990 Unspecified osteoarthritis, unspecified site: Secondary | ICD-10-CM | POA: Diagnosis not present

## 2024-07-27 DIAGNOSIS — I2510 Atherosclerotic heart disease of native coronary artery without angina pectoris: Secondary | ICD-10-CM | POA: Diagnosis not present

## 2024-07-27 DIAGNOSIS — Z87891 Personal history of nicotine dependence: Secondary | ICD-10-CM | POA: Insufficient documentation

## 2024-07-27 DIAGNOSIS — K439 Ventral hernia without obstruction or gangrene: Secondary | ICD-10-CM | POA: Insufficient documentation

## 2024-07-27 DIAGNOSIS — I4819 Other persistent atrial fibrillation: Secondary | ICD-10-CM | POA: Diagnosis not present

## 2024-07-27 DIAGNOSIS — K219 Gastro-esophageal reflux disease without esophagitis: Secondary | ICD-10-CM | POA: Insufficient documentation

## 2024-07-27 DIAGNOSIS — Z7901 Long term (current) use of anticoagulants: Secondary | ICD-10-CM | POA: Insufficient documentation

## 2024-07-27 DIAGNOSIS — Z7985 Long-term (current) use of injectable non-insulin antidiabetic drugs: Secondary | ICD-10-CM | POA: Diagnosis not present

## 2024-07-27 DIAGNOSIS — E1142 Type 2 diabetes mellitus with diabetic polyneuropathy: Secondary | ICD-10-CM

## 2024-07-27 DIAGNOSIS — I10 Essential (primary) hypertension: Secondary | ICD-10-CM | POA: Insufficient documentation

## 2024-07-27 DIAGNOSIS — G4733 Obstructive sleep apnea (adult) (pediatric): Secondary | ICD-10-CM | POA: Insufficient documentation

## 2024-07-27 DIAGNOSIS — E119 Type 2 diabetes mellitus without complications: Secondary | ICD-10-CM | POA: Diagnosis not present

## 2024-07-27 HISTORY — PX: UMBILICAL HERNIA REPAIR: SHX196

## 2024-07-27 LAB — GLUCOSE, CAPILLARY
Glucose-Capillary: 137 mg/dL — ABNORMAL HIGH (ref 70–99)
Glucose-Capillary: 135 mg/dL — ABNORMAL HIGH (ref 70–99)

## 2024-07-27 SURGERY — REPAIR, HERNIA, UMBILICAL, ADULT
Anesthesia: General | Site: Abdomen

## 2024-07-27 MED ORDER — PROPOFOL 10 MG/ML IV BOLUS
INTRAVENOUS | Status: AC
Start: 2024-07-27 — End: 2024-07-27
  Filled 2024-07-27: qty 20

## 2024-07-27 MED ORDER — CEFAZOLIN SODIUM-DEXTROSE 2-4 GM/100ML-% IV SOLN
2.0000 g | INTRAVENOUS | Status: AC
Start: 2024-07-27 — End: 2024-07-27
  Administered 2024-07-27: 2 g via INTRAVENOUS

## 2024-07-27 MED ORDER — LACTATED RINGERS IV SOLN: INTRAVENOUS | Status: DC

## 2024-07-27 MED ORDER — ONDANSETRON HCL 4 MG/2ML IJ SOLN
INTRAMUSCULAR | Status: DC | PRN
  Administered 2024-07-27: 4 mg via INTRAVENOUS

## 2024-07-27 MED ORDER — DROPERIDOL 2.5 MG/ML IJ SOLN: 0.6250 mg | Freq: Once | INTRAMUSCULAR | Status: DC | PRN

## 2024-07-27 MED ORDER — ENOXAPARIN SODIUM 40 MG/0.4ML IJ SOSY
PREFILLED_SYRINGE | INTRAMUSCULAR | Status: AC
  Administered 2024-07-27: 40 mg via SUBCUTANEOUS
  Filled 2024-07-27: qty 0.4

## 2024-07-27 MED ORDER — CHLORHEXIDINE GLUCONATE 0.12 % MT SOLN
OROMUCOSAL | Status: AC
  Administered 2024-07-27: 15 mL via OROMUCOSAL
  Filled 2024-07-27: qty 15

## 2024-07-27 MED ORDER — INSULIN ASPART 100 UNIT/ML IJ SOLN: 0.0000 [IU] | INTRAMUSCULAR | Status: DC | PRN

## 2024-07-27 MED ORDER — ONDANSETRON HCL 4 MG/2ML IJ SOLN
INTRAMUSCULAR | Status: AC
  Filled 2024-07-27: qty 2

## 2024-07-27 MED ORDER — ORAL CARE MOUTH RINSE: 15.0000 mL | Freq: Once | OROMUCOSAL | Status: AC

## 2024-07-27 MED ORDER — MIDAZOLAM HCL 2 MG/2ML IJ SOLN
INTRAMUSCULAR | Status: DC | PRN
  Administered 2024-07-27: 2 mg via INTRAVENOUS

## 2024-07-27 MED ORDER — FENTANYL CITRATE (PF) 100 MCG/2ML IJ SOLN: 25.0000 ug | INTRAMUSCULAR | Status: DC | PRN

## 2024-07-27 MED ORDER — PROPOFOL 10 MG/ML IV BOLUS
INTRAVENOUS | Status: DC | PRN
  Administered 2024-07-27: 150 mg via INTRAVENOUS
  Administered 2024-07-27: 50 mg via INTRAVENOUS

## 2024-07-27 MED ORDER — OXYCODONE HCL 5 MG/5ML PO SOLN: 5.0000 mg | Freq: Once | ORAL | Status: AC | PRN

## 2024-07-27 MED ORDER — MIDAZOLAM HCL 2 MG/2ML IJ SOLN
INTRAMUSCULAR | Status: AC
Start: 2024-07-27 — End: 2024-07-27
  Filled 2024-07-27: qty 2

## 2024-07-27 MED ORDER — LIDOCAINE 2% (20 MG/ML) 5 ML SYRINGE
INTRAMUSCULAR | Status: DC | PRN
  Administered 2024-07-27: 100 mg via INTRAVENOUS

## 2024-07-27 MED ORDER — ACETAMINOPHEN 10 MG/ML IV SOLN: 1000.0000 mg | Freq: Once | INTRAVENOUS | Status: DC | PRN

## 2024-07-27 MED ORDER — ACETAMINOPHEN 500 MG PO TABS
ORAL_TABLET | ORAL | Status: AC
  Administered 2024-07-27: 1000 mg via ORAL
  Filled 2024-07-27: qty 2

## 2024-07-27 MED ORDER — OXYCODONE HCL 5 MG PO TABS: 5.0000 mg | ORAL_TABLET | ORAL | 0 refills | Status: AC | PRN

## 2024-07-27 MED ORDER — FENTANYL CITRATE (PF) 250 MCG/5ML IJ SOLN
INTRAMUSCULAR | Status: DC | PRN
  Administered 2024-07-27 (×2): 100 ug via INTRAVENOUS

## 2024-07-27 MED ORDER — CHLORHEXIDINE GLUCONATE CLOTH 2 % EX PADS: 6.0000 | MEDICATED_PAD | Freq: Once | CUTANEOUS | Status: DC

## 2024-07-27 MED ORDER — SUGAMMADEX SODIUM 200 MG/2ML IV SOLN
INTRAVENOUS | Status: DC | PRN
  Administered 2024-07-27: 200 mg via INTRAVENOUS

## 2024-07-27 MED ORDER — OXYCODONE HCL 5 MG PO TABS
ORAL_TABLET | ORAL | Status: AC
  Filled 2024-07-27: qty 1

## 2024-07-27 MED ORDER — OXYCODONE HCL 5 MG PO TABS
5.0000 mg | ORAL_TABLET | Freq: Once | ORAL | Status: AC | PRN
  Administered 2024-07-27: 5 mg via ORAL

## 2024-07-27 MED ORDER — DEXAMETHASONE SODIUM PHOSPHATE 10 MG/ML IJ SOLN
INTRAMUSCULAR | Status: AC
  Filled 2024-07-27: qty 1

## 2024-07-27 MED ORDER — ENOXAPARIN SODIUM 40 MG/0.4ML IJ SOSY: 40.0000 mg | PREFILLED_SYRINGE | Freq: Once | INTRAMUSCULAR | Status: AC

## 2024-07-27 MED ORDER — CHLORHEXIDINE GLUCONATE 0.12 % MT SOLN: 15.0000 mL | Freq: Once | OROMUCOSAL | Status: AC

## 2024-07-27 MED ORDER — FENTANYL CITRATE (PF) 250 MCG/5ML IJ SOLN
INTRAMUSCULAR | Status: AC
  Filled 2024-07-27: qty 5

## 2024-07-27 MED ORDER — ROCURONIUM BROMIDE 10 MG/ML (PF) SYRINGE
PREFILLED_SYRINGE | INTRAVENOUS | Status: DC | PRN
  Administered 2024-07-27: 10 mg via INTRAVENOUS
  Administered 2024-07-27: 60 mg via INTRAVENOUS

## 2024-07-27 MED ORDER — 0.9 % SODIUM CHLORIDE (POUR BTL) OPTIME
TOPICAL | Status: DC | PRN
  Administered 2024-07-27: 1000 mL

## 2024-07-27 MED ORDER — BUPIVACAINE-EPINEPHRINE 0.25% -1:200000 IJ SOLN
INTRAMUSCULAR | Status: DC | PRN
  Administered 2024-07-27: 30 mL

## 2024-07-27 MED ORDER — CEFAZOLIN SODIUM-DEXTROSE 2-4 GM/100ML-% IV SOLN
INTRAVENOUS | Status: AC
  Filled 2024-07-27: qty 100

## 2024-07-27 MED ORDER — ACETAMINOPHEN 500 MG PO TABS: 1000.0000 mg | ORAL_TABLET | ORAL | Status: AC

## 2024-07-27 MED ORDER — DEXAMETHASONE SODIUM PHOSPHATE 10 MG/ML IJ SOLN
INTRAMUSCULAR | Status: DC | PRN
  Administered 2024-07-27: 10 mg via INTRAVENOUS

## 2024-07-27 MED ORDER — EPHEDRINE SULFATE-NACL 50-0.9 MG/10ML-% IV SOSY
PREFILLED_SYRINGE | INTRAVENOUS | Status: DC | PRN
  Administered 2024-07-27: 5 mg via INTRAVENOUS

## 2024-07-27 MED ORDER — LIDOCAINE 2% (20 MG/ML) 5 ML SYRINGE
INTRAMUSCULAR | Status: AC
Start: 2024-07-27 — End: 2024-07-27
  Filled 2024-07-27: qty 5

## 2024-07-27 SURGICAL SUPPLY — 24 items
BLADE CLIPPER SURG (BLADE) IMPLANT
CHLORAPREP W/TINT 26 (MISCELLANEOUS) ×1 IMPLANT
COVER SURGICAL LIGHT HANDLE (MISCELLANEOUS) ×1 IMPLANT
DERMABOND ADVANCED .7 DNX12 (GAUZE/BANDAGES/DRESSINGS) ×1 IMPLANT
DRAPE LAPAROTOMY 100X72 PEDS (DRAPES) ×1 IMPLANT
ELECTRODE REM PT RTRN 9FT ADLT (ELECTROSURGICAL) ×1 IMPLANT
GLOVE BIOGEL PI IND STRL 6 (GLOVE) ×1 IMPLANT
GLOVE BIOGEL PI MICRO STRL 5.5 (GLOVE) ×1 IMPLANT
GOWN STRL REUS W/ TWL LRG LVL3 (GOWN DISPOSABLE) ×2 IMPLANT
KIT BASIN OR (CUSTOM PROCEDURE TRAY) ×1 IMPLANT
KIT TURNOVER KIT B (KITS) ×1 IMPLANT
MANIFOLD NEPTUNE II (INSTRUMENTS) IMPLANT
NDL HYPO 25GX1X1/2 BEV (NEEDLE) ×1 IMPLANT
NEEDLE HYPO 25GX1X1/2 BEV (NEEDLE) ×1 IMPLANT
NS IRRIG 1000ML POUR BTL (IV SOLUTION) ×1 IMPLANT
PACK GENERAL/GYN (CUSTOM PROCEDURE TRAY) ×1 IMPLANT
PAD ARMBOARD POSITIONER FOAM (MISCELLANEOUS) ×1 IMPLANT
SUT MNCRL AB 4-0 PS2 18 (SUTURE) ×1 IMPLANT
SUT PDS AB 0 CT1 27 (SUTURE) IMPLANT
SUT VIC AB 3-0 SH 18 (SUTURE) IMPLANT
SUT VIC AB 3-0 SH 27X BRD (SUTURE) ×1 IMPLANT
SYR CONTROL 10ML LL (SYRINGE) ×1 IMPLANT
TOWEL GREEN STERILE (TOWEL DISPOSABLE) ×1 IMPLANT
TOWEL GREEN STERILE FF (TOWEL DISPOSABLE) ×1 IMPLANT

## 2024-07-27 NOTE — Anesthesia Procedure Notes (Signed)
 Procedure Name: Intubation Date/Time: 07/27/2024 10:00 AM  Performed by: Delores Duwaine SAUNDERS, CRNAPre-anesthesia Checklist: Patient identified, Emergency Drugs available, Suction available and Patient being monitored Patient Re-evaluated:Patient Re-evaluated prior to induction Oxygen Delivery Method: Circle System Utilized Preoxygenation: Pre-oxygenation with 100% oxygen Induction Type: IV induction Ventilation: Mask ventilation without difficulty Laryngoscope Size: Glidescope and 4 Grade View: Grade I Tube type: Oral Number of attempts: 1 Airway Equipment and Method: Stylet and Oral airway Placement Confirmation: ETT inserted through vocal cords under direct vision, positive ETCO2 and breath sounds checked- equal and bilateral Secured at: 22 cm Tube secured with: Tape Dental Injury: Teeth and Oropharynx as per pre-operative assessment

## 2024-07-27 NOTE — Anesthesia Postprocedure Evaluation (Signed)
 Anesthesia Post Note  Patient: Jeffrey Price  Procedure(s) Performed: UMBILICAL AND SUPRAUBILICAL HERNIA REPAIRS (Abdomen)     Patient location during evaluation: PACU Anesthesia Type: General Level of consciousness: awake and alert Pain management: pain level controlled Vital Signs Assessment: post-procedure vital signs reviewed and stable Respiratory status: spontaneous breathing, nonlabored ventilation, respiratory function stable and patient connected to nasal cannula oxygen Cardiovascular status: blood pressure returned to baseline and stable Postop Assessment: no apparent nausea or vomiting Anesthetic complications: no   No notable events documented.  Last Vitals:  Vitals:   07/27/24 1215 07/27/24 1230  BP: 125/68 121/69  Pulse: 69 68  Resp: 20 11  Temp: 36.5 C   SpO2: 95% 92%    Last Pain:  Vitals:   07/27/24 1200  TempSrc:   PainSc: 3                  Thom JONELLE Peoples

## 2024-07-27 NOTE — Telephone Encounter (Signed)
 Prescription refill request for Eliquis  received. Indication:afib Last office visit:5/25 Scr:0.98  9/25 Age: 68 Weight:108.7  kg  Prescription refilled

## 2024-07-27 NOTE — Interval H&P Note (Signed)
 History and Physical Interval Note:  07/27/2024 9:16 AM  Jeffrey Price  has presented today for surgery, with the diagnosis of UMBILICAL AND SUPRAUMBILICAL HERNIAS.  The various methods of treatment have been discussed with the patient and family. After consideration of risks, benefits and other options for treatment, the patient has consented to  Procedure(s) with comments: REPAIR, HERNIA, UMBILICAL, ADULT (N/A) - umbilical and supraumbilical hernia repair with mesh (open) as a surgical intervention.  The patient's history has been reviewed, patient examined, no change in status, stable for surgery.  I have reviewed the patient's chart and labs.  Questions were answered to the patient's satisfaction.     Richerd Silversmith

## 2024-07-27 NOTE — Transfer of Care (Signed)
 Immediate Anesthesia Transfer of Care Note  Patient: Jeffrey Price  Procedure(s) Performed: UMBILICAL AND SUPRAUBILICAL HERNIA REPAIRS (Abdomen)  Patient Location: PACU  Anesthesia Type:General  Level of Consciousness: awake, alert , and oriented  Airway & Oxygen Therapy: Patient connected to face mask oxygen  Post-op Assessment: Post -op Vital signs reviewed and stable  Post vital signs: stable  Last Vitals:  Vitals Value Taken Time  BP 126/77 07/27/24 11:45  Temp    Pulse 70 07/27/24 11:45  Resp 14 07/27/24 11:45  SpO2 93 % 07/27/24 11:45  Vitals shown include unfiled device data.  Last Pain:  Vitals:   07/27/24 0828  TempSrc:   PainSc: 0-No pain         Complications: No notable events documented.

## 2024-07-27 NOTE — Op Note (Addendum)
 Post-Op Note/Post-Procedure Note  Patient: Jeffrey Price MRN: [id number] DOB: 10/01/1956 Sex: male Operation/Procedure Date: 07/27/2024 Surgeons and Role:    DEWAINE Silversmith, Richerd, MD - Primary Pre-operative Diagnoses: UMBILICAL AND SUPRAUMBILICAL HERNIAS Postoperative Diagnoses: * No post-op diagnosis entered * Procedure:    UMBILICAL AND SUPRAUBILICAL HERNIA REPAIRS CPT(R) Code:  50408 - PR RPR AA HERNIA 1ST < 3 CM REDUCIBLE   Anesthesia: General endotracheal anesthesia  Indications: Jeffrey Price is a 68 y.o. year old male who presents for umbilical hernia repair. Preoperatively, I discussed in detail the risks, benefits, alternatives, and potential complications. The patient understands and requests to proceed.  Operative Findings: Umbilical hernia with fascial defect of 0.5cm (craniocaudal) x 1cm (transverse). Supraumbilical hernia with fascial defect of 1cm (craniocaudal) x 1.5cm (transverse  Technique: The patient was positively identified and was taken to the operating room and placed supine on the operating table. A time-out was performed confirming correct patient and procedure. We also confirmed initiation of deep venous thrombosis prophylaxis and wound prophylaxis. After successful induction of general endotracheal anesthesia, the arms were carefully tucked and padded. The abdomen was prepped and draped in the usual sterile surgical fashion. We began by making a vertical supraumbilical incision extending curvilinear inferior to the umbilicus. We soon encountered the hernia sac of supraumbilical hernia. The hernia sac was dissected free from the subcutaneous attachments circumferentially down to the rim of the fascial defect. The hernia sac was violated so we examined the contents--omentum. There was some hemorrhagic omentum that we resected. We then reduced remaining omentum. The defect was measured to be 1x1.5cm so decision made not to use mesh. We closed the fascia using interrupted  figure of 8 suture with 0 PDS. We then turned our attention to the umbilical hernia addressed in the same fashion. Some hemorrhagic omentum was resected there as well. Remaining contents reduced. This defect measured 0.5x1cm so also closed primarily with interrupted figureof 8 PDS. We then approximated the redundant skin at the umbilicus to the anterior fascia using 3-0 Vicryl suture in a cosmetically appealing fashion. Hemostasis was observed. We then placed 0.25% Marcaine  with epi in the subcutaneous tissues. The skin was closed using interrupted 3-0 Vicryl deep dermal sutures followed by a running 4-0 Monocryl subcuticular suture. Dermabond was applied.   The patient tolerated the procedure well and was extubated and taken to recovery room in good condition. All sponge, needle, and instrument counts were reported correct at the end of the case.  Estimated Blood Loss: Minimal. Specimens: Supraumbilical hernia contents, umbilical hernia contents Implants: None Drains: None. Complications: None Condition of the patient: Good, extubated Disposition: PACU   [name], MD [facility]

## 2024-07-28 ENCOUNTER — Encounter (HOSPITAL_COMMUNITY): Payer: Self-pay | Admitting: General Surgery

## 2024-07-29 ENCOUNTER — Other Ambulatory Visit: Payer: Self-pay | Admitting: Neurology

## 2024-07-30 LAB — SURGICAL PATHOLOGY

## 2024-08-06 ENCOUNTER — Other Ambulatory Visit: Payer: Self-pay | Admitting: Adult Health

## 2024-08-06 DIAGNOSIS — B354 Tinea corporis: Secondary | ICD-10-CM

## 2024-08-08 ENCOUNTER — Telehealth: Payer: Self-pay | Admitting: *Deleted

## 2024-08-08 NOTE — Telephone Encounter (Signed)
 Copied from CRM #[id number]. Topic: Clinical - Medication Prior Auth >> Aug 08, 2024  2:55 PM Frederich PARAS wrote: Reason for CRM:  Cj[place]otte calling about pt apt 10/07 she says the authorization needs to be extended ,  [phone]  ext [phone] [place]

## 2024-08-09 NOTE — Telephone Encounter (Signed)
 Spoke to [facility] new order has been placed!

## 2024-08-09 NOTE — Addendum Note (Signed)
 Addended by: VICCI LEADER R on: 08/09/2024 04:34 PM   Modules accepted: Orders

## 2024-08-10 ENCOUNTER — Other Ambulatory Visit: Payer: Self-pay | Admitting: Adult Health

## 2024-08-10 NOTE — Telephone Encounter (Unsigned)
 Copied from CRM [id number]. Topic: Referral - Prior Authorization Question >> Aug 09, 2024  3:05 PM Pinkey ORN wrote: Reason for CRM: Prior Authorization >> Aug 09, 2024  3:08 PM Pinkey ORN wrote: [facility] Fax # [phone] / [phone] Called on behalf of patient's CT chest scan. States they faxed over and requested a prior authorization and hasn't heard anything as of yet. Patient is scheduled for Oct 7th and is wanting to have this in before his appointment if it's required.

## 2024-08-13 ENCOUNTER — Encounter: Payer: Self-pay | Admitting: Internal Medicine

## 2024-08-14 ENCOUNTER — Inpatient Hospital Stay: Admission: RE | Admit: 2024-08-14 | Payer: Medicare (Managed Care) | Source: Ambulatory Visit

## 2024-08-14 MED ORDER — EMPAGLIFLOZIN 25 MG PO TABS: 25.0000 mg | ORAL_TABLET | Freq: Every day | ORAL | 3 refills | Status: AC

## 2024-08-15 ENCOUNTER — Other Ambulatory Visit: Payer: Medicare (Managed Care)

## 2024-08-20 ENCOUNTER — Encounter: Payer: Self-pay | Admitting: Adult Health

## 2024-08-21 ENCOUNTER — Other Ambulatory Visit: Payer: Self-pay | Admitting: Adult Health

## 2024-08-22 ENCOUNTER — Other Ambulatory Visit: Payer: Self-pay | Admitting: Adult Health

## 2024-08-22 DIAGNOSIS — K219 Gastro-esophageal reflux disease without esophagitis: Secondary | ICD-10-CM

## 2024-08-22 DIAGNOSIS — Z4889 Encounter for other specified surgical aftercare: Secondary | ICD-10-CM | POA: Diagnosis not present

## 2024-08-22 DIAGNOSIS — Z8719 Personal history of other diseases of the digestive system: Secondary | ICD-10-CM | POA: Diagnosis not present

## 2024-08-24 ENCOUNTER — Other Ambulatory Visit: Payer: Self-pay | Admitting: Neurology

## 2024-08-26 ENCOUNTER — Other Ambulatory Visit: Payer: Self-pay | Admitting: Neurology

## 2024-08-26 ENCOUNTER — Ambulatory Visit: Payer: Self-pay | Admitting: Neurology

## 2024-08-26 DIAGNOSIS — I480 Paroxysmal atrial fibrillation: Secondary | ICD-10-CM

## 2024-08-26 DIAGNOSIS — M50022 Cervical disc disorder at C5-C6 level with myelopathy: Secondary | ICD-10-CM

## 2024-08-26 DIAGNOSIS — I2510 Atherosclerotic heart disease of native coronary artery without angina pectoris: Secondary | ICD-10-CM

## 2024-08-26 DIAGNOSIS — I7121 Aneurysm of the ascending aorta, without rupture: Secondary | ICD-10-CM

## 2024-08-26 DIAGNOSIS — J439 Emphysema, unspecified: Secondary | ICD-10-CM

## 2024-08-26 DIAGNOSIS — G4733 Obstructive sleep apnea (adult) (pediatric): Secondary | ICD-10-CM

## 2024-08-26 DIAGNOSIS — E1142 Type 2 diabetes mellitus with diabetic polyneuropathy: Secondary | ICD-10-CM
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
## 2024-08-29 ENCOUNTER — Telehealth: Payer: Self-pay

## 2024-08-29 ENCOUNTER — Other Ambulatory Visit: Payer: Self-pay | Admitting: *Deleted

## 2024-08-29 DIAGNOSIS — Z9989 Dependence on other enabling machines and devices: Secondary | ICD-10-CM

## 2024-08-29 DIAGNOSIS — G4733 Obstructive sleep apnea (adult) (pediatric): Secondary | ICD-10-CM

## 2024-08-29 NOTE — Telephone Encounter (Signed)
 Left message for Jeffrey Price to return phone call.

## 2024-08-29 NOTE — Telephone Encounter (Signed)
 Copied from CRM [id number]. Topic: Clinical - Request for Lab/Test Order >> Aug 29, 2024 12:19 PM Robinson H wrote: Reason for CRM: Roselie calling to have patients imaging order extended for the CT w/out Contrast CPT code 28749  [facility] [phone] Ext [phone]

## 2024-08-29 NOTE — Telephone Encounter (Signed)
-----   Message from Shamokin Dohmeier sent at 08/26/2024  2:55 PM EDT ----- Moderate sleep apnea, mild - moderate sleep hypoxia, mostly position dependent.   I would prefer him on ResMed auto-CPAP.  7-16 cm water , 2 cm EPR.  ----- Message ----- From: Chalice Saunas, MD Sent: 08/26/2024   2:51 PM EDT To: Saunas Chalice, MD

## 2024-08-29 NOTE — Telephone Encounter (Signed)
 RE: pressure changes  (made in airview already)  FYI Received: Today New, Adine Neysa Nena GORMAN, RN; Joylene Carlean Sheree Leveda Jackson Avelina; Tucker, Dolanda; 1 other Received, Thank you!     Previous Messages    ----- Message ----- From: Neysa Nena GORMAN, RN Sent: 08/29/2024   1:38 PM EDT To: Adine Joylene; Avelina Jackson; Ephraim Dollar* Subject: pressure changes  (made in airview already) *  Good day,  May changes in airview on new orders for pt.  Lynwood JUDITHANN Kendall Male, 68 y.o., 1956/03/30 Pronouns: he/him/his MRN: [id number] Phone: [phone] (H)   Thanks  Particia SAUNDERS

## 2024-08-29 NOTE — Telephone Encounter (Signed)
 I called pt and relayed that his most recent sleep study results 07-17-2024, showed moderate OSA, mild-moderate hypoxia, mostly dependent position.  Dr. Chalice would like on autopap 7-16cm water and 2cm EPR.  Changes made in airview.  Pt to let us  know how he does.  He verbalized understanding.

## 2024-08-31 ENCOUNTER — Other Ambulatory Visit: Payer: Self-pay | Admitting: Adult Health

## 2024-08-31 ENCOUNTER — Telehealth: Payer: Self-pay

## 2024-08-31 DIAGNOSIS — G4733 Obstructive sleep apnea (adult) (pediatric): Secondary | ICD-10-CM | POA: Diagnosis not present

## 2024-08-31 DIAGNOSIS — B354 Tinea corporis: Secondary | ICD-10-CM

## 2024-08-31 NOTE — Telephone Encounter (Signed)
 This has been taking care of and sent to referral coordinator.

## 2024-08-31 NOTE — Telephone Encounter (Signed)
 Copied from CRM [id number]. Topic: Clinical - Medication Prior Auth >> Aug 31, 2024 12:30 PM [place] wrote: Reason for CRM: Needing a new or extended authorization for CT scan at [facility]. Please reach out [phone] [facility]

## 2024-09-03 ENCOUNTER — Other Ambulatory Visit: Payer: Self-pay | Admitting: Internal Medicine

## 2024-09-04 ENCOUNTER — Telehealth: Payer: Self-pay

## 2024-09-04 ENCOUNTER — Encounter: Payer: Self-pay | Admitting: Adult Health

## 2024-09-04 NOTE — Telephone Encounter (Signed)
 This is being taken care of by referral coordinator.

## 2024-09-04 NOTE — Telephone Encounter (Signed)
 Copied from CRM #[id number]. Topic: Clinical - Request for Lab/Test Order >> Sep 04, 2024 10:21 AM Dedra NOVAK wrote: Reason for CRM: Roselie from [facility] called requesting a prior authorization for CT chest w/o contrast. The previous one expired. Roselie can be reached at [phone] ext [phone].

## 2024-09-16 ENCOUNTER — Encounter: Payer: Self-pay | Admitting: Adult Health

## 2024-09-16 ENCOUNTER — Other Ambulatory Visit: Payer: Self-pay | Admitting: Internal Medicine

## 2024-09-16 DIAGNOSIS — E1142 Type 2 diabetes mellitus with diabetic polyneuropathy: Secondary | ICD-10-CM

## 2024-09-17 NOTE — Telephone Encounter (Signed)
 Refill request complete

## 2024-09-25 ENCOUNTER — Other Ambulatory Visit: Payer: Self-pay | Admitting: Adult Health

## 2024-09-25 DIAGNOSIS — R911 Solitary pulmonary nodule: Secondary | ICD-10-CM

## 2024-09-27 ENCOUNTER — Other Ambulatory Visit: Payer: Self-pay | Admitting: Adult Health

## 2024-09-27 DIAGNOSIS — B354 Tinea corporis: Secondary | ICD-10-CM

## 2024-09-27 NOTE — Telephone Encounter (Signed)
 Okay for refill?

## 2024-10-01 ENCOUNTER — Ambulatory Visit
Admission: RE | Admit: 2024-10-01 | Discharge: 2024-10-01 | Disposition: A | Discharge status: Home / Self Care | Payer: Medicare (Managed Care) | Source: Ambulatory Visit | Attending: Adult Health | Admitting: Adult Health

## 2024-10-01 DIAGNOSIS — R911 Solitary pulmonary nodule: Secondary | ICD-10-CM

## 2024-10-01 DIAGNOSIS — J432 Centrilobular emphysema: Secondary | ICD-10-CM | POA: Diagnosis not present

## 2024-10-03 ENCOUNTER — Telehealth: Payer: Self-pay

## 2024-10-03 NOTE — Progress Notes (Signed)
   10/03/2024  Patient ID: Jeffrey Price, male   DOB: 1956-07-19, 68 y.o.   MRN: [id number]  Pharmacy Quality Measure Review  This patient is appearing on a report for being at risk of failing the adherence measure for cholesterol (statin) medications this calendar year.   Medication: Atorvastatin  Last fill date: 09/15/24 for 15 day supply  Patient needs appt with cardio for further refills.  Jon VEAR Lindau, PharmD Clinical Pharmacist [phone]

## 2024-10-07 ENCOUNTER — Other Ambulatory Visit: Payer: Self-pay | Admitting: Adult Health

## 2024-10-07 DIAGNOSIS — E785 Hyperlipidemia, unspecified: Secondary | ICD-10-CM

## 2024-10-09 ENCOUNTER — Other Ambulatory Visit: Payer: Self-pay | Admitting: Adult Health

## 2024-10-09 ENCOUNTER — Ambulatory Visit: Payer: Self-pay | Admitting: Adult Health

## 2024-10-09 DIAGNOSIS — R911 Solitary pulmonary nodule: Secondary | ICD-10-CM

## 2024-10-14 ENCOUNTER — Encounter: Payer: Self-pay | Admitting: Internal Medicine

## 2024-10-14 ENCOUNTER — Other Ambulatory Visit: Payer: Self-pay | Admitting: Adult Health

## 2024-10-14 DIAGNOSIS — E0500 Thyrotoxicosis with diffuse goiter without thyrotoxic crisis or storm: Secondary | ICD-10-CM

## 2024-10-15 MED ORDER — METHIMAZOLE 5 MG PO TABS: 5.0000 mg | ORAL_TABLET | ORAL | 3 refills | Status: AC

## 2024-10-16 ENCOUNTER — Ambulatory Visit: Payer: Medicare (Managed Care) | Admitting: Pulmonary Disease

## 2024-10-16 ENCOUNTER — Encounter: Payer: Self-pay | Admitting: Pulmonary Disease

## 2024-10-16 VITALS — BP 132/64 | HR 78 | Ht 74.0 in | Wt 240.4 lb

## 2024-10-16 DIAGNOSIS — Z87891 Personal history of nicotine dependence: Secondary | ICD-10-CM

## 2024-10-16 DIAGNOSIS — J439 Emphysema, unspecified: Secondary | ICD-10-CM

## 2024-10-16 DIAGNOSIS — R918 Other nonspecific abnormal finding of lung field: Secondary | ICD-10-CM

## 2024-10-16 MED ORDER — BUDESONIDE-FORMOTEROL FUMARATE 160-4.5 MCG/ACT IN AERO: 2.0000 | INHALATION_SPRAY | Freq: Two times a day (BID) | RESPIRATORY_TRACT | 12 refills | Status: AC

## 2024-10-16 NOTE — Assessment & Plan Note (Addendum)
  Orders:   budesonide -formoterol  (BREYNA ) 160-4.5 MCG/ACT inhaler; Inhale 2 puffs into the lungs 2 (two) times daily.

## 2024-10-16 NOTE — Progress Notes (Signed)
 "  New Patient Pulmonology Office Visit   Subjective:  Patient ID: Jeffrey Price, male    DOB: Oct 13, 1956  MRN: [id number]  Referred by: Merna Huxley, NP  CC:  Chief Complaint  Patient presents with   Consult    Pt states lung infection / nods 3?    Discussed the use of AI scribe software for clinical note transcription with the patient, who gave verbal consent to proceed.  History of Present Illness Jeffrey Price is a 68 year old male with emphysema who presents with lung nodules. He was referred for evaluation of lung nodules.  He has had three CT scans for lung nodules with no significant change and some decrease in size. He feels weak for the past 7-10 days after a possible viral illness from his grandchildren and has had a weaker voice for two weeks.  He has shortness of breath with strenuous activity. He uses an albuterol  inhaler a few times without meaningful relief and is unsure about correct use and frequency.  He lost about 50 pounds over the past year to year and a half while on Mounjaro  for diabetes and has decreased appetite. He was not intentionally trying to lose weight. He had diarrhea last week that has resolved.  He smoked about 1.5 packs per day for 35-40 years and quit four years ago. He vaped for a couple of years after quitting but no longer uses any tobacco or vaping products.  He has no fever, chills, sweats, or productive cough. There is no family history of lung cancer. His father had Parkinson's disease.        Review of Systems  Constitutional:  Negative for chills, fever, malaise/fatigue and weight loss.  HENT:  Negative for congestion, sinus pain and sore throat.   Eyes: Negative.   Respiratory:  Positive for shortness of breath. Negative for cough, hemoptysis, sputum production and wheezing.   Cardiovascular:  Negative for chest pain, palpitations, orthopnea, claudication and leg swelling.  Gastrointestinal:  Negative for abdominal pain,  heartburn, nausea and vomiting.  Genitourinary: Negative.   Musculoskeletal:  Negative for joint pain and myalgias.  Skin:  Negative for rash.  Neurological:  Negative for weakness.  Endo/Heme/Allergies: Negative.   Psychiatric/Behavioral: Negative.      Allergies: Patient has no known allergies.  Current Outpatient Medications:    acetaminophen  (TYLENOL ) 325 MG tablet, Take 2 tablets (650 mg total) by mouth every 6 (six) hours as needed for mild pain (or Fever >/= 101)., Disp: , Rfl:    albuterol  (VENTOLIN  HFA) 108 (90 Base) MCG/ACT inhaler, Inhale 2 puffs into the lungs every 6 (six) hours as needed for wheezing or shortness of breath., Disp: 8 g, Rfl: 0   apixaban  (ELIQUIS ) 5 MG TABS tablet, Take 1 tablet by mouth twice daily, Disp: 180 tablet, Rfl: 1   atenolol  (TENORMIN ) 25 MG tablet, Take 1 tablet (25 mg total) by mouth 2 (two) times daily., Disp: 180 tablet, Rfl: 3   Blood Gluc Meter Disp-Strips (BLOOD GLUCOSE METER DISPOSABLE) DEVI, Use to check blood glucose TID, Disp: 100 each, Rfl: 0   blood glucose meter kit and supplies KIT, Dispense based on patient and insurance preference. Use up to four times daily as directed., Disp: 1 each, Rfl: 0   Blood Glucose Monitoring Suppl (FREESTYLE FREEDOM LITE) w/Device KIT, Use to check blood sugar 3 times a day, Disp: 1 kit, Rfl: 0   budesonide -formoterol  (BREYNA ) 160-4.5 MCG/ACT inhaler, Inhale 2 puffs into the lungs 2 (  two) times daily., Disp: 1 each, Rfl: 12   cetirizine (ZYRTEC) 10 MG tablet, Take 10 mg by mouth daily., Disp: , Rfl:    clotrimazole  (CLOTRIMAZOLE  AF) 1 % cream, Apply topically twice daily as needed (rash)., Disp: 45 g, Rfl: 2   clotrimazole -betamethasone  (LOTRISONE ) cream, Apply 1 Application topically 2 (two) times daily., Disp: 30 g, Rfl: 1   Cyanocobalamin  (B-12 SL), Place 1 tablet under the tongue daily., Disp: , Rfl:    empagliflozin  (JARDIANCE ) 25 MG TABS tablet, Take 1 tablet (25 mg total) by mouth daily., Disp: 90  tablet, Rfl: 3   fenofibrate  (TRICOR ) 145 MG tablet, Take 1 tablet by mouth once daily, Disp: 90 tablet, Rfl: 0   folic acid  (FOLVITE ) 1 MG tablet, Take 1 tablet (1 mg total) by mouth daily., Disp: , Rfl:    furosemide  (LASIX ) 80 MG tablet, Take 1 tablet by mouth twice daily as needed, Disp: 180 tablet, Rfl: 0   glucose blood (FREESTYLE LITE) test strip, Use to check blood sugar 3 times a day, Disp: 300 each, Rfl: 12   Lancet Devices MISC, Use to check blood glucose TID, Disp: 100 each, Rfl: 0   Lancets (FREESTYLE) lancets, Use to check blood sugar 3 times a day, Disp: 300 each, Rfl: 12   lidocaine -prilocaine  (EMLA ) cream, APPLY 1 APPLICATION TO BOTH FEET AS NEEDED DAILY, Disp: 30 g, Rfl: 2   metFORMIN  (GLUCOPHAGE -XR) 500 MG 24 hr tablet, Take 1 tablet by mouth once daily with breakfast, Disp: 90 tablet, Rfl: 0   methimazole  (TAPAZOLE ) 5 MG tablet, Take 1 tablet (5 mg total) by mouth every other day., Disp: 45 tablet, Rfl: 3   methocarbamol  (ROBAXIN ) 750 MG tablet, TAKE 1 TABLET BY MOUTH EVERY 8 HOURS AS NEEDED FOR MUSCLE SPASM, Disp: 90 tablet, Rfl: 0   MOUNJARO  7.5 MG/0.5ML Pen, INJECT 7.5 MG SUBCUTANEOUSLY  ONCE A WEEK, Disp: 12 mL, Rfl: 0   oxyCODONE  (ROXICODONE ) 5 MG immediate release tablet, Take 1 tablet (5 mg total) by mouth every 4 (four) hours as needed., Disp: 15 tablet, Rfl: 0   pantoprazole  (PROTONIX ) 40 MG tablet, TAKE 1 TABLET BY MOUTH ONCE DAILY . APPOINTMENT REQUIRED FOR FUTURE REFILLS, Disp: 90 tablet, Rfl: 0   sildenafil  (VIAGRA ) 100 MG tablet, Take 1/2-1 tablet by mouth daily as needed for erectile dysfunction., Disp: 30 tablet, Rfl: 2   tazarotene  (AVAGE ) 0.1 % cream, Apply a thin layer to affected area(s) at nighttime, Disp: 60 g, Rfl: 1   tretinoin  (RETIN-A ) 0.05 % cream, Apply 1 application  topically daily as needed (oil skin)., Disp: 45 g, Rfl: 3   triamcinolone  cream (KENALOG ) 0.1 %, APPLY 1 APPLICATION TOPICALLY 2 (TWO) TIMES DAILY AS NEEDED (IRRITATION), Disp: 15 g, Rfl:  2   atorvastatin  (LIPITOR) 20 MG tablet, Take 1 tablet (20 mg total) by mouth daily. LAST ATTEMPT, PT IS OVERDUE TO SEE DR. NISHAN, PLEASE HAVE PT CALL TO SCHEDULE, Disp: 15 tablet, Rfl: 0   gabapentin  (NEURONTIN ) 800 MG tablet, Take 1 tablet (800 mg total) by mouth 2 (two) times daily., Disp: 120 tablet, Rfl: 2   ketoconazole  (NIZORAL ) 2 % cream, APPLY 1 GRAM TOPICALLY ONCE DAILY, Disp: 30 g, Rfl: 0 Past Medical History:  Diagnosis Date   Arthritis    in back   Atrial fibrillation (HCC)    Blood clot in vein 1995   bil legs   Bulging lumbar disc    Cataract    mild   Cellulitis 2012  forehead   Complication of anesthesia    had trouble waking up 06/11/2020   DDD (degenerative disc disease), lumbar    Diabetes mellitus without complication (HCC)    Dysrhythmia    A-fib   GERD (gastroesophageal reflux disease)    Graves disease    Hx of adenomatous colonic polyps 12/15/2017   Hyperlipidemia    Hypertension    Neuromuscular disorder (HCC)    neuropathy in feet   OSA on CPAP    wears CPAP sometimes   Sleep apnea    Thyrotoxicosis without thyroid  storm    Varicose veins of both lower extremities    Past Surgical History:  Procedure Laterality Date   ANTERIOR CERVICAL DECOMP/DISCECTOMY FUSION N/A 06/18/2021   Procedure: ANTERIOR CERVICAL DECOMPRESSION FUSION CERVICAL4- CERVICAL 5 WITH INSTRUEMENTATION AND ALLOGRAFT;  Surgeon: Beuford Anes, MD;  Location: [facility] OR;  Service: Orthopedics;  Laterality: N/A;   ATRIAL FIBRILLATION ABLATION N/A 09/14/2022   Procedure: ATRIAL FIBRILLATION ABLATION;  Surgeon: Inocencio Soyla Lunger, MD;  Location: [facility] INVASIVE CV LAB;  Service: Cardiovascular;  Laterality: N/A;   BACK SURGERY  06/11/2020   BUBBLE STUDY  09/10/2022   Procedure: BUBBLE STUDY;  Surgeon: Santo Stanly LABOR, MD;  Location: [facility] ENDOSCOPY;  Service: Cardiovascular;;   CARDIOVERSION N/A 12/22/2021   Procedure: CARDIOVERSION;  Surgeon: Delford Maude BROCKS, MD;  Location: [facility]  ENDOSCOPY;  Service: Cardiovascular;  Laterality: N/A;   CARDIOVERSION N/A 10/18/2022   Procedure: CARDIOVERSION;  Surgeon: Loni Soyla LABOR, MD;  Location: [facility] ENDOSCOPY;  Service: Cardiovascular;  Laterality: N/A;   ENDOVENOUS ABLATION SAPHENOUS VEIN W/ LASER Left 10/18/2019   endovenous laser ablation left greater saphenous vein and stab phlebectomy > 20 incisions left leg by Medford Blade MD    ENDOVENOUS ABLATION SAPHENOUS VEIN W/ LASER Right 11/08/2019   endovenous laser ablation right greater saphenous vein and stab phlebectomy 10-20 incisions right leg by Medford Blade MD    LUMBAR EPIDURAL INJECTION     SPINE SURGERY     TEE WITHOUT CARDIOVERSION N/A 12/22/2021   Procedure: TRANSESOPHAGEAL ECHOCARDIOGRAM (TEE);  Surgeon: Delford Maude BROCKS, MD;  Location: [facility] ENDOSCOPY;  Service: Cardiovascular;  Laterality: N/A;   TEE WITHOUT CARDIOVERSION N/A 09/10/2022   Procedure: TRANSESOPHAGEAL ECHOCARDIOGRAM (TEE);  Surgeon: Santo Stanly LABOR, MD;  Location: [facility] ENDOSCOPY;  Service: Cardiovascular;  Laterality: N/A;   UMBILICAL HERNIA REPAIR N/A 07/27/2024   Procedure: UMBILICAL AND SUPRAUBILICAL HERNIA REPAIRS;  Surgeon: Ann Fine, MD;  Location: [facility] OR;  Service: General;  Laterality: N/A;   Family History  Problem Relation Age of Onset   Thyroid  disease Mother    Parkinson's disease Father    Colon cancer Neg Hx    Rectal cancer Neg Hx    Stomach cancer Neg Hx    Esophageal cancer Neg Hx    Social History   Socioeconomic History   Marital status: Significant Other    Spouse name: Not on file   Number of children: 1   Years of education: Not on file   Highest education level: 12th grade  Occupational History   Occupation: Retiring  Tobacco Use   Smoking status: Former    Current packs/day: 0.00    Types: E-cigarettes, Cigarettes    Quit date: 12/03/2011    Years since quitting: 12.9   Smokeless tobacco: Never   Tobacco comments:    Former smoker 10/12/22  Vaping Use    Vaping status: Former   Substances: Nicotine , Flavoring  Substance and Sexual Activity   Alcohol  use: Yes    Comment: whiskey, Burbon, as few drinks per night and more on weekends 10/12/22   Drug use: No   Sexual activity: Not on file  Other Topics Concern   Not on file  Social History Narrative   Patient is widowed.    He has girlfriend.   Right-handed.   Caffeine use: 2-3 cups per day.      Social Drivers of Health   Tobacco Use: Medium Risk (10/16/2024)   Patient History    Smoking Tobacco Use: Former    Smokeless Tobacco Use: Never    Passive Exposure: Not on file  Financial Resource Strain: Low Risk (05/22/2024)   Overall Financial Resource Strain (CARDIA)    Difficulty of Paying Living Expenses: Not hard at all  Food Insecurity: No Food Insecurity (06/07/2024)   Epic    Worried About Programme Researcher, Broadcasting/film/video in the Last Year: Never true    Ran Out of Food in the Last Year: Never true  Transportation Needs: No Transportation Needs (06/07/2024)   Epic    Lack of Transportation (Medical): No    Lack of Transportation (Non-Medical): No  Physical Activity: Insufficiently Active (05/22/2024)   Exercise Vital Sign    Days of Exercise per Week: 5 days    Minutes of Exercise per Session: 20 min  Stress: No Stress Concern Present (05/22/2024)   [employer] of Occupational Health - Occupational Stress Questionnaire    Feeling of Stress: Only a little  Social Connections: Moderately Integrated (05/22/2024)   Social Connection and Isolation Panel    Frequency of Communication with Friends and Family: More than three times a week    Frequency of Social Gatherings with Friends and Family: Twice a week    Attends Religious Services: 1 to 4 times per year    Active Member of [employer] or Organizations: No    Attends Engineer, Structural: Not on file    Marital Status: Living with partner  Intimate Partner Violence: Not At Risk (06/07/2024)   Epic    Fear of Current or  Ex-Partner: No    Emotionally Abused: No    Physically Abused: No    Sexually Abused: No  Depression (PHQ2-9): Low Risk (12/19/2023)   Depression (PHQ2-9)    PHQ-2 Score: 0  Alcohol Screen: Medium Risk (05/22/2024)   Alcohol Screen    Last Alcohol Screening Score (AUDIT): 12  Housing: Unknown (06/07/2024)   Epic    Unable to Pay for Housing in the Last Year: Not on file    Number of Times Moved in the Last Year: 0    Homeless in the Last Year: No  Utilities: Not At Risk (06/07/2024)   Epic    Threatened with loss of utilities: No  Health Literacy: Adequate Health Literacy (12/19/2023)   B1300 Health Literacy    Frequency of need for help with medical instructions: Never       Objective:  BP 132/64   Pulse 78   Ht 6' 2 (1.88 m) Comment: per pt  Wt 240 lb 6.4 oz (109 kg)   SpO2 97%   BMI 30.87 kg/m  Wt Readings from Last 3 Encounters:  10/16/24 240 lb 6.4 oz (109 kg)  07/27/24 240 lb (108.9 kg)  07/25/24 239 lb 11.2 oz (108.7 kg)   BMI Readings from Last 3 Encounters:  10/16/24 30.87 kg/m  07/27/24 30.81 kg/m  07/25/24 30.78 kg/m    Physical Exam Constitutional:      General:  He is not in acute distress.    Appearance: Normal appearance.  Eyes:     General: No scleral icterus.    Conjunctiva/sclera: Conjunctivae normal.  Cardiovascular:     Rate and Rhythm: Normal rate and regular rhythm.  Pulmonary:     Breath sounds: No wheezing, rhonchi or rales.  Musculoskeletal:     Right lower leg: No edema.     Left lower leg: No edema.  Skin:    General: Skin is warm and dry.  Neurological:     General: No focal deficit present.     Diagnostic Review:  Last CBC Lab Results  Component Value Date   WBC 8.5 07/25/2024   HGB 14.8 07/25/2024   HCT 44.7 07/25/2024   MCV 88.0 07/25/2024   MCH 29.1 07/25/2024   RDW 14.3 07/25/2024   PLT 279 07/25/2024   Last metabolic panel Lab Results  Component Value Date   GLUCOSE 108 (H) 07/25/2024   NA 139 07/25/2024    K 3.4 (L) 07/25/2024   CL 99 07/25/2024   CO2 29 07/25/2024   BUN 9 07/25/2024   CREATININE 0.98 07/25/2024   GFRNONAA >60 07/25/2024   CALCIUM  9.4 07/25/2024   PHOS 2.9 03/31/2018   PROT 7.8 10/14/2023   ALBUMIN 4.4 10/14/2023   LABGLOB 3.0 11/29/2018   AGRATIO 1.5 11/29/2018   BILITOT 0.6 10/14/2023   ALKPHOS 105 10/14/2023   AST 39 (H) 10/14/2023   ALT 46 10/14/2023   ANIONGAP 11 07/25/2024    CT Chest 10/01/24 1. Areas of nodularity and nodular consolidation in the upper lobes, stable from 05/08/2024, but with a waxing and waning appearance from 03/01/2024. A chronic atypical or fungal infectious process is favored. Additional follow-up CT chest without contrast in 3-6 months is recommended as malignancy cannot be definitively excluded. 2. 4 mm right lower lobe nodule, stable. Recommend attention on follow-up. 3. Aortic atherosclerosis (ICD10-I70.0). Coronary artery calcification. 4. Emphysema (ICD10-J43.9). Low-dose CT lung cancer screening is recommended for patients who are 90-32 years of age with a 20+ pack-year history of smoking and who are currently smoking or quit <=15 years ago.     Assessment & Plan:   Assessment & Plan Lung nodules  Orders:   CT Chest Wo Contrast; Future  Pulmonary emphysema (HCC)  Orders:   budesonide -formoterol  (BREYNA ) 160-4.5 MCG/ACT inhaler; Inhale 2 puffs into the lungs 2 (two) times daily.   Assessment and Plan Assessment & Plan Lung nodules Nodules decreased in size, atypical for malignancy. Differential includes infection or mucus collection. No symptoms suggestive of malignancy. Previous CT scans without contrast; next scan with contrast planned. - Ordered CT scan with contrast in February. - Scheduled follow-up visit to review CT scan results.  Pulmonary emphysema Intermittent dyspnea on exertion. Previous albuterol  inhaler ineffective. Significant smoking history, quit 4 years ago. No current cough or sputum  production. - Prescribed Brain inhaler, two puffs twice daily. - Instructed to rinse mouth after each use.      Return in about 11 weeks (around 01/01/2025) for f/u visit Dr. Kara.   Dorn KATHEE Kara, MD "

## 2024-10-16 NOTE — Patient Instructions (Addendum)
 We will schedule you for CT Chest scan in February to follow up on these lung nodules  Try breyna  inhaler 1-2 puffs every 4-6 hours as needed - rinse mouth out after each use  Use albuterol  inhaler 1-2 puffs every 4-6 hours as needed  Follow up in February after CT Chest scan

## 2024-10-19 ENCOUNTER — Telehealth: Payer: Self-pay | Admitting: Cardiovascular Disease

## 2024-10-19 ENCOUNTER — Other Ambulatory Visit: Payer: Self-pay | Admitting: Neurology

## 2024-10-19 ENCOUNTER — Other Ambulatory Visit (HOSPITAL_BASED_OUTPATIENT_CLINIC_OR_DEPARTMENT_OTHER): Payer: Self-pay | Admitting: Cardiovascular Disease

## 2024-10-19 ENCOUNTER — Encounter: Payer: Self-pay | Admitting: Neurology

## 2024-10-19 DIAGNOSIS — E785 Hyperlipidemia, unspecified: Secondary | ICD-10-CM

## 2024-10-19 NOTE — Telephone Encounter (Signed)
°*  STAT* If patient is at the pharmacy, call can be transferred to refill team.   1. Which medications need to be refilled? (please list name of each medication and dose if known) atorvastatin (LIPITOR) 20 MG tablet  2. Which pharmacy/location (including street and city if local pharmacy) is medication to be sent to?  [facility] - [place], [place] - [address]  3. Do they need a 30 day or 90 day supply? 90   

## 2024-10-22 MED ORDER — ATORVASTATIN CALCIUM 20 MG PO TABS: 20.0000 mg | ORAL_TABLET | Freq: Every day | ORAL | 0 refills | Status: DC

## 2024-10-22 NOTE — Telephone Encounter (Signed)
 15 day supply has been sent, asking pt to call and make follow-up appointment with Dr. Nishan

## 2024-10-23 MED ORDER — GABAPENTIN 800 MG PO TABS: 800.0000 mg | ORAL_TABLET | Freq: Two times a day (BID) | ORAL | 2 refills | Status: AC

## 2024-10-23 NOTE — Telephone Encounter (Signed)
 Rx sent.

## 2024-10-24 ENCOUNTER — Other Ambulatory Visit: Payer: Self-pay | Admitting: Cardiovascular Disease

## 2024-10-24 ENCOUNTER — Other Ambulatory Visit: Payer: Self-pay | Admitting: Adult Health

## 2024-10-24 DIAGNOSIS — E785 Hyperlipidemia, unspecified: Secondary | ICD-10-CM

## 2024-10-24 DIAGNOSIS — B354 Tinea corporis: Secondary | ICD-10-CM

## 2024-10-26 ENCOUNTER — Encounter: Payer: Self-pay | Admitting: Pulmonary Disease

## 2024-11-24 ENCOUNTER — Other Ambulatory Visit: Payer: Self-pay | Admitting: Adult Health

## 2024-11-24 ENCOUNTER — Other Ambulatory Visit: Payer: Self-pay | Admitting: Cardiovascular Disease

## 2024-11-24 DIAGNOSIS — E785 Hyperlipidemia, unspecified: Secondary | ICD-10-CM

## 2024-11-24 DIAGNOSIS — K219 Gastro-esophageal reflux disease without esophagitis: Secondary | ICD-10-CM

## 2024-11-27 ENCOUNTER — Encounter: Payer: Self-pay | Admitting: Adult Health

## 2024-11-27 DIAGNOSIS — K219 Gastro-esophageal reflux disease without esophagitis: Secondary | ICD-10-CM

## 2024-11-27 NOTE — Telephone Encounter (Signed)
 Pt of Dr. Delford. Passed 3rd attempt. Does Dr. Delford want to refill? Please advise.

## 2024-11-28 MED ORDER — PANTOPRAZOLE SODIUM 40 MG PO TBEC: 40.0000 mg | DELAYED_RELEASE_TABLET | Freq: Every day | ORAL | 0 refills | Status: AC

## 2024-11-28 MED ORDER — FUROSEMIDE 80 MG PO TABS: 80.0000 mg | ORAL_TABLET | Freq: Two times a day (BID) | ORAL | 0 refills | Status: AC | PRN

## 2024-12-04 ENCOUNTER — Other Ambulatory Visit: Payer: Self-pay | Admitting: Adult Health

## 2024-12-04 DIAGNOSIS — B354 Tinea corporis: Secondary | ICD-10-CM

## 2024-12-10 ENCOUNTER — Other Ambulatory Visit: Payer: Self-pay | Admitting: Cardiovascular Disease

## 2024-12-10 DIAGNOSIS — E785 Hyperlipidemia, unspecified: Secondary | ICD-10-CM

## 2024-12-11 ENCOUNTER — Ambulatory Visit
Admission: RE | Admit: 2024-12-11 | Discharge: 2024-12-11 | Disposition: A | Payer: Medicare (Managed Care) | Source: Ambulatory Visit | Attending: Pulmonary Disease

## 2024-12-11 DIAGNOSIS — R918 Other nonspecific abnormal finding of lung field: Secondary | ICD-10-CM

## 2024-12-14 ENCOUNTER — Encounter: Payer: Self-pay | Admitting: Adult Health

## 2024-12-14 DIAGNOSIS — E785 Hyperlipidemia, unspecified: Secondary | ICD-10-CM

## 2024-12-14 NOTE — Telephone Encounter (Signed)
 Please advise.

## 2024-12-18 ENCOUNTER — Ambulatory Visit: Payer: Medicare (Managed Care) | Admitting: Internal Medicine

## 2024-12-24 ENCOUNTER — Ambulatory Visit: Payer: Medicare (Managed Care)

## 2024-12-27 ENCOUNTER — Ambulatory Visit: Payer: Medicare (Managed Care) | Admitting: Pulmonary Disease

## 2025-06-05 ENCOUNTER — Ambulatory Visit: Payer: Medicare (Managed Care) | Admitting: Adult Health
